# Patient Record
Sex: Male | Born: 1945 | Race: White | Hispanic: No | Marital: Married | Smoking: Former smoker
Health system: Southern US, Community
[De-identification: ages and names within clinical notes are randomized; demographics above are authoritative.]

## PROBLEM LIST (undated history)

## (undated) DIAGNOSIS — I2510 Atherosclerotic heart disease of native coronary artery without angina pectoris: Secondary | ICD-10-CM

## (undated) DIAGNOSIS — I272 Pulmonary hypertension, unspecified: Secondary | ICD-10-CM

## (undated) DIAGNOSIS — E663 Overweight: Secondary | ICD-10-CM

## (undated) DIAGNOSIS — E785 Hyperlipidemia, unspecified: Secondary | ICD-10-CM

## (undated) DIAGNOSIS — F411 Generalized anxiety disorder: Secondary | ICD-10-CM

## (undated) HISTORY — PX: SKIN CANCER EXCISION: SHX779

## (undated) HISTORY — DX: Atherosclerotic heart disease of native coronary artery without angina pectoris: I25.10

## (undated) HISTORY — DX: Hyperlipidemia, unspecified: E78.5

## (undated) HISTORY — DX: Overweight: E66.3

## (undated) HISTORY — PX: ANAL FISSURE REPAIR: SHX2312

## (undated) HISTORY — DX: Generalized anxiety disorder: F41.1

---
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
## 2003-09-01 HISTORY — PX: CARDIAC CATHETERIZATION: SHX172

## 2003-12-19 ENCOUNTER — Ambulatory Visit (HOSPITAL_COMMUNITY): Admission: RE | Admit: 2003-12-19 | Discharge: 2003-12-19 | Payer: Self-pay | Admitting: *Deleted

## 2011-09-15 ENCOUNTER — Ambulatory Visit (INDEPENDENT_AMBULATORY_CARE_PROVIDER_SITE_OTHER): Payer: Medicare Other | Admitting: General Surgery

## 2011-09-28 ENCOUNTER — Encounter (INDEPENDENT_AMBULATORY_CARE_PROVIDER_SITE_OTHER): Payer: Self-pay | Admitting: General Surgery

## 2011-09-28 ENCOUNTER — Ambulatory Visit (INDEPENDENT_AMBULATORY_CARE_PROVIDER_SITE_OTHER): Payer: Medicare Other | Admitting: General Surgery

## 2011-09-28 VITALS — BP 150/74 | HR 68 | Temp 98.1°F | Ht 73.0 in | Wt 213.4 lb

## 2011-09-28 DIAGNOSIS — K429 Umbilical hernia without obstruction or gangrene: Secondary | ICD-10-CM

## 2011-09-28 NOTE — Progress Notes (Signed)
Patient ID: [name], male   DOB: 01-Feb-1946, 66 y.o.   MRN: [id number]  Chief Complaint  Patient presents with  . Pre-op Exam    eval umb hernia    HPI [name] is a 66 y.o. male.  Referred by Dr. Melinda Crutch HPI This is a 66 year old male who has about a 1-1/2 year history of an umbilical bulge. This causes him absolutely no symptoms except that is present. He has no discomfort associated with this. It is always out. He has no changes in his bowel movements or any nausea or vomiting either. He comes in to have this evaluated as he is about to go on a cruise in a month and a half and is concerned about that.  Past Medical History  Diagnosis Date  . CAD (coronary artery disease)   . GAD (generalized anxiety disorder)   . Hyperlipidemia   . Overweight     Past Surgical History  Procedure Date  . Tonsillectomy 1955  . Cardiac catheterization 2005  . Anal fissure repair   . Skin cancer excision     Family History  Problem Relation Age of Onset  . Cancer Sister     breast    Social History History  Substance Use Topics  . Smoking status: Former Smoker    Quit date: 09/27/1985  . Smokeless tobacco: Not on file  . Alcohol Use: Yes    Allergies  Allergen Reactions  . Penicillins Hives    Current Outpatient Prescriptions  Medication Sig Dispense Refill  . ALPRAZolam (XANAX) 0.5 MG tablet 0.5 mg as needed. When he travels      . aspirin 81 MG tablet Take 160 mg by mouth daily.      . diphenhydrAMINE (BENADRYL) 50 MG capsule Take 50 mg by mouth as needed.      . fish oil-omega-3 fatty acids 1000 MG capsule Take 2 g by mouth daily.      . Melatonin 10 MG TABS Take 10 mg by mouth daily.      . multivitamin-iron-minerals-folic acid (CENTRUM) chewable tablet Chew 1 tablet by mouth daily.      . simvastatin (ZOCOR) 40 MG tablet Take 40 mg by mouth every evening.        Review of Systems Review of Systems  Constitutional: Negative for fever, chills and unexpected  weight change.  HENT: Negative for hearing loss, congestion, sore throat, trouble swallowing and voice change.   Eyes: Negative for visual disturbance.  Respiratory: Negative for cough and wheezing.   Cardiovascular: Negative for chest pain, palpitations and leg swelling.  Gastrointestinal: Negative for nausea, vomiting, abdominal pain, diarrhea, constipation, blood in stool, abdominal distention, anal bleeding and rectal pain.  Genitourinary: Negative for hematuria and difficulty urinating.  Musculoskeletal: Negative for arthralgias.  Skin: Negative for rash and wound.  Neurological: Negative for seizures, syncope, weakness and headaches.  Hematological: Negative for adenopathy. Does not bruise/bleed easily.  Psychiatric/Behavioral: Negative for confusion.    Blood pressure 150/74, pulse 68, temperature 98.1 F (36.7 C), temperature source Temporal, height _0  (1.854 m), weight 213 lb 6.4 oz (96.798 kg), SpO2 94.00%.  Physical Exam Physical Exam  Vitals reviewed. Constitutional: He appears well-developed and well-nourished.  Neck: Neck supple.  Cardiovascular: Normal rate, regular rhythm and normal heart sounds.   Pulmonary/Chest: Effort normal and breath sounds normal. He has no wheezes. He has no rales.  Abdominal: Soft. There is no tenderness. A hernia (reducible nontender umbilical hernia about 1.5 cm in size)  is present.  Lymphadenopathy:    He has no cervical adenopathy.      Assessment Umbilical hernia    Plan    He has a fairly large umbilical hernia. We discussed the option of observation I recommended repair. He has a fair amount of what is likely preperitoneal fat in this with a smaller hole. I think he certainly is at risk for needing an urgent operation. We also discussed over the long-term this area would likely get bigger and causing more problems. He is very active I think the best plan would be to repair this now. We discussed an open umbilical hernia appear  at likely with mesh. The risks include but are not limited to bleeding, infection, recurrence. He will consider this and will call me back.       Pricilla Moehle 09/28/2011, 4:15 PM

## 2011-09-29 ENCOUNTER — Telehealth (INDEPENDENT_AMBULATORY_CARE_PROVIDER_SITE_OTHER): Payer: Self-pay | Admitting: General Surgery

## 2011-09-29 ENCOUNTER — Other Ambulatory Visit (INDEPENDENT_AMBULATORY_CARE_PROVIDER_SITE_OTHER): Payer: Self-pay | Admitting: General Surgery

## 2011-10-01 ENCOUNTER — Ambulatory Visit (INDEPENDENT_AMBULATORY_CARE_PROVIDER_SITE_OTHER): Payer: Medicare Other | Admitting: General Surgery

## 2011-10-02 ENCOUNTER — Encounter (HOSPITAL_COMMUNITY): Payer: Self-pay | Admitting: Pharmacy Technician

## 2011-10-02 ENCOUNTER — Encounter (HOSPITAL_COMMUNITY): Payer: Self-pay | Admitting: *Deleted

## 2011-10-02 NOTE — Pre-Procedure Instructions (Addendum)
PT INSTRUCTED BETASEPT SHOWER NIGHT BEFORE SURGERY AND AM OF SURGERY--NOT TO USE BETASEPT ON FACE, HEAD OR PRIVATE AREAS--MAY USE USUAL SOAP AND SHAMPOO THOSE AREAS. PT'S EKG REPORT AND CARDIOLOGY OFFICE NOTE 09/28/11 FROM DR. VARANASI --ON THIS CHART.

## 2011-10-05 ENCOUNTER — Telehealth (INDEPENDENT_AMBULATORY_CARE_PROVIDER_SITE_OTHER): Payer: Self-pay

## 2011-10-05 NOTE — Telephone Encounter (Signed)
Patient aware does not need to stop xanax prior to surgery.

## 2011-10-05 NOTE — Telephone Encounter (Signed)
no

## 2011-10-05 NOTE — Telephone Encounter (Signed)
Patient called and said he is having surgery on Wed and is having a bit of anxiety. He took xanax last night and thinks he will probably need to take one tonight. Is there a certain time before Wed Dr. Donne Hazel wants him to stop taking it?

## 2011-10-05 NOTE — Telephone Encounter (Signed)
Called patient back and left message for him to call.

## 2011-10-07 ENCOUNTER — Ambulatory Visit (HOSPITAL_COMMUNITY): Payer: Medicare Other

## 2011-10-07 ENCOUNTER — Observation Stay (HOSPITAL_COMMUNITY)
Admission: RE | Admit: 2011-10-07 | Discharge: 2011-10-08 | Disposition: A | Discharge status: Home / Self Care | Payer: Medicare Other | Source: Ambulatory Visit | Attending: General Surgery | Admitting: General Surgery

## 2011-10-07 ENCOUNTER — Ambulatory Visit (HOSPITAL_COMMUNITY): Payer: Medicare Other | Admitting: Anesthesiology

## 2011-10-07 ENCOUNTER — Encounter (HOSPITAL_COMMUNITY)
Admission: RE | Disposition: A | Discharge status: Home / Self Care | Payer: Self-pay | Source: Ambulatory Visit | Attending: General Surgery

## 2011-10-07 ENCOUNTER — Encounter (HOSPITAL_COMMUNITY): Payer: Self-pay | Admitting: *Deleted

## 2011-10-07 ENCOUNTER — Encounter (HOSPITAL_COMMUNITY): Payer: Self-pay | Admitting: Anesthesiology

## 2011-10-07 DIAGNOSIS — K429 Umbilical hernia without obstruction or gangrene: Principal | ICD-10-CM | POA: Insufficient documentation

## 2011-10-07 DIAGNOSIS — E785 Hyperlipidemia, unspecified: Secondary | ICD-10-CM | POA: Insufficient documentation

## 2011-10-07 DIAGNOSIS — R0902 Hypoxemia: Secondary | ICD-10-CM | POA: Insufficient documentation

## 2011-10-07 DIAGNOSIS — Z7982 Long term (current) use of aspirin: Secondary | ICD-10-CM | POA: Insufficient documentation

## 2011-10-07 DIAGNOSIS — Z01818 Encounter for other preprocedural examination: Secondary | ICD-10-CM | POA: Insufficient documentation

## 2011-10-07 DIAGNOSIS — J4489 Other specified chronic obstructive pulmonary disease: Secondary | ICD-10-CM | POA: Insufficient documentation

## 2011-10-07 DIAGNOSIS — I2510 Atherosclerotic heart disease of native coronary artery without angina pectoris: Secondary | ICD-10-CM | POA: Insufficient documentation

## 2011-10-07 DIAGNOSIS — Z79899 Other long term (current) drug therapy: Secondary | ICD-10-CM | POA: Insufficient documentation

## 2011-10-07 DIAGNOSIS — J449 Chronic obstructive pulmonary disease, unspecified: Secondary | ICD-10-CM | POA: Insufficient documentation

## 2011-10-07 DIAGNOSIS — I517 Cardiomegaly: Secondary | ICD-10-CM | POA: Insufficient documentation

## 2011-10-07 HISTORY — PX: UMBILICAL HERNIA REPAIR: SHX196

## 2011-10-07 LAB — SURGICAL PCR SCREEN: MRSA, PCR: INVALID — AB

## 2011-10-07 LAB — BASIC METABOLIC PANEL
CO2: 26 mEq/L (ref 19–32)
Calcium: 9.4 mg/dL (ref 8.4–10.5)
Creatinine, Ser: 0.89 mg/dL (ref 0.50–1.35)
GFR calc Af Amer: >90 mL/min (ref >90)
GFR calc non Af Amer: 88 mL/min — ABNORMAL LOW (ref >90)
Sodium: 140 mEq/L (ref 135–145)

## 2011-10-07 LAB — CBC
HCT: 44.7 % (ref 39.0–52.0)
Hemoglobin: 16.1 g/dL (ref 13.0–17.0)
MCH: 33.3 pg (ref 26.0–34.0)
MCHC: 36 g/dL (ref 30.0–36.0)
MCV: 92.5 fL (ref 78.0–100.0)
RBC: 4.83 MIL/uL (ref 4.22–5.81)
RDW: 12.9 % (ref 11.5–15.5)
WBC: 5.9 10*3/uL (ref 4.0–10.5)

## 2011-10-07 SURGERY — REPAIR, HERNIA, UMBILICAL, ADULT
Anesthesia: General | Site: Abdomen | Wound class: Clean

## 2011-10-07 MED ORDER — OXYCODONE HCL 5 MG PO TABS: 5.0000 mg | ORAL_TABLET | ORAL | Status: DC | PRN

## 2011-10-07 MED ORDER — ONDANSETRON HCL 4 MG/2ML IJ SOLN: 4.0000 mg | Freq: Four times a day (QID) | INTRAMUSCULAR | Status: DC | PRN

## 2011-10-07 MED ORDER — SODIUM CHLORIDE 0.9 % IV SOLN: 250.0000 mL | INTRAVENOUS | Status: DC | PRN

## 2011-10-07 MED ORDER — FUROSEMIDE 10 MG/ML IJ SOLN
10.0000 mg | Freq: Once | INTRAMUSCULAR | Status: AC
  Administered 2011-10-08: 10 mg via INTRAVENOUS
  Filled 2011-10-07: qty 1

## 2011-10-07 MED ORDER — VANCOMYCIN HCL IN DEXTROSE 1-5 GM/200ML-% IV SOLN: 1000.0000 mg | INTRAVENOUS | Status: DC

## 2011-10-07 MED ORDER — LIDOCAINE HCL (CARDIAC) 20 MG/ML IV SOLN
INTRAVENOUS | Status: DC | PRN
  Administered 2011-10-07: 80 mg via INTRAVENOUS

## 2011-10-07 MED ORDER — SODIUM CHLORIDE 0.9 % IV SOLN
INTRAVENOUS | Status: DC
  Administered 2011-10-07: 15:00:00 via INTRAVENOUS

## 2011-10-07 MED ORDER — FENTANYL CITRATE 0.05 MG/ML IJ SOLN: 25.0000 ug | INTRAMUSCULAR | Status: DC | PRN

## 2011-10-07 MED ORDER — LACTATED RINGERS IV SOLN
INTRAVENOUS | Status: DC | PRN
  Administered 2011-10-07: 10:00:00 via INTRAVENOUS

## 2011-10-07 MED ORDER — OXYCODONE-ACETAMINOPHEN 5-325 MG PO TABS: 1.0000 | ORAL_TABLET | ORAL | Status: AC | PRN

## 2011-10-07 MED ORDER — ACETAMINOPHEN 10 MG/ML IV SOLN
INTRAVENOUS | Status: DC | PRN
  Administered 2011-10-07: 1000 mg via INTRAVENOUS

## 2011-10-07 MED ORDER — MIDAZOLAM HCL 5 MG/5ML IJ SOLN
INTRAMUSCULAR | Status: DC | PRN
  Administered 2011-10-07: 2 mg via INTRAVENOUS

## 2011-10-07 MED ORDER — FENTANYL CITRATE 0.05 MG/ML IJ SOLN
INTRAMUSCULAR | Status: DC | PRN
  Administered 2011-10-07: 25 ug via INTRAVENOUS
  Administered 2011-10-07: 100 ug via INTRAVENOUS
  Administered 2011-10-07: 50 ug via INTRAVENOUS

## 2011-10-07 MED ORDER — LACTATED RINGERS IV SOLN: INTRAVENOUS | Status: DC

## 2011-10-07 MED ORDER — SODIUM CHLORIDE 0.9 % IJ SOLN: 3.0000 mL | Freq: Two times a day (BID) | INTRAMUSCULAR | Status: DC

## 2011-10-07 MED ORDER — MORPHINE SULFATE 2 MG/ML IJ SOLN: 2.0000 mg | INTRAMUSCULAR | Status: DC | PRN

## 2011-10-07 MED ORDER — ACETAMINOPHEN 325 MG PO TABS
650.0000 mg | ORAL_TABLET | ORAL | Status: DC | PRN
  Administered 2011-10-07: 650 mg via ORAL
  Filled 2011-10-07: qty 2

## 2011-10-07 MED ORDER — ACETAMINOPHEN 10 MG/ML IV SOLN
INTRAVENOUS | Status: AC
  Filled 2011-10-07: qty 100

## 2011-10-07 MED ORDER — ALBUTEROL SULFATE (5 MG/ML) 0.5% IN NEBU
INHALATION_SOLUTION | RESPIRATORY_TRACT | Status: AC
  Filled 2011-10-07: qty 0.5

## 2011-10-07 MED ORDER — OXYCODONE HCL 5 MG PO TABS: 5.0000 mg | ORAL_TABLET | Freq: Four times a day (QID) | ORAL | Status: DC | PRN

## 2011-10-07 MED ORDER — ROCURONIUM BROMIDE 100 MG/10ML IV SOLN
INTRAVENOUS | Status: DC | PRN
  Administered 2011-10-07: 30 mg via INTRAVENOUS
  Administered 2011-10-07: 10 mg via INTRAVENOUS

## 2011-10-07 MED ORDER — ACETAMINOPHEN 650 MG RE SUPP: 650.0000 mg | RECTAL | Status: DC | PRN

## 2011-10-07 MED ORDER — BUPIVACAINE HCL 0.25 % IJ SOLN
INTRAMUSCULAR | Status: DC | PRN
  Administered 2011-10-07: 20 mL

## 2011-10-07 MED ORDER — GLYCOPYRROLATE 0.2 MG/ML IJ SOLN
INTRAMUSCULAR | Status: DC | PRN
  Administered 2011-10-07: .6 mg via INTRAVENOUS

## 2011-10-07 MED ORDER — PROMETHAZINE HCL 25 MG/ML IJ SOLN: 12.5000 mg | Freq: Four times a day (QID) | INTRAMUSCULAR | Status: DC | PRN

## 2011-10-07 MED ORDER — NEOSTIGMINE METHYLSULFATE 1 MG/ML IJ SOLN
INTRAMUSCULAR | Status: DC | PRN
  Administered 2011-10-07: 4 mg via INTRAVENOUS

## 2011-10-07 MED ORDER — ALBUTEROL SULFATE (5 MG/ML) 0.5% IN NEBU
2.5000 mg | INHALATION_SOLUTION | Freq: Once | RESPIRATORY_TRACT | Status: AC
  Administered 2011-10-07: 2.5 mg via RESPIRATORY_TRACT

## 2011-10-07 MED ORDER — SODIUM CHLORIDE 0.9 % IV SOLN: INTRAVENOUS | Status: DC

## 2011-10-07 MED ORDER — PROPOFOL 10 MG/ML IV EMUL
INTRAVENOUS | Status: DC | PRN
  Administered 2011-10-07: 200 mg via INTRAVENOUS

## 2011-10-07 MED ORDER — SODIUM CHLORIDE 0.9 % IJ SOLN: 3.0000 mL | INTRAMUSCULAR | Status: DC | PRN

## 2011-10-07 MED ORDER — SUCCINYLCHOLINE CHLORIDE 20 MG/ML IJ SOLN
INTRAMUSCULAR | Status: DC | PRN
  Administered 2011-10-07: 100 mg via INTRAVENOUS

## 2011-10-07 MED ORDER — MORPHINE SULFATE 10 MG/ML IJ SOLN: 2.0000 mg | INTRAMUSCULAR | Status: DC | PRN

## 2011-10-07 MED ORDER — ONDANSETRON HCL 4 MG/2ML IJ SOLN
INTRAMUSCULAR | Status: DC | PRN
  Administered 2011-10-07: 4 mg via INTRAVENOUS

## 2011-10-07 MED ORDER — ACETAMINOPHEN 325 MG PO TABS
650.0000 mg | ORAL_TABLET | Freq: Four times a day (QID) | ORAL | Status: DC | PRN
  Administered 2011-10-08: 650 mg via ORAL
  Filled 2011-10-07: qty 2

## 2011-10-07 MED ORDER — VANCOMYCIN HCL IN DEXTROSE 1-5 GM/200ML-% IV SOLN
INTRAVENOUS | Status: AC
  Filled 2011-10-07: qty 200

## 2011-10-07 MED ORDER — PROMETHAZINE HCL 25 MG/ML IJ SOLN: 6.2500 mg | INTRAMUSCULAR | Status: DC | PRN

## 2011-10-07 MED ORDER — LACTATED RINGERS IV SOLN
INTRAVENOUS | Status: DC
  Administered 2011-10-07: 1000 mL via INTRAVENOUS

## 2011-10-07 MED ORDER — VANCOMYCIN HCL 1000 MG IV SOLR
1000.0000 mg | INTRAVENOUS | Status: DC | PRN
  Administered 2011-10-07: 1000 mg via INTRAVENOUS

## 2011-10-07 MED ORDER — ACETAMINOPHEN 650 MG RE SUPP: 650.0000 mg | Freq: Four times a day (QID) | RECTAL | Status: DC | PRN

## 2011-10-07 SURGICAL SUPPLY — 46 items
BENZOIN TINCTURE PRP APPL 2/3 (GAUZE/BANDAGES/DRESSINGS) ×3 IMPLANT
BLADE HEX COATED 2.75 (ELECTRODE) ×3 IMPLANT
BLADE SURG 15 STRL LF DISP TIS (BLADE) ×2 IMPLANT
BLADE SURG 15 STRL SS (BLADE) ×1
BLADE SURG SZ10 CARB STEEL (BLADE) ×3 IMPLANT
CANISTER SUCTION 2500CC (MISCELLANEOUS) ×3 IMPLANT
CLOTH BEACON ORANGE TIMEOUT ST (SAFETY) ×3 IMPLANT
DECANTER SPIKE VIAL GLASS SM (MISCELLANEOUS) ×3 IMPLANT
DERMABOND ADVANCED (GAUZE/BANDAGES/DRESSINGS)
DERMABOND ADVANCED .7 DNX12 (GAUZE/BANDAGES/DRESSINGS) IMPLANT
DRAIN PENROSE 18X1/2 LTX STRL (DRAIN) IMPLANT
DRAPE LAPAROSCOPIC ABDOMINAL (DRAPES) ×3 IMPLANT
DRSG TEGADERM 4X4.75 (GAUZE/BANDAGES/DRESSINGS) ×3 IMPLANT
ELECT REM PT RETURN 9FT ADLT (ELECTROSURGICAL) ×3
ELECTRODE REM PT RTRN 9FT ADLT (ELECTROSURGICAL) ×2 IMPLANT
GAUZE SPONGE 2X2 8PLY STRL LF (GAUZE/BANDAGES/DRESSINGS) ×2 IMPLANT
GLOVE BIO SURGEON STRL SZ7 (GLOVE) ×9 IMPLANT
GLOVE BIOGEL PI IND STRL 7.0 (GLOVE) ×2 IMPLANT
GLOVE BIOGEL PI IND STRL 7.5 (GLOVE) ×6 IMPLANT
GLOVE BIOGEL PI INDICATOR 7.0 (GLOVE) ×1
GLOVE BIOGEL PI INDICATOR 7.5 (GLOVE) ×3
GOWN PREVENTION PLUS LG XLONG (DISPOSABLE) ×3 IMPLANT
GOWN PREVENTION PLUS XLARGE (GOWN DISPOSABLE) ×6 IMPLANT
GOWN STRL NON-REIN LRG LVL3 (GOWN DISPOSABLE) ×3 IMPLANT
GOWN STRL REIN XL XLG (GOWN DISPOSABLE) ×6 IMPLANT
KIT BASIN OR (CUSTOM PROCEDURE TRAY) ×3 IMPLANT
NEEDLE HYPO 25X1 1.5 SAFETY (NEEDLE) ×3 IMPLANT
NS IRRIG 1000ML POUR BTL (IV SOLUTION) ×3 IMPLANT
PACK BASIC VI WITH GOWN DISP (CUSTOM PROCEDURE TRAY) ×3 IMPLANT
PATCH VENTRAL MEDIUM 6.4 (Mesh Specialty) ×3 IMPLANT
PENCIL BUTTON HOLSTER BLD 10FT (ELECTRODE) ×3 IMPLANT
SPONGE GAUZE 2X2 STER 10/PKG (GAUZE/BANDAGES/DRESSINGS) ×1
SPONGE GAUZE 4X4 12PLY (GAUZE/BANDAGES/DRESSINGS) ×3 IMPLANT
SPONGE LAP 18X18 X RAY DECT (DISPOSABLE) ×3 IMPLANT
STRIP CLOSURE SKIN 1/2X4 (GAUZE/BANDAGES/DRESSINGS) ×3 IMPLANT
SUT ETHIBOND NAB CT1 #1 30IN (SUTURE) ×9 IMPLANT
SUT MNCRL AB 4-0 PS2 18 (SUTURE) ×3 IMPLANT
SUT SILK 2 0 SH (SUTURE) IMPLANT
SUT VIC AB 2-0 SH 27 (SUTURE) ×2
SUT VIC AB 2-0 SH 27X BRD (SUTURE) ×4 IMPLANT
SUT VIC AB 3-0 SH 27 (SUTURE) ×1
SUT VIC AB 3-0 SH 27XBRD (SUTURE) ×2 IMPLANT
SYR BULB IRRIGATION 50ML (SYRINGE) ×3 IMPLANT
SYR CONTROL 10ML LL (SYRINGE) ×3 IMPLANT
TOWEL OR 17X26 10 PK STRL BLUE (TOWEL DISPOSABLE) ×3 IMPLANT
YANKAUER SUCT BULB TIP 10FT TU (MISCELLANEOUS) ×3 IMPLANT

## 2011-10-07 NOTE — Progress Notes (Signed)
Have seen patient and reviewed vitals.  He is asymptomatic and requiring o2.  He is not tachycardic and is without chest pain.  Will plan on monitoring tonight, attempt to wean off tomorrow. If unable then may need to further evaluate.

## 2011-10-07 NOTE — Transfer of Care (Signed)
Immediate Anesthesia Transfer of Care Note  Patient: [name]  Procedure(s) Performed:  HERNIA REPAIR UMBILICAL ADULT - with mesh ; INSERTION OF MESH  Patient Location: PACU  Anesthesia Type: General  Level of Consciousness: awake and alert   Airway & Oxygen Therapy: Patient Spontanous Breathing  Post-op Assessment: Report given to PACU RN and Post -op Vital signs reviewed and stable  Post vital signs: Reviewed and stable  Complications: No apparent anesthesia complications

## 2011-10-07 NOTE — Anesthesia Postprocedure Evaluation (Signed)
Anesthesia Post Note  Patient: [name]  Procedure(s) Performed:  HERNIA REPAIR UMBILICAL ADULT - with mesh ; INSERTION OF MESH  Anesthesia type: General  Patient location: PACU  Post pain: Pain level controlled  Post assessment: Post-op Vital signs reviewed  Last Vitals:  Filed Vitals:   10/07/11 1130  BP: 144/81  Pulse:   Temp: 36.9 C  Resp:     Post vital signs: Reviewed  Level of consciousness: sedated  Complications: No apparent anesthesia complications

## 2011-10-07 NOTE — Anesthesia Preprocedure Evaluation (Addendum)
Anesthesia Evaluation  Patient identified by MRN, date of birth, ID band Patient awake    Reviewed: Allergy & Precautions, H&P , NPO status , Patient's Chart, lab work & pertinent test results  History of Anesthesia Complications Negative for: history of anesthetic complications  Airway Mallampati: II TM Distance: >3 FB     Dental  (+) Teeth Intact and Dental Advisory Given,    Pulmonary neg pulmonary ROS,  clear to auscultation  Pulmonary exam normal       Cardiovascular + CAD (Angioplasty in remote past; good exercise tolerance ) and neg cardio ROS Regular Normal    Neuro/Psych PSYCHIATRIC DISORDERS Anxiety Negative Neurological ROS     GI/Hepatic negative GI ROS, Neg liver ROS,   Endo/Other  Negative Endocrine ROS  Renal/GU negative Renal ROS  Genitourinary negative   Musculoskeletal negative musculoskeletal ROS (+)   Abdominal   Peds negative pediatric ROS (+)  Hematology negative hematology ROS (+)   Anesthesia Other Findings   Reproductive/Obstetrics negative OB ROS                          Anesthesia Physical Anesthesia Plan  ASA: III  Anesthesia Plan: General   Post-op Pain Management:    Induction: Intravenous  Airway Management Planned: Oral ETT  Additional Equipment:   Intra-op Plan:   Post-operative Plan: Extubation in OR  Informed Consent: I have reviewed the patients History and Physical, chart, labs and discussed the procedure including the risks, benefits and alternatives for the proposed anesthesia with the patient or authorized representative who has indicated his/her understanding and acceptance.   Dental advisory given  Plan Discussed with: CRNA  Anesthesia Plan Comments:         Anesthesia Quick Evaluation

## 2011-10-07 NOTE — H&P (View-Only) (Signed)
Patient ID: [name], male   DOB: 01/05/1946, 65 y.o.   MRN: [id number]  Chief Complaint  Patient presents with  . Pre-op Exam    eval umb hernia    HPI [name] is a 65 y.o. male.  Referred by Dr. Alan Ross HPI This is a 65-year-old male who has about a 1-1/2 year history of an umbilical bulge. This causes him absolutely no symptoms except that is present. He has no discomfort associated with this. It is always out. He has no changes in his bowel movements or any nausea or vomiting either. He comes in to have this evaluated as he is about to go on a cruise in a month and a half and is concerned about that.  Past Medical History  Diagnosis Date  . CAD (coronary artery disease)   . GAD (generalized anxiety disorder)   . Hyperlipidemia   . Overweight     Past Surgical History  Procedure Date  . Tonsillectomy 1955  . Cardiac catheterization 2005  . Anal fissure repair   . Skin cancer excision     Family History  Problem Relation Age of Onset  . Cancer Sister     breast    Social History History  Substance Use Topics  . Smoking status: Former Smoker    Quit date: 09/27/1985  . Smokeless tobacco: Not on file  . Alcohol Use: Yes    Allergies  Allergen Reactions  . Penicillins Hives    Current Outpatient Prescriptions  Medication Sig Dispense Refill  . ALPRAZolam (XANAX) 0.5 MG tablet 0.5 mg as needed. When he travels      . aspirin 81 MG tablet Take 160 mg by mouth daily.      . diphenhydrAMINE (BENADRYL) 50 MG capsule Take 50 mg by mouth as needed.      . fish oil-omega-3 fatty acids 1000 MG capsule Take 2 g by mouth daily.      . Melatonin 10 MG TABS Take 10 mg by mouth daily.      . multivitamin-iron-minerals-folic acid (CENTRUM) chewable tablet Chew 1 tablet by mouth daily.      . simvastatin (ZOCOR) 40 MG tablet Take 40 mg by mouth every evening.        Review of Systems Review of Systems  Constitutional: Negative for fever, chills and unexpected  weight change.  HENT: Negative for hearing loss, congestion, sore throat, trouble swallowing and voice change.   Eyes: Negative for visual disturbance.  Respiratory: Negative for cough and wheezing.   Cardiovascular: Negative for chest pain, palpitations and leg swelling.  Gastrointestinal: Negative for nausea, vomiting, abdominal pain, diarrhea, constipation, blood in stool, abdominal distention, anal bleeding and rectal pain.  Genitourinary: Negative for hematuria and difficulty urinating.  Musculoskeletal: Negative for arthralgias.  Skin: Negative for rash and wound.  Neurological: Negative for seizures, syncope, weakness and headaches.  Hematological: Negative for adenopathy. Does not bruise/bleed easily.  Psychiatric/Behavioral: Negative for confusion.    Blood pressure 150/74, pulse 68, temperature 98.1 F (36.7 C), temperature source Temporal, height 6' 1" (1.854 m), weight 213 lb 6.4 oz (96.798 kg), SpO2 94.00%.  Physical Exam Physical Exam  Vitals reviewed. Constitutional: He appears well-developed and well-nourished.  Neck: Neck supple.  Cardiovascular: Normal rate, regular rhythm and normal heart sounds.   Pulmonary/Chest: Effort normal and breath sounds normal. He has no wheezes. He has no rales.  Abdominal: Soft. There is no tenderness. A hernia (reducible nontender umbilical hernia about 1.5 cm in size)   is present.  Lymphadenopathy:    He has no cervical adenopathy.      Assessment Umbilical hernia    Plan    He has a fairly large umbilical hernia. We discussed the option of observation I recommended repair. He has a fair amount of what is likely preperitoneal fat in this with a smaller hole. I think he certainly is at risk for needing an urgent operation. We also discussed over the long-term this area would likely get bigger and causing more problems. He is very active I think the best plan would be to repair this now. We discussed an open umbilical hernia appear  at likely with mesh. The risks include but are not limited to bleeding, infection, recurrence. He will consider this and will call me back.       Renna Kilmer 09/28/2011, 4:15 PM

## 2011-10-07 NOTE — OR Nursing (Signed)
Pt cont to have O2 saturations decreasing to low 80's intermittently, pt fully awake, mae x 4, alert and oriented, BS, clear bilaterly, w/ slight diminished sounds in bases, IS initiated, pt has good effort able to pull 1500-2044m's, pt denies any SOB, resp even and non-labored, MDA notified of current issue

## 2011-10-07 NOTE — Interval H&P Note (Signed)
History and Physical Interval Note:  10/07/2011 10:01 AM  [name]  has presented today for surgery, with the diagnosis of umbilical hernia   The various methods of treatment have been discussed with the patient and family. After consideration of risks, benefits and other options for treatment, the patient has consented to  Procedure(s): Umbilical hernia repair with mesh as a surgical intervention .  The patients' history has been reviewed, patient examined, no change in status, stable for surgery.  I have reviewed the patients' chart and labs.  Questions were answered to the patient's satisfaction.     [name]

## 2011-10-07 NOTE — Progress Notes (Signed)
Surgeon made aware of patients diminished SaO2 and need for supplemental O2 support. No significant findings on physical exam. Suspect atelectasis on top of history of chronic bronchial changes and COPD. Patient will be admitted for 24 hour observation. A Kewon Statler MD

## 2011-10-07 NOTE — Op Note (Signed)
Preoperative diagnosis: Symptomatic umbilical hernia Postoperative diagnosis: Same as above Procedure: Umbilical hernia repair with 6.4 cm proceed ventral patch Surgeon: Dr. Serita Grammes Anesthesia: Gen. Endotracheal Specimens: None Drains: None Complications: None Estimated blood loss: Minimal Sponge and needle count correct x2 and of operation Disposition patient to recovery room in stable condition  Indications: This is a 66 year old male who presented to me with a increasingly symptomatic umbilical hernia. We discussed open repair and the risks and benefits associated with that.  Procedure: After informed consent was obtained, the patient was taken to the operating room. He was administered vancomycin due to a penicillin allergy and the likelihood of placement of mesh. Sequential compression devices were in place prior to beginning. He was in place under general endotracheal anesthesia without complication. His abdomen was then prepped and draped in the standard sterile surgical fashion. A surgical timeout was performed.  I made a curvilinear incision below his umbilicus. This was carried out to his umbilical stalk. This was encircled with a Kelly clamp. This was then divided in this hernia sac was entered. This hernia sac was very adherent to his umbilicus. There was some preperitoneal fat as well as some omentum in his hernia sac that was present. Eventually I was able to reduce his hernia in its entirety. I did excise some of his hernia sac as it was very redundant. The preperitoneal space was really not able to be entered so I placed the Proceed ventral patch intraperitoneally. I pulled the connecting straps up and laid the bottom portion flat. I sutured this into position to the fascial edges with 0 Ethibond suture. I then tacked the ends down. I also closed the fascia along with this. This appeared to be adequate upon completion. Hemostasis was observed. I then irrigated the area. I then  tacked the umbilicus down with a 3-0 undyed Vicryl. I then closed the skin with 3-0 Vicryl and 4 Monocryl. Steri-Strips and sterile dressing were placed. 20 cc of quarter percent Marcaine were infiltrated throughout the area. He tolerated this well transferred to recovery room in stable condition.

## 2011-10-08 MED ORDER — ALPRAZOLAM 0.5 MG PO TABS: 0.5000 mg | ORAL_TABLET | Freq: Three times a day (TID) | ORAL | Status: DC | PRN

## 2011-10-08 MED ORDER — ASPIRIN EC 81 MG PO TBEC
81.0000 mg | DELAYED_RELEASE_TABLET | Freq: Every day | ORAL | Status: DC
Start: 2011-10-08 — End: 2011-10-08
  Administered 2011-10-08: 81 mg via ORAL
  Filled 2011-10-08: qty 1

## 2011-10-08 NOTE — Progress Notes (Signed)
Patient given discharge instructions and prescription. Verbalized understanding. Iv site removed, catheter tip intact. C/o minor discomfort  Relieved with tylenol. Tolerating diet. Ambulating. Left unit in wheelchair accompanied by staff

## 2011-10-08 NOTE — Progress Notes (Signed)
1 Day Post-Op  Subjective: No complaints, he is asx and sats come up easily with deep breath, no pain, tol diet  Objective: Vital signs in last 24 hours: Temp:  [97 F (36.1 C)-98.5 F (36.9 C)] 97.4 F (36.3 C) (02/07 0200) Pulse Rate:  [52-78] 60  (02/07 0200) Resp:  [12-20] 16  (02/07 0200) BP: (119-156)/(73-89) 150/77 mmHg (02/07 0200) SpO2:  [91 %-100 %] 93 % (02/07 0200) Weight:  [212 lb (96.163 kg)] 212 lb (96.163 kg) (02/06 1430) Last BM Date: 10/07/11  Intake/Output from previous day: 02/06 0701 - 02/07 0700 In: 2245 [P.O.:440; I.V.:1805] Out: 2025 [Urine:2025] Intake/Output this shift: Total I/O In: 505 [I.V.:505] Out: 1425 [Urine:1425]  General appearance: no distress Resp: diminished breath sounds bibasilar Cardio: regular rate and rhythm, S1, S2 normal, no murmur, click, rub or gallop GI: dressing dry appropr tender  Lab Results:   Basename 10/07/11 0815  WBC 5.9  HGB 16.1  HCT 44.7  PLT 110*   BMET  Basename 10/07/11 0815  NA 140  K 4.2  CL 104  CO2 26  GLUCOSE 109*  BUN 12  CREATININE 0.89  CALCIUM 9.4   PT/INR No results found for this basename: LABPROT:2,INR:2 in the last 72 hours ABG No results found for this basename: PHART:2,PCO2:2,PO2:2,HCO3:2 in the last 72 hours  Studies/Results: Dg Chest 2 View  10/07/2011  *RADIOLOGY REPORT*  Clinical Data: Preop for repair of umbilical hernia  CHEST - 2 VIEW  Comparison: Chest x-ray of 12/19/2003  Findings: The lungs are clear but hyperaerated.  There is peribronchial thickening consistent with bronchitis, most likely chronic.  The heart is within upper limits of normal.  No acute bony abnormality is seen.  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  No definite active process.  Original Report Authenticated By: Joretta Bachelor, M.D.   Dg Chest Port 1 View  10/07/2011  *RADIOLOGY REPORT*  Clinical Data: Hypoxia  PORTABLE CHEST - 1 VIEW  Comparison: Chest radiograph 10/07/2011  Findings: Heart  silhouette is mildly enlarged.  There is central venous pulmonary congestion which is slightly increased compared to prior.  There is a mild interstitial edema pattern and the lower lobes. No new  IMPRESSION: Cardiomegaly and mild interstitial edema appears increased.  Original Report Authenticated By: Suzy Bouchard, M.D.    Anti-infectives: Anti-infectives     Start     Dose/Rate Route Frequency Ordered Stop   10/07/11 0745   vancomycin (VANCOCIN) IVPB 1000 mg/200 mL premix  Status:  Discontinued        1,000 mg 200 mL/hr over 60 Minutes Intravenous 120 min pre-op 10/07/11 0739 10/07/11 1426          Assessment/Plan: POD #1 UH repair with mesh  1. Cont po pain meds 2. Pulm toilet, o2 off this am and will dc if does well.  I think likely secondary to ex-smoker and geta.  I do not think he has vte with other clinical findings.If does not do well, will ask medicine to see 3. Reg diet 4. SCDS   LOS: 1 day    [facility] 10/08/2011

## 2011-10-09 LAB — MRSA CULTURE

## 2011-10-09 MED FILL — Mupirocin Oint 2%: CUTANEOUS | Qty: 22 | Status: AC

## 2011-10-12 NOTE — Discharge Summary (Signed)
Physician Discharge Summary  Patient ID: [name] MRN: [id number] DOB/AGE: [birth year] 66 y.o.  Admit date: 10/07/2011 Discharge date: 10/12/2011  Admission Diagnoses: Umbilical hernia  Discharge Diagnoses:  Active Problems:  * No active hospital problems. *    Discharged Condition: good  Hospital Course: 87 yom who presented with symptomatic umbilical hernia.  He was admitted and underwent an open umbilical hernia repair with mesh.  Postoperatively he had difficulty maintaining his oxygen saturations.  He was otherwise well with a fairly normal chest xray.  He remained overnight and the following day his oxygen saturations were much improved.  He was discharged home.  Consults: None  Treatments: surgery: UH with PVP    Disposition: Home or Self Care   Medication List  As of 10/12/2011 12:31 PM   TAKE these medications         ALPRAZolam 0.5 MG tablet   Commonly known as: XANAX   Take 0.5 mg by mouth as needed. When he travels      aspirin 81 MG tablet   Take 81 mg by mouth daily.      DOXYLAMINE SUCCINATE PO   Take 1 tablet by mouth at bedtime as needed. For sleep      Fish Oil 1200 MG Caps   Take 1 capsule by mouth 4 (four) times daily.      GREEN TEA PO   Take 1 capsule by mouth daily.      Melatonin 10 MG Tabs   Take 10 mg by mouth daily.      multivitamin-iron-minerals-folic acid chewable tablet   Chew 1 tablet by mouth 2 (two) times daily.      OVER THE COUNTER MEDICATION   BORAGE OIL  ONCE A  DAILY        oxyCODONE-acetaminophen 5-325 MG per tablet   Commonly known as: PERCOCET   Take 1 tablet by mouth every 4 (four) hours as needed for pain.      PROBIOTIC COMPLEX ACIDOPHILUS PO   Take 1 capsule by mouth daily.      simvastatin 40 MG tablet   Commonly known as: ZOCOR   Take 40 mg by mouth every evening.           Follow-up Information    Follow up with [facility], MD. Schedule an appointment as soon as possible for a visit in 3  weeks.   Contact information:   [employer] [employer], [place] [phone]          Signed: Rolm Bookbinder 10/12/2011, 12:31 PM

## 2011-10-30 ENCOUNTER — Encounter (HOSPITAL_COMMUNITY): Payer: Self-pay | Admitting: General Surgery

## 2011-11-03 ENCOUNTER — Encounter (INDEPENDENT_AMBULATORY_CARE_PROVIDER_SITE_OTHER): Payer: Self-pay | Admitting: General Surgery

## 2011-11-03 ENCOUNTER — Ambulatory Visit (INDEPENDENT_AMBULATORY_CARE_PROVIDER_SITE_OTHER): Payer: Medicare Other | Admitting: General Surgery

## 2011-11-03 VITALS — BP 122/78 | HR 56 | Temp 97.2°F | Resp 12 | Ht 72.25 in | Wt 216.8 lb

## 2011-11-03 DIAGNOSIS — Z09 Encounter for follow-up examination after completed treatment for conditions other than malignant neoplasm: Secondary | ICD-10-CM

## 2011-11-03 NOTE — Progress Notes (Signed)
Subjective:     Patient ID: FRANSISCO MESSMER, male   DOB: 03/10/1946, 66 y.o.   MRN: [id number]  HPI This is a 66 year old male who I performed an open umbilical hernia repair almost a month ago now. He has done well from the standpoint of his umbilical hernia repair and is slowly returning to his normal activity. He complains of no pain and is having normal bowel function. Immediately postoperatively from his umbilical hernia repair he was kept in the hospital overnight to do difficulty with his oxygen saturations. There were no other objective abnormalities that were noted this improved somewhat over some time. He was discharged the following day doing very well.  Review of Systems     Objective:   Physical Exam Well healed umbilical incision without infection    Assessment:     S/p UH repair with mesh    Plan:     I released him to full activity today and will follow up as needed. We talked for a while about oxygen saturation and I think it was just from general anesthesia and some compromised lung function that is not apparent in his daily active life.

## 2013-07-13 ENCOUNTER — Encounter: Payer: Self-pay | Admitting: Interventional Cardiology

## 2013-07-23 ENCOUNTER — Other Ambulatory Visit: Payer: Self-pay | Admitting: Interventional Cardiology

## 2013-07-24 ENCOUNTER — Other Ambulatory Visit: Payer: Self-pay | Admitting: Interventional Cardiology

## 2013-07-24 ENCOUNTER — Other Ambulatory Visit: Payer: Self-pay | Admitting: *Deleted

## 2013-07-24 DIAGNOSIS — E782 Mixed hyperlipidemia: Secondary | ICD-10-CM

## 2013-07-24 DIAGNOSIS — Z79899 Other long term (current) drug therapy: Secondary | ICD-10-CM

## 2013-09-04 ENCOUNTER — Other Ambulatory Visit: Payer: Medicare Other

## 2013-10-11 ENCOUNTER — Ambulatory Visit (INDEPENDENT_AMBULATORY_CARE_PROVIDER_SITE_OTHER): Payer: Medicare Other | Admitting: Podiatrist

## 2013-10-11 ENCOUNTER — Encounter: Payer: Self-pay | Admitting: Podiatrist

## 2013-10-11 VITALS — BP 143/74 | HR 65 | Resp 12

## 2013-10-11 DIAGNOSIS — Q828 Other specified congenital malformations of skin: Secondary | ICD-10-CM

## 2013-10-11 DIAGNOSIS — M216X9 Other acquired deformities of unspecified foot: Secondary | ICD-10-CM

## 2013-10-12 ENCOUNTER — Encounter: Payer: Self-pay | Admitting: Podiatrist

## 2013-10-12 NOTE — Progress Notes (Signed)
Chief Complaint  Patient presents with  . Callouses    '' LT FOOT HAVE CALLUS AND NEED TO BE TRIM.''     HPI: Patient is 68 y.o. male who presents today for a painful callus submetatarsal 5 of the left foot.  It becomes painful when he walks and we have trimmed it out on multiple occasions.  He also has Raynauds Disease and is experiencing some discomfort in the toes 2 and 3 of the right foot.     Physical Exam  GENERAL APPEARANCE: Alert, conversant. Appropriately groomed. No acute distress.  VASCULAR: Pedal pulses palpable and strong bilateral.  mildy ruborous appearance to the distal digits is seen.  Toes are cool to touch.  Digital hair growth is present NEUROLOGIC: sensation is intact epicritically and protectively to 5.07 monofilament at 5/5 sites bilateral.  Light touch is intact bilateral, vibratory sensation intact bilateral, achilles tendon reflex is intact bilateral. MUSCULOSKELETAL: acceptable muscle strength, tone and stability bilateral.  Intrinsic muscluature intact bilateral.  Rectus appearance of foot and digits noted bilateral. Prominent 5th metatarsal head left with decreased fat pad overlying is noted.    DERMATOLOGIC: again mildly pinkish tone to the distal digits is seen consistent with Raynauds.  No open lesions are present.  submeatarsal 5 of the left foot has a porokeratotic lesion with a ground glass appearance.  There is intact integument underlying.  A small core is excised at today's visit.    Assessment: porokeratosis, prominent metatarsal head, raynauds  Plan: careful debridement of the porokeratotic lesion was carried out at todays visit with a 15 blade without complication.  For the sensations on the toes and Raynauds, I suggested trying a compounded topical medication to increase blood flow and assist with circulation to the digits.  This will be prescibed through [place].  He will let me know if there are any problems obtaining the medication.     Myley Bahner P

## 2013-10-15 DIAGNOSIS — E785 Hyperlipidemia, unspecified: Secondary | ICD-10-CM | POA: Insufficient documentation

## 2013-10-15 DIAGNOSIS — E663 Overweight: Secondary | ICD-10-CM | POA: Insufficient documentation

## 2013-10-15 DIAGNOSIS — F411 Generalized anxiety disorder: Secondary | ICD-10-CM | POA: Insufficient documentation

## 2013-10-15 DIAGNOSIS — I2510 Atherosclerotic heart disease of native coronary artery without angina pectoris: Secondary | ICD-10-CM | POA: Insufficient documentation

## 2013-10-16 NOTE — Progress Notes (Unsigned)
Dr Valentina Lucks ordered pain relief cream containing Verapamil 15%, and Lidocaine 2% dispense 120grams with 3 refills faxed to [facility].

## 2013-10-18 ENCOUNTER — Other Ambulatory Visit (INDEPENDENT_AMBULATORY_CARE_PROVIDER_SITE_OTHER): Payer: Medicare Other

## 2013-10-18 ENCOUNTER — Telehealth: Payer: Self-pay | Admitting: *Deleted

## 2013-10-18 DIAGNOSIS — E782 Mixed hyperlipidemia: Secondary | ICD-10-CM

## 2013-10-18 DIAGNOSIS — Z79899 Other long term (current) drug therapy: Secondary | ICD-10-CM

## 2013-10-18 LAB — ALT: ALT: 22 U/L (ref 0–53)

## 2013-10-19 LAB — NMR LIPOPROFILE WITH LIPIDS
Cholesterol, Total: 126 mg/dL (ref <200)
HDL Particle Number: 31.5 umol/L (ref >=30.5)
HDL SIZE: 8.6 nm — AB (ref >=9.2)
HDL-C: 38 mg/dL — AB (ref >=40)
LDL CALC: 46 mg/dL (ref <100)
LDL PARTICLE NUMBER: 866 nmol/L (ref <1000)
LDL SIZE: 19.9 nm — AB (ref >20.5)
LP-IR Score: 46 — ABNORMAL HIGH (ref <=45)
Large VLDL-P: <0.8 nmol/L (ref <=2.7)
Small LDL Particle Number: 623 nmol/L — ABNORMAL HIGH (ref <=527)
Triglycerides: 211 mg/dL — ABNORMAL HIGH (ref <150)
VLDL Size: 42.5 nm (ref <=46.6)

## 2013-10-19 NOTE — Telephone Encounter (Signed)
Forward to [employer]

## 2013-10-20 ENCOUNTER — Ambulatory Visit: Payer: Medicare Other | Admitting: Interventional Cardiology

## 2013-10-23 ENCOUNTER — Encounter: Payer: Self-pay | Admitting: Interventional Cardiology

## 2013-10-25 ENCOUNTER — Ambulatory Visit: Payer: Medicare Other | Admitting: Interventional Cardiology

## 2013-10-27 ENCOUNTER — Telehealth: Payer: Self-pay | Admitting: Interventional Cardiology

## 2013-10-27 ENCOUNTER — Telehealth: Payer: Self-pay | Admitting: Cardiology

## 2013-10-27 ENCOUNTER — Other Ambulatory Visit: Payer: Self-pay | Admitting: Interventional Cardiology

## 2013-10-27 DIAGNOSIS — E785 Hyperlipidemia, unspecified: Secondary | ICD-10-CM

## 2013-10-27 NOTE — Telephone Encounter (Signed)
Follow up  Pt called states he was retuning a call from Amy// He is requesting a cal back

## 2013-10-27 NOTE — Telephone Encounter (Signed)
Future labs ordered.

## 2013-10-27 NOTE — Telephone Encounter (Signed)
Returned pts call with cholesterol labwork.

## 2013-11-06 ENCOUNTER — Encounter: Payer: Self-pay | Admitting: Interventional Cardiology

## 2013-11-06 ENCOUNTER — Ambulatory Visit (INDEPENDENT_AMBULATORY_CARE_PROVIDER_SITE_OTHER): Payer: Medicare Other | Admitting: Interventional Cardiology

## 2013-11-06 VITALS — BP 144/82 | HR 58 | Ht 72.0 in | Wt 214.0 lb

## 2013-11-06 DIAGNOSIS — I2510 Atherosclerotic heart disease of native coronary artery without angina pectoris: Secondary | ICD-10-CM

## 2013-11-06 DIAGNOSIS — I10 Essential (primary) hypertension: Secondary | ICD-10-CM | POA: Insufficient documentation

## 2013-11-06 DIAGNOSIS — E785 Hyperlipidemia, unspecified: Secondary | ICD-10-CM

## 2013-11-06 DIAGNOSIS — R030 Elevated blood-pressure reading, without diagnosis of hypertension: Secondary | ICD-10-CM

## 2013-11-06 DIAGNOSIS — E663 Overweight: Secondary | ICD-10-CM

## 2013-11-06 NOTE — Patient Instructions (Signed)
Your physician recommends that you continue on your current medications as directed. Please refer to the Current Medication list given to you today.  Your physician wants you to follow-up in: 1 year with Dr. Irish Lack. You will receive a reminder letter in the mail two months in advance. If you don't receive a letter, please call our office to schedule the follow-up appointment.

## 2013-11-06 NOTE — Progress Notes (Signed)
Patient ID: [name], male   DOB: 07-26-1946, 68 y.o.   MRN: [id number]    [place], [address], [place]  [address] Phone: [phone] Fax:  [phone]  Date:  11/06/2013   ID:  [name], DOB 20-May-1946, MRN [id number]  PCP:   Melinda Crutch, MD      History of Present Illness: [name] is a 68 y.o. male who has had CAD. He has been exercising regularly and his stamina improves. He uses the elliptical. He has lost some weight since his last visit.   CAD/ASCVD:  Denies : Chest pain.  Dizziness.  Dyspnea on exertion.  Leg edema.  Nitroglycerin.  Orthopnea.  Palpitations.  Syncope.     Wt Readings from Last 3 Encounters:  11/06/13 214 lb (97.07 kg)  11/03/11 216 lb 12.8 oz (98.34 kg)  10/07/11 212 lb (96.163 kg)     Past Medical History  Diagnosis Date  . CAD (coronary artery disease)   . GAD (generalized anxiety disorder)   . Hyperlipidemia   . Overweight     Current Outpatient Prescriptions  Medication Sig Dispense Refill  . ALPRAZolam (XANAX) 0.5 MG tablet Take 0.5 mg by mouth as needed. When he travels      . aspirin 81 MG tablet Take 81 mg by mouth Howard.       . CRESTOR 40 MG tablet TAKE 1 TABLET BY MOUTH Howard  90 tablet  1  . DOXYLAMINE SUCCINATE PO Take 1 tablet by mouth at bedtime as needed. For sleep      . Melatonin 10 MG TABS Take 10 mg by mouth as needed.       . multivitamin-iron-minerals-folic acid (CENTRUM) chewable tablet Chew 1 tablet by mouth 2 (two) times Howard.       . Omega-3 Fatty Acids (FISH OIL) 1200 MG CAPS Take 1 capsule by mouth 6 (six) times Howard.       Marland Kitchen OVER THE COUNTER MEDICATION BORAGE OIL  ONCE A  Howard       . Probiotic Product (PROBIOTIC COMPLEX ACIDOPHILUS PO) Take 1 capsule by mouth Howard.       No current facility-administered medications for this visit.    Allergies:    Allergies  Allergen Reactions  . Penicillins Hives    Social History:  The patient  reports that he quit smoking about 28  years ago. He does not have any smokeless tobacco history on file. He reports that he drinks alcohol. He reports that he does not use illicit drugs.   Family History:  The patient's family history includes Cancer in his sister; Heart disease in his father.   ROS:  Please see the history of present illness.  No nausea, vomiting.  No fevers, chills.  No focal weakness.  No dysuria.   All other systems reviewed and negative.   PHYSICAL EXAM: VS:  BP 144/82  Pulse 58  Ht 6' (1.829 m)  Wt 214 lb (97.07 kg)  BMI 29.02 kg/m2 Well nourished, well developed, in no acute distress HEENT: normal Neck: no JVD, no carotid bruits Cardiac:  normal S1, S2; RRR;  Lungs:  clear to auscultation bilaterally, no wheezing, rhonchi or rales Abd: soft, nontender, no hepatomegaly Ext: no edema, 2+ right radial pulse Skin: warm and dry Neuro:   no focal abnormalities noted  EKG:  NSR, prolonged PR, no ST segment changes    ASSESSMENT AND PLAN:  Coronary atherosclerosis of native coronary artery  Continue Aspirin Tablet Chewable, 81 MG, 1 tablet, Orally, Once a day Diagnostic Imaging:EKG Harward,Amy 10/17/2012 03:11:53 PM > Sheily Lineman,JAY 10/17/2012 03:28:55 PM > sinus bradycardia, no ST segment changes  No angina. Known occluded RCA and diagonal vessel. Stress test in 2007 reviewed. No change in exercise tolerance.    2. Mixed hyperlipidemia  Continue Crestor Tablet, 40.0 Milligram, TAKE 1 TABLET BY MOUTH Howard Increase Fish Oil Capsule, 1200 MG, Nordic Naturals Omega 3, Orally, 6 capsules Howard TG increased, > 200, at 211.   LDL controled in 2/15.  Will enquire about PCSK9 trials for him.     3. Mild obesity  We spoke at length about interval training with cardio exercises to improve metabolism. Also spoke about nutrition. He had started to "juice" and eat less processed foods.    4. Elevated BP : Elevated here.  He checks BP at the pharmacy.  Systolic in the 276F.     Preventive Medicine  Adult  topics discussed:  Diet: healthy diet.  Exercise: 5 days a week, at least 30 minutes of aerobic exercise.    Procedure Codes     Signed, Mina Marble, MD, [facility] 11/06/2013 9:41 AM

## 2013-11-09 ENCOUNTER — Telehealth: Payer: Self-pay | Admitting: Cardiology

## 2013-11-09 NOTE — Telephone Encounter (Signed)
Patient's LDL has been < 70 and non-HDL is < 100, so he would not qualify for study at this time.  If his cholesterol worsens, or if he has trouble with Crestor 40 mg (side effects), he may qualify for study is he has to be on a lower statin dose.  Please notify patient and let him know.  If he is having trouble tolerating Crestor 40 mg daily, let me know.  Otherwise continue current therapy and get next lab as scheduled.  Thanks.

## 2013-11-09 NOTE — Telephone Encounter (Signed)
[name], pt wanted to inquire information about the PCSK9 trial. Would he be a candidate and what would he have to do?

## 2013-11-10 NOTE — Telephone Encounter (Signed)
lmtrc

## 2013-11-10 NOTE — Telephone Encounter (Signed)
Pt notified. Pt denies myalgias at this times.

## 2014-04-26 ENCOUNTER — Other Ambulatory Visit (INDEPENDENT_AMBULATORY_CARE_PROVIDER_SITE_OTHER): Payer: Medicare Other

## 2014-04-26 ENCOUNTER — Other Ambulatory Visit: Payer: Self-pay | Admitting: Interventional Cardiology

## 2014-04-26 DIAGNOSIS — E785 Hyperlipidemia, unspecified: Secondary | ICD-10-CM

## 2014-04-26 LAB — HEPATIC FUNCTION PANEL
ALK PHOS: 59 U/L (ref 39–117)
ALT: 25 U/L (ref 0–53)
AST: 27 U/L (ref 0–37)
Albumin: 3.6 g/dL (ref 3.5–5.2)
BILIRUBIN DIRECT: 0.1 mg/dL (ref 0.0–0.3)
Total Bilirubin: 0.8 mg/dL (ref 0.2–1.2)
Total Protein: 6.8 g/dL (ref 6.0–8.3)

## 2014-04-27 LAB — NMR LIPOPROFILE WITH LIPIDS
Cholesterol, Total: 117 mg/dL (ref <200)
HDL PARTICLE NUMBER: 33.8 umol/L (ref >=30.5)
HDL Size: 8.3 nm — ABNORMAL LOW (ref >=9.2)
HDL-C: 37 mg/dL — ABNORMAL LOW (ref >=40)
LDL (calc): 53 mg/dL (ref <100)
LDL PARTICLE NUMBER: 2016 nmol/L — AB (ref <1000)
LDL Size: 20.3 nm — ABNORMAL LOW (ref >20.5)
LP-IR SCORE: 74 — AB (ref <=45)
Large HDL-P: <1.3 umol/L — ABNORMAL LOW (ref >=4.8)
Large VLDL-P: 5.7 nmol/L — ABNORMAL HIGH (ref <=2.7)
Small LDL Particle Number: 1237 nmol/L — ABNORMAL HIGH (ref <=527)
TRIGLYCERIDES: 135 mg/dL (ref <150)
VLDL SIZE: 47.7 nm — AB (ref <=46.6)

## 2014-05-09 ENCOUNTER — Other Ambulatory Visit: Payer: Self-pay | Admitting: Cardiology

## 2014-05-09 DIAGNOSIS — E785 Hyperlipidemia, unspecified: Secondary | ICD-10-CM

## 2014-06-27 ENCOUNTER — Encounter: Payer: Self-pay | Admitting: Interventional Cardiology

## 2014-07-30 ENCOUNTER — Other Ambulatory Visit (INDEPENDENT_AMBULATORY_CARE_PROVIDER_SITE_OTHER): Payer: Medicare Other | Admitting: *Deleted

## 2014-07-30 DIAGNOSIS — E785 Hyperlipidemia, unspecified: Secondary | ICD-10-CM

## 2014-07-30 LAB — HEPATIC FUNCTION PANEL
ALT: 23 U/L (ref 0–53)
AST: 26 U/L (ref 0–37)
Albumin: 3.8 g/dL (ref 3.5–5.2)
Alkaline Phosphatase: 62 U/L (ref 39–117)
BILIRUBIN DIRECT: 0.1 mg/dL (ref 0.0–0.3)
BILIRUBIN TOTAL: 0.6 mg/dL (ref 0.2–1.2)
TOTAL PROTEIN: 6.8 g/dL (ref 6.0–8.3)

## 2014-08-01 LAB — NMR LIPOPROFILE WITH LIPIDS
Cholesterol, Total: 132 mg/dL (ref 100–199)
HDL PARTICLE NUMBER: 28.8 umol/L — AB (ref >=30.5)
HDL Size: 8.4 nm — ABNORMAL LOW (ref >=9.2)
HDL-C: 37 mg/dL — AB (ref >39)
LDL (calc): 68 mg/dL (ref 0–99)
LDL Particle Number: 1130 nmol/L — ABNORMAL HIGH (ref <1000)
LDL Size: 20.1 nm (ref >=20.8)
LP-IR Score: 49 — ABNORMAL HIGH (ref <=45)
Small LDL Particle Number: 726 nmol/L — ABNORMAL HIGH (ref <=527)
TRIGLYCERIDES: 135 mg/dL (ref 0–149)
VLDL Size: 43 nm (ref <=46.6)

## 2014-08-02 ENCOUNTER — Other Ambulatory Visit: Payer: Self-pay

## 2014-08-02 DIAGNOSIS — E785 Hyperlipidemia, unspecified: Secondary | ICD-10-CM

## 2014-08-02 DIAGNOSIS — Z79899 Other long term (current) drug therapy: Secondary | ICD-10-CM

## 2014-10-26 ENCOUNTER — Ambulatory Visit: Payer: Self-pay

## 2014-10-26 ENCOUNTER — Ambulatory Visit (INDEPENDENT_AMBULATORY_CARE_PROVIDER_SITE_OTHER): Payer: Medicare Other | Admitting: Podiatrist

## 2014-10-26 ENCOUNTER — Encounter: Payer: Self-pay | Admitting: Podiatrist

## 2014-10-26 VITALS — BP 126/77 | HR 64 | Resp 12

## 2014-10-26 DIAGNOSIS — G588 Other specified mononeuropathies: Secondary | ICD-10-CM

## 2014-10-26 DIAGNOSIS — G5760 Lesion of plantar nerve, unspecified lower limb: Secondary | ICD-10-CM | POA: Diagnosis not present

## 2014-10-26 DIAGNOSIS — R52 Pain, unspecified: Secondary | ICD-10-CM

## 2014-10-26 DIAGNOSIS — I7300 Raynaud's syndrome without gangrene: Secondary | ICD-10-CM

## 2014-10-26 NOTE — Progress Notes (Signed)
Subjective:    Patient ID: [name], male    DOB: [birth year], 69 y.o.   MRN: [id number]  HPI  PT STATED B/L BALL OF THE FOOT ARE HAVING PAIN FOR 9 MONTHS. FEET ARE GETTING WORSE, ESPECIALLY WHEN WALKING. TRIED ORTHOTICS BUT NO HELP.  ALSO, LT FOOT HAVE CALLUS AND IT HURT.  Review of Systems  All other systems reviewed and are negative.      Objective:   Physical Exam Patient is awake, alert, and oriented x 3.  In no acute distress.  Vascular status is intact with palpable pedal pulses at 2/4 DP and PT bilateral and capillary refill time is mildly decreased consistent with raynauds phenomenon. Neurological sensation is also intact bilaterally via Semmes Weinstein monofilament at 5/5 sites. Light touch, vibratory sensation, Achilles tendon reflex is intact. Neuroma type symptomatology elicted 2nd interspace bilateral.  Mildly prominent metatarsal heads noted bilateral as well. Dermatological exam reveals skin color, turger and texture as normal. No open lesions present.  Musculature intact with dorsiflexion, plantarflexion, inversion, eversion.  xrays negative for fracture or dislocation. Crowding of metatarsal heads noted. 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  Discussed the importance of keeping his feet warm especially in the winter time with foot warmers and warm boots especially when going to watch his grandsons play ice hockey.  If the injection is not beneficial he will call.

## 2014-10-26 NOTE — Patient Instructions (Signed)
Raynaud's Syndrome Raynaud's Syndrome is a disorder of the blood vessels in your hands and feet. It occurs when small arteries of the arms/hands or legs/feet become sensitive to cold or emotional upset. This causes the arteries to constrict, or narrow, and reduces blood flow to the area. The color in the fingers or toes changes from white to bluish to red and this is not usually painful. There may be numbness and tingling. Sores on the skin (ulcers) can form. Symptoms are usually relieved by warming. HOME CARE INSTRUCTIONS   Avoid exposure to cold. Keep your whole body warm and dry. Dress in layers. Wear mittens or gloves when handling ice or frozen food and when outdoors. Use holders for glasses or cans containing cold drinks. If possible, stay indoors during cold weather.  Limit your use of caffeine. Switch to decaffeinated coffee, tea, and soda pop. Avoid chocolate.  Avoid smoking or being around cigarette smoke. Smoke will make symptoms worse.  Wear loose fitting socks and comfortable, roomy shoes.  Avoid vibrating tools and machinery.  If possible, avoid stressful and emotional situations. Exercise, meditation and yoga may help you cope with stress. Biofeedback may be useful.  Ask your caregiver about medicine (calcium channel blockers) that may control Raynaud's phenomena. SEEK MEDICAL CARE IF:   Your discomfort becomes worse, despite conservative treatment.  You develop sores on your fingers and toes that do not heal. Document Released: 08/14/2000 Document Revised: 11/09/2011 Document Reviewed: 08/21/2008 [facility] Patient Information 2015 [place], [place]. This information is not intended to replace advice given to you by your health care provider. Make sure you discuss any questions you have with your health care provider.

## 2014-10-30 ENCOUNTER — Other Ambulatory Visit: Payer: Self-pay | Admitting: Interventional Cardiology

## 2014-11-02 ENCOUNTER — Telehealth: Payer: Self-pay | Admitting: *Deleted

## 2014-11-02 NOTE — Telephone Encounter (Signed)
Pt states he received a injection and the site is very painful now.  I left message informing pt that at times steroid injections could cause a flare-up of symptoms.  I encouraged pt to use ice 3 - 4 times a day for 10 minutes to the site, use whatever he takes for a headache for the pain, that the OTC Ibuprofen as the package directs might work best since flare-up is a inflammatory process, and call again with concerns.

## 2014-11-06 ENCOUNTER — Telehealth: Payer: Self-pay

## 2014-11-06 NOTE — Telephone Encounter (Signed)
Left a message for patient to call and schedule an appt for increased pain in foot

## 2014-11-14 ENCOUNTER — Ambulatory Visit (INDEPENDENT_AMBULATORY_CARE_PROVIDER_SITE_OTHER): Payer: Medicare Other | Admitting: Interventional Cardiology

## 2014-11-14 ENCOUNTER — Encounter: Payer: Self-pay | Admitting: Interventional Cardiology

## 2014-11-14 VITALS — BP 124/60 | HR 48 | Ht 72.0 in | Wt 208.0 lb

## 2014-11-14 DIAGNOSIS — R001 Bradycardia, unspecified: Secondary | ICD-10-CM

## 2014-11-14 DIAGNOSIS — E785 Hyperlipidemia, unspecified: Secondary | ICD-10-CM

## 2014-11-14 DIAGNOSIS — I10 Essential (primary) hypertension: Secondary | ICD-10-CM

## 2014-11-14 DIAGNOSIS — I2510 Atherosclerotic heart disease of native coronary artery without angina pectoris: Secondary | ICD-10-CM

## 2014-11-14 NOTE — Patient Instructions (Signed)
Your physician recommends that you continue on your current medications as directed. Please refer to the Current Medication list given to you today.  Your physician wants you to follow-up in: 1 year with Dr. Irish Lack. You will receive a reminder letter in the mail two months in advance. If you don't receive a letter, please call our office to schedule the follow-up appointment.

## 2014-11-14 NOTE — Progress Notes (Signed)
Patient ID: [name], male   DOB: 1945/11/20, 69 y.o.   MRN: [id number]     Cardiology Office Note   Date:  11/14/2014   ID:  MALIKAH LAKEY, DOB [birth year], MRN [id number]  PCP:   Melinda Crutch, MD  Cardiologist:   Jettie Booze., MD   No chief complaint on file.     History of Present Illness: [name] is a 69 y.o. male who presents for evaluation of her CAD.  He had CAD with a known CTO of the RCA. He had been exercising regularly and his stamina improved, but he has not been doing this as much of late. 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 He has lost some weight since his last visit. He continues to have a low HR.  His HR goes up to 95 bpm. CAD/ASCVD:  Denies : Chest pain.  Dizziness.  Dyspnea on exertion.  Leg edema.  Nitroglycerin.  Orthopnea.  Palpitations.  Syncope.     Past Medical History  Diagnosis Date  . CAD (coronary artery disease)   . GAD (generalized anxiety disorder)   . Hyperlipidemia   . Overweight(278.02)     Past Surgical History  Procedure Laterality Date  . Tonsillectomy  1955  . Cardiac catheterization  2005  . Anal fissure repair    . Skin cancer excision    . Umbilical hernia repair  10/07/2011    Procedure: HERNIA REPAIR UMBILICAL ADULT;  Surgeon: Rolm Bookbinder, MD;  Location: [facility];  Service: General;  Laterality: N/A;  with mesh      Current Outpatient Prescriptions  Medication Sig Dispense Refill  . aspirin 81 MG tablet Take 81 mg by mouth daily.     . B Complex Vitamins (VITAMIN B COMPLEX PO) Take by mouth.    . clonazePAM (KLONOPIN) 0.5 MG tablet   0  . Coenzyme Q10 (COQ10 PO) Take by mouth daily.    . CRESTOR 40 MG tablet TAKE 1 TABLET BY MOUTH DAILY 90 tablet 0  . Lactobacillus (ACIDOPHILUS PO) Take by mouth.    . Multiple Vitamins-Minerals (CENTRUM CARDIO PO) Take by mouth.    . Omega-3 Fatty Acids (OMEGA 3 PO) Take 3,840 mg by mouth 2 (two) times daily. Nordic Natural ultimate Omega 3     No current  facility-administered medications for this visit.    Allergies:   Penicillins    Social History:  The patient  reports that he quit smoking about 29 years ago. He does not have any smokeless tobacco history on file. He reports that he drinks alcohol. He reports that he does not use illicit drugs.   Family History:  The patient's *family history includes Cancer in his sister; Heart disease in his father.    ROS:  Please see the history of present illness.   Otherwise, review of systems are positive for none.   All other systems are reviewed and negative.    PHYSICAL EXAM: VS:  BP 124/60 mmHg  Pulse 48  Ht 6' (1.829 m)  Wt 208 lb (94.348 kg)  BMI 28.20 kg/m2 , BMI Body mass index is 28.2 kg/(m^2). GEN: Well nourished, well developed, in no acute distress HEENT: normal Neck: no JVD, carotid bruits, or masses Cardiac: bradycardic; no murmurs, rubs, or gallops,no edema  Respiratory:  clear to auscultation bilaterally, normal work of breathing GI: soft, nontender, nondistended, + BS MS: no deformity or atrophy Skin: warm and dry, no rash Neuro:  Strength and sensation are  intact Psych: euthymic mood, full affect   EKG:  EKG is ordered today. The ekg ordered today demonstrates Marked sinus bradycardia with prolonged PR interval   Recent Labs: 07/30/2014: ALT 23    Lipid Panel    Component Value Date/Time   CHOL 132 07/30/2014 1204   TRIG 135 07/30/2014 1204   HDL 37* 07/30/2014 1204   LDLCALC 68 07/30/2014 1204      Wt Readings from Last 3 Encounters:  11/14/14 208 lb (94.348 kg)  11/06/13 214 lb (97.07 kg)  11/03/11 216 lb 12.8 oz (98.34 kg)      Other studies Reviewed: Additional studies/ records that were reviewed today include: . Review of the above records demonstrates:    ASSESSMENT AND PLAN:  1.   Coronary atherosclerosis of native coronary artery  Continue Aspirin Tablet Chewable, 81 MG, 1 tablet, Orally, Once a day   No angina. Known occluded RCA  and diagonal vessel. Stress test in 2007 reviewed. No change in exercise tolerance when he actually makes it to the gym. COntinue aggressive secondary preention   2. Mixed hyperlipidemia  Continue Crestor Tablet, 40.0 Milligram, TAKE 1 TABLET BY MOUTH DAILY Continue Fish Oil Capsule, 1200 MG, Nordic Naturals Omega 3, Orally, 6 capsules Daily TG improved, 135,, . LDL controlled 68 in 11/15. He asked above Provicor.  He saw a trial at [facility] about this.    3. Mild obesity  We spoke about increasing frequency of cardio exercises to improve metabolism. Also spoke about nutrition. He is eating less processed foods.    4. Elevated BP : Controlled here. He checks BP at the pharmacy. Systolic in the 962/83 range.  5.  Bradycardia:  Watch for sx of bradycardia.  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 There is no indication for pacemaker at this time. We did go over the symptoms of symptomatic bradycardia and what to watch for. He will let us know if he has any of these symptoms.  Preventive Medicine  Adult topics discussed:  Diet: healthy diet.  Exercise: 5 days a week, at least 30 minutes of aerobic exercise.              Current medicines are reviewed at length with the patient today.  The patient does not have concerns regarding medicines.  The following changes have been made:  no change  Labs/ tests ordered today include: lipids scheduled  No orders of the defined types were placed in this encounter.     Disposition:   FU in 1 year   Teresita Madura., MD  11/14/2014 11:15 AM    [facility] [place], [place], [place]  [address] Phone: [phone]; Fax: [phone]

## 2014-11-21 ENCOUNTER — Encounter: Payer: Self-pay | Admitting: Podiatrist

## 2014-11-21 ENCOUNTER — Ambulatory Visit (INDEPENDENT_AMBULATORY_CARE_PROVIDER_SITE_OTHER): Payer: Medicare Other | Admitting: Podiatrist

## 2014-11-21 VITALS — BP 141/79 | HR 64 | Resp 12

## 2014-11-21 DIAGNOSIS — G5760 Lesion of plantar nerve, unspecified lower limb: Secondary | ICD-10-CM | POA: Diagnosis not present

## 2014-11-21 DIAGNOSIS — G588 Other specified mononeuropathies: Secondary | ICD-10-CM

## 2014-11-29 NOTE — Progress Notes (Signed)
Chief Complaint  Patient presents with  . Foot Pain    ''B/L BALL FOOT STILL HAVE BURNING SENSATION AND SORE GOING UP THE TOES.''     HPI: Patient is 69 y.o. male who presents today for continued pain in the ball of the right  Foot. He states the injection was of little benefit at the last visit.    Allergies  Allergen Reactions  . Penicillins Hives    Physical Exam  Patient is awake, alert, and oriented x 3.  In no acute distress.  Vascular status is intact with palpable pedal pulses at 2/4 DP and PT bilateral and capillary refill time within normal limits. Neurological sensation is also intact bilaterally via Semmes Weinstein monofilament at 5/5 sites. Light touch, vibratory sensation, Achilles tendon reflex is intact. Dermatological exam reveals skin color, turger and texture as normal. No open lesions present.  Musculature intact with dorsiflexion, plantarflexion, inversion, eversion. Neuroma type symptomatology is present 3rd interspace of the right foot.  At the last visit, the 2nd interspace was suspected, however it is more located near the 3rd interspace at today's encounter. Palpable click is noted.    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   Plan: therapeutic and diagnostic injection was administered 3rd interspace of the right foot under sterile technique. Discussed if this does not improve the pain, can consider an mri.  Would also consider orthotic modification to offload the metatarsals as well if the pain returns.  He will let me know how he progresses.

## 2015-01-23 ENCOUNTER — Encounter: Payer: Self-pay | Admitting: Interventional Cardiology

## 2015-01-29 ENCOUNTER — Other Ambulatory Visit: Payer: Self-pay | Admitting: Interventional Cardiology

## 2015-02-06 ENCOUNTER — Other Ambulatory Visit (INDEPENDENT_AMBULATORY_CARE_PROVIDER_SITE_OTHER): Payer: Medicare Other | Admitting: *Deleted

## 2015-02-06 DIAGNOSIS — E785 Hyperlipidemia, unspecified: Secondary | ICD-10-CM

## 2015-02-06 DIAGNOSIS — Z79899 Other long term (current) drug therapy: Secondary | ICD-10-CM

## 2015-02-06 LAB — HEPATIC FUNCTION PANEL
ALT: 22 U/L (ref 0–53)
AST: 29 U/L (ref 0–37)
Albumin: 4.1 g/dL (ref 3.5–5.2)
Alkaline Phosphatase: 53 U/L (ref 39–117)
Bilirubin, Direct: 0.2 mg/dL (ref 0.0–0.3)
Total Bilirubin: 0.9 mg/dL (ref 0.2–1.2)
Total Protein: 6.5 g/dL (ref 6.0–8.3)

## 2015-02-08 ENCOUNTER — Other Ambulatory Visit: Payer: Self-pay | Admitting: *Deleted

## 2015-02-08 DIAGNOSIS — E785 Hyperlipidemia, unspecified: Secondary | ICD-10-CM

## 2015-02-08 LAB — NMR LIPOPROFILE WITH LIPIDS
CHOLESTEROL, TOTAL: 117 mg/dL (ref 100–199)
HDL PARTICLE NUMBER: 27.4 umol/L — AB (ref >=30.5)
HDL Size: 8.6 nm — ABNORMAL LOW (ref >=9.2)
HDL-C: 35 mg/dL — AB (ref >39)
LDL CALC: 58 mg/dL (ref 0–99)
LDL PARTICLE NUMBER: 771 nmol/L (ref <1000)
LDL Size: 20.5 nm (ref >=20.8)
LP-IR SCORE: 49 — AB (ref <=45)
Large HDL-P: 2.5 umol/L — ABNORMAL LOW (ref >=4.8)
SMALL LDL PARTICLE NUMBER: 335 nmol/L (ref <=527)
Triglycerides: 120 mg/dL (ref 0–149)
VLDL SIZE: 45.3 nm (ref <=46.6)

## 2015-06-03 ENCOUNTER — Ambulatory Visit (INDEPENDENT_AMBULATORY_CARE_PROVIDER_SITE_OTHER): Payer: Medicare Other | Admitting: Podiatry

## 2015-06-03 ENCOUNTER — Encounter: Payer: Self-pay | Admitting: Podiatry

## 2015-06-03 VITALS — BP 124/70 | HR 46 | Resp 16

## 2015-06-03 DIAGNOSIS — M79671 Pain in right foot: Secondary | ICD-10-CM

## 2015-06-03 DIAGNOSIS — G5760 Lesion of plantar nerve, unspecified lower limb: Secondary | ICD-10-CM | POA: Diagnosis not present

## 2015-06-03 DIAGNOSIS — G588 Other specified mononeuropathies: Secondary | ICD-10-CM

## 2015-06-05 ENCOUNTER — Other Ambulatory Visit (INDEPENDENT_AMBULATORY_CARE_PROVIDER_SITE_OTHER): Payer: Medicare Other | Admitting: *Deleted

## 2015-06-05 DIAGNOSIS — E785 Hyperlipidemia, unspecified: Secondary | ICD-10-CM

## 2015-06-05 LAB — HEPATIC FUNCTION PANEL
ALT: 20 U/L (ref 9–46)
AST: 24 U/L (ref 10–35)
Albumin: 4.4 g/dL (ref 3.6–5.1)
Alkaline Phosphatase: 57 U/L (ref 40–115)
BILIRUBIN DIRECT: 0.1 mg/dL (ref <=0.2)
BILIRUBIN TOTAL: 0.8 mg/dL (ref 0.2–1.2)
Indirect Bilirubin: 0.7 mg/dL (ref 0.2–1.2)
Total Protein: 6.9 g/dL (ref 6.1–8.1)

## 2015-06-06 ENCOUNTER — Telehealth: Payer: Self-pay | Admitting: Interventional Cardiology

## 2015-06-06 NOTE — Telephone Encounter (Signed)
Spoke with [facility], order changed to cardiac IQ, test code [phone]. This is the same as [facility] lipoprofile. They will be sending paperwork for dr v to sign.

## 2015-06-06 NOTE — Telephone Encounter (Signed)
New message      Talk to the nurse or Dr Irish Lack regarding finding another lab test for [facility] lipoprofile.  They no longer do that test.

## 2015-06-07 ENCOUNTER — Encounter: Payer: Self-pay | Admitting: Podiatry

## 2015-06-07 NOTE — Progress Notes (Signed)
Subjective: 69 year old male presents to the office today for recurrence of pain to the right foot. He states that he had an injection third interspace and the right foot last appointment he was doing well. He started to have recurrence of the last couple weeks. He does get some pain in the ball of his foot and other areas as well around the fifth toe. He denies any recent injury or trauma. He denies any acute changes since last appointment. No other complaints at this time in no acute changes.  Objective: AAO x3, NAD DP/PT pulses palpable bilaterally, CRT less than 3 seconds Protective sensation intact with Simms Weinstein monofilament There is tenderness palpation of the right third interspace. There subjective numbness of the third and fourth toes. There is also mild tenderness in the second interspace and the right foot. There is also some subjective numbness the second toe as well as the third toe. There is no definitive neuroma palpable is no clicking sensation. There is no area pinpoint bony tenderness or pain the vibratory sensation. There is prominence of the metatarsal heads plantarly with atrophy of the fat pad. No areas of tenderness to bilateral lower extremities. MMT 5/5, ROM WNL.  No open lesions or pre-ulcerative lesions.  No overlying edema, erythema, increase in warmth to bilateral lower extremities.  No pain with calf compression, swelling, warmth, erythema bilaterally.  Evaluation orthotics they do not appear to be fitting well and are worn out.  Assessment: 69 year old male with likely neuroma, right foot pain  Plan: -Treatment options discussed including all alternatives, risks, and complications -I discussed steroid injection including risks, occasions for which she understands and consents 2. Under sterile conditions a total of 2 mL of a mixture of Kenalog 10, 0.5% Marcaine Weatherly and 2% lidocaine Marcotte was infiltrated along the second and third interspaces. He tolerated the  injection well any complications. Post injection care was discussed. I discussed MRI however he wishes to hold off on that at this time. -He would proceed and orthotics. Patient scanned for orthotics they were sent to [facility] labs. -Follow-up after orthotics or sooner if any problems are to arise. Call any questions or concerns in the meantime.  Celesta Gentile, DPM

## 2015-06-11 LAB — CARDIO IQ(R) ADVANCED LIPID PANEL
Apolipoprotein B: 74 mg/dL (ref 52–109)
CHOLESTEROL/HDL RATIO (CARDIO IQ ADV LIPID PANEL): 3.1 calc (ref <=5.0)
Cholesterol, Total: 129 mg/dL (ref 125–200)
HDL CHOLESTEROL (CARDIO IQ ADV LIPID PANEL): 41 mg/dL (ref >=40)
LDL Large: 4299 nmol/L — ABNORMAL LOW (ref 4334–10815)
LDL Medium: 177 nmol/L (ref 167–465)
LDL Particle Number: 877 nmol/L — ABNORMAL LOW (ref 1016–2185)
LDL Peak Size: 212.1 Angstrom — ABNORMAL LOW (ref >=218.2)
LDL Small: 221 nmol/L (ref 123–441)
LDL, Calculated: 63 mg/dL
Lipoprotein (a): 309 nmol/L — ABNORMAL HIGH (ref <75)
Non-HDL Cholesterol: 88 mg/dL
TRIGLYCERIDES (CARDIO IQ ADV LIPID PANEL): 127 mg/dL

## 2015-06-17 ENCOUNTER — Other Ambulatory Visit: Payer: Self-pay

## 2015-06-17 DIAGNOSIS — I10 Essential (primary) hypertension: Secondary | ICD-10-CM

## 2015-06-28 ENCOUNTER — Ambulatory Visit (INDEPENDENT_AMBULATORY_CARE_PROVIDER_SITE_OTHER): Payer: Medicare Other | Admitting: Podiatry

## 2015-06-28 VITALS — BP 121/69 | HR 51 | Temp 97.9°F | Resp 12

## 2015-06-28 DIAGNOSIS — M79671 Pain in right foot: Secondary | ICD-10-CM

## 2015-06-28 DIAGNOSIS — L03119 Cellulitis of unspecified part of limb: Secondary | ICD-10-CM

## 2015-06-28 DIAGNOSIS — L02619 Cutaneous abscess of unspecified foot: Secondary | ICD-10-CM

## 2015-06-28 DIAGNOSIS — M216X9 Other acquired deformities of unspecified foot: Secondary | ICD-10-CM | POA: Diagnosis not present

## 2015-06-28 MED ORDER — CLINDAMYCIN HCL 300 MG PO CAPS: 300.0000 mg | ORAL_CAPSULE | Freq: Three times a day (TID) | ORAL | Status: DC

## 2015-06-28 NOTE — Progress Notes (Signed)
Patient ID: LASALLE ABEE, male   DOB: May 01, 1946, 69 y.o.   MRN: [id number]  Subjective: 69 year old male presents the office at the orthotics. While he was "orthotics he states that he has pain to the top of his right foot which been ongoing for approximate 2 weeks. He has noticed a small amount of redness this area needs been applying peroxide. He denies any injury or trauma. Denies any red streaks. Denies any drainage or pus. No other complaints at this time. He denies any systemic complaints such as fevers, chills, nausea, vomiting. No calf pain, chest pain, shortness of breath.  Objective: AAO 3, NAD DP/PT pulses 2/4, CRT less than 3 seconds  Protective sensation intact with Derrel Nip monofilament Right dorsal medial aspect of the right foot is a small area of localized redness. Upon palpation of this area there was a pinpoint opening in which purulence was expressed. This appears to be almost a boil or folliculitis reaction. There is no ascending cellulitis. There is no overlying edema. There is no increase in warmth to the foot. No other open lesions or pre-ulcerative lesions. No pain with calf compression, swelling, warmth, erythema.  Assessment: 69 year old male presents the pick up orthotics with localized infection right foot  Plan: -Treatment options discussed including all alternatives, risks, and complications -Etiology of symptoms were discussed -Small area on the dorsal medial aspect of the right foot was lightly debrided and the small amount of purulence expressed. This appears to be very localized infection. Recommended Epson salt soaks daily. Prescribed clindamycin. Monitor for any clinical signs or symptoms of infection and directed to call the office immediately should any occur or go to the ER. Continue regular shoes was not any pressure overlying this area. -Wound culture obtained.  -Orthotics were dispensed today. Oral and break in instructions were  discussed. -Follow-up in 2 weeks or sooner if any problems arise. In the meantime, encouraged to call the office with any questions, concerns, change in symptoms.   Celesta Gentile, DPM

## 2015-06-28 NOTE — Patient Instructions (Signed)
WEARING INSTRUCTIONS FOR ORTHOTICS  Don't expect to be comfortable wearing your orthotic devices for the first time.  Like eyeglasses, you may be aware of them as time passes, they will not be uncomfortable and you will enjoy wearing them.  FOLLOW THESE INSTRUCTIONS EXACTLY!  1. Wear your orthotic devices for:       Not more than 1 hour the first day.       Not more than 2 hours the second day.       Not more than 3 hours the third day and so on.        Or wear them for as long as they feel comfortable.       If you experience discomfort in your feet or legs take them out.  When feet & legs feel       better, put them back in.  You do need to be consistent and wear them a little        everyday. 2.   If at any time the orthotic devices become acutely uncomfortable before the       time for that particular day, STOP WEARING THEM. 3.   On the next day, do not increase the wearing time. 4.   Subsequently, increase the wearing time by 15-30 minutes only if comfortable to do       so. 5.   You will be seen by your doctor about 2-4 weeks after you receive your orthotic       devices, at which time you will probably be wearing your devices comfortably        for about 8 hours or more a day. 6.   Some patients occasionally report mild aches or discomfort in other parts of the of       body such as the knees, hips or back after 3 or 4 consecutive hours of wear.  If this       is the case with you, do not extend your wearing time.  Instead, cut it back an hour or       two.  In all likelihood, these symptoms will disappear in a short period of time as your       body posture realigns itself and functions more efficiently. 7.   It is possible that your orthotic device may require some small changes or adjustment       to improve their function or make them more comfortable.   This is usually not done       before one to three months have elapsed.  These adjustments are made in        accordance  with the changed position your feet are assuming as a result of       improved biomechanical function. 8.   In women's shoes, it's not unusual for your heel to slip out of the shoe, particularly if       they are step-in-shoes.  If this is the case, try other shoes or other styles.  Try to       purchase shoes which have deeper heal seats or higher heel counters. 9.   Squeaking of orthotics devices in the shoes is due to the movement of the devices       when they are functioning normally.  To eliminate squeaking, simply dust some       baby powder into your shoes before inserting the devices.  If this does not work,  apply soap or wax to the edges of the orthotic devices or put a tissue into the shoes. 10. It is important that you follow these directions explicitly.  Failure to do so will simply       prolong the adjustment period or create problems which are easily avoided.  It makes       no difference if you are wearing your orthotic devices for only a few hours after        several months, so long as you are wearing them comfortably for those hours. 11. If you have any questions or complaints, contact our office.  We have no way of       knowing about your problems unless you tell us.  If we do not hear from you, we will       assume that you are proceeding well.

## 2015-06-29 ENCOUNTER — Encounter: Payer: Self-pay | Admitting: Podiatry

## 2015-07-01 ENCOUNTER — Encounter: Payer: Self-pay | Admitting: Podiatry

## 2015-07-12 ENCOUNTER — Ambulatory Visit (INDEPENDENT_AMBULATORY_CARE_PROVIDER_SITE_OTHER): Payer: Medicare Other | Admitting: Podiatry

## 2015-07-12 ENCOUNTER — Encounter: Payer: Self-pay | Admitting: Podiatry

## 2015-07-12 VITALS — BP 126/77 | HR 53 | Resp 12

## 2015-07-12 DIAGNOSIS — L02619 Cutaneous abscess of unspecified foot: Secondary | ICD-10-CM

## 2015-07-12 DIAGNOSIS — L03119 Cellulitis of unspecified part of limb: Secondary | ICD-10-CM

## 2015-07-12 DIAGNOSIS — M216X9 Other acquired deformities of unspecified foot: Secondary | ICD-10-CM | POA: Diagnosis not present

## 2015-07-15 ENCOUNTER — Encounter: Payer: Self-pay | Admitting: Podiatry

## 2015-07-15 NOTE — Progress Notes (Signed)
Patient ID: [name], male   DOB: 07/12/1946, 69 y.o.   MRN: [id number]  Subjective: Patient presents the office today for follow up evaluation of infection to right foot. He states that he did stop taking the antibiotics as they were upsetting his stomach. He states his. And about excessive symptoms have resolved. He states the pain is resolved to the top of the right foot on the side of infection and has not noticed any drainage or redness. He is also been wearing his orthotics which seem to help. He states the true tarsal be when he goes back to playing golf and becomes more active. Denies any recent injury or trauma. No other complaints at this time. He denies any systemic complaints as fevers, chills, nausea, vomiting. No calf pain, chest pain, shortness of breath.   Objective: AAO 3, NAD DP/PT pulses 2/4, CRT less than 3 seconds Protective sensation intact with Simms Weinstein monofilament On the dorsal medial aspect of the right foot over on the area of prior infection there is a small amount of dry skin however there is no open sore and there is no redness or red streaks. There is no areas of fluctuance or crepitus. There is no malodor. There is no drainage or purulence. Infection appears to resolve. There is currently no other areas of tenderness to bilateral lower extremities and there is no overlying edema, erythema, increase in warmth. No pain with calf compression, swelling, warmth, erythema.  Assessment: 69 year old male with resolving infection right foot; resolving foot pain with orthotics  Plan: -Treatment options discussed including all alternatives, risks, and complications -Recommended to continue to monitor for any recurrence of the infection. Additionally changes to call the office immediately. Currently appears the infection has resolved. -Continue with orthotics. -I would like to see him back after he becomes more active after playing golf if symptoms continue or sooner  if any problems are to arise. In the meantime I encouraged him to call the office with any questions, concerns, change in symptoms.  Celesta Gentile, DPM

## 2015-07-22 ENCOUNTER — Ambulatory Visit: Payer: Medicare Other | Admitting: Podiatry

## 2015-07-29 ENCOUNTER — Other Ambulatory Visit: Payer: Self-pay

## 2015-07-29 MED ORDER — ROSUVASTATIN CALCIUM 40 MG PO TABS: 40.0000 mg | ORAL_TABLET | Freq: Every day | ORAL | Status: DC

## 2015-11-25 ENCOUNTER — Other Ambulatory Visit (INDEPENDENT_AMBULATORY_CARE_PROVIDER_SITE_OTHER): Payer: Medicare Other | Admitting: *Deleted

## 2015-11-25 DIAGNOSIS — I10 Essential (primary) hypertension: Secondary | ICD-10-CM | POA: Diagnosis not present

## 2015-11-27 ENCOUNTER — Ambulatory Visit (INDEPENDENT_AMBULATORY_CARE_PROVIDER_SITE_OTHER): Payer: Medicare Other | Admitting: Interventional Cardiology

## 2015-11-27 ENCOUNTER — Encounter: Payer: Self-pay | Admitting: Interventional Cardiology

## 2015-11-27 VITALS — BP 119/72 | HR 52 | Ht 72.0 in | Wt 202.0 lb

## 2015-11-27 DIAGNOSIS — E785 Hyperlipidemia, unspecified: Secondary | ICD-10-CM

## 2015-11-27 DIAGNOSIS — I2510 Atherosclerotic heart disease of native coronary artery without angina pectoris: Secondary | ICD-10-CM | POA: Diagnosis not present

## 2015-11-27 DIAGNOSIS — R001 Bradycardia, unspecified: Secondary | ICD-10-CM | POA: Diagnosis not present

## 2015-11-27 DIAGNOSIS — R030 Elevated blood-pressure reading, without diagnosis of hypertension: Secondary | ICD-10-CM | POA: Diagnosis not present

## 2015-11-27 NOTE — Progress Notes (Signed)
Patient ID: [name], male   DOB: June 16, 1946, 70 y.o.   MRN: [id number]     Cardiology Office Note   Date:  11/27/2015   ID:  [name], DOB Sep 04, 1945, MRN [id number]  PCP:   Melinda Crutch, MD    Chief Complaint  Patient presents with  . Follow-up   CAD  Wt Readings from Last 3 Encounters:  11/27/15 202 lb (91.627 kg)  11/14/14 208 lb (94.348 kg)  11/06/13 214 lb (97.07 kg)       History of Present Illness: [name] is a 70 y.o. male  who presents for evaluation of her CAD. He had CAD with a known CTO of the RCA.  He uses the elliptical and treadmill and coaches HS golf at [employer]. He has lost some weight since his last visit. He continues to have a low HR. His HR goes up to 95 bpm. CAD/ASCVD:  Denies : Chest pain.  Dizziness.  Dyspnea on exertion.  Leg edema.  Nitroglycerin.  Orthopnea.  Palpitations.  Syncope.   He has cut down on his carbs because his wife went gluten free.  He decreases portion size.  He is trying harder at the gym.    He is limited a little by foot pain.  He plays golf.  He uses a FitBit.   He had pneumonia in 12/16 while he was travelling in [place]- he was not hospitalized.  His BP has ben controlled for the last year.    His highest weight several years ago was 240 lbs.     Past Medical History  Diagnosis Date  . CAD (coronary artery disease)   . GAD (generalized anxiety disorder)   . Hyperlipidemia   . Overweight(278.02)     Past Surgical History  Procedure Laterality Date  . Tonsillectomy  1955  . Cardiac catheterization  2005  . Anal fissure repair    . Skin cancer excision    . Umbilical hernia repair  10/07/2011    Procedure: HERNIA REPAIR UMBILICAL ADULT;  Surgeon: Rolm Bookbinder, MD;  Location: [facility];  Service: General;  Laterality: N/A;  with mesh      Current Outpatient Prescriptions  Medication Sig Dispense Refill  . aspirin 81 MG tablet Take 81 mg by mouth daily.     . B Complex Vitamins  (VITAMIN B COMPLEX PO) Take by mouth.    . clindamycin (CLEOCIN) 300 MG capsule Take 1 capsule (300 mg total) by mouth 3 (three) times daily. 42 capsule 0  . clonazePAM (KLONOPIN) 0.5 MG tablet   0  . Coenzyme Q10 (COQ10 PO) Take by mouth daily.    . Lactobacillus (ACIDOPHILUS PO) Take by mouth.    . Multiple Vitamins-Minerals (CENTRUM CARDIO PO) Take by mouth.    . Omega-3 Fatty Acids (OMEGA 3 PO) Take 3,840 mg by mouth 2 (two) times daily. Nordic Natural ultimate Omega 3    . Red Yeast Rice Extract (RED YEAST RICE PO) Take 500 mg by mouth 2 (two) times daily.    . rosuvastatin (CRESTOR) 40 MG tablet Take 1 tablet (40 mg total) by mouth daily. 90 tablet 1   No current facility-administered medications for this visit.    Allergies:   Penicillins    Social History:  The patient  reports that he quit smoking about 30 years ago. He does not have any smokeless tobacco history on file. He reports that he drinks alcohol. He reports that he does not use illicit  drugs.   Family History:  The patient's family history includes Cancer in his sister; Heart attack in his father; Heart disease in his father; Stroke in his father.    ROS:  Please see the history of present illness.   Otherwise, review of systems are positive for occasional foot pain.   All other systems are reviewed and negative.    PHYSICAL EXAM: VS:  BP 119/72 mmHg  Pulse 52  Ht 6' (1.829 m)  Wt 202 lb (91.627 kg)  BMI 27.39 kg/m2 , BMI Body mass index is 27.39 kg/(m^2). GEN: Well nourished, well developed, in no acute distress HEENT: normal Neck: no JVD, carotid bruits, or masses Cardiac: RRR; no murmurs, rubs, or gallops,no edema  Respiratory:  clear to auscultation bilaterally, normal work of breathing GI: soft, nontender, nondistended, + BS MS: no deformity or atrophy Skin: warm and dry, no rash Neuro:  Strength and sensation are intact Psych: euthymic mood, full affect   EKG:   The ekg ordered today demonstrates  Sinus bradycardia, prolonged PR interval   Recent Labs: 06/05/2015: ALT 20   Lipid Panel    Component Value Date/Time   CHOL 129 06/05/2015 0827   CHOL 117 02/06/2015 0853   TRIG 127 06/05/2015 0827   TRIG 120 02/06/2015 0853   HDL 41 06/05/2015 0827   HDL 35* 02/06/2015 0853   CHOLHDL 3.1 06/05/2015 0827   LDLCALC 63 06/05/2015 0827   LDLCALC 58 02/06/2015 0853     Other studies Reviewed: Additional studies/ records that were reviewed today with results demonstrating: .   ASSESSMENT AND PLAN:  1. CAD: No angina.  Continue aggressive secondary prevention.  2. Hyperlipidemia: Continue Crestor.  Labs were checked Monday. 3. Bradycardia: No sx of bradycardia.  No syncope.  4. Elevated BP in the past.  BP better with weight loss.  Continue current lifestyle modifications.    Current medicines are reviewed at length with the patient today.  The patient concerns regarding his medicines were addressed.  The following changes have been made:  No change  Labs/ tests ordered today include: labs checked at [facility] No orders of the defined types were placed in this encounter.    Recommend 150 minutes/week of aerobic exercise Low fat, low carb, high fiber diet recommended  Disposition:   FU in 1 year   Teresita Madura., MD  11/27/2015 8:29 AM    [facility] [place], [place], [place]  [address] Phone: [phone]; Fax: [phone]

## 2015-11-27 NOTE — Patient Instructions (Signed)
Medication Instructions:  Your physician recommends that you continue on your current medications as directed. Please refer to the Current Medication list given to you today.   Labwork: NONE  Testing/Procedures: NONE  Follow-Up: Your physician wants you to follow-up in: [place] DR. VARANASI.  You will receive a reminder letter in the mail two months in advance. If you don't receive a letter, please call our office to schedule the follow-up appointment.   Any Other Special Instructions Will Be Listed Below (If Applicable).     If you need a refill on your cardiac medications before your next appointment, please call your pharmacy.

## 2015-12-02 LAB — CARDIO IQ(R) ADVANCED LIPID PANEL
Apolipoprotein B: 61 mg/dL (ref 52–109)
CHOLESTEROL, TOTAL (CARDIO IQ ADV LIPID PANEL): 118 mg/dL — AB (ref 125–200)
CHOLESTEROL/HDL RATIO (CARDIO IQ ADV LIPID PANEL): 3.2 calc (ref <=5.0)
HDL Cholesterol: 37 mg/dL — ABNORMAL LOW (ref >=40)
LDL CHOLESTEROL CALCULATED (CARDIO IQ ADV LIPID PANEL): 50 mg/dL
LDL LARGE: 5626 nmol/L (ref 4334–10815)
LDL MEDIUM: 144 nmol/L — AB (ref 167–465)
LDL PARTICLE NUMBER: 1048 nmol/L (ref 1016–2185)
LDL PEAK SIZE: 206.8 Angstrom — AB (ref >=218.2)
LDL SMALL: 197 nmol/L (ref 123–441)
Lipoprotein (a): 247 nmol/L — ABNORMAL HIGH (ref <75)
Non-HDL Cholesterol: 81 mg/dL
Triglycerides: 155 mg/dL — ABNORMAL HIGH

## 2015-12-04 ENCOUNTER — Encounter: Payer: Self-pay | Admitting: Interventional Cardiology

## 2015-12-05 ENCOUNTER — Other Ambulatory Visit: Payer: Self-pay

## 2015-12-05 DIAGNOSIS — E785 Hyperlipidemia, unspecified: Secondary | ICD-10-CM

## 2015-12-05 MED ORDER — EZETIMIBE 10 MG PO TABS: 10.0000 mg | ORAL_TABLET | Freq: Every day | ORAL | Status: DC

## 2015-12-27 ENCOUNTER — Ambulatory Visit (INDEPENDENT_AMBULATORY_CARE_PROVIDER_SITE_OTHER): Payer: Medicare Other | Admitting: Podiatry

## 2015-12-27 DIAGNOSIS — M216X9 Other acquired deformities of unspecified foot: Secondary | ICD-10-CM

## 2015-12-27 DIAGNOSIS — Q828 Other specified congenital malformations of skin: Secondary | ICD-10-CM | POA: Diagnosis not present

## 2015-12-27 MED ORDER — NONFORMULARY OR COMPOUNDED ITEM: Status: DC

## 2015-12-27 NOTE — Addendum Note (Signed)
Addended by: Harriett Sine D on: 12/27/2015 04:24 PM   Modules accepted: Orders

## 2015-12-27 NOTE — Progress Notes (Signed)
Patient ID: [name], male   DOB: March 26, 1946, 70 y.o.   MRN: [id number]  Subjective: 70 year old male presents the office today for concerns of left submetatarsal 4 hyperkeratotic lesion as well as her pain to the toes and the right foot. He states he is tried changing shoes he is continuing the inserts. Denies any recent injury or trauma. No swelling or redness. No tingling or numbness to his toes. Denies any systemic complaints such as fevers, chills, nausea, vomiting. No acute changes since last appointment, and no other complaints at this time.   Objective: AAO x3, NAD DP/PT pulses palpable bilaterally, CRT less than 3 seconds Protective sensation intact with Simms Weinstein monofilament There is prominence submetatarsal heads plantarly with atrophy of the fat pad. There is hyperkeratotic lesion submetatarsal 4 left foot. Upon debridement no underlying ulceration, drainage or other signs of infection. There is no palpable neuroma identified on the right foot. There is no numbness or tingling to the toe. There some mild discomfort in the submetatarsal 2 and 3. No areas of pinpoint bony tenderness or pain with vibratory sensation. MMT 5/5, ROM WNL. No edema, erythema, increase in warmth to bilateral lower extremities.  No open lesions or pre-ulcerative lesions.  No pain with calf compression, swelling, warmth, erythema  Assessment: Metatarsalgia, hyperkeratotic lesion  Plan: -All treatment options discussed with the patient including all alternatives, risks, complications.  -Modify his right orthotic to add a metatarsal pad help take pressure off the area. Prescribed compound cream for anti-inflammatory to help not take as much ibuprofen for which he's been taking.  -Hyperkeratotic lesion debrided left foot. Area was cleaned. Pad placed around the lesion followed by salicylic acid and a bandage. Post procedure instructions discussed. Monitor for any clinical signs or symptoms of infection  and directed to call the office immediately should any occur or go to the ER. -Follow-up as scheduled or sooner if any problems arise. In the meantime, encouraged to call the office with any questions, concerns, change in symptoms.   Celesta Gentile, DPM

## 2016-01-24 ENCOUNTER — Other Ambulatory Visit: Payer: Self-pay | Admitting: Interventional Cardiology

## 2016-01-27 ENCOUNTER — Encounter: Payer: Self-pay | Admitting: Interventional Cardiology

## 2016-01-29 ENCOUNTER — Other Ambulatory Visit: Payer: Self-pay

## 2016-01-29 DIAGNOSIS — R0600 Dyspnea, unspecified: Secondary | ICD-10-CM

## 2016-02-03 ENCOUNTER — Other Ambulatory Visit (INDEPENDENT_AMBULATORY_CARE_PROVIDER_SITE_OTHER): Payer: Medicare Other | Admitting: *Deleted

## 2016-02-03 ENCOUNTER — Other Ambulatory Visit: Payer: Self-pay

## 2016-02-03 ENCOUNTER — Ambulatory Visit (HOSPITAL_COMMUNITY): Payer: Medicare Other | Attending: Cardiology

## 2016-02-03 DIAGNOSIS — R0600 Dyspnea, unspecified: Secondary | ICD-10-CM | POA: Insufficient documentation

## 2016-02-03 LAB — ECHOCARDIOGRAM COMPLETE
AVLVOTPG: 5 mmHg
CHL CUP DOP CALC LVOT VTI: 26.6 cm
CHL CUP MV DEC (S): 254
E/e' ratio: 5.78
EWDT: 254 ms
FS: 31 % (ref 28–44)
IVS/LV PW RATIO, ED: 0.89
LA ID, A-P, ES: 54 cm
LA vol: 61 cm3
LADIAMINDEX: 2.49 cm/m2
LAVOLA4C: 50 mL
LAVOLIN: 28.1 mL/m2
LEFT ATRIUM END SYS DIAM: 54 cm
LV E/e'average: 5.78
LVEEMED: 5.78
LVELAT: 10.5 cm/s
LVOT SV: 110 cm3
LVOT area: 4.15 cm2
LVOT diameter: 23 cm
LVOT peak vel: 107 m/s
MVPKAVEL: 67.5 m/s
MVPKEVEL: 60.7 m/s
PW: 11 mm — AB (ref 0.6–1.1)
TDI e' lateral: 10.5
TDI e' medial: 7.61

## 2016-02-03 LAB — BASIC METABOLIC PANEL
BUN: 16 mg/dL (ref 7–25)
CO2: 23 mmol/L (ref 20–31)
Calcium: 8.8 mg/dL (ref 8.6–10.3)
Chloride: 107 mmol/L (ref 98–110)
Creat: 0.89 mg/dL (ref 0.70–1.18)
GLUCOSE: 99 mg/dL (ref 65–99)
POTASSIUM: 4.5 mmol/L (ref 3.5–5.3)
SODIUM: 141 mmol/L (ref 135–146)

## 2016-02-03 LAB — BRAIN NATRIURETIC PEPTIDE: BRAIN NATRIURETIC PEPTIDE: 63.1 pg/mL (ref <100)

## 2016-06-05 ENCOUNTER — Other Ambulatory Visit: Payer: Medicare Other

## 2016-06-12 ENCOUNTER — Other Ambulatory Visit: Payer: Medicare Other | Admitting: *Deleted

## 2016-06-12 DIAGNOSIS — E785 Hyperlipidemia, unspecified: Secondary | ICD-10-CM

## 2016-06-12 LAB — HEPATIC FUNCTION PANEL
ALT: 20 U/L (ref 9–46)
AST: 25 U/L (ref 10–35)
Albumin: 3.9 g/dL (ref 3.6–5.1)
Alkaline Phosphatase: 51 U/L (ref 40–115)
BILIRUBIN DIRECT: 0.3 mg/dL — AB (ref <=0.2)
BILIRUBIN INDIRECT: 0.9 mg/dL (ref 0.2–1.2)
TOTAL PROTEIN: 6 g/dL — AB (ref 6.1–8.1)
Total Bilirubin: 1.2 mg/dL (ref 0.2–1.2)

## 2016-06-16 LAB — CARDIO IQ(R) ADVANCED LIPID PANEL
APOLIPOPROTEIN (CARDIO IQ ADV LIPID PANEL): 54 mg/dL (ref 52–109)
CHOLESTEROL, TOTAL (CARDIO IQ ADV LIPID PANEL): 101 mg/dL (ref <200)
Cholesterol/HDL Ratio: 2.7 calc (ref <5.0)
HDL Cholesterol: 37 mg/dL — ABNORMAL LOW (ref >40)
LDL LARGE: 5027 nmol/L (ref 4334–10815)
LDL MEDIUM: 136 nmol/L — AB (ref 167–465)
LDL PARTICLE NUMBER: 630 nmol/L — AB (ref 1016–2185)
LDL Peak Size: 214.4 Angstrom — ABNORMAL LOW (ref >=218.2)
LDL SMALL: 130 nmol/L (ref 123–441)
LDL, Calculated: 47 mg/dL (ref <100)
LIPOPROTEIN (A) (CARDIO IQ ADV LIPID PANEL): 277 nmol/L — AB (ref <75)
Non-HDL Cholesterol: 64 mg/dL (calc) (ref <130)
Triglycerides: 86 mg/dL (ref <150)

## 2016-06-22 ENCOUNTER — Other Ambulatory Visit: Payer: Self-pay | Admitting: *Deleted

## 2016-06-22 MED ORDER — EZETIMIBE 10 MG PO TABS: 10.0000 mg | ORAL_TABLET | Freq: Every day | ORAL | 1 refills | Status: DC

## 2016-09-23 ENCOUNTER — Encounter: Payer: Self-pay | Admitting: *Deleted

## 2016-09-24 ENCOUNTER — Ambulatory Visit (INDEPENDENT_AMBULATORY_CARE_PROVIDER_SITE_OTHER)
Admission: RE | Admit: 2016-09-24 | Discharge: 2016-09-24 | Disposition: A | Discharge status: Home / Self Care | Payer: Medicare Other | Source: Ambulatory Visit | Attending: Emergency Medicine | Admitting: Emergency Medicine

## 2016-09-24 ENCOUNTER — Encounter: Payer: Self-pay | Admitting: Emergency Medicine

## 2016-09-24 ENCOUNTER — Other Ambulatory Visit: Payer: Medicare Other

## 2016-09-24 ENCOUNTER — Ambulatory Visit (INDEPENDENT_AMBULATORY_CARE_PROVIDER_SITE_OTHER): Payer: Medicare Other | Admitting: Emergency Medicine

## 2016-09-24 VITALS — BP 118/84 | HR 58 | Ht 72.0 in | Wt 190.0 lb

## 2016-09-24 DIAGNOSIS — I272 Pulmonary hypertension, unspecified: Secondary | ICD-10-CM | POA: Diagnosis not present

## 2016-09-24 DIAGNOSIS — R0609 Other forms of dyspnea: Secondary | ICD-10-CM

## 2016-09-24 NOTE — Patient Instructions (Addendum)
We will perform pulmonary function testing  We will perform a CXR  We will perform blood work today We will repeat your echocardiogram to compare with 2017 Depending on results we may decide to have further cardiology evaluation.  Follow with Dr Lamonte Sakai next available following your testing to review the results

## 2016-09-24 NOTE — Progress Notes (Signed)
Subjective:    Patient ID: [name], male    DOB: [birth year], 71 y.o.   MRN: [id number]  HPI 71 year old former smoker (approximately 20 pack years) with a history of coronary artery disease, hyperlipidemia, obesity. Referred for exertional dyspnea. Last Cards eval was in 11/27/15, was doing well from cards standpoint at that time. The shortness of breath has been fairly consistent but low level for last 10 years. He was treated for a CAP about a year ago as an outpatient, unclear which side. He believes that he has been worse ever since.  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 In Nov 2017 he had some sensitivity to Leaves / dust > cough, dyspnea. Also has noticed some cold air sensitivity. He was given albuterol > tried to use pre-exertion, didn't notice that it changed anything. Some days he gets SOB with climbing stairs or doing housework, sometimes not. He walks on treadmill at the gym a few days a week.   Has been exercising and has lost about 40 lbs over last 8 yrs.  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  Over the last 6 months he has noticed some difficulty swallowing, getting pills to go down    TTE 02/03/16 >> normal left ventricular and right ventricular function.  Review of Systems  Constitutional: Negative for fever and unexpected weight change.  HENT: Negative for congestion, dental problem, ear pain, nosebleeds, postnasal drip, rhinorrhea, sinus pressure, sneezing, sore throat and trouble swallowing.   Eyes: Negative for redness and itching.  Respiratory: Positive for cough and shortness of breath. Negative for chest tightness and wheezing.   Cardiovascular: Negative for palpitations and leg swelling.  Gastrointestinal: Negative for nausea and vomiting.  Genitourinary: Negative for dysuria.  Musculoskeletal: Negative for joint swelling.  Skin: Negative for  rash.  Neurological: Negative for headaches.  Hematological: Does not bruise/bleed easily.  Psychiatric/Behavioral: Negative for dysphoric mood. The patient is not nervous/anxious.     Past Medical History:  Diagnosis Date  . CAD (coronary artery disease)   . GAD (generalized anxiety disorder)   . Hyperlipidemia   . Overweight(278.02)      Family History  Problem Relation Age of Onset  . Cancer Sister     breast  . Heart disease Father   . Heart attack Father   . Stroke Father      Social History   Social History  . Marital status: Married    Spouse name: N/A  . Number of children: N/A  . Years of education: N/A   Occupational History  . Not on file.   Social History Main Topics  . Smoking status: Former Smoker    Packs/day: 1.00    Years: 18.00    Quit date: 09/27/1985  . Smokeless tobacco: Never Used  . Alcohol use Yes  . Drug use: No  . Sexual activity: Not on file   Other Topics Concern  . Not on file   Social History Narrative  . No narrative on file     Allergies  Allergen Reactions  . Penicillins Hives     Outpatient Medications Prior to Visit  Medication Sig Dispense Refill  . albuterol (PROVENTIL HFA;VENTOLIN HFA) 108 (90 Base) MCG/ACT inhaler Inhale 2 puffs into the lungs every 4 (four) hours as needed for wheezing or shortness of breath.    Marland Kitchen aspirin 81  MG tablet Take 81 mg by mouth daily.     . B Complex Vitamins (VITAMIN B COMPLEX PO) Take 1 tablet by mouth daily.     . Capsicum, Cayenne, (CAYENNE PO) Take 500 mg by mouth daily.    . clonazePAM (KLONOPIN) 0.5 MG tablet Take 0.5 mg by mouth 2 (two) times daily.   0  . Coenzyme Q10 (COQ10 PO) Take 1 tablet by mouth daily.     Marland Kitchen ezetimibe (ZETIA) 10 MG tablet Take 1 tablet (10 mg total) by mouth daily. 90 tablet 1  . Lactobacillus (ACIDOPHILUS PO) Take 1 capsule by mouth daily.     . Multiple Vitamins-Minerals (CENTRUM CARDIO PO) Take 1 tablet by mouth daily.     . Omega-3 Fatty Acids (OMEGA  3 PO) Take 3,840 mg by mouth 2 (two) times daily. Nordic Natural ultimate Omega 3    . rosuvastatin (CRESTOR) 40 MG tablet TAKE 1 TABLET(40 MG) BY MOUTH DAILY 90 tablet 2   No facility-administered medications prior to visit.         Objective:   Physical Exam Vitals:   09/24/16 1132  BP: 118/84  Pulse: (!) 58  SpO2: 95%  Weight: 190 lb (86.2 kg)  Height: 6' (1.829 m)  Gen: Pleasant, well-nourished, in no distress,  normal affect  ENT: No lesions,  mouth clear,  oropharynx clear, no postnasal drip  Neck: No JVD, no TMG, no carotid bruits  Lungs: No use of accessory muscles, clear without rales or rhonchi  Cardiovascular: RRR, heart sounds normal, no murmur or gallops, no peripheral edema  Musculoskeletal: No deformities, no clubbing but fingers are cool, dusky. Not tender to touch  Neuro: alert, non focal  Skin: Warm, no lesions or rashes     10/07/11 --  Clinical Data: Hypoxia  PORTABLE CHEST - 1 VIEW  Comparison: Chest radiograph 10/07/2011  Findings: Heart silhouette is mildly enlarged.  There is central venous pulmonary congestion which is slightly increased compared to prior.  There is a mild interstitial edema pattern and the lower lobes. No new  IMPRESSION: Cardiomegaly and mild interstitial edema appears increased.     Assessment & Plan:  Dyspnea on exertion Unclear etiology. He does have a history of coronary disease and hyperlipidemia but his most recent cardiology evaluation was less than a year ago and he was doing well at that time. Difficult to ascribe his dyspnea to deconditioning as he has been working on exercise and has lost aproximally 40 pounds over the last 8 years. Does relate his change in breathing to the pneumonia that he had just over 1 year ago. The findings of dysphagia and Raynaud's phenomenon are suggestive of possible autoimmune disease, scleroderma. There was no evidence of pulmonary hypertension on his most recent echocardiogram  but I would like to repeat this study to ensure no change in PASP. I will check ANA, a-scL-70, RF. Check full PFT as some aspects of the history suggest possible obstructive lung disease. CXR today, ? Some residual restriction post-PNA last year. Depending on the workup, we may decide to pursue a CPST.   Baltazar Apo, MD, PhD 09/24/2016, 1:10 PM [facility] [phone] or if no answer [phone]

## 2016-09-24 NOTE — Assessment & Plan Note (Signed)
Unclear etiology. He does have a history of coronary disease and hyperlipidemia but his most recent cardiology evaluation was less than a year ago and he was doing well at that time. Difficult to ascribe his dyspnea to deconditioning as he has been working on exercise and has lost aproximally 40 pounds over the last 8 years. Does relate his change in breathing to the pneumonia that he had just over 1 year ago. The findings of dysphagia and Raynaud's phenomenon are suggestive of possible autoimmune disease, scleroderma. There was no evidence of pulmonary hypertension on his most recent echocardiogram but I would like to repeat this study to ensure no change in PASP. I will check ANA, a-scL-70, RF. Check full PFT as some aspects of the history suggest possible obstructive lung disease. CXR today, ? Some residual restriction post-PNA last year. Depending on the workup, we may decide to pursue a CPST.

## 2016-09-25 ENCOUNTER — Institutional Professional Consult (permissible substitution): Payer: Medicare Other | Admitting: Emergency Medicine

## 2016-09-25 LAB — RHEUMATOID FACTOR

## 2016-09-25 LAB — ANA: ANA: POSITIVE — AB

## 2016-09-25 LAB — ANTI-SCLERODERMA ANTIBODY: SCLERODERMA (SCL-70) (ENA) ANTIBODY, IGG: NEGATIVE

## 2016-09-25 LAB — ANTI-NUCLEAR AB-TITER (ANA TITER): ANA Titer 1: 1:40 {titer} — ABNORMAL HIGH

## 2016-09-29 ENCOUNTER — Ambulatory Visit (INDEPENDENT_AMBULATORY_CARE_PROVIDER_SITE_OTHER): Payer: Medicare Other | Admitting: Emergency Medicine

## 2016-09-29 DIAGNOSIS — I272 Pulmonary hypertension, unspecified: Secondary | ICD-10-CM | POA: Diagnosis not present

## 2016-09-29 LAB — PULMONARY FUNCTION TEST
DL/VA % pred: 27 %
DL/VA: 1.29 ml/min/mmHg/L
DLCO UNC: 8.53 ml/min/mmHg
DLCO cor % pred: 23 %
DLCO cor: 8.08 ml/min/mmHg
DLCO unc % pred: 24 %
FEF 25-75 Post: 2.03 L/sec
FEF 25-75 Pre: 1.28 L/sec
FEF2575-%Change-Post: 59 %
FEF2575-%Pred-Post: 77 %
FEF2575-%Pred-Pre: 48 %
FEV1-%CHANGE-POST: 13 %
FEV1-%PRED-POST: 85 %
FEV1-%Pred-Pre: 75 %
FEV1-POST: 2.96 L
FEV1-Pre: 2.61 L
FEV1FVC-%Change-Post: 2 %
FEV1FVC-%Pred-Pre: 86 %
FEV6-%Change-Post: 9 %
FEV6-%PRED-POST: 99 %
FEV6-%Pred-Pre: 91 %
FEV6-POST: 4.46 L
FEV6-PRE: 4.07 L
FEV6FVC-%CHANGE-POST: 0 %
FEV6FVC-%PRED-POST: 102 %
FEV6FVC-%PRED-PRE: 103 %
FVC-%CHANGE-POST: 10 %
FVC-%Pred-Post: 96 %
FVC-%Pred-Pre: 87 %
FVC-Post: 4.58 L
FVC-Pre: 4.15 L
POST FEV6/FVC RATIO: 97 %
PRE FEV6/FVC RATIO: 98 %
Post FEV1/FVC ratio: 65 %
Pre FEV1/FVC ratio: 63 %
RV % PRED: 90 %
RV: 2.33 L
TLC % PRED: 92 %
TLC: 6.84 L

## 2016-10-14 ENCOUNTER — Other Ambulatory Visit: Payer: Self-pay

## 2016-10-14 ENCOUNTER — Ambulatory Visit (HOSPITAL_COMMUNITY): Payer: Medicare Other | Attending: Cardiology

## 2016-10-14 DIAGNOSIS — I071 Rheumatic tricuspid insufficiency: Secondary | ICD-10-CM | POA: Diagnosis not present

## 2016-10-14 DIAGNOSIS — E785 Hyperlipidemia, unspecified: Secondary | ICD-10-CM | POA: Diagnosis not present

## 2016-10-14 DIAGNOSIS — I2510 Atherosclerotic heart disease of native coronary artery without angina pectoris: Secondary | ICD-10-CM | POA: Insufficient documentation

## 2016-10-14 DIAGNOSIS — I272 Pulmonary hypertension, unspecified: Secondary | ICD-10-CM | POA: Insufficient documentation

## 2016-10-16 ENCOUNTER — Telehealth: Payer: Self-pay | Admitting: Emergency Medicine

## 2016-10-16 NOTE — Telephone Encounter (Signed)
Scheduled pt appt with RB on 3.2.18. Pt was advised to f/u after test were complete. Pt questioning if he should be worked in sooner.

## 2016-10-19 NOTE — Telephone Encounter (Signed)
As soon as I have an opening - that may be the earliest we have. We could try to call him if someone cancels

## 2016-10-19 NOTE — Telephone Encounter (Signed)
lmtcb X1 for pt  

## 2016-10-21 ENCOUNTER — Other Ambulatory Visit: Payer: Self-pay | Admitting: Interventional Cardiology

## 2016-10-22 NOTE — Telephone Encounter (Signed)
Spoke with pt. He is aware that [place] does not have any opens before 10/30/16. Pt will call to see if anyone has canceled next week. Nothing further was needed.

## 2016-10-30 ENCOUNTER — Ambulatory Visit (INDEPENDENT_AMBULATORY_CARE_PROVIDER_SITE_OTHER): Payer: Medicare Other | Admitting: Emergency Medicine

## 2016-10-30 ENCOUNTER — Encounter: Payer: Self-pay | Admitting: Emergency Medicine

## 2016-10-30 ENCOUNTER — Other Ambulatory Visit: Payer: Medicare Other

## 2016-10-30 DIAGNOSIS — R0609 Other forms of dyspnea: Secondary | ICD-10-CM | POA: Diagnosis not present

## 2016-10-30 MED ORDER — BUDESONIDE-FORMOTEROL FUMARATE 160-4.5 MCG/ACT IN AERO: 2.0000 | INHALATION_SPRAY | Freq: Two times a day (BID) | RESPIRATORY_TRACT | 0 refills | Status: DC

## 2016-10-30 NOTE — Addendum Note (Signed)
Addended by: Clayborne Dana C on: 10/30/2016 01:25 PM   Modules accepted: Orders

## 2016-10-30 NOTE — Progress Notes (Signed)
Subjective:    Patient ID: [name], male    DOB: Nov 12, 1945, 71 y.o.   MRN: [id number]  HPI 71 year old former smoker (approximately 20 pack years) with a history of coronary artery disease, hyperlipidemia, obesity. Referred for exertional dyspnea. Last Cards eval was in 11/27/15, was doing well from cards standpoint at that time. The shortness of breath has been fairly consistent but low level for last 10 years. He was treated for a CAP about a year ago as an outpatient, unclear which side. He believes that he has been worse ever since.  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 In Nov 2017 he had some sensitivity to Leaves / dust > cough, dyspnea. Also has noticed some cold air sensitivity. He was given albuterol > tried to use pre-exertion, didn't notice that it changed anything. Some days he gets SOB with climbing stairs or doing housework, sometimes not. He walks on treadmill at the gym a few days a week.   Has been exercising and has lost about 40 lbs over last 8 yrs.  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  Over the last 6 months he has noticed some difficulty swallowing, getting pills to go down   TTE 02/03/16 >> normal left ventricular and right ventricular function.  ROV 10/30/16 -- this is a f/u visit for SOB of unclear etiology. Has hx CAD, but reassuring recent cards eval. I repeated a TTE >> showed good LV fxn, some evidence hypertensive changes, moderately dilated LA, moderately increased PASP, normal RA size, normal RV size and function. Autoimmune labs were done, shows ANA positive at 1:40, a-scl-70 negative. Full PFT were done and I have reviewed, show moderate obstruction, positive BD response, normal volumes, severely decreased diffusion.  CXR normal on 09/24/16.    He notes that the is on crestor, zetia, clonazepam, can all cause side effect of dyspnea.   Review  of Systems  Constitutional: Negative for fever and unexpected weight change.  HENT: Negative for congestion, dental problem, ear pain, nosebleeds, postnasal drip, rhinorrhea, sinus pressure, sneezing, sore throat and trouble swallowing.   Eyes: Negative for redness and itching.  Respiratory: Positive for cough and shortness of breath. Negative for chest tightness and wheezing.   Cardiovascular: Negative for palpitations and leg swelling.  Gastrointestinal: Negative for nausea and vomiting.  Genitourinary: Negative for dysuria.  Musculoskeletal: Negative for joint swelling.  Skin: Negative for rash.  Neurological: Negative for headaches.  Hematological: Does not bruise/bleed easily.  Psychiatric/Behavioral: Negative for dysphoric mood. The patient is not nervous/anxious.     Past Medical History:  Diagnosis Date  . CAD (coronary artery disease)   . GAD (generalized anxiety disorder)   . Hyperlipidemia   . Overweight(278.02)      Family History  Problem Relation Age of Onset  . Cancer Sister     breast  . Heart disease Father   . Heart attack Father   . Stroke Father      Social History   Social History  . Marital status: Married    Spouse name: N/A  . Number of children: N/A  . Years of education: N/A   Occupational History  . Not on file.   Social History Main Topics  . Smoking status: Former Smoker    Packs/day: 1.00    Years: 18.00    Quit date: 09/27/1985  .  Smokeless tobacco: Never Used  . Alcohol use Yes  . Drug use: No  . Sexual activity: Not on file   Other Topics Concern  . Not on file   Social History Narrative  . No narrative on file     Allergies  Allergen Reactions  . Penicillins Hives     Outpatient Medications Prior to Visit  Medication Sig Dispense Refill  . albuterol (PROVENTIL HFA;VENTOLIN HFA) 108 (90 Base) MCG/ACT inhaler Inhale 2 puffs into the lungs every 4 (four) hours as needed for wheezing or shortness of breath.    Marland Kitchen aspirin  81 MG tablet Take 81 mg by mouth daily.     . B Complex Vitamins (VITAMIN B COMPLEX PO) Take 1 tablet by mouth daily.     . Capsicum, Cayenne, (CAYENNE PO) Take 500 mg by mouth daily.    . clonazePAM (KLONOPIN) 0.5 MG tablet Take 0.5 mg by mouth 2 (two) times daily.   0  . Coenzyme Q10 (COQ10 PO) Take 1 tablet by mouth daily.     Marland Kitchen ezetimibe (ZETIA) 10 MG tablet Take 1 tablet (10 mg total) by mouth daily. 90 tablet 1  . Lactobacillus (ACIDOPHILUS PO) Take 1 capsule by mouth daily.     . Multiple Vitamins-Minerals (CENTRUM CARDIO PO) Take 1 tablet by mouth daily.     . Omega-3 Fatty Acids (OMEGA 3 PO) Take 3,840 mg by mouth 2 (two) times daily. Nordic Natural ultimate Omega 3    . rosuvastatin (CRESTOR) 40 MG tablet TAKE 1 TABLET(40 MG) BY MOUTH DAILY 30 tablet 0   No facility-administered medications prior to visit.         Objective:   Physical Exam Vitals:   10/30/16 1214 10/30/16 1231  BP: 112/66   Pulse: (!) 54   SpO2: (!) 86% 97%  Weight: 188 lb 9.6 oz (85.5 kg)   Height: 6' (1.829 m)   Gen: Pleasant, well-nourished, in no distress,  normal affect  ENT: No lesions,  mouth clear,  oropharynx clear, no postnasal drip  Neck: No JVD, no TMG, no carotid bruits  Lungs: No use of accessory muscles, clear without rales or rhonchi  Cardiovascular: RRR, heart sounds normal, no murmur or gallops, no peripheral edema  Musculoskeletal: No deformities, no clubbing but fingers are cool, dusky. Not tender to touch  Neuro: alert, non focal  Skin: cool, some cyanotic changes B hands.      10/07/11 --  Clinical Data: Hypoxia  PORTABLE CHEST - 1 VIEW  Comparison: Chest radiograph 10/07/2011  Findings: Heart silhouette is mildly enlarged.  There is central venous pulmonary congestion which is slightly increased compared to prior.  There is a mild interstitial edema pattern and the lower lobes. No new  IMPRESSION: Cardiomegaly and mild interstitial edema appears  increased.     Assessment & Plan:  Dyspnea on exertion Complicated case of exertional SOB. He has obstruction on PFT w positive BD response - this is likely a component. He also has severe diffusion capacity and hypoxemia (but with raynaud phenomenon, so ? Accurate). Repeat echocardiogram shows a possible increased in pulmonary arterial pressures compared with June 2017, but RV and RA fxn and pressures appear to be normal. He has a slightly elevated ANA, significance unclear. He mentions additional concern that his crestor and zetia may be causing SOB.    We will perform a walking oximetry today on room air.  We will try starting Symbicort 2 puffs twice a day to see if this  helps your breathing.  We will need to consider whether to perform a right heart catherization at some point to measure your pulmonary arterial pressures.  We will recheck lab work today.  We will consider temporarily stopping your crestor and zetia depending on your breathing as we go forward.  Follow with Dr Lamonte Sakai in 1 month or next available.   Baltazar Apo, MD, PhD 10/30/2016, 1:05 PM [facility] [phone] or if no answer [phone]

## 2016-10-30 NOTE — Assessment & Plan Note (Signed)
Complicated case of exertional SOB. He has obstruction on PFT w positive BD response - this is likely a component. He also has severe diffusion capacity and hypoxemia (but with raynaud phenomenon, so ? Accurate). Repeat echocardiogram shows a possible increased in pulmonary arterial pressures compared with June 2017, but RV and RA fxn and pressures appear to be normal. He has a slightly elevated ANA, significance unclear. He mentions additional concern that his crestor and zetia may be causing SOB.    We will perform a walking oximetry today on room air.  We will try starting Symbicort 2 puffs twice a day to see if this helps your breathing.  We will need to consider whether to perform a right heart catherization at some point to measure your pulmonary arterial pressures.  We will recheck lab work today.  We will consider temporarily stopping your crestor and zetia depending on your breathing as we go forward.  Follow with Dr Lamonte Sakai in 1 month or next available.

## 2016-10-30 NOTE — Patient Instructions (Addendum)
We will perform a walking oximetry today on room air.  We will try starting Symbicort 2 puffs twice a day to see if this helps your breathing.  We will need to consider whether to perform a right heart catherization at some point to measure your pulmonary arterial pressures.  We will recheck lab work today.  We will consider temporarily stopping your crestor and zetia depending on your breathing as we go forward.  Follow with Dr Lamonte Sakai in 1 month or next available.

## 2016-10-30 NOTE — Addendum Note (Signed)
Addended by: Lorane Gell on: 10/30/2016 04:05 PM   Modules accepted: Orders

## 2016-11-02 LAB — ANTI-NUCLEAR AB-TITER (ANA TITER)

## 2016-11-02 LAB — ANA: ANA: POSITIVE — AB

## 2016-11-07 ENCOUNTER — Encounter: Payer: Self-pay | Admitting: Interventional Cardiology

## 2016-11-10 ENCOUNTER — Telehealth: Payer: Self-pay | Admitting: Interventional Cardiology

## 2016-11-10 ENCOUNTER — Telehealth: Payer: Self-pay | Admitting: Emergency Medicine

## 2016-11-10 NOTE — Telephone Encounter (Signed)
Patient scheduled on 3/15 at 10:00 AM with Dr. Irish Lack. Patient verbalized understanding.

## 2016-11-10 NOTE — Telephone Encounter (Signed)
I think he should stop the symbicort, insure that the hoarseness and UA symptoms resolve. Then we can sort out whether another inhaled med would be better choice.

## 2016-11-10 NOTE — Telephone Encounter (Signed)
Spoke with the pt and notified of recs per RB  He verbalized understanding and will call with update on how he is doing  Nothing further needed

## 2016-11-10 NOTE — Telephone Encounter (Signed)
Pt c/o worsening hoarseness, nonprod cough X approx 1 week.  S/s worsened after starting symbicort.  Dyspnea is unchanged since starting symbicort.  Pt states that he does rinse mouth out after using symbicort.  Requesting further recs.  RB please advise.  Thanks.

## 2016-11-10 NOTE — Telephone Encounter (Signed)
Follow Up:    Pt says he received an e-mail from Dr Irish Lack saying he was going to get him in sooner. Pt says he is waiting to hear something about new appointment date.F

## 2016-11-12 ENCOUNTER — Encounter: Payer: Self-pay | Admitting: *Deleted

## 2016-11-12 ENCOUNTER — Encounter: Payer: Self-pay | Admitting: Interventional Cardiology

## 2016-11-12 ENCOUNTER — Ambulatory Visit (INDEPENDENT_AMBULATORY_CARE_PROVIDER_SITE_OTHER): Payer: Medicare Other | Admitting: Interventional Cardiology

## 2016-11-12 VITALS — BP 100/76 | HR 53 | Ht 72.0 in | Wt 184.8 lb

## 2016-11-12 DIAGNOSIS — G4733 Obstructive sleep apnea (adult) (pediatric): Secondary | ICD-10-CM

## 2016-11-12 DIAGNOSIS — I25118 Atherosclerotic heart disease of native coronary artery with other forms of angina pectoris: Secondary | ICD-10-CM | POA: Diagnosis not present

## 2016-11-12 DIAGNOSIS — R0609 Other forms of dyspnea: Secondary | ICD-10-CM

## 2016-11-12 DIAGNOSIS — I10 Essential (primary) hypertension: Secondary | ICD-10-CM

## 2016-11-12 DIAGNOSIS — E782 Mixed hyperlipidemia: Secondary | ICD-10-CM | POA: Diagnosis not present

## 2016-11-12 DIAGNOSIS — Z01812 Encounter for preprocedural laboratory examination: Secondary | ICD-10-CM

## 2016-11-12 LAB — CBC
HEMATOCRIT: 49 % (ref 37.5–51.0)
Hemoglobin: 16.8 g/dL (ref 13.0–17.7)
MCH: 32.2 pg (ref 26.6–33.0)
MCHC: 34.3 g/dL (ref 31.5–35.7)
MCV: 94 fL (ref 79–97)
Platelets: 136 10*3/uL — ABNORMAL LOW (ref 150–379)
RBC: 5.21 x10E6/uL (ref 4.14–5.80)
RDW: 15.1 % (ref 12.3–15.4)
WBC: 7.1 10*3/uL (ref 3.4–10.8)

## 2016-11-12 LAB — BASIC METABOLIC PANEL
BUN / CREAT RATIO: 16 (ref 10–24)
BUN: 16 mg/dL (ref 8–27)
CO2: 21 mmol/L (ref 18–29)
CREATININE: 1.03 mg/dL (ref 0.76–1.27)
Calcium: 9 mg/dL (ref 8.6–10.2)
Chloride: 104 mmol/L (ref 96–106)
GFR calc non Af Amer: 73 mL/min/{1.73_m2} (ref >59)
GFR, EST AFRICAN AMERICAN: 84 mL/min/{1.73_m2} (ref >59)
Glucose: 86 mg/dL (ref 65–99)
Potassium: 4.8 mmol/L (ref 3.5–5.2)
Sodium: 143 mmol/L (ref 134–144)

## 2016-11-12 LAB — PROTIME-INR
INR: 1.1 (ref 0.8–1.2)
Prothrombin Time: 12 s (ref 9.1–12.0)

## 2016-11-12 NOTE — Progress Notes (Signed)
Patient ID: [name], male   DOB: 08/04/1946, 71 y.o.   MRN: [id number]     Cardiology Office Note   Date:  11/12/2016   ID:  [name], DOB 07/21/1946, MRN [id number]  PCP:  Alan Ross, MD    Chief Complaint  Patient presents with  . Follow-up    SOB   CAD  Wt Readings from Last 3 Encounters:  11/12/16 184 lb 12.8 oz (83.8 kg)  10/30/16 188 lb 9.6 oz (85.5 kg)  09/24/16 190 lb (86.2 kg)       History of Present Illness: [name] is a 71 y.o. male  who presents for evaluation of her CAD. He had CAD with a known CTO of the RCA.  He uses the elliptical and treadmill and coaches HS golf at [employer]. He has lost some weight since his last visit. He continues to have a low HR. His HR goes up to 95 bpm. CAD/ASCVD:  Denies : Chest pain. Dizziness. Leg edema. Nitroglycerin. Orthopnea. Palpitations. Syncope.   In the past, he cut down on his carbs because his wife went gluten free.  He decreases portion size.  He is trying harder at the gym.    He is limited a little by foot pain.  He plays golf.  He uses a FitBit.   He had pneumonia in 12/16 while he was travelling in [place]- he was not hospitalized.  His BP has been controlled for the last year.    His highest weight several years ago was 240 lbs.  He has continued to lose weight over the past year as well.    He reports DOE, which he Has had a low level for many years. Since his  community-acquired pneumonia in 2016, he feels that his shortness of breath is worse. It is worse with walking up hills on the golf course. He saw pulmonary and he was asked to see us to see whether or not there is a cardiac etiology of his dyspnea.  No chest pain but it has been some time since he had a cath.  CTO of RCA was noted at that time.  He was started on Symbicort and right heart cath was suggested.  He feels that he has never bounced back from the PNA.  Echo and BNP in 2017 were ok.  Pulmonary w/u and autoimmune w/u were not  revealing.  He tried stopping statins for a while, bu tno change in sx.  He has taken Symbicort but developed a cough.    The wife feels that his ability to walk has decreased.  He is not excited about the possibility of wearing oxygen.      Past Medical History:  Diagnosis Date  . CAD (coronary artery disease)   . GAD (generalized anxiety disorder)   . Hyperlipidemia   . Overweight(278.02)     Past Surgical History:  Procedure Laterality Date  . ANAL FISSURE REPAIR    . CARDIAC CATHETERIZATION  2005  . SKIN CANCER EXCISION    . TONSILLECTOMY  1955  . UMBILICAL HERNIA REPAIR  10/07/2011   Procedure: HERNIA REPAIR UMBILICAL ADULT;  Surgeon: Matthew Wakefield, MD;  Location: [facility];  Service: General;  Laterality: N/A;  with mesh      Current Outpatient Prescriptions  Medication Sig Dispense Refill  . albuterol (PROVENTIL HFA;VENTOLIN HFA) 108 (90 Base) MCG/ACT inhaler Inhale 2 puffs into the lungs every 4 (four) hours as needed for wheezing or shortness of   breath.    . aspirin 81 MG tablet Take 81 mg by mouth daily.     . B Complex Vitamins (VITAMIN B COMPLEX PO) Take 1 tablet by mouth daily.     . clonazePAM (KLONOPIN) 0.5 MG tablet Take 0.5 mg by mouth 2 (two) times daily.   0  . Coenzyme Q10 (COQ10 PO) Take 1 tablet by mouth daily.     . Lactobacillus (ACIDOPHILUS PO) Take 1 capsule by mouth daily.     . Multiple Vitamins-Minerals (CENTRUM CARDIO PO) Take 1 tablet by mouth daily.     . Omega-3 Fatty Acids (OMEGA 3 PO) Take 3,840 mg by mouth 2 (two) times daily. Nordic Natural ultimate Omega 3     No current facility-administered medications for this visit.     Allergies:   Penicillins    Social History:  The patient  reports that he quit smoking about 31 years ago. He has a 18.00 pack-year smoking history. He has never used smokeless tobacco. He reports that he drinks alcohol. He reports that he does not use drugs.   Family History:  The patient's family history includes  Cancer in his sister; Heart attack in his father; Heart disease in his father; Stroke in his father.    ROS:  Please see the history of present illness.   Otherwise, review of systems are positive for occasional foot pain, DOE.   All other systems are reviewed and negative.    PHYSICAL EXAM: VS:  BP 100/76   Pulse (!) 53   Ht 6' (1.829 m)   Wt 184 lb 12.8 oz (83.8 kg)   SpO2 (!) 88%   BMI 25.06 kg/m  , BMI Body mass index is 25.06 kg/m. GEN: Well nourished, well developed, in no acute distress  HEENT: normal  Neck: no JVD, carotid bruits, or masses Cardiac: RRR; no murmurs, rubs, or gallops,no edema  Respiratory:  clear to auscultation bilaterally, normal work of breathing GI: soft, nontender, nondistended, + BS MS: no deformity or atrophy  Skin: warm and dry, no rash Neuro:  Strength and sensation are intact Psych: euthymic mood, full affect   EKG:   The ekg ordered today demonstrates Sinus bradycardia, prolonged PR interval   Recent Labs: 02/03/2016: Brain Natriuretic Peptide 63.1; BUN 16; Creat 0.89; Potassium 4.5; Sodium 141 06/12/2016: ALT 20   Lipid Panel    Component Value Date/Time   CHOL 101 06/12/2016 0930   CHOL 117 02/06/2015 0853   TRIG 86 06/12/2016 0930   TRIG 120 02/06/2015 0853   HDL 37 (L) 06/12/2016 0930   HDL 35 (L) 02/06/2015 0853   CHOLHDL 2.7 06/12/2016 0930   LDLCALC 47 06/12/2016 0930   LDLCALC 58 02/06/2015 0853     Other studies Reviewed: Additional studies/ records that were reviewed today with results demonstrating: Nl LV function.  Increased PA pressures from 6/17 to 2/18.   ASSESSMENT AND PLAN:  1. CAD: No typical angina, but SHOB could be anginal equivalent.  Plan for left and right heart cath.  Risks and benefits explained.  All questions answered.  Continue aggressive secondary prevention.  2. Hyperlipidemia: Continue Crestor.  He had been of of it but no change in SHOB.  Restart lipid lowering therapy. Particle number < 700 in  9/17. 3. Bradycardia: No sx of bradycardia.  No syncope.  4. Elevated BP in the past.  BP better with weight loss.  Continue current lifestyle modifications.  5. Sx concerning for OSA.  WIll discuss with   Dr. Byrum sleep study as a possible case of pulmonary HTN.    Current medicines are reviewed at length with the patient today.  The patient concerns regarding his medicines were addressed.  The following changes have been made:  No change  Labs/ tests ordered today include: labs checked today for cath No orders of the defined types were placed in this encounter.   Recommend 150 minutes/week of aerobic exercise Low fat, low carb, high fiber diet recommended  Disposition:   FU in 1 year   Signed, Keyunna Coco, MD  11/12/2016 10:20 AM    [facility] [address], , [place]  [address] Phone: [phone]; Fax: [phone]     

## 2016-11-12 NOTE — Patient Instructions (Addendum)
Your physician recommends that you continue on your current medications as directed. Please refer to the Current Medication list given to you today.  Your physician recommends that you return for lab work today (CBC, BMET, INR/PT)  Your physician has requested that you have a cardiac catheterization. Cardiac catheterization is used to diagnose and/or treat various heart conditions. Doctors may recommend this procedure for a number of different reasons. The most common reason is to evaluate chest pain. Chest pain can be a symptom of coronary artery disease (CAD), and cardiac catheterization can show whether plaque is narrowing or blocking your heart's arteries. This procedure is also used to evaluate the valves, as well as measure the blood flow and oxygen levels in different parts of your heart. For further information please visit [email or web address]. Please follow instruction sheet, as given.

## 2016-11-18 ENCOUNTER — Encounter: Payer: Self-pay | Admitting: Interventional Cardiology

## 2016-11-19 ENCOUNTER — Other Ambulatory Visit: Payer: Self-pay | Admitting: Interventional Cardiology

## 2016-11-19 MED ORDER — ROSUVASTATIN CALCIUM 40 MG PO TABS
40.0000 mg | ORAL_TABLET | Freq: Every day | ORAL | 3 refills | Status: DC
Start: 2016-11-19 — End: 2017-12-11

## 2016-11-19 NOTE — Telephone Encounter (Signed)
OK to refill rosuvastatin

## 2016-11-19 NOTE — Telephone Encounter (Signed)
Refill for rosuvastatin (crestor) 40 mg daily should be okay per OV note on 11/12/16, the patient should restart. It looks like the order was cancelled on that day by CMA when patient reported not taking it.

## 2016-11-19 NOTE — Telephone Encounter (Signed)
[facility] pharmacy requesting a refill on Rosuvastatin 40 mg tablet. This medication is not on pt's med list. On the LOV on 11/12/16, Dr. Irish Lack stated for pt to continue on  Crestor. Please advise

## 2016-11-24 ENCOUNTER — Ambulatory Visit (HOSPITAL_COMMUNITY)
Admission: RE | Admit: 2016-11-24 | Discharge: 2016-11-24 | Disposition: A | Discharge status: Home / Self Care | Payer: Medicare Other | Source: Ambulatory Visit | Attending: Interventional Cardiology | Admitting: Interventional Cardiology

## 2016-11-24 ENCOUNTER — Encounter (HOSPITAL_COMMUNITY): Payer: Self-pay | Admitting: Interventional Cardiology

## 2016-11-24 ENCOUNTER — Encounter (HOSPITAL_COMMUNITY)
Admission: RE | Disposition: A | Discharge status: Home / Self Care | Payer: Self-pay | Source: Ambulatory Visit | Attending: Interventional Cardiology

## 2016-11-24 DIAGNOSIS — E785 Hyperlipidemia, unspecified: Secondary | ICD-10-CM | POA: Diagnosis not present

## 2016-11-24 DIAGNOSIS — I2582 Chronic total occlusion of coronary artery: Secondary | ICD-10-CM | POA: Diagnosis not present

## 2016-11-24 DIAGNOSIS — R0602 Shortness of breath: Secondary | ICD-10-CM

## 2016-11-24 DIAGNOSIS — Z880 Allergy status to penicillin: Secondary | ICD-10-CM | POA: Insufficient documentation

## 2016-11-24 DIAGNOSIS — I2510 Atherosclerotic heart disease of native coronary artery without angina pectoris: Secondary | ICD-10-CM | POA: Diagnosis present

## 2016-11-24 DIAGNOSIS — Z6825 Body mass index (BMI) 25.0-25.9, adult: Secondary | ICD-10-CM | POA: Diagnosis not present

## 2016-11-24 DIAGNOSIS — Z7982 Long term (current) use of aspirin: Secondary | ICD-10-CM | POA: Diagnosis not present

## 2016-11-24 DIAGNOSIS — F411 Generalized anxiety disorder: Secondary | ICD-10-CM | POA: Diagnosis not present

## 2016-11-24 DIAGNOSIS — I2583 Coronary atherosclerosis due to lipid rich plaque: Secondary | ICD-10-CM | POA: Diagnosis not present

## 2016-11-24 DIAGNOSIS — Z823 Family history of stroke: Secondary | ICD-10-CM | POA: Diagnosis not present

## 2016-11-24 DIAGNOSIS — I2721 Secondary pulmonary arterial hypertension: Secondary | ICD-10-CM | POA: Insufficient documentation

## 2016-11-24 DIAGNOSIS — Z87891 Personal history of nicotine dependence: Secondary | ICD-10-CM | POA: Insufficient documentation

## 2016-11-24 DIAGNOSIS — Z8249 Family history of ischemic heart disease and other diseases of the circulatory system: Secondary | ICD-10-CM | POA: Diagnosis not present

## 2016-11-24 DIAGNOSIS — E663 Overweight: Secondary | ICD-10-CM | POA: Insufficient documentation

## 2016-11-24 DIAGNOSIS — I2584 Coronary atherosclerosis due to calcified coronary lesion: Secondary | ICD-10-CM | POA: Insufficient documentation

## 2016-11-24 HISTORY — PX: RIGHT/LEFT HEART CATH AND CORONARY ANGIOGRAPHY: CATH118266

## 2016-11-24 LAB — POCT I-STAT 3, ART BLOOD GAS (G3+)
ACID-BASE DEFICIT: 3 mmol/L — AB (ref 0.0–2.0)
Bicarbonate: 20.7 mmol/L (ref 20.0–28.0)
O2 SAT: 90 %
TCO2: 22 mmol/L (ref 0–100)
pCO2 arterial: 32.2 mmHg (ref 32.0–48.0)
pH, Arterial: 7.416 (ref 7.350–7.450)
pO2, Arterial: 57 mmHg — ABNORMAL LOW (ref 83.0–108.0)

## 2016-11-24 LAB — POCT I-STAT 3, VENOUS BLOOD GAS (G3P V)
ACID-BASE DEFICIT: 2 mmol/L (ref 0.0–2.0)
Bicarbonate: 21.9 mmol/L (ref 20.0–28.0)
O2 Saturation: 67 %
PH VEN: 7.394 (ref 7.250–7.430)
TCO2: 23 mmol/L (ref 0–100)
pCO2, Ven: 35.9 mmHg — ABNORMAL LOW (ref 44.0–60.0)
pO2, Ven: 35 mmHg (ref 32.0–45.0)

## 2016-11-24 SURGERY — RIGHT/LEFT HEART CATH AND CORONARY ANGIOGRAPHY
Anesthesia: LOCAL

## 2016-11-24 MED ORDER — MIDAZOLAM HCL 2 MG/2ML IJ SOLN
INTRAMUSCULAR | Status: AC
  Filled 2016-11-24: qty 2

## 2016-11-24 MED ORDER — SODIUM CHLORIDE 0.9% FLUSH: 3.0000 mL | Freq: Two times a day (BID) | INTRAVENOUS | Status: DC

## 2016-11-24 MED ORDER — LIDOCAINE HCL (PF) 1 % IJ SOLN
INTRAMUSCULAR | Status: DC | PRN
  Administered 2016-11-24: 10 mL

## 2016-11-24 MED ORDER — HEPARIN (PORCINE) IN NACL 2-0.9 UNIT/ML-% IJ SOLN
INTRAMUSCULAR | Status: AC
  Filled 2016-11-24: qty 1000

## 2016-11-24 MED ORDER — HEPARIN (PORCINE) IN NACL 2-0.9 UNIT/ML-% IJ SOLN
INTRAMUSCULAR | Status: DC | PRN
  Administered 2016-11-24: 1000 mL

## 2016-11-24 MED ORDER — SODIUM CHLORIDE 0.9% FLUSH: 3.0000 mL | INTRAVENOUS | Status: DC | PRN

## 2016-11-24 MED ORDER — MIDAZOLAM HCL 2 MG/2ML IJ SOLN
INTRAMUSCULAR | Status: DC | PRN
  Administered 2016-11-24 (×2): 1 mg via INTRAVENOUS

## 2016-11-24 MED ORDER — IOPAMIDOL (ISOVUE-370) INJECTION 76%
INTRAVENOUS | Status: AC
  Filled 2016-11-24: qty 100

## 2016-11-24 MED ORDER — VERAPAMIL HCL 2.5 MG/ML IV SOLN
INTRAVENOUS | Status: DC | PRN
  Administered 2016-11-24: 10 mL via INTRA_ARTERIAL

## 2016-11-24 MED ORDER — SODIUM CHLORIDE 0.9 % IV SOLN: INTRAVENOUS | Status: DC

## 2016-11-24 MED ORDER — ASPIRIN 81 MG PO CHEW
CHEWABLE_TABLET | ORAL | Status: AC
  Administered 2016-11-24: 81 mg via ORAL
  Filled 2016-11-24: qty 1

## 2016-11-24 MED ORDER — IOPAMIDOL (ISOVUE-370) INJECTION 76%
INTRAVENOUS | Status: DC | PRN
  Administered 2016-11-24: 50 mL via INTRA_ARTERIAL

## 2016-11-24 MED ORDER — FENTANYL CITRATE (PF) 100 MCG/2ML IJ SOLN
INTRAMUSCULAR | Status: DC | PRN
  Administered 2016-11-24 (×2): 25 ug via INTRAVENOUS

## 2016-11-24 MED ORDER — SODIUM CHLORIDE 0.9 % IV SOLN: 250.0000 mL | INTRAVENOUS | Status: DC | PRN

## 2016-11-24 MED ORDER — ASPIRIN 81 MG PO CHEW
81.0000 mg | CHEWABLE_TABLET | ORAL | Status: AC
  Administered 2016-11-24: 81 mg via ORAL

## 2016-11-24 MED ORDER — LIDOCAINE HCL (PF) 1 % IJ SOLN
INTRAMUSCULAR | Status: AC
  Filled 2016-11-24: qty 30

## 2016-11-24 MED ORDER — FENTANYL CITRATE (PF) 100 MCG/2ML IJ SOLN
INTRAMUSCULAR | Status: AC
  Filled 2016-11-24: qty 2

## 2016-11-24 MED ORDER — HEPARIN SODIUM (PORCINE) 1000 UNIT/ML IJ SOLN
INTRAMUSCULAR | Status: DC | PRN
  Administered 2016-11-24: 4000 [IU] via INTRAVENOUS

## 2016-11-24 MED ORDER — HEPARIN SODIUM (PORCINE) 1000 UNIT/ML IJ SOLN
INTRAMUSCULAR | Status: AC
  Filled 2016-11-24: qty 1

## 2016-11-24 MED ORDER — SODIUM CHLORIDE 0.9 % WEIGHT BASED INFUSION
3.0000 mL/kg/h | INTRAVENOUS | Status: AC
  Administered 2016-11-24: 3 mL/kg/h via INTRAVENOUS

## 2016-11-24 MED ORDER — VERAPAMIL HCL 2.5 MG/ML IV SOLN
INTRAVENOUS | Status: AC
  Filled 2016-11-24: qty 2

## 2016-11-24 MED ORDER — SODIUM CHLORIDE 0.9 % WEIGHT BASED INFUSION: 1.0000 mL/kg/h | INTRAVENOUS | Status: DC

## 2016-11-24 SURGICAL SUPPLY — 11 items
CATH 5FR JL3.5 JR4 ANG PIG MP (CATHETERS) ×2 IMPLANT
CATH BALLN WEDGE 5F 110CM (CATHETERS) ×2 IMPLANT
DEVICE RAD COMP TR BAND LRG (VASCULAR PRODUCTS) ×2 IMPLANT
GLIDESHEATH SLEND SS 6F .021 (SHEATH) ×2 IMPLANT
GUIDEWIRE INQWIRE 1.5J.035X260 (WIRE) ×1 IMPLANT
INQWIRE 1.5J .035X260CM (WIRE) ×2
KIT HEART LEFT (KITS) ×2 IMPLANT
PACK CARDIAC CATHETERIZATION (CUSTOM PROCEDURE TRAY) ×2 IMPLANT
SHEATH FAST CATH BRACH 5F 5CM (SHEATH) ×2 IMPLANT
TRANSDUCER W/STOPCOCK (MISCELLANEOUS) ×2 IMPLANT
TUBING CIL FLEX 10 FLL-RA (TUBING) ×2 IMPLANT

## 2016-11-24 NOTE — Interval H&P Note (Signed)
Cath Lab Visit (complete for each Cath Lab visit)  Clinical Evaluation Leading to the Procedure:   ACS: No.  Non-ACS:    Anginal Classification: CCS III  Anti-ischemic medical therapy: Minimal Therapy (1 class of medications)  Non-Invasive Test Results: No non-invasive testing performed  Prior CABG: No previous CABG      History and Physical Interval Note:  11/24/2016 7:42 AM  [name]  has presented today for surgery, with the diagnosis of hp - cad  The various methods of treatment have been discussed with the patient and family. After consideration of risks, benefits and other options for treatment, the patient has consented to  Procedure(s): Right/Left Heart Cath and Coronary Angiography (N/A) as a surgical intervention .  The patient's history has been reviewed, patient examined, no change in status, stable for surgery.  I have reviewed the patient's chart and labs.  Questions were answered to the patient's satisfaction.     Larae Grooms

## 2016-11-24 NOTE — Discharge Instructions (Signed)
Radial Site Care °Refer to this sheet in the next few weeks. These instructions provide you with information about caring for yourself after your procedure. Your health care provider may also give you more specific instructions. Your treatment has been planned according to current medical practices, but problems sometimes occur. Call your health care provider if you have any problems or questions after your procedure. °What can I expect after the procedure? °After your procedure, it is typical to have the following: °· Bruising at the radial site that usually fades within 1-2 weeks. °· Blood collecting in the tissue (hematoma) that may be painful to the touch. It should usually decrease in size and tenderness within 1-2 weeks. °Follow these instructions at home: °· Take medicines only as directed by your health care provider. °· You may shower 24-48 hours after the procedure or as directed by your health care provider. Remove the bandage (dressing) and gently wash the site with Kayes soap and water. Pat the area dry with a clean towel. Do not rub the site, because this may cause bleeding. °· Do not take baths, swim, or use a hot tub until your health care provider approves. °· Check your insertion site every day for redness, swelling, or drainage. °· Do not apply powder or lotion to the site. °· Do not flex or bend the affected arm for 24 hours or as directed by your health care provider. °· Do not push or pull heavy objects with the affected arm for 24 hours or as directed by your health care provider. °· Do not lift over 10 lb (4.5 kg) for 5 days after your procedure or as directed by your health care provider. °· Ask your health care provider when it is okay to: °¨ Return to work or school. °¨ Resume usual physical activities or sports. °¨ Resume sexual activity. °· Do not drive home if you are discharged the same day as the procedure. Have someone else drive you. °· You may drive 24 hours after the procedure  unless otherwise instructed by your health care provider. °· Do not operate machinery or power tools for 24 hours after the procedure. °· If your procedure was done as an outpatient procedure, which means that you went home the same day as your procedure, a responsible adult should be with you for the first 24 hours after you arrive home. °· Keep all follow-up visits as directed by your health care provider. This is important. °Contact a health care provider if: °· You have a fever. °· You have chills. °· You have increased bleeding from the radial site. Hold pressure on the site. °Get help right away if: °· You have unusual pain at the radial site. °· You have redness, warmth, or swelling at the radial site. °· You have drainage (other than a small amount of blood on the dressing) from the radial site. °· The radial site is bleeding, and the bleeding does not stop after 30 minutes of holding steady pressure on the site. °· Your arm or hand becomes pale, cool, tingly, or numb. °This information is not intended to replace advice given to you by your health care provider. Make sure you discuss any questions you have with your health care provider. °Document Released: 09/19/2010 Document Revised: 01/23/2016 Document Reviewed: 03/05/2014 °Elsevier Interactive Patient Education © 2017 [employer] ° °

## 2016-11-24 NOTE — H&P (View-Only) (Signed)
Patient ID: [name], male   DOB: [birth year], 71 y.o.   MRN: [id number]     Cardiology Office Note   Date:  11/12/2016   ID:  [name], DOB 1946/06/29, MRN [id number]  PCP:  Melinda Crutch, MD    Chief Complaint  Patient presents with  . Follow-up    SOB   CAD  Wt Readings from Last 3 Encounters:  11/12/16 184 lb 12.8 oz (83.8 kg)  10/30/16 188 lb 9.6 oz (85.5 kg)  09/24/16 190 lb (86.2 kg)       History of Present Illness: [name] is a 71 y.o. male  who presents for evaluation of her CAD. He had CAD with a known CTO of the RCA.  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 He has lost some weight since his last visit. He continues to have a low HR. His HR goes up to 95 bpm. CAD/ASCVD:  Denies : Chest pain. Dizziness. Leg edema. Nitroglycerin. Orthopnea. Palpitations. Syncope.   In the past, he cut down on his carbs because his wife went gluten free.  He decreases portion size.  He is trying harder at the gym.    He is limited a little by foot pain.  He plays golf.  He uses a FitBit.   He had pneumonia in 12/16 while he was travelling in [place]- he was not hospitalized.  His BP has been controlled for the last year.    His highest weight several years ago was 240 lbs.  He has continued to lose weight over the past year as well.    He reports DOE, which he Has had a low level for many years. Since his  community-acquired pneumonia in 2016, he feels that his shortness of breath is worse. It is worse with walking [place]. He saw pulmonary and he was asked to see [place] to see whether or not there is a cardiac etiology of his dyspnea.  No chest pain but it has been some time since he had a cath.  CTO of RCA was noted at that time.  He was started on Symbicort and right heart cath was suggested.  He feels that he has never bounced back from the PNA.  Echo and BNP in 2017 were ok.  Pulmonary w/u and autoimmune w/u were not  revealing.  He tried stopping statins for a while, bu tno change in sx.  He has taken Symbicort but developed a cough.    The wife feels that his ability to walk has decreased.  He is not excited about the possibility of wearing oxygen.      Past Medical History:  Diagnosis Date  . CAD (coronary artery disease)   . GAD (generalized anxiety disorder)   . Hyperlipidemia   . Overweight(278.02)     Past Surgical History:  Procedure Laterality Date  . ANAL FISSURE REPAIR    . CARDIAC CATHETERIZATION  2005  . SKIN CANCER EXCISION    . TONSILLECTOMY  1955  . UMBILICAL HERNIA REPAIR  10/07/2011   Procedure: HERNIA REPAIR UMBILICAL ADULT;  Surgeon: Rolm Bookbinder, MD;  Location: [facility];  Service: General;  Laterality: N/A;  with mesh      Current Outpatient Prescriptions  Medication Sig Dispense Refill  . albuterol (PROVENTIL HFA;VENTOLIN HFA) 108 (90 Base) MCG/ACT inhaler Inhale 2 puffs into the lungs every 4 (four) hours as needed for wheezing or shortness of  breath.    Marland Kitchen aspirin 81 MG tablet Take 81 mg by mouth daily.     . B Complex Vitamins (VITAMIN B COMPLEX PO) Take 1 tablet by mouth daily.     . clonazePAM (KLONOPIN) 0.5 MG tablet Take 0.5 mg by mouth 2 (two) times daily.   0  . Coenzyme Q10 (COQ10 PO) Take 1 tablet by mouth daily.     . Lactobacillus (ACIDOPHILUS PO) Take 1 capsule by mouth daily.     . Multiple Vitamins-Minerals (CENTRUM CARDIO PO) Take 1 tablet by mouth daily.     . Omega-3 Fatty Acids (OMEGA 3 PO) Take 3,840 mg by mouth 2 (two) times daily. Nordic Natural ultimate Omega 3     No current facility-administered medications for this visit.     Allergies:   Penicillins    Social History:  The patient  reports that he quit smoking about 31 years ago. He has a 18.00 pack-year smoking history. He has never used smokeless tobacco. He reports that he drinks alcohol. He reports that he does not use drugs.   Family History:  The patient's family history includes  Cancer in his sister; Heart attack in his father; Heart disease in his father; Stroke in his father.    ROS:  Please see the history of present illness.   Otherwise, review of systems are positive for occasional foot pain, DOE.   All other systems are reviewed and negative.    PHYSICAL EXAM: VS:  BP 100/76   Pulse (!) 53   Ht 6' (1.829 m)   Wt 184 lb 12.8 oz (83.8 kg)   SpO2 (!) 88%   BMI 25.06 kg/m  , BMI Body mass index is 25.06 kg/m. GEN: Well nourished, well developed, in no acute distress  HEENT: normal  Neck: no JVD, carotid bruits, or masses Cardiac: RRR; no murmurs, rubs, or gallops,no edema  Respiratory:  clear to auscultation bilaterally, normal work of breathing GI: soft, nontender, nondistended, + BS MS: no deformity or atrophy  Skin: warm and dry, no rash Neuro:  Strength and sensation are intact Psych: euthymic mood, full affect   EKG:   The ekg ordered today demonstrates Sinus bradycardia, prolonged PR interval   Recent Labs: 02/03/2016: Brain Natriuretic Peptide 63.1; BUN 16; Creat 0.89; Potassium 4.5; Sodium 141 06/12/2016: ALT 20   Lipid Panel    Component Value Date/Time   CHOL 101 06/12/2016 0930   CHOL 117 02/06/2015 0853   TRIG 86 06/12/2016 0930   TRIG 120 02/06/2015 0853   HDL 37 (L) 06/12/2016 0930   HDL 35 (L) 02/06/2015 0853   CHOLHDL 2.7 06/12/2016 0930   LDLCALC 47 06/12/2016 0930   LDLCALC 58 02/06/2015 0853     Other studies Reviewed: Additional studies/ records that were reviewed today with results demonstrating: Nl LV function.  Increased PA pressures from 6/17 to 2/18.   ASSESSMENT AND PLAN:  1. CAD: No typical angina, but SHOB could be anginal equivalent.  Plan for left and right heart cath.  Risks and benefits explained.  All questions answered.  Continue aggressive secondary prevention.  2. Hyperlipidemia: Continue Crestor.  He had been of of it but no change in [facility].  Restart lipid lowering therapy. Particle number < 700 in  9/17. 3. Bradycardia: No sx of bradycardia.  No syncope.  4. Elevated BP in the past.  BP better with weight loss.  Continue current lifestyle modifications.  5. Sx concerning for OSA.  WIll discuss with  Dr. Lamonte Sakai sleep study as a possible case of pulmonary HTN.    Current medicines are reviewed at length with the patient today.  The patient concerns regarding his medicines were addressed.  The following changes have been made:  No change  Labs/ tests ordered today include: labs checked today for cath No orders of the defined types were placed in this encounter.   Recommend 150 minutes/week of aerobic exercise Low fat, low carb, high fiber diet recommended  Disposition:   FU in 1 year   Signed, Larae Grooms, MD  11/12/2016 10:20 AM    [facility] [place], [place], [place]  [address] Phone: [phone]; Fax: [phone]

## 2016-11-25 ENCOUNTER — Encounter: Payer: Self-pay | Admitting: Interventional Cardiology

## 2016-11-25 ENCOUNTER — Ambulatory Visit: Payer: Medicare Other | Admitting: Physician Assistant

## 2016-12-02 ENCOUNTER — Emergency Department (HOSPITAL_COMMUNITY)
Admission: EM | Admit: 2016-12-02 | Discharge: 2016-12-02 | Disposition: A | Discharge status: Home / Self Care | Payer: Medicare Other | Attending: Emergency Medicine | Admitting: Emergency Medicine

## 2016-12-02 ENCOUNTER — Telehealth: Payer: Self-pay | Admitting: Cardiology

## 2016-12-02 ENCOUNTER — Encounter (HOSPITAL_COMMUNITY): Payer: Self-pay

## 2016-12-02 ENCOUNTER — Emergency Department (HOSPITAL_COMMUNITY): Payer: Medicare Other

## 2016-12-02 DIAGNOSIS — F419 Anxiety disorder, unspecified: Secondary | ICD-10-CM | POA: Diagnosis not present

## 2016-12-02 DIAGNOSIS — Z955 Presence of coronary angioplasty implant and graft: Secondary | ICD-10-CM | POA: Insufficient documentation

## 2016-12-02 DIAGNOSIS — Z87891 Personal history of nicotine dependence: Secondary | ICD-10-CM | POA: Insufficient documentation

## 2016-12-02 DIAGNOSIS — Z7982 Long term (current) use of aspirin: Secondary | ICD-10-CM | POA: Insufficient documentation

## 2016-12-02 DIAGNOSIS — Z79899 Other long term (current) drug therapy: Secondary | ICD-10-CM | POA: Insufficient documentation

## 2016-12-02 DIAGNOSIS — I2510 Atherosclerotic heart disease of native coronary artery without angina pectoris: Secondary | ICD-10-CM | POA: Insufficient documentation

## 2016-12-02 DIAGNOSIS — R0789 Other chest pain: Secondary | ICD-10-CM | POA: Insufficient documentation

## 2016-12-02 HISTORY — DX: Pulmonary hypertension, unspecified: I27.20

## 2016-12-02 LAB — I-STAT CHEM 8, ED
BUN: 19 mg/dL (ref 6–20)
CALCIUM ION: 1.14 mmol/L — AB (ref 1.15–1.40)
CHLORIDE: 104 mmol/L (ref 101–111)
Creatinine, Ser: 1 mg/dL (ref 0.61–1.24)
Glucose, Bld: 97 mg/dL (ref 65–99)
HEMATOCRIT: 48 % (ref 39.0–52.0)
Hemoglobin: 16.3 g/dL (ref 13.0–17.0)
POTASSIUM: 4.2 mmol/L (ref 3.5–5.1)
SODIUM: 143 mmol/L (ref 135–145)
TCO2: 24 mmol/L (ref 0–100)

## 2016-12-02 LAB — I-STAT TROPONIN, ED
TROPONIN I, POC: 0.01 ng/mL (ref 0.00–0.08)
Troponin i, poc: 0.01 ng/mL (ref 0.00–0.08)

## 2016-12-02 LAB — CBC
HEMATOCRIT: 46.9 % (ref 39.0–52.0)
Hemoglobin: 16.2 g/dL (ref 13.0–17.0)
MCH: 32.1 pg (ref 26.0–34.0)
MCHC: 34.5 g/dL (ref 30.0–36.0)
MCV: 92.9 fL (ref 78.0–100.0)
PLATELETS: 130 10*3/uL — AB (ref 150–400)
RBC: 5.05 MIL/uL (ref 4.22–5.81)
RDW: 13.9 % (ref 11.5–15.5)
WBC: 9.5 10*3/uL (ref 4.0–10.5)

## 2016-12-02 MED ORDER — LORAZEPAM 1 MG PO TABS
1.0000 mg | ORAL_TABLET | Freq: Once | ORAL | Status: AC
  Administered 2016-12-02: 1 mg via SUBLINGUAL
  Filled 2016-12-02: qty 1

## 2016-12-02 NOTE — ED Provider Notes (Signed)
[facility] DEPT Provider Note   CSN: [id number] Arrival date & time: 12/02/16  0247     History   Chief Complaint Chief Complaint  Patient presents with  . Chest Pain    HPI [name] is a 71 y.o. male.  Patient presents to the emergency department with complaints of chest discomfort. He reports that symptoms began this evening. He had gone for a long walk without having any pain but once he got home he started having discomfort in his chest. He reports that he heard a loud noise upstairs and thought his wife fell, ran up the stairs and became very anxious. It turns out she had not fallen but since then he has been extremely anxious, not responding to his clonazepam.      Past Medical History:  Diagnosis Date  . CAD (coronary artery disease)   . GAD (generalized anxiety disorder)   . Hyperlipidemia   . Overweight(278.02)   . Pulmonary hypertension     Patient Active Problem List   Diagnosis Date Noted  . Dyspnea on exertion 09/24/2016  . Essential hypertension, benign 11/06/2013  . CAD (coronary artery disease)   . GAD (generalized anxiety disorder)   . Hyperlipidemia   . Overweight(278.02)     Past Surgical History:  Procedure Laterality Date  . ANAL FISSURE REPAIR    . CARDIAC CATHETERIZATION  2005  . RIGHT/LEFT HEART CATH AND CORONARY ANGIOGRAPHY N/A 11/24/2016   Procedure: Right/Left Heart Cath and Coronary Angiography;  Surgeon: Jettie Booze, MD;  Location: [facility];  Service: Cardiovascular;  Laterality: N/A;  . SKIN CANCER EXCISION    . TONSILLECTOMY  1955  . UMBILICAL HERNIA REPAIR  10/07/2011   Procedure: HERNIA REPAIR UMBILICAL ADULT;  Surgeon: Rolm Bookbinder, MD;  Location: [facility];  Service: General;  Laterality: N/A;  with mesh        Home Medications    Prior to Admission medications   Medication Sig Start Date End Date Taking? Authorizing Provider  aspirin 81 MG tablet Take 81 mg by mouth every evening.    Yes  Historical Provider, MD  clonazePAM (KLONOPIN) 0.5 MG tablet Take 0.5 mg by mouth 2 (two) times daily as needed for anxiety.  10/10/14  Yes Historical Provider, MD  Coenzyme Q10 (VITALINE COQ10) 60 MG TABS Take 60 mg by mouth daily.   Yes Historical Provider, MD  diphenhydrAMINE (BENADRYL) 12.5 MG/5ML liquid Take 25 mg by mouth at bedtime as needed for sleep.   Yes Historical Provider, MD  ezetimibe (ZETIA) 10 MG tablet Take 10 mg by mouth every evening.   Yes Historical Provider, MD  fexofenadine (ALLEGRA) 180 MG tablet Take 180 mg by mouth daily as needed for allergies.   Yes Historical Provider, MD  ibuprofen (ADVIL,MOTRIN) 200 MG tablet Take 400 mg by mouth 2 (two) times daily as needed for headache or moderate pain.   Yes Historical Provider, MD  Lactobacillus (ACIDOPHILUS PO) Take 1 capsule by mouth daily.    Yes Historical Provider, MD  Multiple Vitamins-Minerals (CENTRUM CARDIO PO) Take 1 tablet by mouth daily.    Yes Historical Provider, MD  Omega-3 Fatty Acids (OMEGA 3 PO) Take 3,200 mg by mouth 2 (two) times daily. Nordic Natural ultimate Omega 3    Yes Historical Provider, MD  rosuvastatin (CRESTOR) 40 MG tablet Take 1 tablet (40 mg total) by mouth daily. Patient taking differently: Take 40 mg by mouth every evening.  11/19/16 02/17/17 Yes Jettie Booze, MD  vitamin B-12 (CYANOCOBALAMIN) 1000 MCG tablet Take 1,000 mcg by mouth daily.   Yes Historical Provider, MD    Family History Family History  Problem Relation Age of Onset  . Cancer Sister     breast  . Heart disease Father   . Heart attack Father   . Stroke Father     Social History Social History  Substance Use Topics  . Smoking status: Former Smoker    Packs/day: 1.00    Years: 18.00    Quit date: 09/27/1985  . Smokeless tobacco: Never Used  . Alcohol use Yes     Allergies   Penicillins and Symbicort [budesonide-formoterol fumarate]   Review of Systems Review of Systems  Cardiovascular: Positive for  chest pain.  Psychiatric/Behavioral: The patient is nervous/anxious.   All other systems reviewed and are negative.    Physical Exam Updated Vital Signs BP 110/66 (BP Location: Left Arm)   Pulse 61   Temp 98.1 F (36.7 C) (Oral)   Resp 12   SpO2 95%   Physical Exam  Constitutional: He is oriented to person, place, and time. He appears well-developed and well-nourished. No distress.  HENT:  Head: Normocephalic and atraumatic.  Right Ear: Hearing normal.  Left Ear: Hearing normal.  Nose: Nose normal.  Mouth/Throat: Oropharynx is clear and moist and mucous membranes are normal.  Eyes: Conjunctivae and EOM are normal. Pupils are equal, round, and reactive to light.  Neck: Normal range of motion. Neck supple.  Cardiovascular: Regular rhythm, S1 normal and S2 normal.  Exam reveals no gallop and no friction rub.   No murmur heard. Pulmonary/Chest: Effort normal and breath sounds normal. No respiratory distress. He exhibits no tenderness.  Abdominal: Soft. Normal appearance and bowel sounds are normal. There is no hepatosplenomegaly. There is no tenderness. There is no rebound, no guarding, no tenderness at McBurney's point and negative Murphy's sign. No hernia.  Musculoskeletal: Normal range of motion.  Neurological: He is alert and oriented to person, place, and time. He has normal strength. No cranial nerve deficit or sensory deficit. Coordination normal. GCS eye subscore is 4. GCS verbal subscore is 5. GCS motor subscore is 6.  Skin: Skin is warm, dry and intact. No rash noted. No cyanosis.  Psychiatric: His speech is normal and behavior is normal. Thought content normal. His mood appears anxious (Patient very jittery).  Nursing note and vitals reviewed.    ED Treatments / Results  Labs (all labs ordered are listed, but only abnormal results are displayed) Labs Reviewed  CBC - Abnormal; Notable for the following:       Result Value   Platelets 130 (*)    All other components  within normal limits  I-STAT CHEM 8, ED - Abnormal; Notable for the following:    Calcium, Ion 1.14 (*)    All other components within normal limits  I-STAT TROPOININ, ED  I-STAT TROPOININ, ED    EKG  EKG Interpretation  Date/Time:  Wednesday December 02 2016 02:55:04 EDT Ventricular Rate:  64 PR Interval:    QRS Duration: 102 QT Interval:  392 QTC Calculation: 405 R Axis:   -81 Text Interpretation:  Sinus rhythm Prolonged PR interval Inferior infarct, old Anterior infarct, old Lateral leads are also involved Baseline wander in lead(s) V5 No previous tracing Confirmed by Betsey Holiday  MD, CHRISTOPHER [phone]) on 12/02/2016 4:03:13 AM       Radiology Dg Chest Port 1 View  Result Date: 12/02/2016 CLINICAL DATA:  Worsening chest pain and  dyspnea since late yesterday evening EXAM: PORTABLE CHEST 1 VIEW COMPARISON:  09/24/2016 FINDINGS: A single AP portable view of the chest demonstrates no focal airspace consolidation or alveolar edema. The lungs are grossly clear. There is no large effusion or pneumothorax. Cardiac and mediastinal contours appear unremarkable. IMPRESSION: No active disease. Electronically Signed   By: Andreas Newport M.D.   On: 12/02/2016 05:15    Procedures Procedures (including critical care time)  Medications Ordered in ED Medications  LORazepam (ATIVAN) tablet 1 mg (1 mg Sublingual Given 12/02/16 0448)     Initial Impression / Assessment and Plan / ED Course  I have reviewed the triage vital signs and the nursing notes.  Pertinent labs & imaging results that were available during my care of the patient were reviewed by me and considered in my medical decision making (see chart for details).     Presents to the emergency department with complaints of chest discomfort. Patient reports that he has been feeling extremely anxious, does have a history of generalized anxiety disorder and panic. He had cardiac catheterization last week that did not show any change in his  coronary arteries. He does have severe pulmonary hypertension. Patient's troponin is negative. EKG unchanged from previous. Patient was held in the ER, monitored. He was given Ativan with improvement of his symptoms. Second troponin negative, felt to be appropriate for discharge and outpatient follow-up.  Final Clinical Impressions(s) / ED Diagnoses   Final diagnoses:  Atypical chest pain  Anxiety    New Prescriptions New Prescriptions   No medications on file     Orpah Greek, MD 12/02/16 0800

## 2016-12-02 NOTE — ED Triage Notes (Signed)
Pt was just dx with pulmonary hypertension this week and tonight he started having centralized chest pain His wife states that he's been very upset tonight and crying a lot Pt states that he will see his cardiologist Monday and will have a plan at that time

## 2016-12-02 NOTE — Telephone Encounter (Signed)
Pt called stating he was not feeling well. Was seen in the [facility] last night/this morning for cough, congestion, and generalized fatigue with chest pain. Recent cath 11/24/16 showing CTO of the RCA with collaterals, no new findings. States tonight he has fever, and chills. No chest pain. Right radial site with bruising per his report but otherwise stable. Advised to call his PCP, but states he was only able to get an appt on 4/17. Instructed that Ibuprofen was ok to use for fever, and recommended to use the [facility] in Clinic tomorrow if unable to see his PCP. Suspicion for infection, possibly viral illness?, though CXR in the ED showed no acute findings, no edema. The patient was agreeable to this plan and expressed thanks for the follow up call.   Reino Bellis NP-C

## 2016-12-02 NOTE — ED Notes (Signed)
Bed: WTR7 Expected date:  Expected time:  Means of arrival:  Comments:

## 2016-12-06 NOTE — Progress Notes (Signed)
Patient ID: [name], male   DOB: December 23, 1945, 71 y.o.   MRN: [id number]     Cardiology Office Note   Date:  12/07/2016   ID:  [name], DOB 1946-04-09, MRN [id number]  PCP:  Melinda Crutch, MD    No chief complaint on file.  CAD  Wt Readings from Last 3 Encounters:  12/07/16 181 lb (82.1 kg)  11/24/16 181 lb (82.1 kg)  11/12/16 184 lb 12.8 oz (83.8 kg)       History of Present Illness: [name] is a 71 y.o. male  who  Has CAD with a known CTO of the RCA.  He used to use the elliptical and treadmill and coached HS golf at [employer].  He has had profound SHOB and has pulmonary HTN, severe, by cath.  CTO of RCA was unchanged in 3/18 at most recent cath. CAD/ASCVD:  Denies : Chest pain. Dizziness. Leg edema. Nitroglycerin. Orthopnea. Palpitations. Syncope.   In the past, he cut down on his carbs because his wife went gluten free.  He decreases portion size.  He is trying harder at the gym.    He is limited a little by foot pain.    He had pneumonia in 12/16 while he was travelling in [place]- he was not hospitalized.  His BP has been controlled for the last year.    His highest weight several years ago was 240 lbs.  He has continued to lose weight over the past year as well.    He reports DOE, which he Has had a low level for many years. Since his  community-acquired pneumonia in 2016, he feels that his shortness of breath is worse. It is worse with [place]  He feels that he has never bounced back from the PNA.  Echo and BNP in 2017 were ok.  Pulmonary w/u and autoimmune w/u were not revealing.  He tried stopping statins for a while, but no change in sx.  He has taken Symbicort but developed a cough.     He made a trip to the ER after having chest pain with anxiety in early 4/18.  He was discharged the same night after a negative workup.  He is still very concerned about the severe pulmonary HTN.  We discussed further w/u.  He was  considering going to [facility] or [facility].  We spoke about referral to Dr. Haroldine Laws.       Past Medical History:  Diagnosis Date  . CAD (coronary artery disease)   . GAD (generalized anxiety disorder)   . Hyperlipidemia   . Overweight(278.02)   . Pulmonary hypertension     Past Surgical History:  Procedure Laterality Date  . ANAL FISSURE REPAIR    . CARDIAC CATHETERIZATION  2005  . RIGHT/LEFT HEART CATH AND CORONARY ANGIOGRAPHY N/A 11/24/2016   Procedure: Right/Left Heart Cath and Coronary Angiography;  Surgeon: Jettie Booze, MD;  Location: [facility];  Service: Cardiovascular;  Laterality: N/A;  . SKIN CANCER EXCISION    . TONSILLECTOMY  1955  . UMBILICAL HERNIA REPAIR  10/07/2011   Procedure: HERNIA REPAIR UMBILICAL ADULT;  Surgeon: Rolm Bookbinder, MD;  Location: [facility];  Service: General;  Laterality: N/A;  with mesh      Current Outpatient Prescriptions  Medication Sig Dispense Refill  . aspirin 81 MG tablet Take 81 mg by mouth every evening.     . clonazePAM (KLONOPIN) 0.5 MG tablet Take  0.5 mg by mouth 2 (two) times daily as needed for anxiety.   0  . Coenzyme Q10 (VITALINE COQ10) 60 MG TABS Take 60 mg by mouth daily.    . diphenhydrAMINE (BENADRYL) 12.5 MG/5ML liquid Take 25 mg by mouth at bedtime as needed for sleep.    Marland Kitchen ezetimibe (ZETIA) 10 MG tablet Take 10 mg by mouth every evening.    . fexofenadine (ALLEGRA) 180 MG tablet Take 180 mg by mouth daily as needed for allergies.    Marland Kitchen HYDROMET 5-1.5 MG/5ML syrup   0  . ibuprofen (ADVIL,MOTRIN) 200 MG tablet Take 400 mg by mouth 2 (two) times daily as needed for headache or moderate pain.    Marland Kitchen ipratropium (ATROVENT) 0.06 % nasal spray INSTILL 2 SPRAYS IN NOSTRIL QID  0  . Lactobacillus (ACIDOPHILUS PO) Take 1 capsule by mouth daily.     . Multiple Vitamins-Minerals (CENTRUM CARDIO PO) Take 1 tablet by mouth daily.     . Omega-3 Fatty Acids (OMEGA 3 PO) Take 3,200 mg by mouth 2 (two) times daily. Nordic  Natural ultimate Omega 3     . rosuvastatin (CRESTOR) 40 MG tablet Take 1 tablet (40 mg total) by mouth daily. (Patient taking differently: Take 40 mg by mouth every evening. ) 90 tablet 3  . vitamin B-12 (CYANOCOBALAMIN) 1000 MCG tablet Take 1,000 mcg by mouth daily.    Marland Kitchen amLODipine (NORVASC) 2.5 MG tablet TK 1 T PO QD  0   No current facility-administered medications for this visit.     Allergies:   Penicillins and Symbicort [budesonide-formoterol fumarate]    Social History:  The patient  reports that he quit smoking about 31 years ago. He has a 18.00 pack-year smoking history. He has never used smokeless tobacco. He reports that he drinks alcohol. He reports that he does not use drugs.   Family History:  The patient's family history includes Cancer in his sister; Heart attack in his father; Heart disease in his father; Stroke in his father.    ROS:  Please see the history of present illness.   Otherwise, review of systems are positive for occasional foot pain, DOE.   All other systems are reviewed and negative.    PHYSICAL EXAM: VS:  BP 122/80   Pulse 62   Resp 16   Ht 6' (1.829 m)   Wt 181 lb (82.1 kg)   BMI 24.55 kg/m  , BMI Body mass index is 24.55 kg/m. GEN: Well nourished, well developed, in no acute distress  HEENT: normal  Neck: no JVD, carotid bruits, or masses Cardiac: RRR; no murmurs, rubs, or gallops,no edema  Respiratory:  clear to auscultation bilaterally, normal work of breathing GI: soft, nontender, nondistended, + BS MS: no deformity or atrophy ; mild right forearm bruising; no hematoma. No hematoma at the elbow Skin: warm and dry, no rash Neuro:  Strength and sensation are intact Psych: euthymic mood, full affect   EKG:   The ekg ordered today demonstrates Sinus bradycardia, prolonged PR interval   Recent Labs: 02/03/2016: Brain Natriuretic Peptide 63.1 06/12/2016: ALT 20 12/02/2016: BUN 19; Creatinine, Ser 1.00; Hemoglobin 16.3; Platelets 130;  Potassium 4.2; Sodium 143   Lipid Panel    Component Value Date/Time   CHOL 101 06/12/2016 0930   CHOL 117 02/06/2015 0853   TRIG 86 06/12/2016 0930   TRIG 120 02/06/2015 0853   HDL 37 (L) 06/12/2016 0930   HDL 35 (L) 02/06/2015 0853   CHOLHDL 2.7 06/12/2016  0930   LDLCALC 47 06/12/2016 0930   LDLCALC 58 02/06/2015 0853     Other studies Reviewed: Additional studies/ records that were reviewed today with results demonstrating: Nl LV function.  Increased PA pressures from 6/17 to 2/18.   ASSESSMENT AND PLAN:  1. CAD: No typical angina.  SHOB is likely related to severe pulmonary HTN.  s/p left and right heart cath. Continue aggressive secondary prevention. He will see Dr. Haroldine Laws.  Referral placed.   2. Hyperlipidemia: He had been off of Crestor but no change in [facility].  Restarted Crestor at last visit. Particle number < 700 in 9/17. 3. Bradycardia: No sx of bradycardia.  No syncope.  4. Elevated BP in the past.  BP better with weight loss.  Continue current lifestyle modifications. He was started on amlodipine 2.5 mg for his Raynauds. 5. Sx concerning for OSA.  Sleep study will likely be part of his w/u for pulmonary HTN.     Current medicines are reviewed at length with the patient today.  The patient concerns regarding his medicines were addressed.  The following changes have been made:  No change  Labs/ tests ordered today include: labs checked today for cath No orders of the defined types were placed in this encounter.   Recommend 150 minutes/week of aerobic exercise Low fat, low carb, high fiber diet recommended  Disposition:   FU in 1 year   Signed, Larae Grooms, MD  12/07/2016 3:53 PM    [facility] [place], [place], [place]  [address] Phone: [phone]; Fax: [phone]

## 2016-12-07 ENCOUNTER — Encounter: Payer: Self-pay | Admitting: Interventional Cardiology

## 2016-12-07 ENCOUNTER — Ambulatory Visit (INDEPENDENT_AMBULATORY_CARE_PROVIDER_SITE_OTHER): Payer: Medicare Other | Admitting: Interventional Cardiology

## 2016-12-07 VITALS — BP 122/80 | HR 62 | Resp 16 | Ht 72.0 in | Wt 181.0 lb

## 2016-12-07 DIAGNOSIS — J301 Allergic rhinitis due to pollen: Secondary | ICD-10-CM | POA: Insufficient documentation

## 2016-12-07 DIAGNOSIS — I25118 Atherosclerotic heart disease of native coronary artery with other forms of angina pectoris: Secondary | ICD-10-CM | POA: Diagnosis not present

## 2016-12-07 DIAGNOSIS — E669 Obesity, unspecified: Secondary | ICD-10-CM | POA: Insufficient documentation

## 2016-12-07 DIAGNOSIS — I7300 Raynaud's syndrome without gangrene: Secondary | ICD-10-CM | POA: Insufficient documentation

## 2016-12-07 DIAGNOSIS — G4700 Insomnia, unspecified: Secondary | ICD-10-CM | POA: Insufficient documentation

## 2016-12-07 DIAGNOSIS — F430 Acute stress reaction: Secondary | ICD-10-CM | POA: Insufficient documentation

## 2016-12-07 DIAGNOSIS — M545 Low back pain, unspecified: Secondary | ICD-10-CM | POA: Insufficient documentation

## 2016-12-07 DIAGNOSIS — F419 Anxiety disorder, unspecified: Secondary | ICD-10-CM | POA: Insufficient documentation

## 2016-12-07 DIAGNOSIS — I499 Cardiac arrhythmia, unspecified: Secondary | ICD-10-CM | POA: Insufficient documentation

## 2016-12-07 DIAGNOSIS — L219 Seborrheic dermatitis, unspecified: Secondary | ICD-10-CM | POA: Insufficient documentation

## 2016-12-07 DIAGNOSIS — R0609 Other forms of dyspnea: Secondary | ICD-10-CM | POA: Diagnosis not present

## 2016-12-07 DIAGNOSIS — K429 Umbilical hernia without obstruction or gangrene: Secondary | ICD-10-CM | POA: Insufficient documentation

## 2016-12-07 DIAGNOSIS — I209 Angina pectoris, unspecified: Secondary | ICD-10-CM | POA: Insufficient documentation

## 2016-12-07 NOTE — Patient Instructions (Signed)
Medication Instructions:  Your physician recommends that you continue on your current medications as directed. Please refer to the Current Medication list given to you today.   Labwork: None  Testing/Procedures: None  Follow-Up: You have been referred to Dr. Haroldine Laws in the [facility] to manage your pulmonary hypertension. Dr. Clayborne Dana office will call you to schedule an appointment.   Your physician recommends that you schedule a follow-up appointment AS NEEDED with Dr. Irish Lack.  Any Other Special Instructions Will Be Listed Below (If Applicable).     If you need a refill on your cardiac medications before your next appointment, please call your pharmacy.

## 2016-12-17 ENCOUNTER — Encounter (HOSPITAL_COMMUNITY): Payer: Self-pay | Admitting: Internal Medicine

## 2016-12-17 ENCOUNTER — Ambulatory Visit (HOSPITAL_COMMUNITY)
Admission: RE | Admit: 2016-12-17 | Discharge: 2016-12-17 | Disposition: A | Discharge status: Home / Self Care | Payer: Medicare Other | Source: Ambulatory Visit | Attending: Internal Medicine | Admitting: Internal Medicine

## 2016-12-17 VITALS — BP 118/72 | HR 64 | Wt 180.2 lb

## 2016-12-17 DIAGNOSIS — Z880 Allergy status to penicillin: Secondary | ICD-10-CM | POA: Insufficient documentation

## 2016-12-17 DIAGNOSIS — Z7982 Long term (current) use of aspirin: Secondary | ICD-10-CM | POA: Diagnosis not present

## 2016-12-17 DIAGNOSIS — I7300 Raynaud's syndrome without gangrene: Secondary | ICD-10-CM | POA: Diagnosis not present

## 2016-12-17 DIAGNOSIS — Z79899 Other long term (current) drug therapy: Secondary | ICD-10-CM | POA: Insufficient documentation

## 2016-12-17 DIAGNOSIS — E785 Hyperlipidemia, unspecified: Secondary | ICD-10-CM | POA: Insufficient documentation

## 2016-12-17 DIAGNOSIS — Z888 Allergy status to other drugs, medicaments and biological substances status: Secondary | ICD-10-CM | POA: Diagnosis not present

## 2016-12-17 DIAGNOSIS — Z823 Family history of stroke: Secondary | ICD-10-CM | POA: Insufficient documentation

## 2016-12-17 DIAGNOSIS — Z87891 Personal history of nicotine dependence: Secondary | ICD-10-CM | POA: Diagnosis not present

## 2016-12-17 DIAGNOSIS — I2510 Atherosclerotic heart disease of native coronary artery without angina pectoris: Secondary | ICD-10-CM | POA: Insufficient documentation

## 2016-12-17 DIAGNOSIS — R0609 Other forms of dyspnea: Secondary | ICD-10-CM

## 2016-12-17 DIAGNOSIS — J449 Chronic obstructive pulmonary disease, unspecified: Secondary | ICD-10-CM | POA: Diagnosis not present

## 2016-12-17 DIAGNOSIS — F411 Generalized anxiety disorder: Secondary | ICD-10-CM | POA: Diagnosis not present

## 2016-12-17 DIAGNOSIS — I272 Pulmonary hypertension, unspecified: Secondary | ICD-10-CM | POA: Insufficient documentation

## 2016-12-17 DIAGNOSIS — Z8249 Family history of ischemic heart disease and other diseases of the circulatory system: Secondary | ICD-10-CM | POA: Diagnosis not present

## 2016-12-17 NOTE — Progress Notes (Signed)
Patient ID: DEE MADAY, male   DOB: 08/31/1946, 71 y.o.   MRN: [id number]     [facility]INIC CONSULT NOTE   Date:  12/17/2016   ID:  [place], DOB 09/10/1945, MRN [id number]  PCP:  Melinda Crutch, MD   REFERRING: Irish Lack   History of Present Illness: Bates Collington Mac is a 71 y.o. male with a h/o morbid obesity (s/p extensive weight loss), previous smoker quit in the 70s)m Raynaud's disease and CAD with a known CTO of the RCA (Cath 2005) who is referred by Dr. Irish Lack for further evaluation of pulmonary HTN.   Says he was doing fairly well up until about a year and a half ago when he developed PNA in 9/16. Since that time has noted he has progressive DOE and has noticed a significant decrease in his functional capacity. 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 Says dyspnea is worse with walking [place] hills on the golf [place]. He saw Dr. Lamonte Sakai and PFTs showed mild to moderate obstruction with +BD response and markedly reduced DLCO (29%). He was started on Symbicort and right heart cath was suggested.  Autoimmune labs were done, shows ANA positive at 1:40, a-scl-70 negative. RF negative. With markedly reduced DLCO. Underwent R/L cath with stable CAD and moderate to severe PAH. (see below)  Currently gets winded after 1/4 mile. No dizziness. No orthopnea or PND. No previous diagnosis of CTD. Does have Raynaud's. Has never seen a Rhuematologist  Father Also had Raynaud's but no FHx PAH or HF. Denies h/o DVT or PE.   Had cath 3/18:  Mild nonobstructive CAD on left. CTO RCA with left to right and right to right colatterals (unchanged since 2005) RA 4 RV 78/4 PA 79/23 (43) PCW 4 Fick 5.2/2.5 Mild response to BDs  PFTs FEV1  2.61 (75%) FVC    4.15 (87%) DLCO 24%  Echo 2/18: EF 65-70% Grade I DD RV mildly dilated with normal function. No septal flatteningreviewed personally)  CXR 12/02/16: Normal (reviewed personally)   Past Medical  History:  Diagnosis Date  . CAD (coronary artery disease)   . GAD (generalized anxiety disorder)   . Hyperlipidemia   . Overweight(278.02)   . Pulmonary hypertension ([place])     Past Surgical History:  Procedure Laterality Date  . ANAL FISSURE REPAIR    . CARDIAC CATHETERIZATION  2005  . RIGHT/LEFT HEART CATH AND CORONARY ANGIOGRAPHY N/A 11/24/2016   Procedure: Right/Left Heart Cath and Coronary Angiography;  Surgeon: Jettie Booze, MD;  Location: [facility];  Service: Cardiovascular;  Laterality: N/A;  . SKIN CANCER EXCISION    . TONSILLECTOMY  1955  . UMBILICAL HERNIA REPAIR  10/07/2011   Procedure: HERNIA REPAIR UMBILICAL ADULT;  Surgeon: Rolm Bookbinder, MD;  Location: [facility];  Service: General;  Laterality: N/A;  with mesh      Current Outpatient Prescriptions  Medication Sig Dispense Refill  . aspirin 81 MG tablet Take 81 mg by mouth every evening.     . clonazePAM (KLONOPIN) 0.5 MG tablet Take 0.5 mg by mouth 2 (two) times daily as needed for anxiety.   0  . Coenzyme Q10 (VITALINE COQ10) 60 MG TABS Take 60 mg by mouth daily.    . diphenhydrAMINE (BENADRYL) 12.5 MG/5ML liquid Take 25 mg by mouth at bedtime as needed for sleep.    Marland Kitchen ezetimibe (ZETIA) 10 MG tablet Take 10 mg by mouth  every evening.    . fexofenadine (ALLEGRA) 180 MG tablet Take 180 mg by mouth daily as needed for allergies.    Marland Kitchen ibuprofen (ADVIL,MOTRIN) 200 MG tablet Take 400 mg by mouth 2 (two) times daily as needed for headache or moderate pain.    . Lactobacillus (ACIDOPHILUS PO) Take 1 capsule by mouth daily.     . Multiple Vitamins-Minerals (CENTRUM CARDIO PO) Take 1 tablet by mouth daily.     . Omega-3 Fatty Acids (OMEGA 3 PO) Take 3,200 mg by mouth 2 (two) times daily. Nordic Natural ultimate Omega 3     . rosuvastatin (CRESTOR) 40 MG tablet Take 1 tablet (40 mg total) by mouth daily. 90 tablet 3  . vitamin B-12 (CYANOCOBALAMIN) 1000 MCG tablet Take 1,000 mcg by mouth daily.    Marland Kitchen ipratropium  (ATROVENT) 0.06 % nasal spray INSTILL 2 SPRAYS IN NOSTRIL QID  0   No current facility-administered medications for this encounter.     Allergies:   Penicillins and Symbicort [budesonide-formoterol fumarate]    Social History:  The patient  reports that he quit smoking about 31 years ago. He has a 18.00 pack-year smoking history. He has never used smokeless tobacco. He reports that he drinks alcohol. He reports that he does not use drugs. He is married.   Family History:  The patient's family history includes Cancer in his sister; Heart attack in his father; Heart disease in his father; Stroke in his father.   Review of Systems: [y] = yes, _0  = no    General: Weight gain _1 ; Weight loss Blue.Reese ]; Anorexia _2 ; Fatigue [y]; Fever _3 ; Chills _4 ; Weakness Blue.Reese ]   Cardiac: Chest pain/pressure _5 ; Resting SOB _6 ; Exertional SOB [y]; Orthopnea _7 ; Pedal Edema _8 ; Palpitations _9 ; Syncope _10 ; Presyncope _11 ; Paroxysmal nocturnal dyspnea_12    Pulmonary: Cough _13 ; Wheezing_14 ; Hemoptysis_15 ; Sputum _16 ; Snoring _17    GI: Vomiting_18 ; Dysphagia_19 ; Melena_20 ; Hematochezia _21 ; Heartburn_22 ; Abdominal pain _23 ; Constipation _24 ; Diarrhea _25 ; BRBPR _26    GU: Hematuria_27 ; Dysuria _28 ; Nocturia_29   Vascular: Pain in legs with walking _30 ; Pain in feet with lying flat _31 ; Non-healing sores _32 ; Stroke _33 ; TIA _34 ; Slurred speech _35 ;   Neuro: Headaches_36 ; Vertigo_37 ; Seizures_38 ; Paresthesias_39 ;Blurred vision _40 ; Diplopia _41 ; Vision changes _42    Ortho/Skin: Arthritis [y]; Joint pain [y]; Muscle pain _43 ; Joint swelling _44 ; Back Pain _45 ; Rash _46    Psych: Depression_47 ; Anxiety_48    Heme: Bleeding problems _49 ; Clotting disorders _50 ; Anemia _51    Endocrine: Diabetes _52 ; Thyroid dysfunction_53    PHYSICAL EXAM: VS:  BP 118/72   Pulse 64   Wt 180 lb 4 oz (81.8 kg)   SpO2 91%   BMI 24.45 kg/m  , BMI Body mass index is 24.45 kg/m. General:  Well appearing. No  resp difficulty HEENT: normal Neck: supple. JVP 6 . Carotids 2+ bilat; no bruits. No lymphadenopathy or thryomegaly appreciated. Cor: PMI nondisplaced. Regular rate & rhythm. 2/6 TR. P@ mildly accentuated  Lungs: clear Abdomen: soft, nontender, nondistended. No hepatosplenomegaly. No bruits or masses. Good bowel sounds. Extremities: no cyanosis, clubbing, rash, edema Neuro: alert & orientedx3, cranial nerves grossly intact. moves all 4 extremities w/o difficulty. Affect pleasant    Recent Labs:  02/03/2016: Brain Natriuretic Peptide 63.1 06/12/2016: ALT 20 12/02/2016: BUN 19; Creatinine, Ser 1.00; Hemoglobin 16.3; Platelets 130; Potassium 4.2; Sodium 143   Lipid Panel    Component Value Date/Time   CHOL 101 06/12/2016 0930   CHOL 117 02/06/2015 0853   TRIG 86 06/12/2016 0930   TRIG 120 02/06/2015 0853   HDL 37 (L) 06/12/2016 0930   HDL 35 (L) 02/06/2015 0853   CHOLHDL 2.7 06/12/2016 0930   LDLCALC 47 06/12/2016 0930   LDLCALC 58 02/06/2015 0853      ASSESSMENT AND PLAN:  1. PAH 2. CAD   --known CTO of RCA dating back to 2005 3. h/o obesity s/p weight loss 4. COPD   --mild to moderate 5. Raynaud's disease  He has moderate PAH with markedly depressed DLCO which is far out of proportion to his parenchymal lung disease or left-sided heart disease. Suspect this is WHO Group I disease and given Raynaud's I am concerned there may be an underlying CTD though serology not overwhelming at this point.. Will refer to Dr. Amil Amen in Rheumatology. Will also get high-res CT to evaluate for ILD and VQ to exclude chronic PE. Start macitentan 7m daily.   Long discussion about pathophysiology, treatment objection and natural history of PAH with him and his wife.  Total time spent 50 minutes. Over half that time spent discussing above.   BGlori Bickers MD  6:51 PM

## 2016-12-17 NOTE — Patient Instructions (Signed)
Start Opsumit 10 mg daily  High Resolution CT scan of chest  VQ scan and chest x-ray  You have been referred to Pulmonary Rehab, they will call you schedule  You have been referred to Dr Amil Amen  Your physician recommends that you schedule a follow-up appointment in: 4-6 weeks

## 2016-12-21 ENCOUNTER — Telehealth (HOSPITAL_COMMUNITY): Payer: Self-pay | Admitting: Internal Medicine

## 2016-12-21 NOTE — Telephone Encounter (Signed)
Referral form & records faxed to Dr. Amil Amen _0  Rheumatology for appt (fax confirmation rec'd).  Pt aware that Dr. Melissa Noon office will contact him to schedule the appt.

## 2016-12-23 ENCOUNTER — Other Ambulatory Visit (HOSPITAL_COMMUNITY): Payer: Self-pay | Admitting: Pharmacist

## 2016-12-23 ENCOUNTER — Telehealth (HOSPITAL_COMMUNITY): Payer: Self-pay | Admitting: Pharmacist

## 2016-12-23 MED ORDER — MACITENTAN 10 MG PO TABS: 10.0000 mg | ORAL_TABLET | Freq: Every day | ORAL | 11 refills | Status: DC

## 2016-12-23 NOTE — Telephone Encounter (Signed)
Opsumit PA approved by [facility] part D through 06/24/17.   Ruta Hinds. Velva Harman, PharmD, BCPS, CPP Clinical Pharmacist Pager: [phone] Phone: [phone] 12/23/2016 12:33 PM

## 2016-12-24 ENCOUNTER — Ambulatory Visit: Payer: Medicare Other | Admitting: Emergency Medicine

## 2016-12-24 ENCOUNTER — Telehealth (HOSPITAL_COMMUNITY): Payer: Self-pay | Admitting: *Deleted

## 2016-12-24 ENCOUNTER — Ambulatory Visit (HOSPITAL_COMMUNITY)
Admission: RE | Admit: 2016-12-24 | Discharge: 2016-12-24 | Disposition: A | Discharge status: Home / Self Care | Payer: Medicare Other | Source: Ambulatory Visit | Attending: Internal Medicine | Admitting: Internal Medicine

## 2016-12-24 ENCOUNTER — Encounter (HOSPITAL_COMMUNITY)
Admission: RE | Admit: 2016-12-24 | Discharge: 2016-12-24 | Disposition: A | Discharge status: Home / Self Care | Payer: Medicare Other | Source: Ambulatory Visit | Attending: Internal Medicine | Admitting: Internal Medicine

## 2016-12-24 DIAGNOSIS — I700 Atherosclerosis of aorta: Secondary | ICD-10-CM | POA: Diagnosis not present

## 2016-12-24 DIAGNOSIS — I2510 Atherosclerotic heart disease of native coronary artery without angina pectoris: Secondary | ICD-10-CM | POA: Insufficient documentation

## 2016-12-24 DIAGNOSIS — I272 Pulmonary hypertension, unspecified: Secondary | ICD-10-CM | POA: Insufficient documentation

## 2016-12-24 DIAGNOSIS — J432 Centrilobular emphysema: Secondary | ICD-10-CM | POA: Diagnosis not present

## 2016-12-24 DIAGNOSIS — R918 Other nonspecific abnormal finding of lung field: Secondary | ICD-10-CM | POA: Diagnosis not present

## 2016-12-24 MED ORDER — TECHNETIUM TC 99M DIETHYLENETRIAME-PENTAACETIC ACID
31.7000 | Freq: Once | INTRAVENOUS | Status: AC
  Administered 2016-12-24: 31.7 via INTRAVENOUS

## 2016-12-24 MED ORDER — TECHNETIUM TO 99M ALBUMIN AGGREGATED
4.0000 | Freq: Once | INTRAVENOUS | Status: AC
  Administered 2016-12-24: 4 via INTRAVENOUS

## 2016-12-24 NOTE — Telephone Encounter (Signed)
Patient called letting us know that his Opsumit medication had come in the mail today and he would start taking it now.  He also asked how much his co-payment for future refills will cost him.  I advised him to call his insurance company and check what his co-payment is for the [place].  I also told him to call us back if he did have any problems with it.  He understands and no further questions at this time.

## 2017-01-19 ENCOUNTER — Other Ambulatory Visit (HOSPITAL_COMMUNITY): Payer: Self-pay | Admitting: Pharmacist

## 2017-01-19 MED ORDER — MACITENTAN 10 MG PO TABS: 10.0000 mg | ORAL_TABLET | Freq: Every day | ORAL | 11 refills | Status: DC

## 2017-01-20 ENCOUNTER — Encounter: Payer: Self-pay | Admitting: *Deleted

## 2017-01-20 DIAGNOSIS — Z006 Encounter for examination for normal comparison and control in clinical research program: Secondary | ICD-10-CM

## 2017-01-20 NOTE — Progress Notes (Signed)
Subject Name: [name]  Subject met inclusion and exclusion criteria. The informed consent form, study requirements and expectations were reviewed with the subject and questions and concerns were addressed prior to the signing of the consent form. The subject verbalized understanding of the trial requirements. The subject agreed to participate in the [place] and signed the informed consent. The informed consent was obtained prior to performance of any protocol-specific procedures for the subject. A copy of the signed informed consent was given to the subject and a copy was placed in the subject's medical record.  [name] 01/20/2017 15:07

## 2017-01-21 ENCOUNTER — Other Ambulatory Visit (HOSPITAL_COMMUNITY): Payer: Self-pay | Admitting: Cardiology

## 2017-01-21 MED ORDER — MACITENTAN 10 MG PO TABS: 10.0000 mg | ORAL_TABLET | Freq: Every day | ORAL | 11 refills | Status: AC

## 2017-01-24 ENCOUNTER — Encounter (HOSPITAL_COMMUNITY): Payer: Self-pay | Admitting: Internal Medicine

## 2017-01-29 ENCOUNTER — Encounter (HOSPITAL_COMMUNITY): Payer: Medicare Other | Admitting: Internal Medicine

## 2017-01-29 ENCOUNTER — Encounter (HOSPITAL_COMMUNITY): Payer: Self-pay | Admitting: Internal Medicine

## 2017-01-29 ENCOUNTER — Ambulatory Visit (HOSPITAL_COMMUNITY)
Admission: RE | Admit: 2017-01-29 | Discharge: 2017-01-29 | Disposition: A | Discharge status: Home / Self Care | Payer: Medicare Other | Source: Ambulatory Visit | Attending: Internal Medicine | Admitting: Internal Medicine

## 2017-01-29 VITALS — BP 110/70 | HR 68 | Wt 171.8 lb

## 2017-01-29 DIAGNOSIS — R0902 Hypoxemia: Secondary | ICD-10-CM | POA: Diagnosis not present

## 2017-01-29 DIAGNOSIS — Z8249 Family history of ischemic heart disease and other diseases of the circulatory system: Secondary | ICD-10-CM | POA: Insufficient documentation

## 2017-01-29 DIAGNOSIS — Z7982 Long term (current) use of aspirin: Secondary | ICD-10-CM | POA: Insufficient documentation

## 2017-01-29 DIAGNOSIS — J449 Chronic obstructive pulmonary disease, unspecified: Secondary | ICD-10-CM | POA: Diagnosis not present

## 2017-01-29 DIAGNOSIS — Z823 Family history of stroke: Secondary | ICD-10-CM | POA: Diagnosis not present

## 2017-01-29 DIAGNOSIS — Z79899 Other long term (current) drug therapy: Secondary | ICD-10-CM | POA: Insufficient documentation

## 2017-01-29 DIAGNOSIS — I272 Pulmonary hypertension, unspecified: Secondary | ICD-10-CM | POA: Insufficient documentation

## 2017-01-29 DIAGNOSIS — F411 Generalized anxiety disorder: Secondary | ICD-10-CM | POA: Diagnosis not present

## 2017-01-29 DIAGNOSIS — I2510 Atherosclerotic heart disease of native coronary artery without angina pectoris: Secondary | ICD-10-CM | POA: Diagnosis not present

## 2017-01-29 DIAGNOSIS — I7300 Raynaud's syndrome without gangrene: Secondary | ICD-10-CM | POA: Insufficient documentation

## 2017-01-29 DIAGNOSIS — Z87891 Personal history of nicotine dependence: Secondary | ICD-10-CM | POA: Diagnosis not present

## 2017-01-29 DIAGNOSIS — E785 Hyperlipidemia, unspecified: Secondary | ICD-10-CM | POA: Diagnosis not present

## 2017-01-29 DIAGNOSIS — J432 Centrilobular emphysema: Secondary | ICD-10-CM | POA: Diagnosis not present

## 2017-01-29 NOTE — Patient Instructions (Signed)
Overnight Oximetry test, [facility] will contact you about setting this up  You have been referred to [facility] for home oxygen, they will contact you for this  You have been referred to Pulmonary Rehab, they will contact you to schedule  Your physician recommends that you schedule a follow-up appointment in: 2 months

## 2017-01-29 NOTE — Progress Notes (Signed)
SATURATION QUALIFICATIONS: (This note is used to comply with regulatory documentation for home oxygen)  Patient Saturations on Room Air at Rest = 90%  Patient Saturations on Room Air while Ambulating = 85%  Patient Saturations on 2 Liters of oxygen while Ambulating = 94%  Please briefly explain why patient needs home oxygen: hypoxia, pulmonary hypertension

## 2017-01-29 NOTE — Progress Notes (Signed)
Patient ID: [name], male   DOB: Jan 12, 1946, 71 y.o.   MRN: [id number]     [facility]VANCED HF CLINIC CONSULT NOTE   Date:  01/29/2017   ID:  [name], DOB [birth year], MRN [id number]  PCP:  Aaron Cruel, MD   REFERRING: Irish Lack   History of Present Illness: [name] is a 71 y.o. male with a h/o morbid obesity (s/p extensive weight loss), previous smoker quit in the 52s)m Raynaud's disease and CAD with a known CTO of the RCA (Cath 2005) who is referred by Dr. Irish Lack for further evaluation of pulmonary HTN.   Says he was doing fairly well up until about a year and a half ago when he developed PNA in 9/16. Since that time has noted he has progressive DOE and has noticed a significant decrease in his functional capacity. 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 Says dyspnea is worse with walking [place] on the golf [place]. He saw Dr. Lamonte Sakai and PFTs showed mild to moderate obstruction with +BD response and markedly reduced DLCO (29%). He was started on Symbicort and right heart cath was suggested.  Autoimmune labs were done, shows ANA positive at 1:40, a-scl-70 negative. RF negative. With markedly reduced DLCO. Underwent R/L cath with stable CAD and moderate to severe PAH. (see below)  At last visit felt to have Group I PAH (with PH out of proportion to COPD). VQ negative for PE. Hi res CT with moderate to severe COPD despite relatively normal spirometry on PFTs. Has been on macitentan for 35-40 days. Feels a bit better. But having good days and days. Was able to play 9 holes of golf. Goes out for walks and gets SOB at half a mile. Gets dizzy if bends over. No syncope or presyncope.   Studies:  Had cath 3/18:  Mild nonobstructive CAD on left. CTO RCA with left to right and right to right colatterals (unchanged since 2005) RA 4 RV 78/4 PA 79/23 (43) PCW 4 Fick 5.2/2.5 Mild response to BDs  PFTs 1/18 FEV1  2.61 (75%) FVC     4.15 (87%) DLCO 24%  VQ: 4/18  Diminished perfusion in the RIGHT upper lobe corresponding to emphysematous changes on CT.  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  Echo 2/18: EF 65-70% Grade I DD RV mildly dilated with normal function. No septal flattening (reviewed personally)  CXR 12/02/16: Normal (reviewed personally)   Past Medical History:  Diagnosis Date  . CAD (coronary artery disease)   . GAD (generalized anxiety disorder)   . Hyperlipidemia   . Overweight(278.02)   . Pulmonary hypertension ([place])     Past Surgical History:  Procedure Laterality Date  . ANAL FISSURE REPAIR    . CARDIAC CATHETERIZATION  2005  . RIGHT/LEFT HEART CATH AND CORONARY ANGIOGRAPHY N/A 11/24/2016   Procedure: Right/Left Heart Cath and Coronary Angiography;  Surgeon: Jettie Booze, MD;  Location: [facility];  Service: Cardiovascular;  Laterality: N/A;  . SKIN CANCER EXCISION    . TONSILLECTOMY  1955  . UMBILICAL HERNIA REPAIR  10/07/2011   Procedure: HERNIA REPAIR UMBILICAL ADULT;  Surgeon: Rolm Bookbinder, MD;  Location: [facility];  Service: General;  Laterality: N/A;  with mesh      Current Outpatient Prescriptions  Medication Sig Dispense Refill  . aspirin 81 MG tablet Take 81 mg by  mouth every evening.     . clonazePAM (KLONOPIN) 0.5 MG tablet Take 0.5 mg by mouth 2 (two) times daily as needed for anxiety.   0  . Coenzyme Q10 (VITALINE COQ10) 60 MG TABS Take 60 mg by mouth daily.    . diphenhydrAMINE (BENADRYL) 12.5 MG/5ML liquid Take 25 mg by mouth at bedtime as needed for sleep.    Marland Kitchen ezetimibe (ZETIA) 10 MG tablet Take 10 mg by mouth every evening.    . fexofenadine (ALLEGRA) 180 MG tablet Take 180 mg by mouth daily as needed for allergies.    Marland Kitchen ibuprofen (ADVIL,MOTRIN) 200 MG tablet Take 400 mg by mouth 2 (two) times daily as needed for headache or moderate pain.    Marland Kitchen ipratropium (ATROVENT)  0.06 % nasal spray INSTILL 2 SPRAYS IN NOSTRIL QID  0  . Lactobacillus (ACIDOPHILUS PO) Take 1 capsule by mouth daily.     . Macitentan (OPSUMIT) 10 MG TABS Take 1 tablet (10 mg total) by mouth daily. 30 tablet 11  . Multiple Vitamins-Minerals (CENTRUM CARDIO PO) Take 1 tablet by mouth daily.     . Omega-3 Fatty Acids (OMEGA 3 PO) Take 3,200 mg by mouth 2 (two) times daily. Nordic Natural ultimate Omega 3     . rosuvastatin (CRESTOR) 40 MG tablet Take 1 tablet (40 mg total) by mouth daily. 90 tablet 3  . vitamin B-12 (CYANOCOBALAMIN) 1000 MCG tablet Take 1,000 mcg by mouth daily.     No current facility-administered medications for this encounter.     Allergies:   Penicillins and Symbicort [budesonide-formoterol fumarate]    Social History:  The patient  reports that he quit smoking about 31 years ago. He has a 18.00 pack-year smoking history. He has never used smokeless tobacco. He reports that he drinks alcohol. He reports that he does not use drugs. He is married.   Family History:  The patient's family history includes Cancer in his sister; Heart attack in his father; Heart disease in his father; Stroke in his father.    PHYSICAL EXAM: VS:  BP 110/70 (BP Location: Left Arm, Patient Position: Sitting, Cuff Size: Normal)   Pulse 68   Wt 171 lb 12.8 oz (77.9 kg)   SpO2 (!) 79% Comment: Raynaud phenomenon  BMI 23.30 kg/m  , BMI There is no height or weight on file to calculate BMI. General:  Sitting on exam table. NAD. Visibly upset HEENT: normal Neck: supple. JVP 6-7 . Carotids 2+ bilat; no bruits. No lymphadenopathy or thryomegaly appreciated. Cor: PMI nondisplaced. Regular rate & rhythm. 2/6 TR. P2 mildly accentuated  Lungs: clear with mildly decreased BS throughout Abdomen: soft, NT/ND good BS. Extremities: no edema. + diffuse cyanosis and clubbing Neuro: alert & oriented x 3, cranial nerves grossly intact. moves all 4 extremities w/o difficulty. Affect  frustrated/tearful   Recent Labs: 02/03/2016: Brain Natriuretic Peptide 63.1 06/12/2016: ALT 20 12/02/2016: BUN 19; Creatinine, Ser 1.00; Hemoglobin 16.3; Platelets 130; Potassium 4.2; Sodium 143   Lipid Panel    Component Value Date/Time   CHOL 101 06/12/2016 0930   CHOL 117 02/06/2015 0853   TRIG 86 06/12/2016 0930   TRIG 120 02/06/2015 0853   HDL 37 (L) 06/12/2016 0930   HDL 35 (L) 02/06/2015 0853   CHOLHDL 2.7 06/12/2016 0930   LDLCALC 47 06/12/2016 0930   LDLCALC 58 02/06/2015 0853      ASSESSMENT AND PLAN:  1. PAH 2. CAD   --known CTO of RCA dating back to 2005  3. h/o obesity s/p weight loss 4. COPD   --severe 5. Raynaud's disease  Since last visit we have obtained VQ scan (negative except for severe COPD), hi-res CT (severe COPD) and has seen Rheumatology (felt not to have CTD).  Despite only mildly abnormal spirometry he has severe COPD on chest CT (reviewed personally). Thus I suspect PAH likely due mostly to hypoxemic lung disease (Group 3) with perhaps only minor component of Group I PAH.   I have recommended starting supplemental O2, overnight oximetry testing and Pulmonary Rehab. Given possible Group I component will continue Macitentan for now but watch closely for shunting. We have referred him to [facility] for O2.   He was extremely upset and tearful about the information today and asked repeatedly why this wasn't picked up before. I told him that his PFTs and CXR were unexpectedly good for his degree of COPD and that I though the work-up he has received to date has been quite good. I also discussed that the prognosis for Group 3 PAH likely better than that of Group 1. We also discussed that degree of COPD more severe than I would expect for his moderate smoking history (can check alpha-1-antitrypsin).   I also recommended that he return to see Dr. Lamonte Sakai to discuss further management of his PFTs but he asked to switch to Dr. Lake Bells so that he could "start again." I was  unable to reach Dr. Lamonte Sakai by phone during the appointment (he was post-call). I did call Dr. Lake Bells who reviewed the CT and also felt he had severe COPD.   Total time spent 45 minutes. Over half that time spent discussing above.    Glori Bickers, MD  2:47 PM

## 2017-02-02 ENCOUNTER — Other Ambulatory Visit: Payer: Self-pay | Admitting: *Deleted

## 2017-02-02 MED ORDER — EZETIMIBE 10 MG PO TABS: 10.0000 mg | ORAL_TABLET | Freq: Every evening | ORAL | 3 refills | Status: DC

## 2017-02-12 ENCOUNTER — Encounter: Payer: Self-pay | Admitting: Pulmonary Disease

## 2017-02-12 ENCOUNTER — Ambulatory Visit (INDEPENDENT_AMBULATORY_CARE_PROVIDER_SITE_OTHER): Payer: Medicare Other | Admitting: Pulmonary Disease

## 2017-02-12 ENCOUNTER — Telehealth: Payer: Self-pay | Admitting: Pulmonary Disease

## 2017-02-12 DIAGNOSIS — I272 Pulmonary hypertension, unspecified: Secondary | ICD-10-CM

## 2017-02-12 DIAGNOSIS — J432 Centrilobular emphysema: Secondary | ICD-10-CM | POA: Insufficient documentation

## 2017-02-12 DIAGNOSIS — J9611 Chronic respiratory failure with hypoxia: Secondary | ICD-10-CM

## 2017-02-12 MED ORDER — UMECLIDINIUM-VILANTEROL 62.5-25 MCG/INH IN AEPB: 1.0000 | INHALATION_SPRAY | Freq: Every day | RESPIRATORY_TRACT | 0 refills | Status: DC

## 2017-02-12 NOTE — Assessment & Plan Note (Addendum)
Today with ambulation and measuring his O2 saturation using a forehead oxygen saturation measurement device his O2 saturation dropped to shockingly low values. Even while using 2 L of oxygen his O2 saturation dropped to 70% while ambulating. Clearly this is a major contributor to his pulmonary hypertension. Presumably this is related to his centrilobular emphysema but it raises concern for either right to left shunting or primary pulmonary hypertension.  Plan: Use 6 L of oxygen with exertion, continuous Hold off on prescribing a portable oxygen concentrator considering the severity of his hypoxemia Does not need to use oxygen at rest I instructed him today to use 2 L of oxygen while sleeping We will try to make arrangements for an overnight oxygen test with a forehead probe if we can find someone who will actually do that He will likely need a home sleep study when he comes back on the next visit

## 2017-02-12 NOTE — Patient Instructions (Addendum)
For your COPD: Take Anoro 1 puff daily no matter how you feel We will see if one of the durable medical equipment companies can perform an overnight oximetry test with a forehead probe  Exercise regularly with cardio pulmonary rehabilitation  Use 6 L O2 with exertion, 2L oxygen at rest; you don't need it while at rest  We will see you back in about 2 weeks to see how you're doing with the Anoro, nurse practitioner visit

## 2017-02-12 NOTE — Telephone Encounter (Signed)
Can he see me today at 2:15?  I have an opening and can see him then

## 2017-02-12 NOTE — Assessment & Plan Note (Signed)
Moderate centrilobular emphysema in the upper lobes bilaterally noted on CT scanning of the chest this year. This is undoubtedly due to his prior tobacco use. Despite this he only has mild airflow obstruction but severe symptoms.  Plan: Anoro daily, given today Pulmonary rehabilitation referral, exercise regularly Use oxygen at pulmonary rehabilitation with exertion

## 2017-02-12 NOTE — Telephone Encounter (Signed)
LMTCB

## 2017-02-12 NOTE — Assessment & Plan Note (Signed)
Clearly due at least in part to his centrilobular emphysema. He's had an extensive workup so far which shows basically no other abnormalities.  My question is whether or not he has primary pulmonary arteriopathy in addition to centrilobular emphysema considering the severity of his hypoxemia as well as the severity of the pressure seen on right heart catheterization.  Plan: Continue Opsumit for now We have prescribed the most potent pulmonary vasodilator with supplemental oxygen, told him extensively today that he needs to use this regularly We may want to consider repeating a right heart catheterization in a few months after using supplemental oxygen with exertion Plan for a sleep study when he follows up on the next visit

## 2017-02-12 NOTE — Telephone Encounter (Signed)
Pt added to see BQ at 2:15 today. Nothing further needed.

## 2017-02-12 NOTE — Progress Notes (Signed)
Subjective:    Patient ID: [name], male    DOB: 11/09/1945, 71 y.o.   MRN: [id number]  Synopsis: Patient followed by Dr. Lamonte Sakai for years transferred care to [facility] in 2018. Has a past medical history significant for pulmonary hypertension, COPD. 2018 CT scan of the chest showed centrilobular emphysema.  HPI Chief Complaint  Patient presents with  . COPD    referring doctor is Benismhon MD he needs his lungs checked out the doctor thinks he has COPD     Mr. [name] is here to see me for dyspnea. > he says that he has had mild dyspnea ever since he first saw a cardiologist years ago: he was told that this was due to an irregular heart beat > he was told to exercise more > he had a stress test and had a heart catheterization: no significant coronary disease > continued follow up with  > he says that one year ago he had pneumonia and he says that he didn't feel like he was getting over it > he says that climbing a hill makes him more short of breath > he says that the dyspnea continue to progress > when he gets dyspnea he doesn't feel chest tightness.  No wheezing.  > no clear triggers other than exertion/climbing a hill > no situational or environmental triggers that he can identify  He used to weigh about 240 pounds: he has lost weight with extensive exercise. > however with the dyspnea in the last few months he hasn't exercised much > he has tried to go back to playing golf but he had a hard time walking around > he would like to exercise more but he says he is fearful of going back to the gym  He ended up seeing Dr. Jeffie Pollock for dyspnea and pulmonary hypertension after a RHC that showed pulmonary hypertension. > he was prescribed oxygen by Dr. Jeffie Pollock for rehab  He smoked for about 20 years 1 ppd.  No family members have lung problems he thinks, though his mother had a lung mass when she died.  No childhood respiratory illnesses.     Past Medical History:  Diagnosis  Date  . CAD (coronary artery disease)   . GAD (generalized anxiety disorder)   . Hyperlipidemia   . Overweight(278.02)   . Pulmonary hypertension (HCC)      Family History  Problem Relation Age of Onset  . Cancer Sister        breast  . Heart disease Father   . Heart attack Father   . Stroke Father      Social History   Social History  . Marital status: Married    Spouse name: N/A  . Number of children: N/A  . Years of education: N/A   Occupational History  . Not on file.   Social History Main Topics  . Smoking status: Former Smoker    Packs/day: 1.00    Years: 18.00    Quit date: 09/27/1985  . Smokeless tobacco: Never Used  . Alcohol use Yes  . Drug use: No  . Sexual activity: Not on file   Other Topics Concern  . Not on file   Social History Narrative  . No narrative on file     Allergies  Allergen Reactions  . Penicillins Hives    Has patient had a PCN reaction causing immediate rash, facial/tongue/throat swelling, SOB or lightheadedness with hypotension: No Has patient had a PCN reaction causing severe rash involving  mucus membranes or skin necrosis: Yes Has patient had a PCN reaction that required hospitalization Unknown Has patient had a PCN reaction occurring within the last 10 years: No If all of the above answers are "NO", then may proceed with Cephalosporin use.   . Symbicort [Budesonide-Formoterol Fumarate]     Severe cough     Outpatient Medications Prior to Visit  Medication Sig Dispense Refill  . aspirin 81 MG tablet Take 81 mg by mouth every evening.     . clonazePAM (KLONOPIN) 0.5 MG tablet Take 0.5 mg by mouth 2 (two) times daily as needed for anxiety.   0  . Coenzyme Q10 (VITALINE COQ10) 60 MG TABS Take 60 mg by mouth daily.    . diphenhydrAMINE (BENADRYL) 12.5 MG/5ML liquid Take 25 mg by mouth at bedtime as needed for sleep.    Marland Kitchen ezetimibe (ZETIA) 10 MG tablet Take 1 tablet (10 mg total) by mouth every evening. 90 tablet 3  .  ibuprofen (ADVIL,MOTRIN) 200 MG tablet Take 400 mg by mouth 2 (two) times daily as needed for headache or moderate pain.    . Lactobacillus (ACIDOPHILUS PO) Take 1 capsule by mouth daily.     . Macitentan (OPSUMIT) 10 MG TABS Take 1 tablet (10 mg total) by mouth daily. 30 tablet 11  . Multiple Vitamins-Minerals (CENTRUM CARDIO PO) Take 1 tablet by mouth daily.     . Omega-3 Fatty Acids (OMEGA 3 PO) Take 3,200 mg by mouth 2 (two) times daily. Nordic Natural ultimate Omega 3     . rosuvastatin (CRESTOR) 40 MG tablet Take 1 tablet (40 mg total) by mouth daily. 90 tablet 3  . vitamin B-12 (CYANOCOBALAMIN) 1000 MCG tablet Take 1,000 mcg by mouth daily.    Marland Kitchen ipratropium (ATROVENT) 0.06 % nasal spray INSTILL 2 SPRAYS IN NOSTRIL QID  0   No facility-administered medications prior to visit.       Review of Systems  Constitutional: Negative for fever and unexpected weight change.  HENT: Negative for congestion, dental problem, ear pain, nosebleeds, postnasal drip, rhinorrhea, sinus pressure, sneezing, sore throat and trouble swallowing.   Eyes: Negative for redness and itching.  Respiratory: Positive for shortness of breath. Negative for cough, chest tightness and wheezing.   Cardiovascular: Negative for palpitations and leg swelling.  Gastrointestinal: Negative for nausea and vomiting.  Genitourinary: Negative for dysuria.  Musculoskeletal: Negative for joint swelling.  Skin: Negative for rash.  Allergic/Immunologic: Negative.  Negative for environmental allergies, food allergies and immunocompromised state.  Neurological: Negative for headaches.  Hematological: Does not bruise/bleed easily.  Psychiatric/Behavioral: Negative for dysphoric mood. The patient is not nervous/anxious.        Objective:   Physical Exam Vitals:   02/12/17 1421  BP: 118/68  Pulse: 62  SpO2: 90%  Weight: 180 lb (81.6 kg)  Height: 6' (1.829 m)   92% using forehead probe while at rest While walking using a  forehead probe O2 saturation dropped as low as 70% loss using 2 L of oxygen. With 6 L of continuous oxygen he was able to maintain O2 saturation greater than 90% while ambulating.  Gen: chronically ill appearing, no acute distress HENT: NCAT, OP clear, neck supple without masses Eyes: PERRL, EOMi Lymph: no cervical lymphadenopathy PULM: barrel chest, prolonged exhalation, no wheezing CV: RRR, no mgr, no JVD GI: BS+, soft, nontender, no hsm Derm: no rash or skin breakdown MSK: normal bulk and tone Neuro: A&Ox4, CN II-XII intact, strength 5/5 in all 4 extremities  Psyche: normal mood and affect   Chest imaging: April 2018 CT chest images independently reviewed showing moderate to severe centrilobular emphysema, 3 mm right upper lobe solid pulmonary nodule. Emphysema is upper lobe predominant April 2018 VQ scan showed some mismatch in the upper lobes where he has emphysema but otherwise no worrisome findings for chronic, embolic pulmonary hypertension  Echo: February 2018 LVEF 60-65% with grade 1 diastolic dysfunction RV mildly dilated with normal function.  PFT: January 2018: Ratio 65%, FEV1 2.96 L 85% predicted, 13 % change post bronchodilator, FVC 4.5 L 96% predicted, total lung capacity 6.84 L 92% predicted, DLCO 8.5 324% predicted  Heart Cath: 10/2016 RHC: RA 4, RV 78/4, PA 79/23 (43), PCW 4, Fick 5.2/2.5 10/2016 LHC:   Mild to moderate left sided CAD.  Mid RCA lesion, 100 %stenosed. 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  Ost LM to LM lesion, 20 %stenosed.  The left ventricular systolic function is normal.  LV end diastolic pressure is normal.     Assessment & Plan:  Chronic respiratory failure with hypoxia (HCC) Today with ambulation and measuring his O2 saturation using a forehead oxygen saturation measurement device his O2 saturation dropped to shockingly low values. Even while using 2 L of oxygen his O2 saturation dropped to 70% while  ambulating. Clearly this is a major contributor to his pulmonary hypertension. Presumably this is related to his centrilobular emphysema but it raises concern for either right to left shunting or primary pulmonary hypertension.  Plan: Use 6 L of oxygen with exertion, continuous Hold off on prescribing a portable oxygen concentrator considering the severity of his hypoxemia Does not need to use oxygen at rest I instructed him today to use 2 L of oxygen while sleeping We will try to make arrangements for an overnight oxygen test with a forehead probe if we can find someone who will actually do that He will likely need a home sleep study when he comes back on the next visit  Centrilobular emphysema (HCC) Moderate centrilobular emphysema in the upper lobes bilaterally noted on CT scanning of the chest this year. This is undoubtedly due to his prior tobacco use. Despite this he only has mild airflow obstruction but severe symptoms.  Plan: Anoro daily, given today Pulmonary rehabilitation referral, exercise regularly Use oxygen at pulmonary rehabilitation with exertion  Pulmonary hypertension ([place]) Clearly due at least in part to his centrilobular emphysema. He's had an extensive workup so far which shows basically no other abnormalities.  My question is whether or not he has primary pulmonary arteriopathy in addition to centrilobular emphysema considering the severity of his hypoxemia as well as the severity of the pressure seen on right heart catheterization.  Plan: Continue Opsumit for now We have prescribed the most potent pulmonary vasodilator with supplemental oxygen, told him extensively today that he needs to use this regularly We may want to consider repeating a right heart catheterization in a few months after using supplemental oxygen with exertion Plan for a sleep study when he follows up on the next visit    Current Outpatient Prescriptions:  .  aspirin 81 MG tablet, Take 81 mg  by mouth every evening. , Disp: , Rfl:  .  clonazePAM (KLONOPIN) 0.5 MG tablet, Take 0.5 mg by mouth 2 (two) times daily as needed for anxiety. , Disp: , Rfl: 0 .  Coenzyme Q10 (VITALINE COQ10) 60 MG TABS, Take 60 mg by mouth daily., Disp: , Rfl:  .  diphenhydrAMINE (BENADRYL) 12.5 MG/5ML liquid, Take 25 mg by mouth at bedtime as needed for sleep., Disp: , Rfl:  .  ezetimibe (ZETIA) 10 MG tablet, Take 1 tablet (10 mg total) by mouth every evening., Disp: 90 tablet, Rfl: 3 .  ibuprofen (ADVIL,MOTRIN) 200 MG tablet, Take 400 mg by mouth 2 (two) times daily as needed for headache or moderate pain., Disp: , Rfl:  .  Lactobacillus (ACIDOPHILUS PO), Take 1 capsule by mouth daily. , Disp: , Rfl:  .  Macitentan (OPSUMIT) 10 MG TABS, Take 1 tablet (10 mg total) by mouth daily., Disp: 30 tablet, Rfl: 11 .  Multiple Vitamins-Minerals (CENTRUM CARDIO PO), Take 1 tablet by mouth daily. , Disp: , Rfl:  .  Omega-3 Fatty Acids (OMEGA 3 PO), Take 3,200 mg by mouth 2 (two) times daily. Nordic Natural ultimate Omega 3 , Disp: , Rfl:  .  rosuvastatin (CRESTOR) 40 MG tablet, Take 1 tablet (40 mg total) by mouth daily., Disp: 90 tablet, Rfl: 3 .  vitamin B-12 (CYANOCOBALAMIN) 1000 MCG tablet, Take 1,000 mcg by mouth daily., Disp: , Rfl:  .  umeclidinium-vilanterol (ANORO ELLIPTA) 62.5-25 MCG/INH AEPB, Inhale 1 puff into the lungs daily., Disp: 14 each, Rfl: 0

## 2017-02-12 NOTE — Telephone Encounter (Signed)
Spoke with [name], who states that during his last OV with Dr. Haroldine Laws, it was discussed that [name] should f/u with Dr. Lake Bells. First available with BQ is 03/23/17. [name] feels that he should be seen sooner.  BQ please advise. Thanks.

## 2017-02-18 ENCOUNTER — Encounter: Payer: Self-pay | Admitting: Acute Care

## 2017-02-18 NOTE — Telephone Encounter (Signed)
Received following message from patient:    "I went to [facility] this morning for an evaluation for a portable unit. I did not pass because my Oxygen level did not stay at 90 or above using pulse oxygen. The therapist said i should give myself time on continuous oxygen at level 2 and I should start breathing exercises and exercise at the [facility] rehab center.  Would you please help me get started with both of these. Thanks. [name]"  Judson Roch, it appears the patient has an appt with you in July. He has previously seen BQ and RB. He was told to follow up with you after last OV with BQ.

## 2017-02-22 ENCOUNTER — Telehealth: Payer: Self-pay | Admitting: Acute Care

## 2017-02-22 NOTE — Telephone Encounter (Signed)
-----  Message from Valerie Salts, [place] sent at 02/18/2017 5:16 PM EDT -----      ----- Message sent from Valerie Salts, CMA to [place] at 02/18/2017 5:11 PM -----   Hello [name],     I will forward this message over to Nakea Gouger for her recommendations. As soon as we hear anything from her, we will let you know.     Take Care.     ----- Message -----   From: Rose Fillers. Wesch   Sent: 02/18/2017 2:34 PM EDT    To: Magdalen Spatz, NP  Subject: Visit Follow-Up Question    I went to [facility] this morning for an evaluation for a portable unit. I did not pass because my Oxygen level did not stay at 90 or above using pulse oxygen. The therapist said i should give myself time on continuous oxygen at level 2 and I should start breathing exercises and exercise at the [facility] rehab center.   Would you please help me get started with both of these. Thanks. [name] p   Please ensure this patient has an order for continuous oxygen at 2 L nasal cannula. Additionally please see if he meets qualifiers for pulmonary rehabilitation. If he meets qualifiers for pulmonary rehabilitation please place referral, however it will need to be done under his primary pulmonary doctor's name. Thank you

## 2017-02-22 NOTE — Telephone Encounter (Signed)
Message sent to ask patient.

## 2017-02-23 ENCOUNTER — Encounter: Payer: Self-pay | Admitting: Acute Care

## 2017-02-23 NOTE — Telephone Encounter (Signed)
(  per direct reply to Benetta Spar)  [place]  to Valerie Salts, [facility]       02/22/17 5:58 PM  I have the oxygen concentrator and tank fill machine. I also have two continuous oxygen tanks that last about 3 hours on a setting of 2. The valve ranges from .25 to 8. I went to [facility] last Thursday and did not qualify for a pulse unit. I hope this is the information you need.  Also I have 4 doses left on the Anoro samples I was given. Will there be a prescription written?   ------------------------------------

## 2017-02-23 NOTE — Telephone Encounter (Signed)
I have an oxygen concentrator set at 2. I have a tank filler unit on top. I have two portable continuous flow tanks with a guage to deliver continuous flow at from .25 to 8.  ----- Message -----  From: CMA Kelvin Cellar  Sent: 02/22/2017 5:11 PM EDT  To: Rose Fillers. N[place]  Subject: Oxygen  Mr. Whitmore, we have read back from [place].   She wants to know if you already have a continuous oxygen tank?

## 2017-02-23 NOTE — Telephone Encounter (Signed)
My searching shows that forehead oximeters are used by anesthesia personnel to measure oxygen concentration during surgery.Is this a source to replace my finger oximeters which may be thrown off by my raynounds condition.  Mikki Santee Hohman

## 2017-02-24 ENCOUNTER — Other Ambulatory Visit: Payer: Self-pay | Admitting: Pulmonary Disease

## 2017-02-24 MED ORDER — UMECLIDINIUM-VILANTEROL 62.5-25 MCG/INH IN AEPB: 1.0000 | INHALATION_SPRAY | Freq: Every day | RESPIRATORY_TRACT | 5 refills | Status: DC

## 2017-03-08 ENCOUNTER — Encounter: Payer: Self-pay | Admitting: Acute Care

## 2017-03-08 ENCOUNTER — Telehealth: Payer: Self-pay

## 2017-03-08 ENCOUNTER — Ambulatory Visit (INDEPENDENT_AMBULATORY_CARE_PROVIDER_SITE_OTHER): Payer: Medicare Other | Admitting: Acute Care

## 2017-03-08 VITALS — BP 120/60 | HR 58 | Ht 72.0 in | Wt 180.6 lb

## 2017-03-08 DIAGNOSIS — I272 Pulmonary hypertension, unspecified: Secondary | ICD-10-CM

## 2017-03-08 DIAGNOSIS — J432 Centrilobular emphysema: Secondary | ICD-10-CM

## 2017-03-08 DIAGNOSIS — J9611 Chronic respiratory failure with hypoxia: Secondary | ICD-10-CM

## 2017-03-08 NOTE — Telephone Encounter (Signed)
Sherry please message Dr. Lake Bells. This is his patient, and I will him to make a decision about whether not using a finger probe is adequate for what he wants. Thank so much

## 2017-03-08 NOTE — Assessment & Plan Note (Signed)
Mild airflow obstruction with severe symptoms Plan Continue Anoro daily Call and insure pulmonary rehabilitation referral has been sent Encouraged patient to do passive chair exercising.( Silver slippers program) Encouraged patient to start walking short distances with oxygen at 6 L nasal cannula, and eventually increase distance slowly.

## 2017-03-08 NOTE — Progress Notes (Signed)
History of Present Illness [name] is a 71 y.o. male former smoker( 18 pack years)  quit 08/1985.He has Pulmonary Hypertension and COPD, and centrilobular emphysema. He is followed by Dr. Lake Bells.  Synopsis: Patient followed by Dr. Lamonte Sakai for years transferred care to [facility] in 2018. Has a past medical history significant for pulmonary hypertension, COPD. 2018 CT scan of the chest showed centrilobular emphysema.  03/08/2017 2 week FU per Dr. Lake Bells: Pt presents for follow up. He was seen by Dr. Lake Bells 02/12/2017 for dyspnea. Plan after that visit was to continue using oxygen at 2 L with sleep, no need for oxygen at rest, and oxygen at 6 L  with exertion.He was started on Anoro, and was referred to Pulmonary Rehab. He was told to continue Opsumit for now. He was encouraged to use it regularly. Additionally Dr. Lake Bells wanted a sleep study scheduled at this follow-up visit. He presents today stating he can't tell any difference on his Anoro.He feels his dyspnea is unchanged. He states he is too nervous to be active so he has not been pushing himself.He is very focused on his saturation probe numbers, and therefore will not push himself. He has Raynaud's Syndrome and so I suspect the sat probes are not always accurate.He is asking about a support group for COPD. He states he is taking his Klonopin daily, which is prescribed by his PCP. He is wearing his oxygen at 2 L now, at rest sitting in the office. He is very anxious. His wife is frustrated with his lack of initiative and trying to do passive exercises. She is concerned that the patient is limiting his mobility and therefore his quality of life based on his anxiety. Patient was concerned that he had not yet heard from pulmonary rehabilitation.I explained that it is not unusual that it takes several weeks before a call.   Test Results: Chest imaging: April 2018 CT chest images independently reviewed showing moderate to severe centrilobular  emphysema, 3 mm right upper lobe solid pulmonary nodule. Emphysema is upper lobe predominant April 2018 VQ scan showed some mismatch in the upper lobes where he has emphysema but otherwise no worrisome findings for chronic, embolic pulmonary hypertension  Echo: February 2018 LVEF 60-65% with grade 1 diastolic dysfunction RV mildly dilated with normal function.  PFT: January 2018: Ratio 65%, FEV1 2.96 L 85% predicted, 13 % change post bronchodilator, FVC 4.5 L 96% predicted, total lung capacity 6.84 L 92% predicted, DLCO 8.5 324% predicted Mild airflow obstruction but severe symptoms.  Heart Cath: 10/2016 [facility]: RA 4, RV 78/4, PA 79/23 (43), PCW 4, Fick 5.2/2.5 10/2016 LHC:   Mild to moderate left sided CAD.  Mid RCA lesion, 100 %stenosed. 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  Ost LM to LM lesion, 20 %stenosed.  The left ventricular systolic function is normal.  LV end diastolic pressure is normal.  CBC Latest Ref Rng & Units 12/02/2016 12/02/2016 11/12/2016  WBC 4.0 - 10.5 K/uL - 9.5 7.1  Hemoglobin 13.0 - 17.0 g/dL 16.3 16.2 16.8  Hematocrit 39.0 - 52.0 % 48.0 46.9 49.0  Platelets 150 - 400 K/uL - 130(L) 136(L)    BMP Latest Ref Rng & Units 12/02/2016 11/12/2016 02/03/2016  Glucose 65 - 99 mg/dL 97 86 99  BUN 6 - 20 mg/dL _0 Creatinine 0.61 - 1.24 mg/dL 1.00 1.03 0.89  BUN/Creat Ratio 10 - 24 - 16 -  Sodium 135 - 145 mmol/L  143 143 141  Potassium 3.5 - 5.1 mmol/L 4.2 4.8 4.5  Chloride 101 - 111 mmol/L 104 104 107  CO2 18 - 29 mmol/L - 21 23  Calcium 8.6 - 10.2 mg/dL - 9.0 8.8    BNP    Component Value Date/Time   BNP 63.1 02/03/2016 0824    ProBNP No results found for: PROBNP  PFT    Component Value Date/Time   FEV1PRE 2.61 09/29/2016 0957   FEV1POST 2.96 09/29/2016 0957   FVCPRE 4.15 09/29/2016 0957   FVCPOST 4.58 09/29/2016 0957   TLC 6.84 09/29/2016 0957   DLCOUNC 8.53 09/29/2016 0957   PREFEV1FVCRT 63 09/29/2016 0957     PSTFEV1FVCRT 65 09/29/2016 0957    No results found.   Past medical hx Past Medical History:  Diagnosis Date  . CAD (coronary artery disease)   . GAD (generalized anxiety disorder)   . Hyperlipidemia   . Overweight(278.02)   . Pulmonary hypertension ([place])      Social History  Substance Use Topics  . Smoking status: Former Smoker    Packs/day: 1.00    Years: 18.00    Quit date: 09/27/1985  . Smokeless tobacco: Never Used  . Alcohol use Yes    Tobacco Cessation: Counseling given: Not Answered   Past surgical hx, Family hx, Social hx all reviewed.  Current Outpatient Prescriptions on File Prior to Visit  Medication Sig  . aspirin 81 MG tablet Take 81 mg by mouth every evening.   . clonazePAM (KLONOPIN) 0.5 MG tablet Take 0.5 mg by mouth 2 (two) times daily as needed for anxiety.   . Coenzyme Q10 (VITALINE COQ10) 60 MG TABS Take 60 mg by mouth daily.  . diphenhydrAMINE (BENADRYL) 12.5 MG/5ML liquid Take 25 mg by mouth at bedtime as needed for sleep.  Marland Kitchen ezetimibe (ZETIA) 10 MG tablet Take 1 tablet (10 mg total) by mouth every evening.  Marland Kitchen ibuprofen (ADVIL,MOTRIN) 200 MG tablet Take 400 mg by mouth 2 (two) times daily as needed for headache or moderate pain.  . Lactobacillus (ACIDOPHILUS PO) Take 1 capsule by mouth daily.   . Macitentan (OPSUMIT) 10 MG TABS Take 1 tablet (10 mg total) by mouth daily.  . Multiple Vitamins-Minerals (CENTRUM CARDIO PO) Take 1 tablet by mouth daily.   . Omega-3 Fatty Acids (OMEGA 3 PO) Take 3,200 mg by mouth 2 (two) times daily. Nordic Natural ultimate Omega 3   . umeclidinium-vilanterol (ANORO ELLIPTA) 62.5-25 MCG/INH AEPB Inhale 1 puff into the lungs daily.  . vitamin B-12 (CYANOCOBALAMIN) 1000 MCG tablet Take 1,000 mcg by mouth daily.  . rosuvastatin (CRESTOR) 40 MG tablet Take 1 tablet (40 mg total) by mouth daily.   No current facility-administered medications on file prior to visit.      Allergies  Allergen Reactions  . Penicillins  Hives    Has patient had a PCN reaction causing immediate rash, facial/tongue/throat swelling, SOB or lightheadedness with hypotension: No Has patient had a PCN reaction causing severe rash involving mucus membranes or skin necrosis: Yes Has patient had a PCN reaction that required hospitalization Unknown Has patient had a PCN reaction occurring within the last 10 years: No If all of the above answers are "NO", then may proceed with Cephalosporin use.   . Symbicort [Budesonide-Formoterol Fumarate]     Severe cough    Review Of Systems:  Constitutional:   No  weight loss, night sweats,  Fevers, chills, fatigue, or  lassitude.  HEENT:   No headaches,  Difficulty swallowing,  Tooth/dental problems, or  Sore throat,                No sneezing, itching, ear ache, nasal congestion, post nasal drip,   CV:  No chest pain,  Orthopnea, PND, swelling in lower extremities, anasarca, dizziness, palpitations, syncope.   GI  No heartburn, indigestion, abdominal pain, nausea, vomiting, diarrhea, change in bowel habits, loss of appetite, bloody stools.   Resp: + shortness of breath with exertion less at rest.  No excess mucus, no productive cough,  No non-productive cough,  No coughing up of blood.  No change in color of mucus.  No wheezing.  No chest wall deformity  Skin: no rash or lesions.  GU: no dysuria, change in color of urine, no urgency or frequency.  No flank pain, no hematuria   MS:  No joint pain or swelling.  No decreased range of motion.  No back pain.  Psych:  No change in mood or affect. + depression or anxiety.  No memory loss.   Vital Signs BP 120/60 (BP Location: Left Arm, Patient Position: Sitting, Cuff Size: Normal)   Pulse (!) 58   Ht 6' (1.829 m)   Wt 180 lb 9.6 oz (81.9 kg)   SpO2 96%   BMI 24.49 kg/m    Physical Exam:  General- No distress,  A&Ox3, anxious, wearing 2 L [place] at rest. ENT: No sinus tenderness, TM clear, pale nasal mucosa, no oral exudate,no post  nasal drip, no LAN Cardiac: S1, S2, regular rate and rhythm, no murmur, _ JVD Chest: No wheeze/ rales/ dullness; no accessory muscle use, no nasal flaring, no sternal retractions, barrel chest, prolonged expiration. Abd.: Soft Non-tender, non-distended, BS + Ext: No clubbing cyanosis, edema Neuro:  normal strength, with some deconditioning Skin: No rashes, warm and dry, no rash or lesions noted Psych: Anxious   Assessment/Plan  Chronic respiratory failure with hypoxia ([place]) Walked today with desaturations into the 70's. These corrected quickly with rest. 6L [place] with ambulation saturations remained > 90% Plan: Continue wearing oxygen at 6 L nasal cannula with exertion Continue using 2 L nasal cannula of oxygen with sleep Order overnight sleep test Okay to go without oxygen at rest Oxygen saturation goals are 88-92%  Centrilobular emphysema ([facility]) Mild airflow obstruction with severe symptoms Plan Continue Anoro daily Call and insure pulmonary rehabilitation referral has been sent Encouraged patient to do passive chair exercising.( Silver slippers program) Encouraged patient to start walking short distances with oxygen at 6 L nasal cannula, and eventually increase distance slowly.   Pulmonary hypertension ([facility]) Centrilobular emphysema Question primary pulmonary arteriopathy plus emphysema>> as cause of severity of hypoxia and elevated right heart pressures. Plan Continue Opsumit every day without fail Continue supplemental oxygen 2 L at night, and 6 L with exertion. Consider repeat right heart cath after trial with supplemental oxygen Sleep study to evaluate for nocturnal desaturations ordered 03/08/2017    Magdalen Spatz, NP 03/08/2017  3:08 PM

## 2017-03-08 NOTE — Assessment & Plan Note (Signed)
Walked today with desaturations into the 70's. These corrected quickly with rest. 6L [place] with ambulation saturations remained > 90% Plan: Continue wearing oxygen at 6 L nasal cannula with exertion Continue using 2 L nasal cannula of oxygen with sleep Order overnight sleep test Okay to go without oxygen at rest Oxygen saturation goals are 88-92%

## 2017-03-08 NOTE — Patient Instructions (Addendum)
It is nice to meet you today. We will call pulmonary rehab to make sure they have your  Referral. Continue using Anoro once daily. Continue taking Opsumit daily as you have been doing. We will schedule you for a sleep study today It is ok to take short walks with oxygen. Remember its ok to take oxygen off at rest. Wear oxygen at 6 L [place] with exertion. Oxygen saturation goals are > 90% We will walk you in the office today with the head probe to reassure you it is ok to exercise with oxygen.. Follow up with Dr. Lake Bells in 2 months or before as needed. Please contact office for sooner follow up if symptoms do not improve or worsen or seek emergency care

## 2017-03-08 NOTE — Telephone Encounter (Signed)
Nani Gasser Saddie Benders, [place]        Order has been put in for pt to have home sleep study. Order states pt would need a forehead probe for study due to Raynaud's disease. Our sleep machines do not allow for forehead probe - only finger. I called [facility] & their home sleep machines don't have forehead probe either. I have put a note in the order to hold until we hear back from you.    Judeen Hammans    SG FYI

## 2017-03-08 NOTE — Assessment & Plan Note (Signed)
Centrilobular emphysema Question primary pulmonary arteriopathy plus emphysema>> as cause of severity of hypoxia and elevated right heart pressures. Plan Continue Opsumit every day without fail Continue supplemental oxygen 2 L at night, and 6 L with exertion. Consider repeat right heart cath after trial with supplemental oxygen Sleep study to evaluate for nocturnal desaturations ordered 03/08/2017

## 2017-03-08 NOTE — Telephone Encounter (Signed)
Dr Lake Bells, I was asked to send you this message.  Pt saw SG today.

## 2017-03-09 ENCOUNTER — Telehealth: Payer: Self-pay | Admitting: *Deleted

## 2017-03-09 DIAGNOSIS — F419 Anxiety disorder, unspecified: Secondary | ICD-10-CM

## 2017-03-09 NOTE — Telephone Encounter (Signed)
-----  Message from Magdalen Spatz, NP sent at 03/08/2017  4:43 PM EDT ----- Regarding: FW: Counseling for anxiety Please place a referral for this patient to see Dr. Clovis Pu psychiatrist for his anxiety. I believe this is Keith's brother. If this does not work out, Nutritional therapist. Thanks so much ----- Message ----- From: Juanito Doom, MD Sent: 03/08/2017   2:19 PM To: Magdalen Spatz, NP Subject: RE: Counseling for anxiety                     [place] [facility]ry [facility] ----- Message ----- From: Magdalen Spatz, NP Sent: 03/08/2017  10:34 AM To: Juanito Doom, MD Subject: Counseling for anxiety                         Pt is asking about counseling for his anxiety. I told him anyone is appropriate, but he wanted to ask me if you have anyone you refer to for anxiety.Just let me know.Thanks, Judson Roch

## 2017-03-09 NOTE — Telephone Encounter (Signed)
Referral has been placed per SG. Nothing further was needed.

## 2017-03-09 NOTE — Telephone Encounter (Signed)
I would prefer the forehead probe, but we inquired and couldn't find anyone to do it.  Try calling around again to [place] and [facility] to see if they could accommodate.  He has Reynaud's so it would be very helpful.

## 2017-03-09 NOTE — Progress Notes (Signed)
Reviewed, agree 

## 2017-03-15 ENCOUNTER — Ambulatory Visit: Payer: Medicare Other | Admitting: Acute Care

## 2017-03-16 NOTE — Telephone Encounter (Signed)
I have checked with [facility] - they do not have the forehead probe.  I also checked & our sleep lab's inlab studies use the finger probe.  The only place that uses the forehead probe that I can find is [facility] at [facility] & it would have to be an inlab study.  Order needs to be put in for inlab study for them if this is what is needed.

## 2017-03-16 NOTE — Telephone Encounter (Signed)
BQ ok to change order to an in-lab study to accommodate with forehead probe?  Thanks

## 2017-03-17 NOTE — Telephone Encounter (Signed)
yes

## 2017-03-18 NOTE — Telephone Encounter (Signed)
Order placed for split night study w/ [facility] at [employer]. Nothing further needed; will sign off.

## 2017-03-19 ENCOUNTER — Encounter (HOSPITAL_COMMUNITY): Payer: Self-pay

## 2017-03-19 ENCOUNTER — Encounter (HOSPITAL_COMMUNITY)
Admission: RE | Admit: 2017-03-19 | Discharge: 2017-03-19 | Disposition: A | Discharge status: Home / Self Care | Payer: Medicare Other | Source: Ambulatory Visit | Attending: Internal Medicine | Admitting: Internal Medicine

## 2017-03-19 VITALS — BP 129/72 | HR 60 | Resp 18 | Ht 72.0 in | Wt 179.5 lb

## 2017-03-19 DIAGNOSIS — Z87891 Personal history of nicotine dependence: Secondary | ICD-10-CM | POA: Diagnosis not present

## 2017-03-19 DIAGNOSIS — E785 Hyperlipidemia, unspecified: Secondary | ICD-10-CM | POA: Diagnosis not present

## 2017-03-19 DIAGNOSIS — F411 Generalized anxiety disorder: Secondary | ICD-10-CM | POA: Insufficient documentation

## 2017-03-19 DIAGNOSIS — Z7982 Long term (current) use of aspirin: Secondary | ICD-10-CM | POA: Insufficient documentation

## 2017-03-19 DIAGNOSIS — Z79899 Other long term (current) drug therapy: Secondary | ICD-10-CM | POA: Insufficient documentation

## 2017-03-19 DIAGNOSIS — I2510 Atherosclerotic heart disease of native coronary artery without angina pectoris: Secondary | ICD-10-CM | POA: Insufficient documentation

## 2017-03-19 DIAGNOSIS — I272 Pulmonary hypertension, unspecified: Secondary | ICD-10-CM | POA: Diagnosis present

## 2017-03-19 NOTE — Progress Notes (Signed)
[name] 71 y.o. male Pulmonary Rehab Orientation Note Patient arrived today in [facility] for orientation to Pulmonary Rehab. He was transported from [employer] via wheel chair. He does carry portable oxygen. Per pt, he uses oxygen intermittently. He uses 2 liters continuously at night and 5 to 6 liters with exertion however he is unsure what "exertion exactly is". Patient encouraged to wear oxygen any time he was not sitting and to check his oxygen saturation frequently. Color good, skin warm and dry. Patient is oriented to time and place. Patient's medical history, psychosocial health, and medications reviewed. Psychosocial assessment reveals pt lives with their spouse. Pt is currently full time job as a Advice worker for [employer]. Pt hobbies include playing golf. Pt reports his stress level is high. Areas of stress/anxiety include Health Work Family. Pt does exhibit signs of depression. Signs of depression include anxiety, crying spells and sadness and difficulty maintaining sleep. PHQ2/9 score 2/5. Pt shows fair  coping skills with questionable outlook. He and his wife were offered emotional support and reassurance. He has been referred to a psychiatrist for depression however he is having a hard time getting an appointment. Have referred him to Jeanella Craze for a counceling appointment for next week. Will continue to monitor and evaluate progress toward psychosocial goal(s) of managing his depression and the morning of loss of health. Physical assessment reveals heart rate is normal, breath sounds mild rhonchi heard both lower lobes. Grip strength equal, strong. Distal pulses palpable. 2+ pitting edema noted  Patient reports he does take medications as prescribed. Patient states he follows a Regular diet. The patient reports no specific efforts to gain or lose weight.. Patient's weight will be monitored closely. Demonstration and practice of PLB using pulse oximeter.  Patient able to return demonstration satisfactorily. Safety and hand hygiene in the exercise area reviewed with patient. Patient voices understanding of the information reviewed. Department expectations discussed with patient and achievable goals were set. The patient shows enthusiasm about attending the program and we look forward to working with this nice gentleman. The patient is scheduled for a 6 min walk test on 03/23/17 and to begin exercise on 03/30/16.   45 minutes was spent on a variety of activities such as assessment of the patient, obtaining baseline data including height, weight, BMI, and grip strength, verifying medical history, allergies, and current medications, and teaching patient strategies for performing tasks with less respiratory effort with emphasis on pursed lip breathing.

## 2017-03-22 ENCOUNTER — Other Ambulatory Visit: Payer: Self-pay

## 2017-03-22 DIAGNOSIS — I7300 Raynaud's syndrome without gangrene: Secondary | ICD-10-CM

## 2017-03-22 DIAGNOSIS — I272 Pulmonary hypertension, unspecified: Secondary | ICD-10-CM

## 2017-03-23 ENCOUNTER — Encounter (HOSPITAL_COMMUNITY)
Admission: RE | Admit: 2017-03-23 | Discharge: 2017-03-23 | Disposition: A | Discharge status: Home / Self Care | Payer: Medicare Other | Source: Ambulatory Visit | Attending: Internal Medicine | Admitting: Internal Medicine

## 2017-03-23 VITALS — Ht 72.5 in | Wt 177.9 lb

## 2017-03-23 DIAGNOSIS — I272 Pulmonary hypertension, unspecified: Secondary | ICD-10-CM | POA: Diagnosis not present

## 2017-03-25 NOTE — Progress Notes (Signed)
Pulmonary Individual Treatment Plan  Patient Details  Name: [name] MRN: [id number] Date of Birth: May 24, 1946 Referring Provider:     Pulmonary Rehab Walk Test from 03/23/2017 in [place]  Referring Provider  Dr. Haroldine Laws      Initial Encounter Date:    Pulmonary Rehab Walk Test from 03/23/2017 in [place]  Date  03/25/17  Referring Provider  Dr. Haroldine Laws      Visit Diagnosis: Pulmonary hypertension ([place])  Patient's Home Medications on Admission:   Current Outpatient Prescriptions:  .  aspirin 81 MG tablet, Take 81 mg by mouth every evening. , Disp: , Rfl:  .  clonazePAM (KLONOPIN) 0.5 MG tablet, Take 0.5 mg by mouth 2 (two) times daily as needed for anxiety. , Disp: , Rfl: 0 .  Coenzyme Q10 (VITALINE COQ10) 60 MG TABS, Take 60 mg by mouth daily., Disp: , Rfl:  .  diphenhydrAMINE (BENADRYL) 12.5 MG/5ML liquid, Take 25 mg by mouth at bedtime as needed for sleep., Disp: , Rfl:  .  ezetimibe (ZETIA) 10 MG tablet, Take 1 tablet (10 mg total) by mouth every evening., Disp: 90 tablet, Rfl: 3 .  ibuprofen (ADVIL,MOTRIN) 200 MG tablet, Take 400 mg by mouth 2 (two) times daily as needed for headache or moderate pain., Disp: , Rfl:  .  Lactobacillus (ACIDOPHILUS PO), Take 1 capsule by mouth daily. , Disp: , Rfl:  .  Macitentan (OPSUMIT) 10 MG TABS, Take 1 tablet (10 mg total) by mouth daily., Disp: 30 tablet, Rfl: 11 .  Multiple Vitamins-Minerals (CENTRUM CARDIO PO), Take 1 tablet by mouth daily. , Disp: , Rfl:  .  Omega-3 Fatty Acids (OMEGA 3 PO), Take 3,200 mg by mouth 2 (two) times daily. Nordic Natural ultimate Omega 3 , Disp: , Rfl:  .  rosuvastatin (CRESTOR) 40 MG tablet, Take 1 tablet (40 mg total) by mouth daily., Disp: 90 tablet, Rfl: 3 .  umeclidinium-vilanterol (ANORO ELLIPTA) 62.5-25 MCG/INH AEPB, Inhale 1 puff into the lungs daily., Disp: 60 each, Rfl: 5 .  vitamin B-12 (CYANOCOBALAMIN) 1000 MCG tablet,  Take 1,000 mcg by mouth daily., Disp: , Rfl:   Past Medical History: Past Medical History:  Diagnosis Date  . CAD (coronary artery disease)   . GAD (generalized anxiety disorder)   . Hyperlipidemia   . Overweight(278.02)   . Pulmonary hypertension ([facility])     Tobacco Use: History  Smoking Status  . Former Smoker  . Packs/day: 1.00  . Years: 18.00  . Quit date: 09/27/1985  Smokeless Tobacco  . Never Used    Labs: Recent Review Flowsheet Data    Labs for ITP Cardiac and Pulmonary Rehab Latest Ref Rng & Units 11/25/2015 06/12/2016 11/24/2016 11/24/2016 12/02/2016   Cholestrol <200 mg/dL 118(L) 101 - - -   LDLCALC <100 mg/dL 50 47 - - -   HDL >40 mg/dL 37(L) 37(L) - - -   Trlycerides <150 mg/dL 155(H) 86 - - -   PHART 7.350 - 7.450 - - 7.416 - -   PCO2ART 32.0 - 48.0 mmHg - - 32.2 - -   HCO3 20.0 - 28.0 mmol/L - - 20.7 21.9 -   TCO2 0 - 100 mmol/L - - _0 ACIDBASEDEF 0.0 - 2.0 mmol/L - - 3.0(H) 2.0 -   O2SAT % - - 90.0 67.0 -      Capillary Blood Glucose: No results found for: GLUCAP   ADL UCSD:  Pulmonary Assessment Scores    Row Name 03/25/17 0700         ADL UCSD   ADL Phase Entry       mMRC Score   mMRC Score 2        Pulmonary Function Assessment:   Exercise Target Goals: Date: 03/25/17  Exercise Program Goal: Individual exercise prescription set with THRR, safety & activity barriers. Participant demonstrates ability to understand and report RPE using BORG scale, to self-measure pulse accurately, and to acknowledge the importance of the exercise prescription.  Exercise Prescription Goal: Starting with aerobic activity 30 plus minutes a day, 3 days per week for initial exercise prescription. Provide home exercise prescription and guidelines that participant acknowledges understanding prior to discharge.  Activity Barriers & Risk Stratification:   6 Minute Walk:     6 Minute Walk    Row Name 03/23/17 1712         6 Minute Walk    Phase Initial     Distance 853 feet     Walk Time 5.45 minutes     # of Rest Breaks 1     MPH 1.78     METS 1.43     RPE 11     Perceived Dyspnea  1     VO2 Peak 5.01     Symptoms No     Resting HR 64 bpm     Resting BP 110/70     Max Ex. HR 73 bpm     Max Ex. BP 106/70     2 Minute Post BP 98/64       Interval HR   Baseline HR 92     1 Minute HR 71     2 Minute HR 68     3 Minute HR 54     4 Minute HR 60     5 Minute HR 73     6 Minute HR 71     2 Minute Post HR 55     Interval Heart Rate? Yes       Interval Oxygen   Interval Oxygen? Yes     Baseline Oxygen Saturation % 92 %     Baseline Liters of Oxygen 2 L     1 Minute Oxygen Saturation % 85 %  increased to 87      1 Minute Liters of Oxygen 2 L     2 Minute Oxygen Saturation % 86 %  short rest break 15 seconds, incr. O2-->91%     2 Minute Liters of Oxygen 4 L     3 Minute Oxygen Saturation % 92 %     3 Minute Liters of Oxygen 6 L     4 Minute Oxygen Saturation % 91 %     4 Minute Liters of Oxygen 6 L     5 Minute Oxygen Saturation % 90 %     5 Minute Liters of Oxygen 6 L     6 Minute Oxygen Saturation % 90 %     6 Minute Liters of Oxygen 6 L     2 Minute Post Oxygen Saturation % 96 %     2 Minute Post Liters of Oxygen 3 L        Oxygen Initial Assessment:     Oxygen Initial Assessment - 03/25/17 0700      Initial 6 min Walk   Oxygen Used Continuous;E-Tanks   Liters per minute 6   Resting Oxygen Saturation  during 6  min walk 92 %  2liters   Exercise Oxygen Saturation  during 6 min walk 85 %  85-87% 2 liters 90% on 6 liters     Program Oxygen Prescription   Program Oxygen Prescription Continuous;E-Tanks   Liters per minute 6      Oxygen Re-Evaluation:   Oxygen Discharge (Final Oxygen Re-Evaluation):   Initial Exercise Prescription:     Initial Exercise Prescription - 03/25/17 0700      Date of Initial Exercise RX and Referring Provider   Date 03/25/17   Referring Provider Dr.  Haroldine Laws     Oxygen   Oxygen Continuous   Liters 6     Bike   Level 0.5   Minutes 17     NuStep   Level 2   Minutes 17   METs 1.5     Track   Laps 7   Minutes 17     Prescription Details   Frequency (times per week) 2   Duration Progress to 45 minutes of aerobic exercise without signs/symptoms of physical distress     Intensity   THRR 40-80% of Max Heartrate 60-119   Ratings of Perceived Exertion 11-13   Perceived Dyspnea 0-4     Progression   Progression Continue progressive overload as per policy without signs/symptoms or physical distress.     Resistance Training   Training Prescription Yes   Weight orange bands   Reps 10-15      Perform Capillary Blood Glucose checks as needed.  Exercise Prescription Changes:   Exercise Comments:   Exercise Goals and Review:     Exercise Goals    Row Name 03/19/17 1125             Exercise Goals   Increase Physical Activity Yes       Intervention Provide advice, education, support and counseling about physical activity/exercise needs.;Develop an individualized exercise prescription for aerobic and resistive training based on initial evaluation findings, risk stratification, comorbidities and participant's personal goals.       Expected Outcomes Achievement of increased cardiorespiratory fitness and enhanced flexibility, muscular endurance and strength shown through measurements of functional capacity and personal statement of participant.       Increase Strength and Stamina Yes       Intervention Provide advice, education, support and counseling about physical activity/exercise needs.;Develop an individualized exercise prescription for aerobic and resistive training based on initial evaluation findings, risk stratification, comorbidities and participant's personal goals.       Expected Outcomes Achievement of increased cardiorespiratory fitness and enhanced flexibility, muscular endurance and strength shown through  measurements of functional capacity and personal statement of participant.          Exercise Goals Re-Evaluation :   Discharge Exercise Prescription (Final Exercise Prescription Changes):   Nutrition:  Target Goals: Understanding of nutrition guidelines, daily intake of sodium <1567m, cholesterol <2011m calories 30% from fat and 7% or less from saturated fats, daily to have 5 or more servings of fruits and vegetables.  Biometrics:     Pre Biometrics - 03/25/17 0700      Pre Biometrics   Grip Strength 36 kg       Nutrition Therapy Plan and Nutrition Goals:   Nutrition Discharge: Rate Your Plate Scores:   Nutrition Goals Re-Evaluation:   Nutrition Goals Discharge (Final Nutrition Goals Re-Evaluation):   Psychosocial: Target Goals: Acknowledge presence or absence of significant depression and/or stress, maximize coping skills, provide positive support system. Participant is able to verbalize  types and ability to use techniques and skills needed for reducing stress and depression.  Initial Review & Psychosocial Screening:     Initial Psych Review & Screening - 03/19/17 1149      Initial Review   Current issues with Current Depression     Family Dynamics   Good Support System? Yes     Barriers   Psychosocial barriers to participate in program Psychosocial barriers identified (see note);The patient should benefit from training in stress management and relaxation.     Screening Interventions   Interventions Yes;To provide support and resources with identified psychosocial needs;Provide feedback about the scores to participant;Program counselor consult;Encouraged to exercise   Expected Outcomes Short Term goal: Utilizing psychosocial counselor, staff and physician to assist with identification of specific Stressors or current issues interfering with healing process. Setting desired goal for each stressor or current issue identified.      Quality of Life  Scores:   PHQ-9: Recent Review Flowsheet Data    Depression screen [facility] 2/9 03/19/2017   Decreased Interest 0   Down, Depressed, Hopeless 2   PHQ - 2 Score 2   Altered sleeping 0   Tired, decreased energy 1   Change in appetite 0   Feeling bad or failure about yourself  2   Trouble concentrating 0   Moving slowly or fidgety/restless 0   Suicidal thoughts 0   PHQ-9 Score 5   Difficult doing work/chores Not difficult at all     Interpretation of Total Score  Total Score Depression Severity:  1-4 = Minimal depression, 5-9 = Mild depression, 10-14 = Moderate depression, 15-19 = Moderately severe depression, 20-27 = Severe depression   Psychosocial Evaluation and Intervention:     Psychosocial Evaluation - 03/19/17 1159      Psychosocial Evaluation & Interventions   Comments (P)  referral to [facility]  (P)  Follow up required by staff      Psychosocial Re-Evaluation:   Psychosocial Discharge (Final Psychosocial Re-Evaluation):   Education: Education Goals: Education classes will be provided on a weekly basis, covering required topics. Participant will state understanding/return demonstration of topics presented.  Learning Barriers/Preferences:     Learning Barriers/Preferences - 03/19/17 1147      Learning Barriers/Preferences   Learning Barriers None   Learning Preferences Written Material;Skilled Demonstration;Individual Instruction;Group Instruction      Education Topics: Risk Factor Reduction:  -Group instruction that is supported by a PowerPoint presentation. Instructor discusses the definition of a risk factor, different risk factors for pulmonary disease, and how the heart and lungs work together.     Nutrition for Pulmonary Patient:  -Group instruction provided by PowerPoint slides, verbal discussion, and written materials to support subject matter. The instructor gives an explanation and review of healthy diet  recommendations, which includes a discussion on weight management, recommendations for fruit and vegetable consumption, as well as protein, fluid, caffeine, fiber, sodium, sugar, and alcohol. Tips for eating when patients are short of breath are discussed.   Pursed Lip Breathing:  -Group instruction that is supported by demonstration and informational handouts. Instructor discusses the benefits of pursed lip and diaphragmatic breathing and detailed demonstration on how to preform both.     Oxygen Safety:  -Group instruction provided by PowerPoint, verbal discussion, and written material to support subject matter. There is an overview of "What is Oxygen" and "Why do we need it".  Instructor also reviews how to create a safe environment for oxygen use,  the importance of using oxygen as prescribed, and the risks of noncompliance. There is a brief discussion on traveling with oxygen and resources the patient may utilize.   Oxygen Equipment:  -Group instruction provided by [facility] Staff utilizing handouts, written materials, and equipment demonstrations.   Signs and Symptoms:  -Group instruction provided by written material and verbal discussion to support subject matter. Warning signs and symptoms of infection, stroke, and heart attack are reviewed and when to call the physician/911 reinforced. Tips for preventing the spread of infection discussed.   Advanced Directives:  -Group instruction provided by verbal instruction and written material to support subject matter. Instructor reviews Advanced Directive laws and proper instruction for filling out document.   Pulmonary Video:  -Group video education that reviews the importance of medication and oxygen compliance, exercise, good nutrition, pulmonary hygiene, and pursed lip and diaphragmatic breathing for the pulmonary patient.   Exercise for the Pulmonary Patient:  -Group instruction that is supported by a PowerPoint presentation.  Instructor discusses benefits of exercise, core components of exercise, frequency, duration, and intensity of an exercise routine, importance of utilizing pulse oximetry during exercise, safety while exercising, and options of places to exercise outside of rehab.     Pulmonary Medications:  -Verbally interactive group education provided by instructor with focus on inhaled medications and proper administration.   Anatomy and Physiology of the Respiratory System and Intimacy:  -Group instruction provided by PowerPoint, verbal discussion, and written material to support subject matter. Instructor reviews respiratory cycle and anatomical components of the respiratory system and their functions. Instructor also reviews differences in obstructive and restrictive respiratory diseases with examples of each. Intimacy, Sex, and Sexuality differences are reviewed with a discussion on how relationships can change when diagnosed with pulmonary disease. Common sexual concerns are reviewed.   MD DAY -A group question and answer session with a medical doctor that allows participants to ask questions that relate to their pulmonary disease state.   OTHER EDUCATION -Group or individual verbal, written, or video instructions that support the educational goals of the pulmonary rehab program.   Knowledge Questionnaire Score:   Core Components/Risk Factors/Patient Goals at Admission:     Personal Goals and Risk Factors at Admission - 03/19/17 1148      Core Components/Risk Factors/Patient Goals on Admission   Improve shortness of breath with ADL's Yes   Intervention Provide education, individualized exercise plan and daily activity instruction to help decrease symptoms of SOB with activities of daily living.   Expected Outcomes Short Term: Achieves a reduction of symptoms when performing activities of daily living.   Develop more efficient breathing techniques such as purse lipped breathing and diaphragmatic  breathing; and practicing self-pacing with activity Yes   Intervention Provide education, demonstration and support about specific breathing techniuqes utilized for more efficient breathing. Include techniques such as pursed lipped breathing, diaphragmatic breathing and self-pacing activity.   Expected Outcomes Short Term: Participant will be able to demonstrate and use breathing techniques as needed throughout daily activities.   Stress Yes   Intervention Offer individual and/or small group education and counseling on adjustment to heart disease, stress management and health-related lifestyle change. Teach and support self-help strategies.;Refer participants experiencing significant psychosocial distress to appropriate mental health specialists for further evaluation and treatment. When possible, include family members and significant others in education/counseling sessions.   Expected Outcomes Short Term: Participant demonstrates changes in health-related behavior, relaxation and other stress management skills, ability to obtain effective social support, and compliance  with psychotropic medications if prescribed.;Long Term: Emotional wellbeing is indicated by absence of clinically significant psychosocial distress or social isolation.      Core Components/Risk Factors/Patient Goals Review:    Core Components/Risk Factors/Patient Goals at Discharge (Final Review):    ITP Comments:   Comments:

## 2017-03-30 ENCOUNTER — Encounter (HOSPITAL_COMMUNITY)
Admission: RE | Admit: 2017-03-30 | Discharge: 2017-03-30 | Disposition: A | Discharge status: Home / Self Care | Payer: Medicare Other | Source: Ambulatory Visit | Attending: Internal Medicine | Admitting: Internal Medicine

## 2017-03-30 VITALS — Wt 163.1 lb

## 2017-03-30 DIAGNOSIS — I272 Pulmonary hypertension, unspecified: Secondary | ICD-10-CM

## 2017-03-30 NOTE — Progress Notes (Signed)
Daily Session Note  Patient Details  Name: [name] MRN: [id number] Date of Birth: [birth year] Referring Provider:     Pulmonary Rehab Walk Test from 03/23/2017 in [place]  Referring Provider  Dr. Haroldine Laws      Encounter Date: 03/30/2017  Check In:     Session Check In - 03/30/17 1230      Check-In   Location [facility]   Staff Present Su Hilt, MS, ACSM RCEP, Exercise Physiologist;Lisa Ysidro Evert, RN   Supervising physician immediately available to respond to emergencies [facility] immediately available   Physician(s) Dr. Tobin Chad   Medication changes reported     No   Fall or balance concerns reported    No   Tobacco Cessation No Change   Warm-up and Cool-down Performed as group-led instruction   Resistance Training Performed Yes   VAD Patient? No     Pain Assessment   Currently in Pain? No/denies   Multiple Pain Sites No      Capillary Blood Glucose: No results found for this or any previous visit (from the past 24 hour(s)).      Exercise Prescription Changes - 03/30/17 1200      Response to Exercise   Blood Pressure (Admit) 118/70   Blood Pressure (Exercise) 122/70   Blood Pressure (Exit) 96/62   Heart Rate (Admit) 59 bpm   Heart Rate (Exercise) 86 bpm   Heart Rate (Exit) 62 bpm   Oxygen Saturation (Admit) 98 %   Oxygen Saturation (Exercise) 84 %   Oxygen Saturation (Exit) 94 %   Rating of Perceived Exertion (Exercise) 11   Perceived Dyspnea (Exercise) 1   Duration Continue with 45 min of aerobic exercise without signs/symptoms of physical distress.   Intensity THRR unchanged     Progression   Progression Continue to progress workloads to maintain intensity without signs/symptoms of physical distress.     Resistance Training   Training Prescription Yes   Weight orange bands   Reps 10-15   Time 10 Minutes     Oxygen   Oxygen Continuous   Liters 6     Treadmill   MPH 1.6   Grade 0   Minutes 17     Bike   Level 0.5   Minutes 17     NuStep   Level 2   Minutes 17   METs 1.7      History  Smoking Status  . Former Smoker  . Packs/day: 1.00  . Years: 18.00  . Quit date: 09/27/1985  Smokeless Tobacco  . Never Used    Goals Met:  Exercise tolerated well No report of cardiac concerns or symptoms Strength training completed today  Goals Unmet:  Not Applicable  Comments: Service time is from 10:30a to 12:00p     Dr. Rush Farmer is Medical Director for Pulmonary Rehab at [facility].

## 2017-03-30 NOTE — Progress Notes (Signed)
Pulmonary Individual Treatment Plan  Patient Details  Name: [name] MRN: [id number] Date of Birth: May 24, 1946 Referring Provider:     Pulmonary Rehab Walk Test from 03/23/2017 in [facility]  Referring Provider  Dr. Haroldine Laws      Initial Encounter Date:    Pulmonary Rehab Walk Test from 03/23/2017 in [facility]  Date  03/25/17  Referring Provider  Dr. Haroldine Laws      Visit Diagnosis: Pulmonary hypertension ([place])  Patient's Home Medications on Admission:   Current Outpatient Prescriptions:  .  aspirin 81 MG tablet, Take 81 mg by mouth every evening. , Disp: , Rfl:  .  clonazePAM (KLONOPIN) 0.5 MG tablet, Take 0.5 mg by mouth 2 (two) times daily as needed for anxiety. , Disp: , Rfl: 0 .  Coenzyme Q10 (VITALINE COQ10) 60 MG TABS, Take 60 mg by mouth daily., Disp: , Rfl:  .  diphenhydrAMINE (BENADRYL) 12.5 MG/5ML liquid, Take 25 mg by mouth at bedtime as needed for sleep., Disp: , Rfl:  .  ezetimibe (ZETIA) 10 MG tablet, Take 1 tablet (10 mg total) by mouth every evening., Disp: 90 tablet, Rfl: 3 .  ibuprofen (ADVIL,MOTRIN) 200 MG tablet, Take 400 mg by mouth 2 (two) times daily as needed for headache or moderate pain., Disp: , Rfl:  .  Lactobacillus (ACIDOPHILUS PO), Take 1 capsule by mouth daily. , Disp: , Rfl:  .  Macitentan (OPSUMIT) 10 MG TABS, Take 1 tablet (10 mg total) by mouth daily., Disp: 30 tablet, Rfl: 11 .  Multiple Vitamins-Minerals (CENTRUM CARDIO PO), Take 1 tablet by mouth daily. , Disp: , Rfl:  .  Omega-3 Fatty Acids (OMEGA 3 PO), Take 3,200 mg by mouth 2 (two) times daily. Nordic Natural ultimate Omega 3 , Disp: , Rfl:  .  rosuvastatin (CRESTOR) 40 MG tablet, Take 1 tablet (40 mg total) by mouth daily., Disp: 90 tablet, Rfl: 3 .  umeclidinium-vilanterol (ANORO ELLIPTA) 62.5-25 MCG/INH AEPB, Inhale 1 puff into the lungs daily., Disp: 60 each, Rfl: 5 .  vitamin B-12 (CYANOCOBALAMIN) 1000 MCG tablet,  Take 1,000 mcg by mouth daily., Disp: , Rfl:   Past Medical History: Past Medical History:  Diagnosis Date  . CAD (coronary artery disease)   . GAD (generalized anxiety disorder)   . Hyperlipidemia   . Overweight(278.02)   . Pulmonary hypertension ([facility])     Tobacco Use: History  Smoking Status  . Former Smoker  . Packs/day: 1.00  . Years: 18.00  . Quit date: 09/27/1985  Smokeless Tobacco  . Never Used    Labs: Recent Review Flowsheet Data    Labs for ITP Cardiac and Pulmonary Rehab Latest Ref Rng & Units 11/25/2015 06/12/2016 11/24/2016 11/24/2016 12/02/2016   Cholestrol <200 mg/dL 118(L) 101 - - -   LDLCALC <100 mg/dL 50 47 - - -   HDL >40 mg/dL 37(L) 37(L) - - -   Trlycerides <150 mg/dL 155(H) 86 - - -   PHART 7.350 - 7.450 - - 7.416 - -   PCO2ART 32.0 - 48.0 mmHg - - 32.2 - -   HCO3 20.0 - 28.0 mmol/L - - 20.7 21.9 -   TCO2 0 - 100 mmol/L - - _0 ACIDBASEDEF 0.0 - 2.0 mmol/L - - 3.0(H) 2.0 -   O2SAT % - - 90.0 67.0 -      Capillary Blood Glucose: No results found for: GLUCAP   ADL UCSD:  Pulmonary Assessment Scores    Row Name 03/25/17 0700         ADL UCSD   ADL Phase Entry       mMRC Score   mMRC Score 2        Pulmonary Function Assessment:   Exercise Target Goals:    Exercise Program Goal: Individual exercise prescription set with THRR, safety & activity barriers. Participant demonstrates ability to understand and report RPE using BORG scale, to self-measure pulse accurately, and to acknowledge the importance of the exercise prescription.  Exercise Prescription Goal: Starting with aerobic activity 30 plus minutes a day, 3 days per week for initial exercise prescription. Provide home exercise prescription and guidelines that participant acknowledges understanding prior to discharge.  Activity Barriers & Risk Stratification:   6 Minute Walk:     6 Minute Walk    Row Name 03/23/17 1712         6 Minute Walk   Phase Initial      Distance 853 feet     Walk Time 5.45 minutes     # of Rest Breaks 1     MPH 1.78     METS 1.43     RPE 11     Perceived Dyspnea  1     VO2 Peak 5.01     Symptoms No     Resting HR 64 bpm     Resting BP 110/70     Max Ex. HR 73 bpm     Max Ex. BP 106/70     2 Minute Post BP 98/64       Interval HR   Baseline HR 92     1 Minute HR 71     2 Minute HR 68     3 Minute HR 54     4 Minute HR 60     5 Minute HR 73     6 Minute HR 71     2 Minute Post HR 55     Interval Heart Rate? Yes       Interval Oxygen   Interval Oxygen? Yes     Baseline Oxygen Saturation % 92 %     Baseline Liters of Oxygen 2 L     1 Minute Oxygen Saturation % 85 %  increased to 87      1 Minute Liters of Oxygen 2 L     2 Minute Oxygen Saturation % 86 %  short rest break 15 seconds, incr. O2-->91%     2 Minute Liters of Oxygen 4 L     3 Minute Oxygen Saturation % 92 %     3 Minute Liters of Oxygen 6 L     4 Minute Oxygen Saturation % 91 %     4 Minute Liters of Oxygen 6 L     5 Minute Oxygen Saturation % 90 %     5 Minute Liters of Oxygen 6 L     6 Minute Oxygen Saturation % 90 %     6 Minute Liters of Oxygen 6 L     2 Minute Post Oxygen Saturation % 96 %     2 Minute Post Liters of Oxygen 3 L        Oxygen Initial Assessment:     Oxygen Initial Assessment - 03/25/17 0700      Initial 6 min Walk   Oxygen Used Continuous;E-Tanks   Liters per minute 6   Resting Oxygen Saturation  during 6  min walk 92 %  2liters   Exercise Oxygen Saturation  during 6 min walk 85 %  85-87% 2 liters 90% on 6 liters     Program Oxygen Prescription   Program Oxygen Prescription Continuous;E-Tanks   Liters per minute 6      Oxygen Re-Evaluation:   Oxygen Discharge (Final Oxygen Re-Evaluation):   Initial Exercise Prescription:     Initial Exercise Prescription - 03/25/17 0700      Date of Initial Exercise RX and Referring Provider   Date 03/25/17   Referring Provider Dr. Haroldine Laws     Oxygen    Oxygen Continuous   Liters 6     Bike   Level 0.5   Minutes 17     NuStep   Level 2   Minutes 17   METs 1.5     Track   Laps 7   Minutes 17     Prescription Details   Frequency (times per week) 2   Duration Progress to 45 minutes of aerobic exercise without signs/symptoms of physical distress     Intensity   THRR 40-80% of Max Heartrate 60-119   Ratings of Perceived Exertion 11-13   Perceived Dyspnea 0-4     Progression   Progression Continue progressive overload as per policy without signs/symptoms or physical distress.     Resistance Training   Training Prescription Yes   Weight orange bands   Reps 10-15      Perform Capillary Blood Glucose checks as needed.  Exercise Prescription Changes:     Exercise Prescription Changes    Row Name 03/30/17 1200             Response to Exercise   Blood Pressure (Admit) 118/70       Blood Pressure (Exercise) 122/70       Blood Pressure (Exit) 96/62       Heart Rate (Admit) 59 bpm       Heart Rate (Exercise) 86 bpm       Heart Rate (Exit) 62 bpm       Oxygen Saturation (Admit) 98 %       Oxygen Saturation (Exercise) 84 %       Oxygen Saturation (Exit) 94 %       Rating of Perceived Exertion (Exercise) 11       Perceived Dyspnea (Exercise) 1       Duration Continue with 45 min of aerobic exercise without signs/symptoms of physical distress.       Intensity THRR unchanged         Progression   Progression Continue to progress workloads to maintain intensity without signs/symptoms of physical distress.         Resistance Training   Training Prescription Yes       Weight orange bands       Reps 10-15       Time 10 Minutes         Oxygen   Oxygen Continuous       Liters 6         Treadmill   MPH 1.6       Grade 0       Minutes 17         Bike   Level 0.5       Minutes 17         NuStep   Level 2       Minutes 17       METs 1.7  Exercise Comments:   Exercise Goals and Review:      Exercise Goals    Row Name 03/19/17 1125             Exercise Goals   Increase Physical Activity Yes       Intervention Provide advice, education, support and counseling about physical activity/exercise needs.;Develop an individualized exercise prescription for aerobic and resistive training based on initial evaluation findings, risk stratification, comorbidities and participant's personal goals.       Expected Outcomes Achievement of increased cardiorespiratory fitness and enhanced flexibility, muscular endurance and strength shown through measurements of functional capacity and personal statement of participant.       Increase Strength and Stamina Yes       Intervention Provide advice, education, support and counseling about physical activity/exercise needs.;Develop an individualized exercise prescription for aerobic and resistive training based on initial evaluation findings, risk stratification, comorbidities and participant's personal goals.       Expected Outcomes Achievement of increased cardiorespiratory fitness and enhanced flexibility, muscular endurance and strength shown through measurements of functional capacity and personal statement of participant.          Exercise Goals Re-Evaluation :     Exercise Goals Re-Evaluation    Row Name 03/26/17 1527             Exercise Goal Re-Evaluation   Exercise Goals Review Increase Strenth and Stamina;Increase Physical Activity       Comments Patient has only completed his 29mt. Will cont. to monitor and progress as able.        Expected Outcomes Through the exercise at rehab and at home, patient will increase physical activity, strength, and stamina.           Discharge Exercise Prescription (Final Exercise Prescription Changes):     Exercise Prescription Changes - 03/30/17 1200      Response to Exercise   Blood Pressure (Admit) 118/70   Blood Pressure (Exercise) 122/70   Blood Pressure (Exit) 96/62   Heart Rate (Admit)  59 bpm   Heart Rate (Exercise) 86 bpm   Heart Rate (Exit) 62 bpm   Oxygen Saturation (Admit) 98 %   Oxygen Saturation (Exercise) 84 %   Oxygen Saturation (Exit) 94 %   Rating of Perceived Exertion (Exercise) 11   Perceived Dyspnea (Exercise) 1   Duration Continue with 45 min of aerobic exercise without signs/symptoms of physical distress.   Intensity THRR unchanged     Progression   Progression Continue to progress workloads to maintain intensity without signs/symptoms of physical distress.     Resistance Training   Training Prescription Yes   Weight orange bands   Reps 10-15   Time 10 Minutes     Oxygen   Oxygen Continuous   Liters 6     Treadmill   MPH 1.6   Grade 0   Minutes 17     Bike   Level 0.5   Minutes 17     NuStep   Level 2   Minutes 17   METs 1.7      Nutrition:  Target Goals: Understanding of nutrition guidelines, daily intake of sodium <15026m cholesterol <20062mcalories 30% from fat and 7% or less from saturated fats, daily to have 5 or more servings of fruits and vegetables.  Biometrics:     Pre Biometrics - 03/25/17 0700      Pre Biometrics   Grip Strength 36 kg       Nutrition Therapy Plan  and Nutrition Goals:   Nutrition Discharge: Rate Your Plate Scores:   Nutrition Goals Re-Evaluation:   Nutrition Goals Discharge (Final Nutrition Goals Re-Evaluation):   Psychosocial: Target Goals: Acknowledge presence or absence of significant depression and/or stress, maximize coping skills, provide positive support system. Participant is able to verbalize types and ability to use techniques and skills needed for reducing stress and depression.  Initial Review & Psychosocial Screening:     Initial Psych Review & Screening - 03/19/17 1149      Initial Review   Current issues with Current Depression     Family Dynamics   Good Support System? Yes     Barriers   Psychosocial barriers to participate in program Psychosocial barriers  identified (see note);The patient should benefit from training in stress management and relaxation.     Screening Interventions   Interventions Yes;To provide support and resources with identified psychosocial needs;Provide feedback about the scores to participant;Program counselor consult;Encouraged to exercise   Expected Outcomes Short Term goal: Utilizing psychosocial counselor, staff and physician to assist with identification of specific Stressors or current issues interfering with healing process. Setting desired goal for each stressor or current issue identified.      Quality of Life Scores:   PHQ-9: Recent Review Flowsheet Data    Depression screen [facility] 2/9 03/19/2017   Decreased Interest 0   Down, Depressed, Hopeless 2   PHQ - 2 Score 2   Altered sleeping 0   Tired, decreased energy 1   Change in appetite 0   Feeling bad or failure about yourself  2   Trouble concentrating 0   Moving slowly or fidgety/restless 0   Suicidal thoughts 0   PHQ-9 Score 5   Difficult doing work/chores Not difficult at all     Interpretation of Total Score  Total Score Depression Severity:  1-4 = Minimal depression, 5-9 = Mild depression, 10-14 = Moderate depression, 15-19 = Moderately severe depression, 20-27 = Severe depression   Psychosocial Evaluation and Intervention:     Psychosocial Evaluation - 03/19/17 1159      Psychosocial Evaluation & Interventions   Comments (P)  referral to [facility]  (P)  Follow up required by staff      Psychosocial Re-Evaluation:   Psychosocial Discharge (Final Psychosocial Re-Evaluation):   Education: Education Goals: Education classes will be provided on a weekly basis, covering required topics. Participant will state understanding/return demonstration of topics presented.  Learning Barriers/Preferences:     Learning Barriers/Preferences - 03/19/17 1147      Learning Barriers/Preferences   Learning  Barriers None   Learning Preferences Written Material;Skilled Demonstration;Individual Instruction;Group Instruction      Education Topics: Risk Factor Reduction:  -Group instruction that is supported by a PowerPoint presentation. Instructor discusses the definition of a risk factor, different risk factors for pulmonary disease, and how the heart and lungs work together.     Nutrition for Pulmonary Patient:  -Group instruction provided by PowerPoint slides, verbal discussion, and written materials to support subject matter. The instructor gives an explanation and review of healthy diet recommendations, which includes a discussion on weight management, recommendations for fruit and vegetable consumption, as well as protein, fluid, caffeine, fiber, sodium, sugar, and alcohol. Tips for eating when patients are short of breath are discussed.   Pursed Lip Breathing:  -Group instruction that is supported by demonstration and informational handouts. Instructor discusses the benefits of pursed lip and diaphragmatic breathing and detailed demonstration on how  to preform both.     Oxygen Safety:  -Group instruction provided by PowerPoint, verbal discussion, and written material to support subject matter. There is an overview of "What is Oxygen" and "Why do we need it".  Instructor also reviews how to create a safe environment for oxygen use, the importance of using oxygen as prescribed, and the risks of noncompliance. There is a brief discussion on traveling with oxygen and resources the patient may utilize.   Oxygen Equipment:  -Group instruction provided by [facility] Staff utilizing handouts, written materials, and equipment demonstrations.   Signs and Symptoms:  -Group instruction provided by written material and verbal discussion to support subject matter. Warning signs and symptoms of infection, stroke, and heart attack are reviewed and when to call the physician/911 reinforced. Tips for  preventing the spread of infection discussed.   Advanced Directives:  -Group instruction provided by verbal instruction and written material to support subject matter. Instructor reviews Advanced Directive laws and proper instruction for filling out document.   Pulmonary Video:  -Group video education that reviews the importance of medication and oxygen compliance, exercise, good nutrition, pulmonary hygiene, and pursed lip and diaphragmatic breathing for the pulmonary patient.   Exercise for the Pulmonary Patient:  -Group instruction that is supported by a PowerPoint presentation. Instructor discusses benefits of exercise, core components of exercise, frequency, duration, and intensity of an exercise routine, importance of utilizing pulse oximetry during exercise, safety while exercising, and options of places to exercise outside of rehab.     Pulmonary Medications:  -Verbally interactive group education provided by instructor with focus on inhaled medications and proper administration.   Anatomy and Physiology of the Respiratory System and Intimacy:  -Group instruction provided by PowerPoint, verbal discussion, and written material to support subject matter. Instructor reviews respiratory cycle and anatomical components of the respiratory system and their functions. Instructor also reviews differences in obstructive and restrictive respiratory diseases with examples of each. Intimacy, Sex, and Sexuality differences are reviewed with a discussion on how relationships can change when diagnosed with pulmonary disease. Common sexual concerns are reviewed.   MD DAY -A group question and answer session with a medical doctor that allows participants to ask questions that relate to their pulmonary disease state.   OTHER EDUCATION -Group or individual verbal, written, or video instructions that support the educational goals of the pulmonary rehab program.   Knowledge Questionnaire  Score:   Core Components/Risk Factors/Patient Goals at Admission:     Personal Goals and Risk Factors at Admission - 03/19/17 1148      Core Components/Risk Factors/Patient Goals on Admission   Improve shortness of breath with ADL's Yes   Intervention Provide education, individualized exercise plan and daily activity instruction to help decrease symptoms of SOB with activities of daily living.   Expected Outcomes Short Term: Achieves a reduction of symptoms when performing activities of daily living.   Develop more efficient breathing techniques such as purse lipped breathing and diaphragmatic breathing; and practicing self-pacing with activity Yes   Intervention Provide education, demonstration and support about specific breathing techniuqes utilized for more efficient breathing. Include techniques such as pursed lipped breathing, diaphragmatic breathing and self-pacing activity.   Expected Outcomes Short Term: Participant will be able to demonstrate and use breathing techniques as needed throughout daily activities.   Stress Yes   Intervention Offer individual and/or small group education and counseling on adjustment to heart disease, stress management and health-related lifestyle change. Teach and support self-help strategies.;Refer participants  experiencing significant psychosocial distress to appropriate mental health specialists for further evaluation and treatment. When possible, include family members and significant others in education/counseling sessions.   Expected Outcomes Short Term: Participant demonstrates changes in health-related behavior, relaxation and other stress management skills, ability to obtain effective social support, and compliance with psychotropic medications if prescribed.;Long Term: Emotional wellbeing is indicated by absence of clinically significant psychosocial distress or social isolation.      Core Components/Risk Factors/Patient Goals Review:    Core  Components/Risk Factors/Patient Goals at Discharge (Final Review):    ITP Comments:   Comments: Today was patients first day of exercise. Will have a better idea of progress towards pulmonary rehab goals over the next 30 days. Recommend continued exercise, life style modification, education, and utilization of breathing techniques to increase stamina and strength and decrease shortness of breath with exertion.

## 2017-03-31 ENCOUNTER — Encounter (HOSPITAL_COMMUNITY): Payer: Self-pay | Admitting: Internal Medicine

## 2017-03-31 ENCOUNTER — Ambulatory Visit (HOSPITAL_COMMUNITY)
Admission: RE | Admit: 2017-03-31 | Discharge: 2017-03-31 | Disposition: A | Discharge status: Home / Self Care | Payer: Medicare Other | Source: Ambulatory Visit | Attending: Internal Medicine | Admitting: Internal Medicine

## 2017-03-31 VITALS — BP 120/68 | HR 69 | Wt 177.0 lb

## 2017-03-31 DIAGNOSIS — Z79899 Other long term (current) drug therapy: Secondary | ICD-10-CM | POA: Diagnosis not present

## 2017-03-31 DIAGNOSIS — F411 Generalized anxiety disorder: Secondary | ICD-10-CM | POA: Diagnosis not present

## 2017-03-31 DIAGNOSIS — Z880 Allergy status to penicillin: Secondary | ICD-10-CM | POA: Diagnosis not present

## 2017-03-31 DIAGNOSIS — Z7982 Long term (current) use of aspirin: Secondary | ICD-10-CM | POA: Insufficient documentation

## 2017-03-31 DIAGNOSIS — J449 Chronic obstructive pulmonary disease, unspecified: Secondary | ICD-10-CM | POA: Diagnosis not present

## 2017-03-31 DIAGNOSIS — I2510 Atherosclerotic heart disease of native coronary artery without angina pectoris: Secondary | ICD-10-CM | POA: Diagnosis not present

## 2017-03-31 DIAGNOSIS — I272 Pulmonary hypertension, unspecified: Secondary | ICD-10-CM | POA: Diagnosis present

## 2017-03-31 DIAGNOSIS — J9611 Chronic respiratory failure with hypoxia: Secondary | ICD-10-CM | POA: Diagnosis not present

## 2017-03-31 DIAGNOSIS — I2582 Chronic total occlusion of coronary artery: Secondary | ICD-10-CM | POA: Diagnosis not present

## 2017-03-31 DIAGNOSIS — Z9981 Dependence on supplemental oxygen: Secondary | ICD-10-CM | POA: Insufficient documentation

## 2017-03-31 DIAGNOSIS — R634 Abnormal weight loss: Secondary | ICD-10-CM | POA: Diagnosis not present

## 2017-03-31 DIAGNOSIS — E785 Hyperlipidemia, unspecified: Secondary | ICD-10-CM | POA: Diagnosis not present

## 2017-03-31 DIAGNOSIS — I25118 Atherosclerotic heart disease of native coronary artery with other forms of angina pectoris: Secondary | ICD-10-CM | POA: Diagnosis not present

## 2017-03-31 DIAGNOSIS — J9610 Chronic respiratory failure, unspecified whether with hypoxia or hypercapnia: Secondary | ICD-10-CM | POA: Diagnosis not present

## 2017-03-31 DIAGNOSIS — Z87891 Personal history of nicotine dependence: Secondary | ICD-10-CM | POA: Insufficient documentation

## 2017-03-31 DIAGNOSIS — J432 Centrilobular emphysema: Secondary | ICD-10-CM | POA: Diagnosis not present

## 2017-03-31 DIAGNOSIS — I7300 Raynaud's syndrome without gangrene: Secondary | ICD-10-CM | POA: Diagnosis not present

## 2017-03-31 DIAGNOSIS — J3489 Other specified disorders of nose and nasal sinuses: Secondary | ICD-10-CM | POA: Insufficient documentation

## 2017-03-31 NOTE — Patient Instructions (Signed)
Follow up 3 months with echocardiogram.  Take all medication as prescribed the day of your appointment. Bring all medications with you to your appointment.  Do the following things EVERYDAY: 1) Weigh yourself in the morning before breakfast. Write it down and keep it in a log. 2) Take your medicines as prescribed 3) Eat low salt foods-Limit salt (sodium) to 2000 mg per day.  4) Stay as active as you can everyday 5) Limit all fluids for the day to less than 2 liters

## 2017-03-31 NOTE — Progress Notes (Signed)
Patient ID: [name], male   DOB: [birth year], 71 y.o.   MRN: [id number]     ADVANCED HF CLINIC CONSULT NOTE   Date:  03/31/2017   ID:  [place], DOB 03/22/1946, MRN [id number]  PCP:  Lawerance Cruel, MD   REFERRING: Irish Lack   History of Present Illness: [name] is a 71 y.o. male with a h/o morbid obesity (s/p extensive weight loss), previous smoker quit in the 66s)m Raynaud's disease and CAD with a known CTO of the RCA (Cath 2005) who is referred by Dr. Irish Lack for further evaluation of pulmonary HTN.   Developed worsening dyspnea after PNA in 9/16. He is an avid Economist but wasstruggling to play due to his symptoms. He saw Dr. Lamonte Sakai and PFTs showed mild to moderate obstruction with +BD response and markedly reduced DLCO (29%). He was started on Symbicort and right heart cath was suggested. Autoimmune labs were done, shows ANA positive at 1:40, a-scl-70 negative. RF negative. With markedly reduced DLCO. Underwent R/L cath with stable CAD and moderate to severe PAH. (see below)  Initially felt to have Group I PAH (with PH out of proportion to COPD). VQ negative for PE. Hi res CT with moderate to severe COPD despite relatively normal spirometry on PFTs.   Has been on macitentan for a few months. Feeling better. Wearing O2 all the time. No edema. Just started Pulmonary Rehab. No syncope or presyncope.  Able to play 9 holes of golf. Goes out for walks and gets SOB at half a mile. 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  Saw Dr. Lake Bells who is working with him on hos COPD. He also questioned component of WHO Group I disease given severity of hypoxemia despite only mildy reduced spirometry.   Studies:  Had cath 3/18:  Mild nonobstructive CAD on left. CTO RCA with left to right and right to right colatterals (unchanged since 2005) RA 4 RV 78/4 PA 79/23 (43) PCW 4 Fick 5.2/2.5 Mild response to BDs  PFTs 1/18 FEV1  2.61 (75%) FVC    4.15 (87%) DLCO 24%  VQ: 4/18   Diminished perfusion in the RIGHT upper lobe corresponding to emphysematous changes on CT.  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  Echo 2/18: EF 65-70% Grade I DD RV mildly dilated with normal function. No septal flattening (reviewed personally)  CXR 12/02/16: Normal (reviewed personally)   Past Medical History:  Diagnosis Date  . CAD (coronary artery disease)   . GAD (generalized anxiety disorder)   . Hyperlipidemia   . Overweight(278.02)   . Pulmonary hypertension ([place])     Past Surgical History:  Procedure Laterality Date  . ANAL FISSURE REPAIR    . CARDIAC CATHETERIZATION  2005  . RIGHT/LEFT HEART CATH AND CORONARY ANGIOGRAPHY N/A 11/24/2016   Procedure: Right/Left Heart Cath and Coronary Angiography;  Surgeon: Jettie Booze, MD;  Location: [facility];  Service: Cardiovascular;  Laterality: N/A;  . SKIN CANCER EXCISION    . TONSILLECTOMY  1955  . UMBILICAL HERNIA REPAIR  10/07/2011   Procedure: HERNIA REPAIR UMBILICAL ADULT;  Surgeon: Rolm Bookbinder, MD;  Location: [facility];  Service: General;  Laterality: N/A;  with mesh      Current Outpatient Prescriptions  Medication Sig Dispense Refill  . aspirin 81 MG tablet Take 81 mg by mouth every evening.     . clonazePAM (KLONOPIN) 0.5 MG tablet Take  0.5 mg by mouth 2 (two) times daily as needed for anxiety.   0  . Coenzyme Q10 (VITALINE COQ10) 60 MG TABS Take 60 mg by mouth daily.    . diphenhydrAMINE (BENADRYL) 12.5 MG/5ML liquid Take 25 mg by mouth at bedtime as needed for sleep.    Marland Kitchen ezetimibe (ZETIA) 10 MG tablet Take 1 tablet (10 mg total) by mouth every evening. 90 tablet 3  . ibuprofen (ADVIL,MOTRIN) 200 MG tablet Take 400 mg by mouth 2 (two) times daily as needed for headache or moderate pain.    . Lactobacillus (ACIDOPHILUS PO) Take 1 capsule by mouth daily.     . Macitentan (OPSUMIT) 10 MG TABS Take 1 tablet (10 mg  total) by mouth daily. 30 tablet 11  . Multiple Vitamins-Minerals (CENTRUM CARDIO PO) Take 1 tablet by mouth daily.     . Omega-3 Fatty Acids (OMEGA 3 PO) Take 3,200 mg by mouth 2 (two) times daily. Nordic Natural ultimate Omega 3     . rosuvastatin (CRESTOR) 40 MG tablet Take 1 tablet (40 mg total) by mouth daily. 90 tablet 3  . umeclidinium-vilanterol (ANORO ELLIPTA) 62.5-25 MCG/INH AEPB Inhale 1 puff into the lungs daily. 60 each 5  . vitamin B-12 (CYANOCOBALAMIN) 1000 MCG tablet Take 1,000 mcg by mouth daily.     No current facility-administered medications for this encounter.     Allergies:   Penicillins and Symbicort [budesonide-formoterol fumarate]    Social History:  The patient  reports that he quit smoking about 31 years ago. He has a 18.00 pack-year smoking history. He has never used smokeless tobacco. He reports that he drinks alcohol. He reports that he does not use drugs. He is married.   Family History:  The patient's family history includes Cancer in his sister; Heart attack in his father; Heart disease in his father; Stroke in his father.    PHYSICAL EXAM: VS:  BP 120/68 (BP Location: Left Arm, Patient Position: Sitting, Cuff Size: Normal)   Pulse 69   Wt 177 lb (80.3 kg)   SpO2 90% Comment: on 2L  BMI 23.68 kg/m  , BMI Body mass index is 23.68 kg/m. General:  Well appearing. No resp difficulty Wearing O2 HEENT: normal Neck: supple. no JVD. Carotids 2+ bilat; no bruits. No lymphadenopathy or thryomegaly appreciated. Cor: PMI nondisplaced. Regular rate & rhythm. No rubs, gallops or murmurs. Lungs: clear with decreased BS Abdomen: soft, nontender, nondistended. No hepatosplenomegaly. No bruits or masses. Good bowel sounds. Extremities: no cyanosis, rash, edema  + clubbing Neuro: alert & orientedx3, cranial nerves grossly intact. moves all 4 extremities w/o difficulty. Affect pleasant    Recent Labs: 06/12/2016: ALT 20 12/02/2016: BUN 19; Creatinine, Ser 1.00;  Hemoglobin 16.3; Platelets 130; Potassium 4.2; Sodium 143   Lipid Panel    Component Value Date/Time   CHOL 101 06/12/2016 0930   CHOL 117 02/06/2015 0853   TRIG 86 06/12/2016 0930   TRIG 120 02/06/2015 0853   HDL 37 (L) 06/12/2016 0930   HDL 35 (L) 02/06/2015 0853   CHOLHDL 2.7 06/12/2016 0930   LDLCALC 47 06/12/2016 0930   LDLCALC 58 02/06/2015 0853      ASSESSMENT AND PLAN:  1. PAH - Suspect Combination of WHO Group I and III - VQ scan (negative except for severe COPD), hi-res CT (severe COPD) and has seen Rheumatology (felt not to have CTD). - Continue Macitnetan. He is having some rhinorrhea and we discussed adding Atrovent if continues - Given degree of  COPD, I am reluctant to add second agent at this time with risk of shunting. Will follow closely - Will see back in 3 months with echo. Can consider repeat RHC at that time. 2. CAD   --known CTO of RCA dating back to 2005   --no s/s of ischemia. Continue asa, statin 3. h/o obesity s/p weight loss 4. COPD   --moderate to severe. Follows with Dr. Karen Kays 5. Raynaud's disease 6. Chronic respiratory failure on home O2 - He remains very frustrated and somewhat bitter about his diagnosis. We discussed the possible use of more portable O2 tanks, etc. I have reached out to Dr. Lake Bells who will f/u with him to discuss further - Continue Pulmonary rehab   Total time spent 45 minutes. Over half that time spent discussing above.   Glori Bickers, MD  3:01 PM

## 2017-04-01 ENCOUNTER — Encounter (HOSPITAL_COMMUNITY)
Admission: RE | Admit: 2017-04-01 | Discharge: 2017-04-01 | Disposition: A | Discharge status: Home / Self Care | Payer: Medicare Other | Source: Ambulatory Visit | Attending: Internal Medicine | Admitting: Internal Medicine

## 2017-04-01 VITALS — Wt 177.7 lb

## 2017-04-01 DIAGNOSIS — I2510 Atherosclerotic heart disease of native coronary artery without angina pectoris: Secondary | ICD-10-CM | POA: Diagnosis not present

## 2017-04-01 DIAGNOSIS — Z79899 Other long term (current) drug therapy: Secondary | ICD-10-CM | POA: Diagnosis not present

## 2017-04-01 DIAGNOSIS — E785 Hyperlipidemia, unspecified: Secondary | ICD-10-CM | POA: Diagnosis not present

## 2017-04-01 DIAGNOSIS — I272 Pulmonary hypertension, unspecified: Secondary | ICD-10-CM | POA: Insufficient documentation

## 2017-04-01 DIAGNOSIS — Z87891 Personal history of nicotine dependence: Secondary | ICD-10-CM | POA: Insufficient documentation

## 2017-04-01 DIAGNOSIS — F411 Generalized anxiety disorder: Secondary | ICD-10-CM | POA: Diagnosis not present

## 2017-04-01 DIAGNOSIS — Z7982 Long term (current) use of aspirin: Secondary | ICD-10-CM | POA: Insufficient documentation

## 2017-04-01 NOTE — Progress Notes (Signed)
Daily Session Note  Patient Details  Name: [name] MRN: [id number] Date of Birth: 04/27/1946 Referring Provider:     Pulmonary Rehab Walk Test from 03/23/2017 in [facility]  Referring Provider  Dr. Bensimhon      Encounter Date: 04/01/2017  Check In:     Session Check In - 04/01/17 1052      Check-In   Location [facility]   Staff Present Molly diVincenzo, MS, ACSM RCEP, Exercise Physiologist;Lisa [facility], RN; , RN, BSN   Supervising physician immediately available to respond to emergencies [facility] immediately available   Physician(s) Dr. Schertz   Medication changes reported     No   Fall or balance concerns reported    No   Tobacco Cessation No Change   Warm-up and Cool-down Performed as group-led instruction   Resistance Training Performed Yes   VAD Patient? No     Pain Assessment   Currently in Pain? No/denies   Multiple Pain Sites No      Capillary Blood Glucose: No results found for this or any previous visit (from the past 24 hour(s)).      Exercise Prescription Changes - 04/01/17 1359      Response to Exercise   Blood Pressure (Admit) 100/64   Blood Pressure (Exit) 114/62   Heart Rate (Admit) 55 bpm   Heart Rate (Exercise) 88 bpm   Heart Rate (Exit) 53 bpm   Oxygen Saturation (Admit) 98 %   Oxygen Saturation (Exercise) 84 %   Oxygen Saturation (Exit) 94 %   Rating of Perceived Exertion (Exercise) 11   Perceived Dyspnea (Exercise) 1   Duration Continue with 45 min of aerobic exercise without signs/symptoms of physical distress.   Intensity THRR unchanged     Progression   Progression Continue to progress workloads to maintain intensity without signs/symptoms of physical distress.     Resistance Training   Training Prescription Yes   Weight orange bands   Reps 10-15   Time 10 Minutes     Oxygen   Oxygen Continuous   Liters 6     Treadmill   MPH 2   Grade 0   Minutes  17      History  Smoking Status  . Former Smoker  . Packs/day: 1.00  . Years: 18.00  . Quit date: 09/27/1985  Smokeless Tobacco  . Never Used    Goals Met:  Exercise tolerated well No report of cardiac concerns or symptoms Strength training completed today  Goals Unmet:  Not Applicable  Comments: Service time is from 1030 to 1215   Dr. Wesam G. Yacoub is Medical Director for Pulmonary Rehab at [facility]. 

## 2017-04-06 ENCOUNTER — Encounter (HOSPITAL_COMMUNITY)
Admission: RE | Admit: 2017-04-06 | Discharge: 2017-04-06 | Disposition: A | Discharge status: Home / Self Care | Payer: Medicare Other | Source: Ambulatory Visit | Attending: Internal Medicine | Admitting: Internal Medicine

## 2017-04-06 VITALS — Wt 178.1 lb

## 2017-04-06 DIAGNOSIS — I272 Pulmonary hypertension, unspecified: Secondary | ICD-10-CM | POA: Diagnosis not present

## 2017-04-06 NOTE — Progress Notes (Signed)
Daily Session Note  Patient Details  Name: [name] MRN: [id number] Date of Birth: 1946-07-19 Referring Provider:     Pulmonary Rehab Walk Test from 03/23/2017 in [place]  Referring Provider  Dr. Haroldine Laws      Encounter Date: 04/06/2017  Check In:     Session Check In - 04/06/17 1212      Check-In   Location [facility]   Staff Present Su Hilt, MS, ACSM RCEP, Exercise Physiologist;Jerrilyn Messinger Ysidro Evert, RN;Portia Rollene Rotunda, RN, BSN   Supervising physician immediately available to respond to emergencies [facility] immediately available   Physician(s) Dr. Allyson Sabal   Medication changes reported     No   Fall or balance concerns reported    No   Tobacco Cessation No Change   Warm-up and Cool-down Performed as group-led instruction   Resistance Training Performed Yes   VAD Patient? No     Pain Assessment   Currently in Pain? No/denies   Multiple Pain Sites No      Capillary Blood Glucose: No results found for this or any previous visit (from the past 24 hour(s)).      Exercise Prescription Changes - 04/06/17 1200      Response to Exercise   Blood Pressure (Admit) 92/52   Blood Pressure (Exercise) 132/62   Blood Pressure (Exit) 98/64   Heart Rate (Admit) 66 bpm   Heart Rate (Exercise) 92 bpm   Heart Rate (Exit) 59 bpm   Oxygen Saturation (Admit) 96 %   Oxygen Saturation (Exercise) 87 %   Oxygen Saturation (Exit) 93 %   Rating of Perceived Exertion (Exercise) 12   Perceived Dyspnea (Exercise) 1   Duration Continue with 45 min of aerobic exercise without signs/symptoms of physical distress.   Intensity THRR unchanged     Progression   Progression Continue to progress workloads to maintain intensity without signs/symptoms of physical distress.     Resistance Training   Training Prescription Yes   Weight orange bands   Reps 10-15   Time 10 Minutes     Oxygen   Oxygen Continuous   Liters 8     Treadmill    MPH 2.3   Grade 2   Minutes 17     Bike   Level 0.6   Minutes 17     NuStep   Level 2   Minutes 17   METs 1.8      History  Smoking Status  . Former Smoker  . Packs/day: 1.00  . Years: 18.00  . Quit date: 09/27/1985  Smokeless Tobacco  . Never Used    Goals Met:  Exercise tolerated well No report of cardiac concerns or symptoms Strength training completed today  Goals Unmet:  Not Applicable  Comments: Service time is from 1030 to 1205    Dr. Rush Farmer is Medical Director for Pulmonary Rehab at [facility].

## 2017-04-08 ENCOUNTER — Encounter (HOSPITAL_COMMUNITY)
Admission: RE | Admit: 2017-04-08 | Discharge: 2017-04-08 | Disposition: A | Discharge status: Home / Self Care | Payer: Medicare Other | Source: Ambulatory Visit | Attending: Internal Medicine | Admitting: Internal Medicine

## 2017-04-08 VITALS — Wt 176.8 lb

## 2017-04-08 DIAGNOSIS — I272 Pulmonary hypertension, unspecified: Secondary | ICD-10-CM | POA: Diagnosis not present

## 2017-04-08 NOTE — Progress Notes (Signed)
Daily Session Note  Patient Details  Name: [name] MRN: [id number] Date of Birth: 10-02-1945 Referring Provider:     Pulmonary Rehab Walk Test from 03/23/2017 in [place]  Referring Provider  Dr. Haroldine Laws      Encounter Date: 04/08/2017  Check In:     Session Check In - 04/08/17 1051      Check-In   Location [facility]   Staff Present Su Hilt, MS, ACSM RCEP, Exercise Physiologist;Tamie Minteer Ysidro Evert, RN;Portia Rollene Rotunda, RN, BSN   Supervising physician immediately available to respond to emergencies [facility] immediately available   Physician(s) Dr. Clementeen Graham   Medication changes reported     No   Fall or balance concerns reported    No   Tobacco Cessation No Change   Warm-up and Cool-down Performed as group-led instruction   Resistance Training Performed Yes   VAD Patient? No     Pain Assessment   Currently in Pain? No/denies   Multiple Pain Sites No      Capillary Blood Glucose: No results found for this or any previous visit (from the past 24 hour(s)).      Exercise Prescription Changes - 04/08/17 1200      Response to Exercise   Blood Pressure (Admit) 112/66   Blood Pressure (Exercise) 120/58   Blood Pressure (Exit) 105/69   Heart Rate (Admit) 60 bpm   Heart Rate (Exercise) 85 bpm   Heart Rate (Exit) 61 bpm   Oxygen Saturation (Admit) 92 %   Oxygen Saturation (Exercise) 86 %   Oxygen Saturation (Exit) 93 %   Rating of Perceived Exertion (Exercise) 11   Perceived Dyspnea (Exercise) 1   Duration Continue with 45 min of aerobic exercise without signs/symptoms of physical distress.   Intensity THRR unchanged     Progression   Progression Continue to progress workloads to maintain intensity without signs/symptoms of physical distress.     Resistance Training   Training Prescription Yes   Weight orange bands   Reps 10-15   Time 10 Minutes     Oxygen   Oxygen Continuous   Liters 8-10     Bike   Level 0.6   Minutes 17     NuStep   Level 2   Minutes 17   METs 1.9      History  Smoking Status  . Former Smoker  . Packs/day: 1.00  . Years: 18.00  . Quit date: 09/27/1985  Smokeless Tobacco  . Never Used    Goals Met:  Exercise tolerated well No report of cardiac concerns or symptoms Strength training completed today  Goals Unmet:  Not Applicable  Comments: Service time is from 1030 to 1240     Dr. Rush Farmer is Medical Director for Pulmonary Rehab at [facility].

## 2017-04-13 ENCOUNTER — Encounter (HOSPITAL_COMMUNITY)
Admission: RE | Admit: 2017-04-13 | Discharge: 2017-04-13 | Disposition: A | Discharge status: Home / Self Care | Payer: Medicare Other | Source: Ambulatory Visit | Attending: Internal Medicine | Admitting: Internal Medicine

## 2017-04-13 VITALS — Wt 177.2 lb

## 2017-04-13 DIAGNOSIS — I272 Pulmonary hypertension, unspecified: Secondary | ICD-10-CM

## 2017-04-13 NOTE — Progress Notes (Signed)
Daily Session Note  Patient Details  Name: [name] MRN: [id number] Date of Birth: Dec 13, 1945 Referring Provider:     Pulmonary Rehab Walk Test from 03/23/2017 in [place]  Referring Provider  Dr. Haroldine Laws      Encounter Date: 04/13/2017  Check In:     Session Check In - 04/13/17 1011      Check-In   Location [facility]   Staff Present Rodney Langton, RN;Molly diVincenzo, MS, ACSM RCEP, Exercise Physiologist   Supervising physician immediately available to respond to emergencies [facility] immediately available   Physician(s) Dr. Clementeen Graham   Medication changes reported     No   Fall or balance concerns reported    No   Tobacco Cessation No Change   Warm-up and Cool-down Performed as group-led instruction   Resistance Training Performed Yes   VAD Patient? No     Pain Assessment   Currently in Pain? No/denies   Multiple Pain Sites No      Capillary Blood Glucose: No results found for this or any previous visit (from the past 24 hour(s)).      Exercise Prescription Changes - 04/13/17 1200      Response to Exercise   Blood Pressure (Admit) 100/58   Blood Pressure (Exercise) 144/60   Blood Pressure (Exit) 104/60   Heart Rate (Admit) 59 bpm   Heart Rate (Exercise) 87 bpm   Heart Rate (Exit) 60 bpm   Oxygen Saturation (Admit) 96 %   Oxygen Saturation (Exercise) 85 %  with forehead probe   Oxygen Saturation (Exit) 99 %   Rating of Perceived Exertion (Exercise) 12   Perceived Dyspnea (Exercise) 0   Duration Continue with 45 min of aerobic exercise without signs/symptoms of physical distress.   Intensity THRR unchanged     Progression   Progression Continue to progress workloads to maintain intensity without signs/symptoms of physical distress.     Resistance Training   Training Prescription Yes   Weight orange bands   Reps 10-15   Time 10 Minutes     Oxygen   Oxygen Continuous   Liters 8-10     Treadmill   MPH 2.2   Grade 0   Minutes 17     Bike   Level 0.8   Minutes 17     NuStep   Level 3   Minutes 17   METs 1.7      History  Smoking Status  . Former Smoker  . Packs/day: 1.00  . Years: 18.00  . Quit date: 09/27/1985  Smokeless Tobacco  . Never Used    Goals Met:  Exercise tolerated well No report of cardiac concerns or symptoms Strength training completed today  Goals Unmet:  Not Applicable  Comments: Service time is from 1030 to 1210    Dr. Rush Farmer is Medical Director for Pulmonary Rehab at [facility].

## 2017-04-15 ENCOUNTER — Encounter (HOSPITAL_COMMUNITY)
Admission: RE | Admit: 2017-04-15 | Discharge: 2017-04-15 | Disposition: A | Discharge status: Home / Self Care | Payer: Medicare Other | Source: Ambulatory Visit | Attending: Internal Medicine | Admitting: Internal Medicine

## 2017-04-15 VITALS — Wt 177.2 lb

## 2017-04-15 DIAGNOSIS — I272 Pulmonary hypertension, unspecified: Secondary | ICD-10-CM

## 2017-04-15 NOTE — Progress Notes (Signed)
Daily Session Note  Patient Details  Name: [name] MRN: [id number] Date of Birth: [birth year] Referring Provider:     Pulmonary Rehab Walk Test from 03/23/2017 in [place]  Referring Provider  Dr. Haroldine Laws      Encounter Date: 04/15/2017  Check In:     Session Check In - 04/15/17 1022      Check-In   Location [facility]   Staff Present Rodney Langton, RN;Molly diVincenzo, MS, ACSM RCEP, Exercise Physiologist;Alga Southall Rollene Rotunda, RN, BSN   Supervising physician immediately available to respond to emergencies [facility] immediately available   Physician(s) Dr. Justus Memory   Medication changes reported     No   Fall or balance concerns reported    No   Tobacco Cessation No Change   Warm-up and Cool-down Performed as group-led instruction   Resistance Training Performed Yes   VAD Patient? No     Pain Assessment   Currently in Pain? No/denies   Multiple Pain Sites No      Capillary Blood Glucose: No results found for this or any previous visit (from the past 24 hour(s)).      Exercise Prescription Changes - 04/15/17 1230      Response to Exercise   Blood Pressure (Admit) 100/66   Blood Pressure (Exercise) 116/62   Blood Pressure (Exit) 98/50   Heart Rate (Admit) 60 bpm   Heart Rate (Exercise) 82 bpm   Heart Rate (Exit) 58 bpm   Oxygen Saturation (Admit) 97 %   Oxygen Saturation (Exercise) 87 %  with forehead probe   Oxygen Saturation (Exit) 95 %   Rating of Perceived Exertion (Exercise) 9   Perceived Dyspnea (Exercise) 0   Duration Continue with 45 min of aerobic exercise without signs/symptoms of physical distress.   Intensity THRR unchanged     Progression   Progression Continue to progress workloads to maintain intensity without signs/symptoms of physical distress.     Resistance Training   Training Prescription Yes   Weight orange bands   Reps 10-15   Time 10 Minutes     Oxygen   Oxygen Continuous   Liters 8-10     Treadmill   MPH 2.2   Grade 0   Minutes 17     Bike   Level 0.8   Minutes 17     NuStep   Level 3   Minutes 17   METs 1.7      History  Smoking Status  . Former Smoker  . Packs/day: 1.00  . Years: 18.00  . Quit date: 09/27/1985  Smokeless Tobacco  . Never Used    Goals Met:  Exercise tolerated well No report of cardiac concerns or symptoms Strength training completed today  Goals Unmet:  Not Applicable  Comments: Service time is from 1030 to 1210   Dr. Rush Farmer is Medical Director for Pulmonary Rehab at [facility].

## 2017-04-20 ENCOUNTER — Encounter (HOSPITAL_COMMUNITY)
Admission: RE | Admit: 2017-04-20 | Discharge: 2017-04-20 | Disposition: A | Discharge status: Home / Self Care | Payer: Medicare Other | Source: Ambulatory Visit | Attending: Internal Medicine | Admitting: Internal Medicine

## 2017-04-20 VITALS — Wt 179.5 lb

## 2017-04-20 DIAGNOSIS — I272 Pulmonary hypertension, unspecified: Secondary | ICD-10-CM | POA: Diagnosis not present

## 2017-04-20 NOTE — Progress Notes (Signed)
Daily Session Note  Patient Details  Name: [name] MRN: [id number] Date of Birth: [birth year] Referring Provider:     Pulmonary Rehab Walk Test from 03/23/2017 in [place]  Referring Provider  Dr. Haroldine Laws      Encounter Date: 04/20/2017  Check In:   Capillary Blood Glucose: No results found for this or any previous visit (from the past 24 hour(s)).      Exercise Prescription Changes - 04/20/17 1200      Response to Exercise   Blood Pressure (Admit) 100/60   Blood Pressure (Exercise) 106/60   Blood Pressure (Exit) 104/70   Heart Rate (Admit) 61 bpm   Heart Rate (Exercise) 89 bpm   Heart Rate (Exit) 63 bpm   Oxygen Saturation (Admit) 99 %   Oxygen Saturation (Exercise) 76 %  Sat on TM down O2 on 6L increased to 10L. Oxymizer placed.   Oxygen Saturation (Exit) 95 %   Rating of Perceived Exertion (Exercise) 11   Perceived Dyspnea (Exercise) 1   Duration Continue with 45 min of aerobic exercise without signs/symptoms of physical distress.   Intensity THRR unchanged     Progression   Progression Continue to progress workloads to maintain intensity without signs/symptoms of physical distress.     Resistance Training   Training Prescription Yes   Weight orange bands   Reps 10-15   Time 10 Minutes     Oxygen   Oxygen Continuous   Liters 8-10     Treadmill   MPH 2.1   Grade 0   Minutes 17     Bike   Level 0.8   Minutes 17     NuStep   Level 4   Minutes 17   METs 1.8      History  Smoking Status  . Former Smoker  . Packs/day: 1.00  . Years: 18.00  . Quit date: 09/27/1985  Smokeless Tobacco  . Never Used    Goals Met:  No report of cardiac concerns or symptoms Strength training completed today  Goals Unmet:  O2 Sat  Comments: Service time is from 1030 to 1200    Dr. Rush Farmer is Medical Director for Pulmonary Rehab at [facility].

## 2017-04-21 ENCOUNTER — Ambulatory Visit (INDEPENDENT_AMBULATORY_CARE_PROVIDER_SITE_OTHER): Payer: Medicare Other | Admitting: Neurology

## 2017-04-21 ENCOUNTER — Encounter: Payer: Self-pay | Admitting: Neurology

## 2017-04-21 VITALS — BP 93/58 | HR 64 | Ht 72.0 in | Wt 177.5 lb

## 2017-04-21 DIAGNOSIS — J439 Emphysema, unspecified: Secondary | ICD-10-CM | POA: Diagnosis not present

## 2017-04-21 DIAGNOSIS — R0683 Snoring: Secondary | ICD-10-CM

## 2017-04-21 DIAGNOSIS — I272 Pulmonary hypertension, unspecified: Secondary | ICD-10-CM | POA: Diagnosis not present

## 2017-04-21 DIAGNOSIS — Z9981 Dependence on supplemental oxygen: Secondary | ICD-10-CM | POA: Diagnosis not present

## 2017-04-21 NOTE — Progress Notes (Signed)
Subjective:    Patient ID: [name] is a 71 y.o. male.  HPI     Star Age, MD, PhD [facility] [address], [address] [place], [place] [address]  Dear Dr. Lake Bells,   I saw your patient, [name], upon your kind request in my neurologic clinic today for initial consultation of his sleep disorder, in particular, concern for underlying obstructive sleep apnea. The patient is unaccompanied today. As you know, [name] is a 71 year old right-handed gentleman with an underlying medical history of coronary artery disease, COPD/emphysema with chronic oxygen dependence, anxiety disorder, hyperlipidemia, prior smoking, and history of Raynaud's, who reports Some snoring and oxygen desaturations during sleep. He has been on oxygen 24-7 for the past 2 months or so. I reviewed your office note from 02/12/2017. He had significant desaturations with walking, requiring 6 L/m of supplemental oxygen. This was with the forehead oximetry rather than pulse oximetry secondary to his history of Raynauds. You are specifically requesting a sleep study with forehead probe to monitor his nocturnal oxygen saturations. His Epworth sleepiness score is 1 out of 24, fatigue score is 17 out of 63. He had a tonsillectomy as a child. He has had increase in stress in the past few months. He has been taking clonazepam at night for sleep as well as Benadryl or similar p.m. type medication. He is on O2 at night at 2 lpm. He has had Raynaud's for years. He has lost weight since this year. He planned to lose weight after his retirement in 2009. He worked at the [employer] for over 20 years. He quit smoking over 30 years ago. He does not drink alcohol on a regular basis, none in the past 3 months, caffeine in the form of coffee, one or 2 cups per day, typically no daily sodas or tea. Bedtime typically between 9:30 and 10, likes to read in bed, some TV watching but does not have to rely on  it and turns it off before falling asleep. Wakeup time between 6:45 AM and 7 AM typically. He denies telltale restless leg symptoms, has occasional leg cramps at night. No morning headaches typically. He has been in pulmonary rehabilitation for the past 4 weeks or so. He does not have a family history of OSA. He has nocturia about once per average night.  His Past Medical History Is Significant For: Past Medical History:  Diagnosis Date  . CAD (coronary artery disease)   . GAD (generalized anxiety disorder)   . Hyperlipidemia   . Overweight(278.02)   . Pulmonary hypertension ([place])     His Past Surgical History Is Significant For: Past Surgical History:  Procedure Laterality Date  . ANAL FISSURE REPAIR    . CARDIAC CATHETERIZATION  2005  . RIGHT/LEFT HEART CATH AND CORONARY ANGIOGRAPHY N/A 11/24/2016   Procedure: Right/Left Heart Cath and Coronary Angiography;  Surgeon: Jettie Booze, MD;  Location: [facility];  Service: Cardiovascular;  Laterality: N/A;  . SKIN CANCER EXCISION    . TONSILLECTOMY  1955  . UMBILICAL HERNIA REPAIR  10/07/2011   Procedure: HERNIA REPAIR UMBILICAL ADULT;  Surgeon: Rolm Bookbinder, MD;  Location: [facility];  Service: General;  Laterality: N/A;  with mesh     His Family History Is Significant For: Family History  Problem Relation Age of Onset  . Cancer Sister        breast  . Heart disease Father   . Heart attack Father   .  Stroke Father     His Social History Is Significant For: Social History   Social History  . Marital status: Married    Spouse name: N/A  . Number of children: N/A  . Years of education: N/A   Social History Main Topics  . Smoking status: Former Smoker    Packs/day: 1.00    Years: 18.00    Quit date: 09/27/1985  . Smokeless tobacco: Never Used  . Alcohol use Yes  . Drug use: No  . Sexual activity: Not Asked   Other Topics Concern  . None   Social History Narrative  . None    His Allergies Are:   Allergies  Allergen Reactions  . Penicillins Hives    Has patient had a PCN reaction causing immediate rash, facial/tongue/throat swelling, SOB or lightheadedness with hypotension: No Has patient had a PCN reaction causing severe rash involving mucus membranes or skin necrosis: Yes Has patient had a PCN reaction that required hospitalization Unknown Has patient had a PCN reaction occurring within the last 10 years: No If all of the above answers are "NO", then may proceed with Cephalosporin use.   . Symbicort [Budesonide-Formoterol Fumarate]     Severe cough  :   His Current Medications Are:  Outpatient Encounter Prescriptions as of 04/21/2017  Medication Sig  . aspirin 81 MG tablet Take 81 mg by mouth every evening.   . clonazePAM (KLONOPIN) 0.5 MG tablet Take 0.5 mg by mouth 2 (two) times daily as needed for anxiety.   . Coenzyme Q10 (VITALINE COQ10) 60 MG TABS Take 60 mg by mouth daily.  . diphenhydrAMINE (BENADRYL) 12.5 MG/5ML liquid Take 25 mg by mouth at bedtime as needed for sleep.  Marland Kitchen ezetimibe (ZETIA) 10 MG tablet Take 1 tablet (10 mg total) by mouth every evening.  Marland Kitchen ibuprofen (ADVIL,MOTRIN) 200 MG tablet Take 400 mg by mouth 2 (two) times daily as needed for headache or moderate pain.  . Lactobacillus (ACIDOPHILUS PO) Take 1 capsule by mouth daily.   . Macitentan (OPSUMIT) 10 MG TABS Take 1 tablet (10 mg total) by mouth daily.  . Multiple Vitamins-Minerals (CENTRUM CARDIO PO) Take 1 tablet by mouth daily.   . Omega-3 Fatty Acids (OMEGA 3 PO) Take 3,200 mg by mouth 2 (two) times daily. Nordic Natural ultimate Omega 3   . rosuvastatin (CRESTOR) 40 MG tablet Take 1 tablet (40 mg total) by mouth daily.  Marland Kitchen umeclidinium-vilanterol (ANORO ELLIPTA) 62.5-25 MCG/INH AEPB Inhale 1 puff into the lungs daily.  . vitamin B-12 (CYANOCOBALAMIN) 1000 MCG tablet Take 1,000 mcg by mouth daily.   No facility-administered encounter medications on file as of 04/21/2017.   :  Review of Systems:   Out of a complete 14 point review of systems, all are reviewed and negative with the exception of these symptoms as listed below: Review of Systems  Respiratory: Positive for shortness of breath.   Genitourinary:       Impotence  Neurological: Negative.   Psychiatric/Behavioral:       Anxiety    Objective:  Neurological Exam  Physical Exam Physical Examination:   Vitals:   04/21/17 1037  BP: (!) 93/58  Pulse: 64    General Examination: The patient is a very pleasant 71 y.o. male in no acute distress. He appears mildly deconditioned. On O2 via tank.  HEENT: Normocephalic, atraumatic, pupils are equal, round and reactive to light and accommodation. Funduscopic exam is normal with sharp disc margins noted. Extraocular tracking is good without  limitation to gaze excursion or nystagmus noted. Normal smooth pursuit is noted. Hearing is grossly intact. Tympanic membranes are clear bilaterally. Face is symmetric with normal facial animation and normal facial sensation. Speech is clear with no dysarthria noted. There is no hypophonia. There is no lip, neck/head, jaw or voice tremor. Neck is supple with full range of passive and active motion. There are no carotid bruits on auscultation. Oropharynx exam reveals: mild mouth dryness, adequate dental hygiene and mild airway crowding, due to Smaller airway entry and redundant soft palate, longer appearing tongue. Tonsils are absent. Mallampati is class I. Neck circumference is 15-5/8 inches. Tongue protrudes centrally and palate elevates symmetrically.   Chest: Clear to auscultation without wheezing, rhonchi or crackles noted.  Heart: S1+S2+0, regular and normal without murmurs, rubs or gallops noted.   Abdomen: Soft, non-tender and non-distended with normal bowel sounds appreciated on auscultation.  Extremities: There is no pitting edema in the distal lower extremities bilaterally. Pedal pulses are intact.  Skin: Warm and dry without trophic  changes noted. There are varicose veins on the right more than left lower extremity   Musculoskeletal: exam reveals no obvious joint deformities, tenderness or joint swelling or erythema.   Neurologically:  Mental status: The patient is awake, alert and oriented in all 4 spheres. His immediate and remote memory, attention, language skills and fund of knowledge are appropriate. There is no evidence of aphasia, agnosia, apraxia or anomia. Speech is clear with normal prosody and enunciation. Thought process is linear. Mood is normal and affect is normal.  Cranial nerves II - XII are as described above under HEENT exam. In addition: shoulder shrug is normal with equal shoulder height noted. Motor exam: Normal bulk, strength and tone is noted. There is no drift, tremor or rebound. Romberg is negative. Reflexes are 1+. Fine motor skills and coordination: grossly intact. There is no truncal or gait ataxia.  Sensory exam: intact to light touch in the upper and lower extremities.  Gait, station and balance: He stands easily. No veering to one side is noted. No leaning to one side is noted. Posture is age-appropriate and stance is narrow based. Gait shows normal stride length and normal pace. No problems turning are noted.   Assessment and Plan:    In summary, [name] is a very pleasant 71 y.o.-year old male with an underlying medical history of coronary artery disease, COPD/emphysema with chronic oxygen dependence, anxiety disorder, hyperlipidemia, prior smoking, and history of Raynaud's, who presents for sleep evaluation. While he does not give a telltale history of OSA, given his history of nocturnal oxygen desaturations, pulmonary hypertension, and history of snoring, I agree with you that OSA should be ruled out.  I had a long chat with the patient about my findings and the diagnosis of OSA, its prognosis and treatment options. We talked about medical treatments, surgical interventions and  non-pharmacological approaches. I explained in particular the risks and ramifications of untreated moderate to severe OSA, especially with respect to developing cardiovascular disease down the Road, including congestive heart failure, difficult to treat hypertension, cardiac arrhythmias, or stroke. Even type 2 diabetes has, in part, been linked to untreated OSA. Symptoms of untreated OSA include daytime sleepiness, memory problems, mood irritability and mood disorder such as depression and anxiety, lack of energy, as well as recurrent headaches, especially morning headaches. I recommended the following at this time: sleep study with potential positive airway pressure titration. (We will score hypopneas at 4% and  we will  start the study without supplemental oxygen so not to mask any hypopneas. We may have to titrate his oxygen. We will get her forehead O2 probe due to his history of Raynaud's.   I explained the sleep test procedure to the patient and also outlined possible surgical and non-surgical treatment options of OSA, including the use of a custom-made dental device (which would require a referral to a specialist dentist or oral surgeon), upper airway surgical options, such as pillar implants, radiofrequency surgery, tongue base surgery, and UPPP (which would involve a referral to an ENT surgeon). Rarely, jaw surgery such as mandibular advancement may be considered.  I also explained the CPAP treatment option to the patient, who indicated that he would be willing to try CPAP if the need arises. I explained the importance of being compliant with PAP treatment, not only for insurance purposes but primarily to improve His symptoms, and for the patient's long term health benefit, including to reduce His cardiovascular risks. I answered all his questions today and the patient was in agreement. I would like to see him back after the sleep study is completed and encouraged him to call with any interim questions,  concerns, problems or updates.   Thank you very much for allowing me to participate in the care of this nice patient. If I can be of any further assistance to you please do not hesitate to call me at [phone].  Sincerely,   Star Age, MD, PhD

## 2017-04-21 NOTE — Patient Instructions (Addendum)
We will look into your sleep with a sleep study to determine whether you do or do not have OSA and how severe it is.   Our sleep lab administrative assistant, Arrie Aran will meet with you or call you to schedule your sleep study. If you don't hear back from her by about 2 weeks from now, please feel free to call her at [phone]. This is her direct line and please leave a message with your phone number to call back if you get the voicemail box. She will call back as soon as possible.

## 2017-04-22 ENCOUNTER — Encounter (HOSPITAL_COMMUNITY)
Admission: RE | Admit: 2017-04-22 | Discharge: 2017-04-22 | Disposition: A | Discharge status: Home / Self Care | Payer: Medicare Other | Source: Ambulatory Visit | Attending: Internal Medicine | Admitting: Internal Medicine

## 2017-04-22 VITALS — Wt 178.6 lb

## 2017-04-22 DIAGNOSIS — I272 Pulmonary hypertension, unspecified: Secondary | ICD-10-CM | POA: Diagnosis not present

## 2017-04-22 NOTE — Progress Notes (Addendum)
Daily Session Note  Patient Details  Name: [name] MRN: [id number] Date of Birth: 1946/02/05 Referring Provider:     Pulmonary Rehab Walk Test from 03/23/2017 in [facility]  Referring Provider  Dr. Haroldine Laws      Encounter Date: 04/22/2017  Check In:     Session Check In - 04/22/17 1030      Check-In   Location [facility]   Staff Present Su Hilt, MS, ACSM RCEP, Exercise Physiologist;Lisa Ysidro Evert, RN;Other;Kahlel Peake Rollene Rotunda, RN, BSN   Supervising physician immediately available to respond to emergencies [facility] immediately available   Physician(s) Dr. Clementeen Graham   Medication changes reported     No   Fall or balance concerns reported    No   Tobacco Cessation No Change   Warm-up and Cool-down Performed as group-led instruction   Resistance Training Performed Yes   VAD Patient? No     Pain Assessment   Currently in Pain? No/denies   Multiple Pain Sites No      Capillary Blood Glucose: No results found for this or any previous visit (from the past 24 hour(s)).    History  Smoking Status  . Former Smoker  . Packs/day: 1.00  . Years: 18.00  . Quit date: 09/27/1985  Smokeless Tobacco  . Never Used    Goals Met:  No report of cardiac concerns or symptoms Strength training completed today  Goals Unmet:  O2 Sat  Comments: Service time is from 1030 to 1230   Dr. Rush Farmer is Medical Director for Pulmonary Rehab at [facility].

## 2017-04-26 NOTE — Progress Notes (Signed)
Atlas [place] Platts 71 y.o. male  DOB: March 13, 1946 MRN: [id number]           Nutrition Brief Note 1. Pulmonary hypertension ([place])    Past Medical History:  Diagnosis Date  . CAD (coronary artery disease)   . GAD (generalized anxiety disorder)   . Hyperlipidemia   . Overweight(278.02)   . Pulmonary hypertension ([place])    Meds reviewed.   Ht: Ht Readings from Last 1 Encounters:  04/21/17 6' (1.829 m)    Wt:  Wt Readings from Last 3 Encounters:  04/22/17 178 lb 9.2 oz (81 kg)  04/21/17 177 lb 8 oz (80.5 kg)  04/20/17 179 lb 7.3 oz (81.4 kg)    BMI: 24.0    Current tobacco use? No  Labs:  No results found for: HGBA1C  Nutrition Diagnosis ? Food-and nutrition-related knowledge deficit related to lack of exposure to information as related to diagnosis of pulmonary disease Brief Note: Pt reports 9 lb wt loss over the past 3 months. Per EMR, pt wt is down 12 lb over the past 7 months (6.3% decrease in body wt). Pt appears to have had slow wt loss over the past 3 years. Pt's current wt is down 36 lb over the past 3 years (17% decrease in body wt). Will discuss slow wt loss with pt.   Goal(s) 1. Describe the benefit of including fruits, vegetables, whole grains, and low-fat dairy products in a healthy meal plan. Plan:  Pt to attend Pulmonary Nutrition class - met 04/22/17 Will provide client-centered nutrition education as part of interdisciplinary care.   Monitor and evaluate progress toward nutrition goal with team.  Monitor and Evaluate progress toward nutrition goal with team.   Derek Mound, M.Ed, RD, LDN, CDE 04/26/2017 2:39 PM

## 2017-04-27 ENCOUNTER — Telehealth: Payer: Self-pay | Admitting: Acute Care

## 2017-04-27 ENCOUNTER — Telehealth: Payer: Self-pay

## 2017-04-27 ENCOUNTER — Encounter (HOSPITAL_COMMUNITY)
Admission: RE | Admit: 2017-04-27 | Discharge: 2017-04-27 | Disposition: A | Discharge status: Home / Self Care | Payer: Medicare Other | Source: Ambulatory Visit | Attending: Internal Medicine | Admitting: Internal Medicine

## 2017-04-27 VITALS — Wt 177.9 lb

## 2017-04-27 DIAGNOSIS — I272 Pulmonary hypertension, unspecified: Secondary | ICD-10-CM | POA: Diagnosis not present

## 2017-04-27 NOTE — Progress Notes (Addendum)
Daily Session Note  Patient Details  Name: [name] MRN: [id number] Date of Birth: 05/31/1946 Referring Provider:     Pulmonary Rehab Walk Test from 03/23/2017 in [facility]  Referring Provider  Dr. Haroldine Laws      Encounter Date: 04/27/2017  Check In:     Session Check In - 04/27/17 1159      Check-In   Location [facility]   Staff Present Su Hilt, MS, ACSM RCEP, Exercise Physiologist;Other;Sarthak Rubenstein Rollene Rotunda, RN, BSN   Supervising physician immediately available to respond to emergencies [facility] immediately available   Physician(s) Dr. Clementeen Graham   Medication changes reported     No   Fall or balance concerns reported    No   Tobacco Cessation No Change   Warm-up and Cool-down Performed as group-led instruction   Resistance Training Performed Yes   VAD Patient? No     Pain Assessment   Currently in Pain? No/denies   Multiple Pain Sites No      Capillary Blood Glucose: No results found for this or any previous visit (from the past 24 hour(s)).      Exercise Prescription Changes - 04/27/17 1231      Response to Exercise   Blood Pressure (Admit) 98/62   Blood Pressure (Exercise) 144/68   Blood Pressure (Exit) 100/66   Heart Rate (Admit) 55 bpm   Heart Rate (Exercise) 111 bpm   Heart Rate (Exit) 68 bpm   Oxygen Saturation (Admit) 98 %   Oxygen Saturation (Exercise) 86 %  15 L   Oxygen Saturation (Exit) 92 %   Rating of Perceived Exertion (Exercise) 13   Perceived Dyspnea (Exercise) 2   Duration Continue with 45 min of aerobic exercise without signs/symptoms of physical distress.   Intensity THRR unchanged     Progression   Progression Continue to progress workloads to maintain intensity without signs/symptoms of physical distress.     Resistance Training   Training Prescription Yes   Weight orange bands   Reps 10-15   Time 10 Minutes     Oxygen   Oxygen Continuous   Liters 15     Treadmill   MPH 2.4   Grade 3   Minutes 17     Bike   Level 1.4   Minutes 17     NuStep   Level 4   Minutes 17   METs 2.8      History  Smoking Status  . Former Smoker  . Packs/day: 1.00  . Years: 18.00  . Quit date: 09/27/1985  Smokeless Tobacco  . Never Used    Goals Met:  Using PLB without cueing & demonstrates good technique Exercise tolerated well No report of cardiac concerns or symptoms Strength training completed today  Goals Unmet:  Saturations remained above 85 on 15L as ordered by Dr. Lake Bells  Comments: Service time is from 1030 to [place]   Dr. Rush Farmer is Medical Director for Pulmonary Rehab at [facility].

## 2017-04-27 NOTE — Telephone Encounter (Signed)
I spoke with patient earlier regarding this. Will close this message.

## 2017-04-27 NOTE — Telephone Encounter (Signed)
Spoke with patient. He wanted to know why he needed an appt with SG tomorrow afternoon. I explained to him the reasoning why. Advised him that he would receive more information tomorrow during his OV. He stated that Truddie Crumble had told him that BQ had approved the increase in workload and that his stats were staying well above 90%. Advised pt that based on the information we have, his oxygen levels were staying below 88%.

## 2017-04-27 NOTE — Telephone Encounter (Signed)
Patient is scheduled with SG on 04/28/2017 at 2:45. Patient request to speak with nurse regarding appointment.

## 2017-04-27 NOTE — Telephone Encounter (Signed)
Spoke with [employer]. She just wanted to make sure that BQ was aware of what was really going on with the patient. I advised her that I had explained to the patient why he needed to be seen. He stated that he has been doing great in rehab and that his O2 has been in the middle to upper 90s. Truddie Crumble stated that this is not true. This information can be verified via the pulm rehab notes.

## 2017-04-27 NOTE — Telephone Encounter (Signed)
lmtcb X1 for pt - needs to be scheduled ASAP with any NP or BQ.

## 2017-04-27 NOTE — Telephone Encounter (Signed)
-----  Message from Juanito Doom, MD sent at 04/26/2017  6:37 PM EDT ----- Regarding: FW: oxygen requirements for rehab A, This guy needs to see one of [place] in clinic ASAP for severe oxygen desaturation.  Please arrange a visit with me or an NP ASAP. Thanks, B ----- Message ----- From: Benedetto Goad, RN Sent: 04/15/2017   7:14 AM To: Jolaine Artist, MD, Juanito Doom, MD, # Subject: oxygen requirements for rehab                  Hi. I would like to make you aware of the above patients oxygen requirements and ask for recommendations for his rehab exercise  prescription. Dr. Lake Bells, I know we have spoken about this previously, however I would like to make you aware of his continued desaturations as it may relate to the medical care you and Dr. Haroldine Laws provide. At his last exercise session, Mr. Keenum required 10 liters while walking on the treadmill just to keep his saturations at 85%. This was at a 2.2MPH speed with 0 incline. If you have any knowledge of walking on a treadmill you know this is not very much for him. Physically he is capable of doing so much more. We are using a forehead probe and there is perfect correlation between the pleth and his HR. Seated on an airdyne bike at 0.6 level (very slow) his sats are 88% on 10L and on the NuStep (also a seated exercise) he increases to 96% on 10L and we are able to decrease his oxygen. Unfortunately we do not have to ride a bike daily or use a NuStep but for this patient walking is a necessity for his quality of life. I can only imagine what his sats are when he exerts doing his ADLs or attempting to play golf.  The problem is we cannot increase his workloads without increasing his oxygen more or lowering the saturation parameters below 90%  What should his max liter flow be? What is our goal saturation?  Respectfully, Truddie Crumble

## 2017-04-28 ENCOUNTER — Encounter: Payer: Self-pay | Admitting: Acute Care

## 2017-04-28 ENCOUNTER — Other Ambulatory Visit (INDEPENDENT_AMBULATORY_CARE_PROVIDER_SITE_OTHER): Payer: Medicare Other

## 2017-04-28 ENCOUNTER — Ambulatory Visit (INDEPENDENT_AMBULATORY_CARE_PROVIDER_SITE_OTHER): Payer: Medicare Other | Admitting: Acute Care

## 2017-04-28 ENCOUNTER — Ambulatory Visit (INDEPENDENT_AMBULATORY_CARE_PROVIDER_SITE_OTHER)
Admission: RE | Admit: 2017-04-28 | Discharge: 2017-04-28 | Disposition: A | Discharge status: Home / Self Care | Payer: Medicare Other | Source: Ambulatory Visit | Attending: Acute Care | Admitting: Acute Care

## 2017-04-28 ENCOUNTER — Telehealth: Payer: Self-pay | Admitting: Acute Care

## 2017-04-28 VITALS — BP 104/62 | HR 65 | Ht 72.0 in | Wt 174.8 lb

## 2017-04-28 DIAGNOSIS — J9611 Chronic respiratory failure with hypoxia: Secondary | ICD-10-CM | POA: Diagnosis not present

## 2017-04-28 DIAGNOSIS — E46 Unspecified protein-calorie malnutrition: Secondary | ICD-10-CM | POA: Diagnosis not present

## 2017-04-28 DIAGNOSIS — J432 Centrilobular emphysema: Secondary | ICD-10-CM

## 2017-04-28 DIAGNOSIS — I272 Pulmonary hypertension, unspecified: Secondary | ICD-10-CM | POA: Diagnosis not present

## 2017-04-28 DIAGNOSIS — D696 Thrombocytopenia, unspecified: Secondary | ICD-10-CM | POA: Diagnosis not present

## 2017-04-28 LAB — CBC WITH DIFFERENTIAL/PLATELET
BASOS PCT: 0.5 % (ref 0.0–3.0)
Basophils Absolute: 0 10*3/uL (ref 0.0–0.1)
EOS PCT: 1.6 % (ref 0.0–5.0)
Eosinophils Absolute: 0.1 10*3/uL (ref 0.0–0.7)
HCT: 45.1 % (ref 39.0–52.0)
HEMOGLOBIN: 15.4 g/dL (ref 13.0–17.0)
Lymphocytes Relative: 17.1 % (ref 12.0–46.0)
Lymphs Abs: 1.2 10*3/uL (ref 0.7–4.0)
MCHC: 34.2 g/dL (ref 30.0–36.0)
MCV: 96.1 fl (ref 78.0–100.0)
MONOS PCT: 11.7 % (ref 3.0–12.0)
Monocytes Absolute: 0.8 10*3/uL (ref 0.1–1.0)
Neutro Abs: 4.9 10*3/uL (ref 1.4–7.7)
Neutrophils Relative %: 69.1 % (ref 43.0–77.0)
Platelets: 107 10*3/uL — ABNORMAL LOW (ref 150.0–400.0)
RBC: 4.7 Mil/uL (ref 4.22–5.81)
RDW: 14 % (ref 11.5–15.5)
WBC: 7.2 10*3/uL (ref 4.0–10.5)

## 2017-04-28 MED ORDER — FLUTICASONE-UMECLIDIN-VILANT 100-62.5-25 MCG/INH IN AEPB: 1.0000 | INHALATION_SPRAY | Freq: Every day | RESPIRATORY_TRACT | 0 refills | Status: DC

## 2017-04-28 NOTE — Assessment & Plan Note (Addendum)
We will try Trelegy  as a therapeutic trial in place of Anoro Rinse mouth after use Continue wearing oxygen at 2 Liters at rest and 5-6 L for exertion. Saturation goals are > 88%-92% at rest and with mild exertion Continue with Pulmonary Rehab as you have been doing. Oxygen saturation goals at pulmonary rehabilitation greater than 86% with exertion Follow up with Dr. Lake Bells 05/26/2017 as is scheduled Follow-up CT chest 12/18/2017 or before as follow-up of 3 mm right upper lobe solid pulmonary nodule.

## 2017-04-28 NOTE — Progress Notes (Signed)
Pulmonary Individual Treatment Plan  Patient Details  Name: [name] MRN: [id number] Date of Birth: 02/20/1946 Referring Provider:     Pulmonary Rehab Walk Test from 03/23/2017 in [place]  Referring Provider  Dr. Haroldine Laws      Initial Encounter Date:    Pulmonary Rehab Walk Test from 03/23/2017 in [place]  Date  03/25/17  Referring Provider  Dr. Haroldine Laws      Visit Diagnosis: Pulmonary hypertension ([place])  Patient's Home Medications on Admission:   Current Outpatient Prescriptions:  .  aspirin 81 MG tablet, Take 81 mg by mouth every evening. , Disp: , Rfl:  .  clonazePAM (KLONOPIN) 0.5 MG tablet, Take 0.5 mg by mouth 2 (two) times daily as needed for anxiety. , Disp: , Rfl: 0 .  Coenzyme Q10 (VITALINE COQ10) 60 MG TABS, Take 60 mg by mouth daily., Disp: , Rfl:  .  diphenhydrAMINE (BENADRYL) 12.5 MG/5ML liquid, Take 25 mg by mouth at bedtime as needed for sleep., Disp: , Rfl:  .  ezetimibe (ZETIA) 10 MG tablet, Take 1 tablet (10 mg total) by mouth every evening., Disp: 90 tablet, Rfl: 3 .  Fluticasone-Umeclidin-Vilant (TRELEGY ELLIPTA) 100-62.5-25 MCG/INH AEPB, Inhale 1 puff into the lungs daily., Disp: 1 each, Rfl: 0 .  ibuprofen (ADVIL,MOTRIN) 200 MG tablet, Take 400 mg by mouth 2 (two) times daily as needed for headache or moderate pain., Disp: , Rfl:  .  Lactobacillus (ACIDOPHILUS PO), Take 1 capsule by mouth daily. , Disp: , Rfl:  .  Macitentan (OPSUMIT) 10 MG TABS, Take 1 tablet (10 mg total) by mouth daily., Disp: 30 tablet, Rfl: 11 .  Multiple Vitamins-Minerals (CENTRUM CARDIO PO), Take 1 tablet by mouth daily. , Disp: , Rfl:  .  Omega-3 Fatty Acids (OMEGA 3 PO), Take 3,200 mg by mouth 2 (two) times daily. Nordic Natural ultimate Omega 3 , Disp: , Rfl:  .  rosuvastatin (CRESTOR) 40 MG tablet, Take 1 tablet (40 mg total) by mouth daily., Disp: 90 tablet, Rfl: 3 .  umeclidinium-vilanterol (ANORO ELLIPTA)  62.5-25 MCG/INH AEPB, Inhale 1 puff into the lungs daily., Disp: 60 each, Rfl: 5 .  vitamin B-12 (CYANOCOBALAMIN) 1000 MCG tablet, Take 1,000 mcg by mouth daily., Disp: , Rfl:   Past Medical History: Past Medical History:  Diagnosis Date  . CAD (coronary artery disease)   . GAD (generalized anxiety disorder)   . Hyperlipidemia   . Overweight(278.02)   . Pulmonary hypertension ([facility])     Tobacco Use: History  Smoking Status  . Former Smoker  . Packs/day: 1.00  . Years: 18.00  . Quit date: 09/27/1985  Smokeless Tobacco  . Never Used    Labs: Recent Review Flowsheet Data    Labs for ITP Cardiac and Pulmonary Rehab Latest Ref Rng & Units 11/25/2015 06/12/2016 11/24/2016 11/24/2016 12/02/2016   Cholestrol <200 mg/dL 118(L) 101 - - -   LDLCALC <100 mg/dL 50 47 - - -   HDL >40 mg/dL 37(L) 37(L) - - -   Trlycerides <150 mg/dL 155(H) 86 - - -   PHART 7.350 - 7.450 - - 7.416 - -   PCO2ART 32.0 - 48.0 mmHg - - 32.2 - -   HCO3 20.0 - 28.0 mmol/L - - 20.7 21.9 -   TCO2 0 - 100 mmol/L - - _0 ACIDBASEDEF 0.0 - 2.0 mmol/L - - 3.0(H) 2.0 -   O2SAT % - - 90.0 67.0 -  Capillary Blood Glucose: No results found for: GLUCAP   Pulmonary Assessment Scores:     Pulmonary Assessment Scores    Row Name 03/25/17 0700 03/31/17 0825       ADL UCSD   ADL Phase Entry Entry    SOB Score total  - 35      CAT Score   CAT Score  - 10  Entry      mMRC Score   mMRC Score 2  -       Pulmonary Function Assessment:   Exercise Target Goals:    Exercise Program Goal: Individual exercise prescription set with THRR, safety & activity barriers. Participant demonstrates ability to understand and report RPE using BORG scale, to self-measure pulse accurately, and to acknowledge the importance of the exercise prescription.  Exercise Prescription Goal: Starting with aerobic activity 30 plus minutes a day, 3 days per week for initial exercise prescription. Provide home exercise  prescription and guidelines that participant acknowledges understanding prior to discharge.  Activity Barriers & Risk Stratification:   6 Minute Walk:     6 Minute Walk    Row Name 03/23/17 1712         6 Minute Walk   Phase Initial     Distance 853 feet     Walk Time 5.45 minutes     # of Rest Breaks 1     MPH 1.78     METS 1.43     RPE 11     Perceived Dyspnea  1     VO2 Peak 5.01     Symptoms No     Resting HR 64 bpm     Resting BP 110/70     Max Ex. HR 73 bpm     Max Ex. BP 106/70     2 Minute Post BP 98/64       Interval HR   Baseline HR (retired) 92     1 Minute HR 71     2 Minute HR 68     3 Minute HR 54     4 Minute HR 60     5 Minute HR 73     6 Minute HR 71     2 Minute Post HR 55     Interval Heart Rate? Yes       Interval Oxygen   Interval Oxygen? Yes     Baseline Oxygen Saturation % 92 %     Resting Liters of Oxygen 2 L     1 Minute Oxygen Saturation % 85 %  increased to 87      1 Minute Liters of Oxygen 2 L     2 Minute Oxygen Saturation % 86 %  short rest break 15 seconds, incr. O2-->91%     2 Minute Liters of Oxygen 4 L     3 Minute Oxygen Saturation % 92 %     3 Minute Liters of Oxygen 6 L     4 Minute Oxygen Saturation % 91 %     4 Minute Liters of Oxygen 6 L     5 Minute Oxygen Saturation % 90 %     5 Minute Liters of Oxygen 6 L     6 Minute Oxygen Saturation % 90 %     6 Minute Liters of Oxygen 6 L     2 Minute Post Oxygen Saturation % 96 %     2 Minute Post Liters of Oxygen 3 L  Oxygen Initial Assessment:     Oxygen Initial Assessment - 03/25/17 0700      Initial 6 min Walk   Oxygen Used Continuous;E-Tanks   Liters per minute 6   Resting Oxygen Saturation  92 %  2liters   Exercise Oxygen Saturation  during 6 min walk 85 %  85-87% 2 liters 90% on 6 liters     Program Oxygen Prescription   Program Oxygen Prescription Continuous;E-Tanks   Liters per minute 6      Oxygen Re-Evaluation:     Oxygen  Re-Evaluation    Row Name 04/28/17 1721             Program Oxygen Prescription   Program Oxygen Prescription Continuous;E-Tanks       Liters per minute 15       Comments order recieved to maintain saturations>85%         Home Oxygen   Home Oxygen Device E-Tanks;Home Concentrator       Sleep Oxygen Prescription Continuous       Liters per minute 2       Home Exercise Oxygen Prescription Continuous       Liters per minute -  home exercise not complete until it is determined that patient has oxygen source sufficient to maintain high literflow       Home at Rest Exercise Oxygen Prescription Continuous       Liters per minute 2.5       Compliance with Home Oxygen Use Yes         Goals/Expected Outcomes   Short Term Goals To learn and exhibit compliance with exercise, home and travel O2 prescription;To learn and understand importance of monitoring SPO2 with pulse oximeter and demonstrate accurate use of the pulse oximeter.;To learn and understand importance of maintaining oxygen saturations>88%;To learn and demonstrate proper pursed lip breathing techniques or other breathing techniques.;To learn and demonstrate proper use of respiratory medications       Long  Term Goals Exhibits compliance with exercise, home and travel O2 prescription;Verbalizes importance of monitoring SPO2 with pulse oximeter and return demonstration;Maintenance of O2 saturations>88%;Exhibits proper breathing techniques, such as pursed lip breathing or other method taught during program session;Compliance with respiratory medication       Goals/Expected Outcomes patient will verbalize home oxygen compliance          Oxygen Discharge (Final Oxygen Re-Evaluation):     Oxygen Re-Evaluation - 04/28/17 1721      Program Oxygen Prescription   Program Oxygen Prescription Continuous;E-Tanks   Liters per minute 15   Comments order recieved to maintain saturations>85%     Home Oxygen   Home Oxygen Device E-Tanks;Home  Concentrator   Sleep Oxygen Prescription Continuous   Liters per minute 2   Home Exercise Oxygen Prescription Continuous   Liters per minute --  home exercise not complete until it is determined that patient has oxygen source sufficient to maintain high literflow   Home at Rest Exercise Oxygen Prescription Continuous   Liters per minute 2.5   Compliance with Home Oxygen Use Yes     Goals/Expected Outcomes   Short Term Goals To learn and exhibit compliance with exercise, home and travel O2 prescription;To learn and understand importance of monitoring SPO2 with pulse oximeter and demonstrate accurate use of the pulse oximeter.;To learn and understand importance of maintaining oxygen saturations>88%;To learn and demonstrate proper pursed lip breathing techniques or other breathing techniques.;To learn and demonstrate proper use of respiratory medications   Long  Term Goals Exhibits compliance with exercise, home and travel O2 prescription;Verbalizes importance of monitoring SPO2 with pulse oximeter and return demonstration;Maintenance of O2 saturations>88%;Exhibits proper breathing techniques, such as pursed lip breathing or other method taught during program session;Compliance with respiratory medication   Goals/Expected Outcomes patient will verbalize home oxygen compliance      Initial Exercise Prescription:     Initial Exercise Prescription - 03/25/17 0700      Date of Initial Exercise RX and Referring Provider   Date 03/25/17   Referring Provider Dr. Haroldine Laws     Oxygen   Oxygen Continuous   Liters 6     Bike   Level 0.5   Minutes 17     NuStep   Level 2   Minutes 17   METs 1.5     Track   Laps 7   Minutes 17     Prescription Details   Frequency (times per week) 2   Duration Progress to 45 minutes of aerobic exercise without signs/symptoms of physical distress     Intensity   THRR 40-80% of Max Heartrate 60-119   Ratings of Perceived Exertion 11-13   Perceived  Dyspnea 0-4     Progression   Progression Continue progressive overload as per policy without signs/symptoms or physical distress.     Resistance Training   Training Prescription Yes   Weight orange bands   Reps 10-15      Perform Capillary Blood Glucose checks as needed.  Exercise Prescription Changes:     Exercise Prescription Changes    Row Name 03/30/17 1200 04/01/17 1359 04/06/17 1200 04/08/17 1200 04/13/17 1200     Response to Exercise   Blood Pressure (Admit) 118/70 100/64 92/52 112/66 100/58   Blood Pressure (Exercise) 122/70  - 132/62 120/58 144/60   Blood Pressure (Exit) 96/62 114/62 98/64 105/69 104/60   Heart Rate (Admit) 59 bpm 55 bpm 66 bpm 60 bpm 59 bpm   Heart Rate (Exercise) 86 bpm 88 bpm 92 bpm 85 bpm 87 bpm   Heart Rate (Exit) 62 bpm 53 bpm 59 bpm 61 bpm 60 bpm   Oxygen Saturation (Admit) 98 % 98 % 96 % 92 % 96 %   Oxygen Saturation (Exercise) 84 % 84 % 87 % 86 % 85 %  with forehead probe   Oxygen Saturation (Exit) 94 % 94 % 93 % 93 % 99 %   Rating of Perceived Exertion (Exercise) _0 Perceived Dyspnea (Exercise) _1 0   Duration Continue with 45 min of aerobic exercise without signs/symptoms of physical distress. Continue with 45 min of aerobic exercise without signs/symptoms of physical distress. Continue with 45 min of aerobic exercise without signs/symptoms of physical distress. Continue with 45 min of aerobic exercise without signs/symptoms of physical distress. Continue with 45 min of aerobic exercise without signs/symptoms of physical distress.   Intensity _2      Progression   Progression Continue to progress workloads to maintain intensity without signs/symptoms of physical distress. Continue to progress workloads to maintain intensity without signs/symptoms of physical distress. Continue to progress workloads to maintain intensity without signs/symptoms of physical  distress. Continue to progress workloads to maintain intensity without signs/symptoms of physical distress. Continue to progress workloads to maintain intensity without signs/symptoms of physical distress.     Resistance Training   Training Prescription _3    Weight orange bands orange  bands orange bands orange bands orange bands   Reps 10-15 10-15 10-15 10-15 10-15   Time 10 Minutes 10 Minutes 10 Minutes 10 Minutes 10 Minutes     Oxygen   Oxygen _0    Liters _1 8-10 8-10     Treadmill   MPH 1.6 2 2.3  - 2.2   Grade 0 0 2  - 0   Minutes _2 - 17     Bike   Level 0.5  - 0.6 0.6 0.8   Minutes 17  - _3 NuStep   Level 2  - _4 Minutes 17  - _5 METs 1.7  - 1.8 1.9 1.7   Row Name 04/15/17 1230 04/20/17 1200 04/22/17 1245 04/27/17 1231       Response to Exercise   Blood Pressure (Admit) 100/66 100/60 100/60 98/62    Blood Pressure (Exercise) 116/62 106/60 104/81 144/68    Blood Pressure (Exit) 98/50 104/70 112/60 100/66    Heart Rate (Admit) 60 bpm 61 bpm 57 bpm 55 bpm    Heart Rate (Exercise) 82 bpm 89 bpm 89 bpm 111 bpm    Heart Rate (Exit) 58 bpm 63 bpm 63 bpm 68 bpm    Oxygen Saturation (Admit) 97 % 99 % 98 % 98 %    Oxygen Saturation (Exercise) 87 %  with forehead probe 76 %  Sat on TM down O2 on 6L increased to 10L. Oxymizer placed. 83 %  Sat on TM down O2 on 6L increased to 10L. Oxymizer placed. 86 %  15 L    Oxygen Saturation (Exit) 95 % 95 % 95 % 92 %    Rating of Perceived Exertion (Exercise) _6 Perceived Dyspnea (Exercise) 0 _7 Duration Continue with 45 min of aerobic exercise without signs/symptoms of physical distress. Continue with 45 min of aerobic exercise without signs/symptoms of physical distress. Continue with 45 min of aerobic exercise without signs/symptoms of physical distress. Continue with 45 min of aerobic exercise without signs/symptoms of  physical distress.    Intensity THRR unchanged THRR unchanged THRR unchanged THRR unchanged      Progression   Progression Continue to progress workloads to maintain intensity without signs/symptoms of physical distress. Continue to progress workloads to maintain intensity without signs/symptoms of physical distress. Continue to progress workloads to maintain intensity without signs/symptoms of physical distress. Continue to progress workloads to maintain intensity without signs/symptoms of physical distress.      Resistance Training   Training Prescription Yes Yes Yes Yes    Weight orange bands orange bands orange bands orange bands    Reps 10-15 10-15 10-15 10-15    Time 10 Minutes 10 Minutes 10 Minutes 10 Minutes      Oxygen   Oxygen Continuous Continuous Continuous Continuous    Liters 8-10 8-10 8-10 15      Treadmill   MPH 2.2 2.1 2.1 2.4    Grade 0 0 0 3    Minutes _8 Bike   Level 0.8 0.8 1 1.4    Minutes _9 NuStep   Level 3 4  - 4    Minutes 17 17  - 17    METs 1.7 1.8  - 2.8  Exercise Comments:   Exercise Goals and Review:     Exercise Goals    Row Name 03/19/17 1125             Exercise Goals   Increase Physical Activity Yes       Intervention Provide advice, education, support and counseling about physical activity/exercise needs.;Develop an individualized exercise prescription for aerobic and resistive training based on initial evaluation findings, risk stratification, comorbidities and participant's personal goals.       Expected Outcomes Achievement of increased cardiorespiratory fitness and enhanced flexibility, muscular endurance and strength shown through measurements of functional capacity and personal statement of participant.       Increase Strength and Stamina Yes       Intervention Provide advice, education, support and counseling about physical activity/exercise needs.;Develop an individualized exercise  prescription for aerobic and resistive training based on initial evaluation findings, risk stratification, comorbidities and participant's personal goals.       Expected Outcomes Achievement of increased cardiorespiratory fitness and enhanced flexibility, muscular endurance and strength shown through measurements of functional capacity and personal statement of participant.          Exercise Goals Re-Evaluation :     Exercise Goals Re-Evaluation    Row Name 03/26/17 1527 04/27/17 0814           Exercise Goal Re-Evaluation   Exercise Goals Review Increase Strenth and Stamina;Increase Physical Activity Increase Physical Activity;Increase Strength and Stamina;Able to understand and use Dyspnea scale;Able to understand and use rate of perceived exertion (RPE) scale;Knowledge and understanding of Target Heart Rate Range (THRR);Understanding of Exercise Prescription      Comments Patient has only completed his 86mt. Will cont. to monitor and progress as able.  Patient has been desatruating at low workloads. Just receieved order from Dr. to increase liter flow to maintain oxygen saturation above 85%. Will now be able to increase workloads.       Expected Outcomes Through the exercise at rehab and at home, patient will increase physical activity, strength, and stamina.  Through the exercise at rehab and at home, patient will increase physical activity, strength, and stamina.          Discharge Exercise Prescription (Final Exercise Prescription Changes):     Exercise Prescription Changes - 04/27/17 1231      Response to Exercise   Blood Pressure (Admit) 98/62   Blood Pressure (Exercise) 144/68   Blood Pressure (Exit) 100/66   Heart Rate (Admit) 55 bpm   Heart Rate (Exercise) 111 bpm   Heart Rate (Exit) 68 bpm   Oxygen Saturation (Admit) 98 %   Oxygen Saturation (Exercise) 86 %  15 L   Oxygen Saturation (Exit) 92 %   Rating of Perceived Exertion (Exercise) 13   Perceived Dyspnea  (Exercise) 2   Duration Continue with 45 min of aerobic exercise without signs/symptoms of physical distress.   Intensity THRR unchanged     Progression   Progression Continue to progress workloads to maintain intensity without signs/symptoms of physical distress.     Resistance Training   Training Prescription Yes   Weight orange bands   Reps 10-15   Time 10 Minutes     Oxygen   Oxygen Continuous   Liters 15     Treadmill   MPH 2.4   Grade 3   Minutes 17     Bike   Level 1.4   Minutes 17     NuStep   Level 4   Minutes  17   METs 2.8      Nutrition:  Target Goals: Understanding of nutrition guidelines, daily intake of sodium <1513m, cholesterol <2035m calories 30% from fat and 7% or less from saturated fats, daily to have 5 or more servings of fruits and vegetables.  Biometrics:     Pre Biometrics - 03/25/17 0700      Pre Biometrics   Grip Strength 36 kg       Nutrition Therapy Plan and Nutrition Goals:     Nutrition Therapy & Goals - 04/26/17 1447      Nutrition Therapy   Diet TLC     Personal Nutrition Goals   Nutrition Goal Describe the benefit of including fruits, vegetables, whole grains, and low-fat dairy products in a healthy meal plan.     Intervention Plan   Intervention Prescribe, educate and counsel regarding individualized specific dietary modifications aiming towards targeted core components such as weight, hypertension, lipid management, diabetes, heart failure and other comorbidities.   Expected Outcomes Short Term Goal: Understand basic principles of dietary content, such as calories, fat, sodium, cholesterol and nutrients.;Long Term Goal: Adherence to prescribed nutrition plan.      Nutrition Discharge: Rate Your Plate Scores:     Nutrition Assessments - 04/26/17 1447      Rate Your Plate Scores   Pre Score 63      Nutrition Goals Re-Evaluation:   Nutrition Goals Discharge (Final Nutrition Goals  Re-Evaluation):   Psychosocial: Target Goals: Acknowledge presence or absence of significant depression and/or stress, maximize coping skills, provide positive support system. Participant is able to verbalize types and ability to use techniques and skills needed for reducing stress and depression.  Initial Review & Psychosocial Screening:     Initial Psych Review & Screening - 03/19/17 1149      Initial Review   Current issues with Current Depression     Family Dynamics   Good Support System? Yes     Barriers   Psychosocial barriers to participate in program Psychosocial barriers identified (see note);The patient should benefit from training in stress management and relaxation.     Screening Interventions   Interventions Yes;To provide support and resources with identified psychosocial needs;Provide feedback about the scores to participant;Program counselor consult;Encouraged to exercise   Expected Outcomes Short Term goal: Utilizing psychosocial counselor, staff and physician to assist with identification of specific Stressors or current issues interfering with healing process. Setting desired goal for each stressor or current issue identified.      Quality of Life Scores:   PHQ-9: Recent Review Flowsheet Data    [facility]   Decreased Interest 0   Down, Depressed, Hopeless 2   PHQ - 2 Score 2   Altered sleeping 0   Tired, decreased energy 1   Change in appetite 0   Feeling bad or failure about yourself  2   Trouble concentrating 0   Moving slowly or fidgety/restless 0   Suicidal thoughts 0   PHQ-9 Score 5   Difficult doing work/chores Not difficult at all     Interpretation of Total Score  Total Score Depression Severity:  1-4 = Minimal depression, 5-9 = Mild depression, 10-14 = Moderate depression, 15-19 = Moderately severe depression, 20-27 = Severe depression   Psychosocial Evaluation and Intervention:     Psychosocial Evaluation -  03/19/17 1159      Psychosocial Evaluation & Interventions   Comments (P)  referral to [facility](  P)  Follow up required by staff      Psychosocial Re-Evaluation:     Psychosocial Re-Evaluation    [place] Name 04/28/17 1728             Psychosocial Re-Evaluation   Current issues with Current Stress Concerns;Current Depression;History of Depression;Current Anxiety/Panic;Current Sleep Concerns       Comments patient has significant depression. it is affecting his participation in rehab. he has been referred to Jeanella Craze and sees him on a regular basis for counceling       Expected Outcomes patient will have a reduction in his psychosocial barrier symptoms (a reduction in tearfulness while at rehab)       Interventions Physician referral;Therapist referral;Encouraged to attend Pulmonary Rehabilitation for the exercise       Continue Psychosocial Services  Follow up required by staff         Initial Review   Source of Stress Concerns Chronic Illness;Family;Unable to participate in former interests or hobbies;Unable to perform yard/household activities;Retirement/disability          Psychosocial Discharge (Final Psychosocial Re-Evaluation):     Psychosocial Re-Evaluation - 04/28/17 1728      Psychosocial Re-Evaluation   Current issues with Current Stress Concerns;Current Depression;History of Depression;Current Anxiety/Panic;Current Sleep Concerns   Comments patient has significant depression. it is affecting his participation in rehab. he has been referred to Jeanella Craze and sees him on a regular basis for counceling   Expected Outcomes patient will have a reduction in his psychosocial barrier symptoms (a reduction in tearfulness while at rehab)   Interventions Physician referral;Therapist referral;Encouraged to attend Pulmonary Rehabilitation for the exercise   Continue Psychosocial Services  Follow up required by staff     Initial Review    Source of Stress Concerns Chronic Illness;Family;Unable to participate in former interests or hobbies;Unable to perform yard/household activities;Retirement/disability      Education: Education Goals: Education classes will be provided on a weekly basis, covering required topics. Participant will state understanding/return demonstration of topics presented.  Learning Barriers/Preferences:     Learning Barriers/Preferences - 03/19/17 1147      Learning Barriers/Preferences   Learning Barriers None   Learning Preferences Written Material;Skilled Demonstration;Individual Instruction;Group Instruction      Education Topics: Risk Factor Reduction:  -Group instruction that is supported by a PowerPoint presentation. Instructor discusses the definition of a risk factor, different risk factors for pulmonary disease, and how the heart and lungs work together.     Nutrition for Pulmonary Patient:  -Group instruction provided by PowerPoint slides, verbal discussion, and written materials to support subject matter. The instructor gives an explanation and review of healthy diet recommendations, which includes a discussion on weight management, recommendations for fruit and vegetable consumption, as well as protein, fluid, caffeine, fiber, sodium, sugar, and alcohol. Tips for eating when patients are short of breath are discussed.   PULMONARY REHAB OTHER RESPIRATORY from 04/22/2017 in [place]  Date  04/22/17  Educator  Nutritionist  Instruction Review Code  2- meets goals/outcomes      Pursed Lip Breathing:  -Group instruction that is supported by demonstration and informational handouts. Instructor discusses the benefits of pursed lip and diaphragmatic breathing and detailed demonstration on how to preform both.     Oxygen Safety:  -Group instruction provided by PowerPoint, verbal discussion, and written material to support subject matter. There is an overview  of "What is Oxygen" and "Why do we need it".  Instructor also  reviews how to create a safe environment for oxygen use, the importance of using oxygen as prescribed, and the risks of noncompliance. There is a brief discussion on traveling with oxygen and resources the patient may utilize.   PULMONARY REHAB OTHER RESPIRATORY from 04/22/2017 in [place]  Date  04/08/17  Educator  Truddie Crumble  Instruction Review Code  2- meets goals/outcomes      Oxygen Equipment:  -Group instruction provided by [employer] Staff utilizing handouts, written materials, and equipment demonstrations.   Signs and Symptoms:  -Group instruction provided by written material and verbal discussion to support subject matter. Warning signs and symptoms of infection, stroke, and heart attack are reviewed and when to call the physician/911 reinforced. Tips for preventing the spread of infection discussed.   Advanced Directives:  -Group instruction provided by verbal instruction and written material to support subject matter. Instructor reviews Advanced Directive laws and proper instruction for filling out document.   Pulmonary Video:  -Group video education that reviews the importance of medication and oxygen compliance, exercise, good nutrition, pulmonary hygiene, and pursed lip and diaphragmatic breathing for the pulmonary patient.   Exercise for the Pulmonary Patient:  -Group instruction that is supported by a PowerPoint presentation. Instructor discusses benefits of exercise, core components of exercise, frequency, duration, and intensity of an exercise routine, importance of utilizing pulse oximetry during exercise, safety while exercising, and options of places to exercise outside of rehab.     Pulmonary Medications:  -Verbally interactive group education provided by instructor with focus on inhaled medications and proper administration.   Anatomy and Physiology of the Respiratory  System and Intimacy:  -Group instruction provided by PowerPoint, verbal discussion, and written material to support subject matter. Instructor reviews respiratory cycle and anatomical components of the respiratory system and their functions. Instructor also reviews differences in obstructive and restrictive respiratory diseases with examples of each. Intimacy, Sex, and Sexuality differences are reviewed with a discussion on how relationships can change when diagnosed with pulmonary disease. Common sexual concerns are reviewed.   MD DAY -A group question and answer session with a medical doctor that allows participants to ask questions that relate to their pulmonary disease state.   OTHER EDUCATION -Group or individual verbal, written, or video instructions that support the educational goals of the pulmonary rehab program.   Knowledge Questionnaire Score:     Knowledge Questionnaire Score - 03/31/17 0825      Knowledge Questionnaire Score   Pre Score 11/13      Core Components/Risk Factors/Patient Goals at Admission:     Personal Goals and Risk Factors at Admission - 03/19/17 1148      Core Components/Risk Factors/Patient Goals on Admission   Improve shortness of breath with ADL's Yes   Intervention Provide education, individualized exercise plan and daily activity instruction to help decrease symptoms of SOB with activities of daily living.   Expected Outcomes Short Term: Achieves a reduction of symptoms when performing activities of daily living.   Develop more efficient breathing techniques such as purse lipped breathing and diaphragmatic breathing; and practicing self-pacing with activity Yes   Intervention Provide education, demonstration and support about specific breathing techniuqes utilized for more efficient breathing. Include techniques such as pursed lipped breathing, diaphragmatic breathing and self-pacing activity.   Expected Outcomes Short Term: Participant will be able  to demonstrate and use breathing techniques as needed throughout daily activities.   Stress Yes   Intervention Offer individual and/or small group education  and counseling on adjustment to heart disease, stress management and health-related lifestyle change. Teach and support self-help strategies.;Refer participants experiencing significant psychosocial distress to appropriate mental health specialists for further evaluation and treatment. When possible, include family members and significant others in education/counseling sessions.   Expected Outcomes Short Term: Participant demonstrates changes in health-related behavior, relaxation and other stress management skills, ability to obtain effective social support, and compliance with psychotropic medications if prescribed.;Long Term: Emotional wellbeing is indicated by absence of clinically significant psychosocial distress or social isolation.      Core Components/Risk Factors/Patient Goals Review:      Goals and Risk Factor Review    Row Name 04/28/17 1724             Core Components/Risk Factors/Patient Goals Review   Personal Goals Review Improve shortness of breath with ADL's;Develop more efficient breathing techniques such as purse lipped breathing and diaphragmatic breathing and practicing self-pacing with activity.;Stress       Review Patient has struggles significantly in the program thus far. he is unable to exercise at what he feels to be a sufficient workload related to significant desaturations. recently recieved order from Dr. Lake Bells for oxygen titration to maintain saturations >85%. Patient also has significant depression. he has regular sessions with Jeanella Craze. he has been encouraged to seek medical advice for his depression. he is constantly tearful during his rehab session and requires alot of one on one attention to discuss stressors and exercise intolerance. he had a follow-up with pulmonologist today and hopefully these  issues will be addressed.       Expected Outcomes see admission outcomes          Core Components/Risk Factors/Patient Goals at Discharge (Final Review):      Goals and Risk Factor Review - 04/28/17 1724      Core Components/Risk Factors/Patient Goals Review   Personal Goals Review Improve shortness of breath with ADL's;Develop more efficient breathing techniques such as purse lipped breathing and diaphragmatic breathing and practicing self-pacing with activity.;Stress   Review Patient has struggles significantly in the program thus far. he is unable to exercise at what he feels to be a sufficient workload related to significant desaturations. recently recieved order from Dr. Lake Bells for oxygen titration to maintain saturations >85%. Patient also has significant depression. he has regular sessions with Jeanella Craze. he has been encouraged to seek medical advice for his depression. he is constantly tearful during his rehab session and requires alot of one on one attention to discuss stressors and exercise intolerance. he had a follow-up with pulmonologist today and hopefully these issues will be addressed.   Expected Outcomes see admission outcomes      ITP Comments:   Comments: ITP REVIEW Pt is not making expected progress toward pulmonary rehab goals after completing 9 sessions. He is having physical and psychosocial barriers to maximum participation. Hope to see some issues resolved over the next 30 days and begin to see progression towards goals. Recommend continued exercise, life style modification, education, and utilization of breathing techniques to increase stamina and strength and decrease shortness of breath with exertion.

## 2017-04-28 NOTE — Assessment & Plan Note (Addendum)
Platelets 107,000 No signs or symptoms of bleeding Negative for excessive bruising or purpura, petechiae Patient denies any bleeding from gums or nose Patient denies any blood in stool or urine Plan Follow-up CBC 05/2017 Please call the office if you notice any bleeding or bruising

## 2017-04-28 NOTE — Progress Notes (Signed)
History of Present Illness Alger Kerstein Millar is a 71 y.o. male former smoker with ( 18 pack years)  quit 08/1985.He has Pulmonary Hypertension and COPD, and centrilobular emphysema. He is followed by Dr. Lake Bells.  Synopsis: Patient followed by Dr. Lamonte Sakai for years transferred care to [facility] in 2018. Has a past medical history significant for pulmonary hypertension, COPD. 2018 CT scan of the chest showed centrilobular emphysema.  04/28/2017 Follow up for desaturations with exertion noted at pulmonary rehabilitation: Patient presents for follow-up after communication from [facility] in pulmonary rehabilitation that Patient has been having severe oxygen desaturations with increase work loads. Dr. Lake Bells wanted patient seen to evaluate possible acute and etiology. Pt presents stating he feels better despite desaturations.He states he is not aware of dyspnea. He is wearing his oxygen  24/7 even at rest. His saturation was 89% today on 2.5 L. He states he was pushed at pulmonary rehab yesterday, after which he has had a runny nose. He states he had clear drainage with sneezing. He started [facility] 04/27/2017 as treatment.. He states he is better today.He states his energy is better. He states he knows his limits now, and is not playing golf at present. He was told that either Dr. Haroldine Laws or Dr. Lake Bells gave the okay to pulmonary rehabilitation to push him. Additionally he states that there was an  okay given to  maintaining sats greater than 86% with exertion. He states he has no cough, no chest pain, no fever, no orthopnea, or hemoptysis. He denies leg or calf pain, he denies recent air or automobile travel. He denies any swelling in his feet or hands. He is compliant daily with his Opsumit.Marland Kitchen He is scheduled for a 2-D echo November 2018 per Dr. Haroldine Laws. He appears to be in absolutely no distress today in the office.    Test Results: 04/28/2017 : Hemoglobin 15.4, WBC 7.2  04/28/2017: Chest x-ray    Chest  imaging: April 2018 CT chest images independently reviewed showing moderate to severe centrilobular emphysema, 3 mm right upper lobe solid pulmonary nodule. Emphysema is upper lobe predominant April 2018 VQ scan showed some mismatch in the upper lobes where he has emphysema but otherwise no worrisome findings for chronic, embolic pulmonary hypertension  Echo: February 2018 LVEF 60-65% with grade 1 diastolic dysfunction RV mildly dilated with normal function.  PFT: January 2018: Ratio 65%, FEV1 2.96 L 85% predicted, 13 % change post bronchodilator, FVC 4.5 L 96% predicted, total lung capacity 6.84 L 92% predicted, DLCO 8.5 324% predicted Mild airflow obstruction but severe symptoms.  Heart Cath: 10/2016 [facility]: RA 4, RV 78/4, PA 79/23 (43), PCW 4, Fick 5.2/2.5 10/2016 LHC:   Mild to moderate left sided CAD.  Mid RCA lesion, 100 %stenosed. 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  Ost LM to LM lesion, 20 %stenosed.  The left ventricular systolic function is normal.  LV end diastolic pressure is normal.  CBC Latest Ref Rng & Units 04/28/2017 12/02/2016 12/02/2016  WBC 4.0 - 10.5 K/uL 7.2 - 9.5  Hemoglobin 13.0 - 17.0 g/dL 15.4 16.3 16.2  Hematocrit 39.0 - 52.0 % 45.1 48.0 46.9  Platelets 150.0 - 400.0 K/uL 107.0(L) - 130(L)    BMP Latest Ref Rng & Units 12/02/2016 11/12/2016 02/03/2016  Glucose 65 - 99 mg/dL 97 86 99  BUN 6 - 20 mg/dL _0 Creatinine 0.61 - 1.24 mg/dL 1.00 1.03 0.89  BUN/Creat Ratio 10 - 24 - 16 -  Sodium 135 - 145 mmol/L 143 143 141  Potassium 3.5 - 5.1 mmol/L 4.2 4.8 4.5  Chloride 101 - 111 mmol/L 104 104 107  CO2 18 - 29 mmol/L - 21 23  Calcium 8.6 - 10.2 mg/dL - 9.0 8.8    BNP    Component Value Date/Time   BNP 63.1 02/03/2016 0824     PFT    Component Value Date/Time   FEV1PRE 2.61 09/29/2016 0957   FEV1POST 2.96 09/29/2016 0957   FVCPRE 4.15 09/29/2016 0957   FVCPOST 4.58 09/29/2016 0957   TLC 6.84 09/29/2016  0957   DLCOUNC 8.53 09/29/2016 0957   PREFEV1FVCRT 63 09/29/2016 0957   PSTFEV1FVCRT 65 09/29/2016 0957      Past medical hx Past Medical History:  Diagnosis Date  . CAD (coronary artery disease)   . GAD (generalized anxiety disorder)   . Hyperlipidemia   . Overweight(278.02)   . Pulmonary hypertension ([place])      Social History  Substance Use Topics  . Smoking status: Former Smoker    Packs/day: 1.00    Years: 18.00    Quit date: 09/27/1985  . Smokeless tobacco: Never Used  . Alcohol use Yes    Mr.Mellin reports that he quit smoking about 31 years ago. He has a 18.00 pack-year smoking history. He has never used smokeless tobacco. He reports that he drinks alcohol. He reports that he does not use drugs.  Tobacco Cessation: Former smoker quit in 1987  Past surgical hx, Family hx, Social hx all reviewed.  Current Outpatient Prescriptions on File Prior to Visit  Medication Sig  . aspirin 81 MG tablet Take 81 mg by mouth every evening.   . clonazePAM (KLONOPIN) 0.5 MG tablet Take 0.5 mg by mouth 2 (two) times daily as needed for anxiety.   . Coenzyme Q10 (VITALINE COQ10) 60 MG TABS Take 60 mg by mouth daily.  . diphenhydrAMINE (BENADRYL) 12.5 MG/5ML liquid Take 25 mg by mouth at bedtime as needed for sleep.  Marland Kitchen ezetimibe (ZETIA) 10 MG tablet Take 1 tablet (10 mg total) by mouth every evening.  Marland Kitchen ibuprofen (ADVIL,MOTRIN) 200 MG tablet Take 400 mg by mouth 2 (two) times daily as needed for headache or moderate pain.  . Lactobacillus (ACIDOPHILUS PO) Take 1 capsule by mouth daily.   . Macitentan (OPSUMIT) 10 MG TABS Take 1 tablet (10 mg total) by mouth daily.  . Multiple Vitamins-Minerals (CENTRUM CARDIO PO) Take 1 tablet by mouth daily.   . Omega-3 Fatty Acids (OMEGA 3 PO) Take 3,200 mg by mouth 2 (two) times daily. Nordic Natural ultimate Omega 3   . umeclidinium-vilanterol (ANORO ELLIPTA) 62.5-25 MCG/INH AEPB Inhale 1 puff into the lungs daily.  . vitamin B-12 (CYANOCOBALAMIN)  1000 MCG tablet Take 1,000 mcg by mouth daily.  . rosuvastatin (CRESTOR) 40 MG tablet Take 1 tablet (40 mg total) by mouth daily.   No current facility-administered medications on file prior to visit.      Allergies  Allergen Reactions  . Penicillins Hives    Has patient had a PCN reaction causing immediate rash, facial/tongue/throat swelling, SOB or lightheadedness with hypotension: No Has patient had a PCN reaction causing severe rash involving mucus membranes or skin necrosis: Yes Has patient had a PCN reaction that required hospitalization Unknown Has patient had a PCN reaction occurring within the last 10 years: No If all of the above answers are "NO", then may proceed with Cephalosporin use.   . Symbicort [Budesonide-Formoterol Fumarate]  Severe cough    Review Of Systems:  Constitutional:   No  weight loss, night sweats,  Fevers, chills, fatigue, or  lassitude.  HEENT:   No headaches,  Difficulty swallowing,  Tooth/dental problems, or  Sore throat,                + sneezing, itching, ear ache, nasal congestion, post nasal drip,   CV:  No chest pain,  Orthopnea, PND, swelling in lower extremities, anasarca, dizziness, palpitations, syncope.   GI  No heartburn, indigestion, abdominal pain, nausea, vomiting, diarrhea, change in bowel habits, loss of appetite, bloody stools.   Resp: + shortness of breath with exertion not at rest.  No excess mucus, no productive cough,  No non-productive cough,  No coughing up of blood.  No change in color of mucus.  No wheezing.  No chest wall deformity  Skin: no rash or lesions.  GU: no dysuria, change in color of urine, no urgency or frequency.  No flank pain, no hematuria   MS:  No joint pain or swelling.  No decreased range of motion.  No back pain.  Psych:  No change in mood or affect. No depression or anxiety.  No memory loss.   Vital Signs BP 104/62 (BP Location: Left Arm, Cuff Size: Normal)   Pulse 65   Ht 6' (1.829 m)   Wt  174 lb 12.8 oz (79.3 kg)   SpO2 (!) 89%   BMI 23.71 kg/m   Body mass index is 23.71 kg/m.   Physical Exam:  General- No distress,  A&Ox3, pleasant ENT: No sinus tenderness, TM clear, pale nasal mucosa, no oral exudate,no post nasal drip, no LAN Cardiac: S1, S2, regular rate and rhythm, no murmur Chest: No wheeze/ rales/ dullness; no accessory muscle use, no nasal flaring, no sternal retractions Abd.: Soft Non-tender, bowel sounds positive, nondistended Ext: No clubbing cyanosis, edema Neuro:  normal strength Skin: No rashes, warm and dry Psych: normal mood and behavior   Assessment/Plan  Pulmonary hypertension (HCC) Severe oxygen desaturations with exertion Compliant with oxygen use and Opsumit Plan We will check some labs today. (CBC) Chest x-ray today Echo 07/07/2017 per Dr. Jeffie Pollock as ordered. Right heart cath per Dr. Jeffie Pollock Sleep study per [facility] as a scheduled Continue wearing oxygen at 2 Liters at rest and 5-6 L for exertion. Saturation goals are > 88% at rest and with mild exertion Continue with Pulmonary Rehab as you have been doing. Oxygen saturation goals at pulmonary rehabilitation greater than 86% with exertion Follow up with Dr. Lake Bells 05/26/2017 as is scheduled  Consider earlier evaluation by echo   Protein calorie malnutrition ([place]) BMI 23.71 kg/m today Patient states he has poor appetite Plan Boost meal supplementation  Chronic respiratory failure with hypoxia (HCC) Continue wearing oxygen at 2 Liters at rest and 5-6 L for exertion. Saturation goals are > 88% at rest and with mild exertion Continue with Pulmonary Rehab as you have been doing. Oxygen saturation goals at pulmonary rehabilitation greater than 86% with exertion Follow up with Dr. Lake Bells 05/26/2017 as is scheduled   Centrilobular emphysema ([place]) We will try Trelegy  as a therapeutic trial in place of Anoro Rinse mouth after use Continue wearing oxygen at 2 Liters at rest and 5-6  L for exertion. Saturation goals are > 88%-92% at rest and with mild exertion Continue with Pulmonary Rehab as you have been doing. Oxygen saturation goals at pulmonary rehabilitation greater than 86% with exertion Follow up with Dr. Lake Bells  05/26/2017 as is scheduled Follow-up CT chest 12/18/2017 or before as follow-up of 3 mm right upper lobe solid pulmonary nodule.   Thrombocytopenia (HCC) Platelets 107,000 No signs or symptoms of bleeding Negative for excessive bruising or purpura, petechiae Patient denies any bleeding from gums or nose Patient denies any blood in stool or urine Plan Follow-up CBC 05/2017 Please call the office if you notice any bleeding or bruising    Magdalen Spatz, NP 04/28/2017  8:20 PM

## 2017-04-28 NOTE — Assessment & Plan Note (Signed)
BMI 23.71 kg/m today Patient states he has poor appetite Plan Boost meal supplementation

## 2017-04-28 NOTE — Patient Instructions (Addendum)
It is nice to see you today. We will check some labs today. (CBC) Echo 07/07/2017 per Dr. Jeffie Pollock as ordered. Right heart cath per Dr. Jeffie Pollock CXR today  Continue Boost meal supplements. Sleep study per [facility] as a scheduled We will try Trelegy  as a therapeutic trial in place of Anoro Rinse mouth after use Continue wearing oxygen at 2 Liters at rest and 5-6 L for exertion. Saturation goals are > 88% at rest and with mild exertion Continue with Pulmonary Rehab as you have been doing. Oxygen saturation goals at pulmonary rehabilitation greater than 86% with exertion Follow up with Dr. Lake Bells 05/26/2017 as is scheduled

## 2017-04-28 NOTE — Assessment & Plan Note (Addendum)
Severe oxygen desaturations with exertion Compliant with oxygen use and Opsumit Plan We will check some labs today. (CBC) Chest x-ray today Echo 07/07/2017 per Dr. Jeffie Pollock as ordered. Right heart cath per Dr. Jeffie Pollock Sleep study per [facility] as a scheduled Continue wearing oxygen at 2 Liters at rest and 5-6 L for exertion. Saturation goals are > 88% at rest and with mild exertion Continue with Pulmonary Rehab as you have been doing. Oxygen saturation goals at pulmonary rehabilitation greater than 86% with exertion Follow up with Dr. Lake Bells 05/26/2017 as is scheduled  Consider earlier evaluation by echo

## 2017-04-28 NOTE — Telephone Encounter (Signed)
Yes. Encounter can be closed

## 2017-04-28 NOTE — Telephone Encounter (Signed)
Dr. Lake Bells, I saw Mr. [name] today in the clinic. This is the patient and I had wanted to talk to you about. I saw him today wearing 2.5 L nasal cannula. Saturations were 89%. He appeared comfortable and in no distress. He is being compliant with oxygen use. He said that either you or Dr. Haroldine Laws gave the okay for him to be pushed at pulmonary rehabilitation. Additionally saturation low during pulmonary rehabilitation has been reduced to 86%. He has an echo scheduled for 07/2017 and then Dr. Haroldine Laws will determine need for Right heart cath based on the result.I have routed my note to you. I did a chest x-ray, and CBC to evaluate for any acute cause of desaturations. He denies any automobile or airline travel. No leg or chest pain. My questions for you are the following. Are you okay to wait until November for repeat 2-D echo/evaluation for right heart cath My thought is perhaps we should do it sooner. He needs follow-up CT chest to follow the solid pulmonary nodule noted on his CT April 2018. Recommendation was for 12 month follow-up which would be April 2019. He has had weight loss, so perhaps this should also be done sooner. Please let me know your thoughts.Thanks

## 2017-04-28 NOTE — Telephone Encounter (Addendum)
SG please advise if encounter can be closed, as message is blank. Thanks.

## 2017-04-28 NOTE — Assessment & Plan Note (Signed)
Continue wearing oxygen at 2 Liters at rest and 5-6 L for exertion. Saturation goals are > 88% at rest and with mild exertion Continue with Pulmonary Rehab as you have been doing. Oxygen saturation goals at pulmonary rehabilitation greater than 86% with exertion Follow up with Dr. Lake Bells 05/26/2017 as is scheduled

## 2017-04-29 ENCOUNTER — Encounter (HOSPITAL_COMMUNITY)
Admission: RE | Admit: 2017-04-29 | Discharge: 2017-04-29 | Disposition: A | Discharge status: Home / Self Care | Payer: Medicare Other | Source: Ambulatory Visit | Attending: Internal Medicine | Admitting: Internal Medicine

## 2017-04-29 VITALS — Wt 175.0 lb

## 2017-04-29 DIAGNOSIS — I272 Pulmonary hypertension, unspecified: Secondary | ICD-10-CM

## 2017-04-29 NOTE — Progress Notes (Signed)
Daily Session Note  Patient Details  Name: [name] MRN: [id number] Date of Birth: Jan 24, 1946 Referring Provider:     Pulmonary Rehab Walk Test from 03/23/2017 in [place]  Referring Provider  Dr. Haroldine Laws      Encounter Date: 04/29/2017  Check In:     Session Check In - 04/29/17 1030      Check-In   Location [facility]   Staff Present Ramon Dredge, RN, MHA;Lisa Ysidro Evert, RN;Kashmere Daywalt Rollene Rotunda, RN, BSN   Supervising physician immediately available to respond to emergencies [facility] immediately available   Physician(s) Dr. Justus Memory   Medication changes reported     No   Fall or balance concerns reported    No   Tobacco Cessation No Change   Warm-up and Cool-down Performed as group-led instruction   Resistance Training Performed Yes   VAD Patient? No     Pain Assessment   Currently in Pain? No/denies   Multiple Pain Sites No      Capillary Blood Glucose: Results for orders placed or performed in visit on 04/28/17 (from the past 24 hour(s))  CBC with Differential/Platelet     Status: Abnormal   Collection Time: 04/28/17  3:47 PM  Result Value Ref Range   WBC 7.2 4.0 - 10.5 K/uL   RBC 4.70 4.22 - 5.81 Mil/uL   Hemoglobin 15.4 13.0 - 17.0 g/dL   HCT 45.1 39.0 - 52.0 %   MCV 96.1 78.0 - 100.0 fl   MCHC 34.2 30.0 - 36.0 g/dL   RDW 14.0 11.5 - 15.5 %   Platelets 107.0 (L) 150.0 - 400.0 K/uL   Neutrophils Relative % 69.1 43.0 - 77.0 %   Lymphocytes Relative 17.1 12.0 - 46.0 %   Monocytes Relative 11.7 3.0 - 12.0 %   Eosinophils Relative 1.6 0.0 - 5.0 %   Basophils Relative 0.5 0.0 - 3.0 %   Neutro Abs 4.9 1.4 - 7.7 K/uL   Lymphs Abs 1.2 0.7 - 4.0 K/uL   Monocytes Absolute 0.8 0.1 - 1.0 K/uL   Eosinophils Absolute 0.1 0.0 - 0.7 K/uL   Basophils Absolute 0.0 0.0 - 0.1 K/uL        Exercise Prescription Changes - 04/29/17 1248      Response to Exercise   Blood Pressure (Admit) 86/62  reck 110/58   Blood  Pressure (Exercise) 166/72   Blood Pressure (Exit) 108/64   Heart Rate (Admit) 64 bpm   Heart Rate (Exercise) 101 bpm   Heart Rate (Exit) 66 bpm   Oxygen Saturation (Admit) 96 %  2 liters   Oxygen Saturation (Exercise) 84 %  10L INCREASED TO 15L   Oxygen Saturation (Exit) 96 %  2 liters   Rating of Perceived Exertion (Exercise) 13   Perceived Dyspnea (Exercise) 2   Duration Continue with 45 min of aerobic exercise without signs/symptoms of physical distress.   Intensity THRR unchanged     Progression   Progression Continue to progress workloads to maintain intensity without signs/symptoms of physical distress.     Resistance Training   Training Prescription Yes   Weight orange bands   Reps 10-15   Time 10 Minutes     Oxygen   Oxygen Continuous   Liters 15     Bike   Level 1  decreased workload per patients request. still desatuates   Minutes 17     NuStep   Level 4   Minutes 17   METs  2.8      History  Smoking Status  . Former Smoker  . Packs/day: 1.00  . Years: 18.00  . Quit date: 09/27/1985  Smokeless Tobacco  . Never Used    Goals Met:  Independence with exercise equipment Exercise tolerated well Queuing for purse lip breathing No report of cardiac concerns or symptoms Strength training completed today  Goals Unmet:  O2 Sat. Patient states he was "worked too hard" on Tuesday although his workloads were increased minimally related to our ability to titrate oxygen to keep sats >85%. Workloads decreased today per patients request. Desaturation continued with exertion. Patient frustrated that he requires 15 liters of oxygen to maintain saturations. States appointment with pulmonology NP was confusing for him and for the provider. Patient unable to explain why he felt that way.  Comments: Service time is from 1030 to 1220   Dr. Rush Farmer is Medical Director for Pulmonary Rehab at [facility].

## 2017-04-29 NOTE — Telephone Encounter (Signed)
I think he needs to be assessed for lung transplant.  I'm not in the hospital much, I may be able to meet with him down a pulmonary rehab.  Did this come up at all?

## 2017-04-29 NOTE — Progress Notes (Signed)
[name] 71 y.o. male  DOB: 1946-04-06 MRN: [id number]           Nutrition Note 1. Pulmonary hypertension (HCC)    Nutrition Diagnosis Increased energy expenditure related to increased energy requirements and decreased po intake as evidenced by recent h/o 6.3% decreased body wt over the past 7 months.  Food-and nutrition-related knowledge deficit related to lack of exposure to information as related to diagnosis of pulmonary disease  Note: Spoke with pt. Pt with slow, chronic wt loss over the past 3 years. Pt states he used to weigh 240 lb and "I use to have to try and lose wt." Pt wt has lost wt "without trying." Pt c/o decreased appetite. Pt states he has anxiety "all the time" and especially around meals. Pt states he takes Klonopin at night before bed. 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 Pt is no longer interested in cooking because "when you're not hungry it's hard to cook." Options for eating healthy meals discussed. Pt reports he has been drinking Boost/Ensure supplements between meals due to decreased appetite. Pt educated re: High Calorie, High Protein diet. Pt expressed understanding of the information reviewed via feedback method.   Nutrition Intervention Pt individual nutrition plan and goals reviewed. Pt given the names of several meal prep companies to look into to help with meal times ? If pt may benefit from an appetite stimulant and/or anti-anxiety medication to help with meal time anxiety/appetite Handouts given for High Calorie, High Protein diet, recipes, and Suggestions for increasing calorie and protein intake  Goal(s) Describe the benefit of including fruits, vegetables, whole grains, and dairy products in a healthy meal plan. Encourage wt gain to a minimum wt gain goal of 180 lb and long-term wt gain goal of ~195 lb.   Plan:  Pt to attend Pulmonary Nutrition class - met 04/22/17 Will provide client-centered nutrition education as part of  interdisciplinary care.   Monitor and evaluate progress toward nutrition goal with team.  Monitor and Evaluate progress toward nutrition goal with team.   Derek Mound, M.Ed, RD, LDN, CDE 04/29/2017 1:27 PM

## 2017-04-29 NOTE — Telephone Encounter (Signed)
We did not discuss lung transplant yesterday. He was unsure if he was going to go to pulmonary rehabilitation today due to his runny nose. He is  very motivated and compliant.  He would be open-minded to your suggestion.

## 2017-04-30 NOTE — Telephone Encounter (Signed)
Called and spoke with pt and he is aware of the labs that will be repeated at appt with BQ>

## 2017-04-30 NOTE — Telephone Encounter (Signed)
Patient is returning phone call.

## 2017-04-30 NOTE — Telephone Encounter (Signed)
Notes recorded by Jannette Spanner, CMA on 04/29/2017 at 9:18 AM EDT Pt advised lab results. He is concerned about his platelet count? SG please advise. ------  Notes recorded by Magdalen Spatz, NP on 04/29/2017 at 8:44 AM EDT Please call patient and let him know that his hemoglobin was fine when checked yesterday. Have him follow the plan of care developed in the office yesterday. Thank you   SG please advise. Pt is concerned at platelet count. Thanks.

## 2017-04-30 NOTE — Telephone Encounter (Signed)
We will do follow up labs when he sees [email or web address] in Sept.

## 2017-05-04 ENCOUNTER — Encounter (HOSPITAL_COMMUNITY)
Admission: RE | Admit: 2017-05-04 | Discharge: 2017-05-04 | Disposition: A | Discharge status: Home / Self Care | Payer: Medicare Other | Source: Ambulatory Visit | Attending: Internal Medicine | Admitting: Internal Medicine

## 2017-05-04 VITALS — Wt 177.9 lb

## 2017-05-04 DIAGNOSIS — Z79899 Other long term (current) drug therapy: Secondary | ICD-10-CM | POA: Diagnosis not present

## 2017-05-04 DIAGNOSIS — E785 Hyperlipidemia, unspecified: Secondary | ICD-10-CM | POA: Insufficient documentation

## 2017-05-04 DIAGNOSIS — I2510 Atherosclerotic heart disease of native coronary artery without angina pectoris: Secondary | ICD-10-CM | POA: Diagnosis not present

## 2017-05-04 DIAGNOSIS — Z87891 Personal history of nicotine dependence: Secondary | ICD-10-CM | POA: Insufficient documentation

## 2017-05-04 DIAGNOSIS — I272 Pulmonary hypertension, unspecified: Secondary | ICD-10-CM | POA: Insufficient documentation

## 2017-05-04 DIAGNOSIS — F411 Generalized anxiety disorder: Secondary | ICD-10-CM | POA: Insufficient documentation

## 2017-05-04 DIAGNOSIS — Z7982 Long term (current) use of aspirin: Secondary | ICD-10-CM | POA: Diagnosis not present

## 2017-05-04 NOTE — Progress Notes (Signed)
Daily Session Note  Patient Details  Name: [name] MRN: [id number] Date of Birth: 1945-11-17 Referring Provider:     Pulmonary Rehab Walk Test from 03/23/2017 in [place]  Referring Provider  Dr. Haroldine Laws      Encounter Date: 05/04/2017  Check In:     Session Check In - 05/04/17 1217      Check-In   Location [facility]   Staff Present Su Hilt, MS, ACSM RCEP, Exercise Physiologist;Joell Usman Ysidro Evert, RN;Other;Portia Rollene Rotunda, RN, BSN   Supervising physician immediately available to respond to emergencies [facility] immediately available   Physician(s) Dr. Bonner Puna   Medication changes reported     No   Fall or balance concerns reported    No   Tobacco Cessation No Change   Warm-up and Cool-down Performed as group-led instruction   Resistance Training Performed Yes   VAD Patient? No     Pain Assessment   Currently in Pain? No/denies   Multiple Pain Sites No      Capillary Blood Glucose: No results found for this or any previous visit (from the past 24 hour(s)).      Exercise Prescription Changes - 05/04/17 1200      Response to Exercise   Blood Pressure (Admit) 90/52   Blood Pressure (Exit) 100/50   Heart Rate (Admit) 62 bpm   Heart Rate (Exercise) 103 bpm   Heart Rate (Exit) 66 bpm   Oxygen Saturation (Admit) 99 %   Oxygen Saturation (Exercise) 81 %  O2 increased to 15 L sat up to 88   Oxygen Saturation (Exit) 95 %   Rating of Perceived Exertion (Exercise) 13   Perceived Dyspnea (Exercise) 2   Duration Continue with 45 min of aerobic exercise without signs/symptoms of physical distress.   Intensity THRR unchanged     Progression   Progression Continue to progress workloads to maintain intensity without signs/symptoms of physical distress.     Resistance Training   Training Prescription Yes   Weight orange bands   Reps 10-15   Time 10 Minutes     Oxygen   Oxygen Continuous   Liters 15     Treadmill   MPH 2.4   Grade 0   Minutes 17     Bike   Level 1.4   Minutes 17     NuStep   Level 4   Minutes 17      History  Smoking Status  . Former Smoker  . Packs/day: 1.00  . Years: 18.00  . Quit date: 09/27/1985  Smokeless Tobacco  . Never Used    Goals Met:  No report of cardiac concerns or symptoms Strength training completed today  Goals Unmet:  O2 Sat  Comments: Service time is from 1030 to 1205    Dr. Rush Farmer is Medical Director for Pulmonary Rehab at [facility].

## 2017-05-05 NOTE — Progress Notes (Signed)
Reviewed, agree 

## 2017-05-06 ENCOUNTER — Encounter (HOSPITAL_COMMUNITY)
Admission: RE | Admit: 2017-05-06 | Discharge: 2017-05-06 | Disposition: A | Discharge status: Home / Self Care | Payer: Medicare Other | Source: Ambulatory Visit | Attending: Internal Medicine | Admitting: Internal Medicine

## 2017-05-06 VITALS — Wt 177.2 lb

## 2017-05-06 DIAGNOSIS — I272 Pulmonary hypertension, unspecified: Secondary | ICD-10-CM

## 2017-05-06 NOTE — Progress Notes (Signed)
Daily Session Note  Patient Details  Name: [name] MRN: [id number] Date of Birth: 1946/05/17 Referring Provider:     Pulmonary Rehab Walk Test from 03/23/2017 in [place]  Referring Provider  Dr. Haroldine Laws      Encounter Date: 05/06/2017  Check In:     Session Check In - 05/06/17 1215      Check-In   Location [facility]   Staff Present Su Hilt, MS, ACSM RCEP, Exercise Physiologist;Carina Chaplin Ysidro Evert, RN;Other;Portia Rollene Rotunda, RN, BSN   Supervising physician immediately available to respond to emergencies [facility] immediately available   Physician(s) Dr. Bonner Puna   Medication changes reported     No   Fall or balance concerns reported    No   Tobacco Cessation No Change   Warm-up and Cool-down Performed as group-led instruction   Resistance Training Performed Yes   VAD Patient? No     Pain Assessment   Currently in Pain? No/denies   Multiple Pain Sites No      Capillary Blood Glucose: No results found for this or any previous visit (from the past 24 hour(s)).      Exercise Prescription Changes - 05/06/17 1200      Response to Exercise   Blood Pressure (Admit) 90/50   Blood Pressure (Exercise) 110/62   Blood Pressure (Exit) 98/58   Heart Rate (Admit) 59 bpm   Heart Rate (Exercise) 101 bpm   Heart Rate (Exit) 69 bpm   Oxygen Saturation (Admit) 93 %   Oxygen Saturation (Exercise) 83 %  O2 increased to 15L sat up to 88%   Oxygen Saturation (Exit) 97 %   Rating of Perceived Exertion (Exercise) 11   Perceived Dyspnea (Exercise) 1   Duration Continue with 45 min of aerobic exercise without signs/symptoms of physical distress.   Intensity THRR unchanged     Progression   Progression Continue to progress workloads to maintain intensity without signs/symptoms of physical distress.     Resistance Training   Training Prescription Yes   Weight orange bands   Reps 10-15   Time 10 Minutes     Oxygen   Oxygen Continuous   Liters 15     Treadmill   MPH 2.4   Grade 0   Minutes 17     Bike   Level 1.4   Minutes 17      History  Smoking Status  . Former Smoker  . Packs/day: 1.00  . Years: 18.00  . Quit date: 09/27/1985  Smokeless Tobacco  . Never Used    Goals Met:  No report of cardiac concerns or symptoms Strength training completed today  Goals Unmet:  O2 Sat  Comments: Service time is from 1030 to 1200    Dr. Rush Farmer is Medical Director for Pulmonary Rehab at [facility].

## 2017-05-10 ENCOUNTER — Telehealth: Payer: Self-pay | Admitting: Pulmonary Disease

## 2017-05-10 ENCOUNTER — Ambulatory Visit (INDEPENDENT_AMBULATORY_CARE_PROVIDER_SITE_OTHER): Payer: Medicare Other | Admitting: Neurology

## 2017-05-10 DIAGNOSIS — R9431 Abnormal electrocardiogram [ECG] [EKG]: Secondary | ICD-10-CM

## 2017-05-10 DIAGNOSIS — R0683 Snoring: Secondary | ICD-10-CM

## 2017-05-10 DIAGNOSIS — J439 Emphysema, unspecified: Secondary | ICD-10-CM

## 2017-05-10 DIAGNOSIS — G4734 Idiopathic sleep related nonobstructive alveolar hypoventilation: Secondary | ICD-10-CM | POA: Diagnosis not present

## 2017-05-10 DIAGNOSIS — Z9981 Dependence on supplemental oxygen: Secondary | ICD-10-CM

## 2017-05-10 DIAGNOSIS — I272 Pulmonary hypertension, unspecified: Secondary | ICD-10-CM

## 2017-05-10 DIAGNOSIS — G4720 Circadian rhythm sleep disorder, unspecified type: Secondary | ICD-10-CM
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
## 2017-05-11 ENCOUNTER — Telehealth: Payer: Self-pay

## 2017-05-11 ENCOUNTER — Encounter (HOSPITAL_COMMUNITY)
Admission: RE | Admit: 2017-05-11 | Discharge: 2017-05-11 | Disposition: A | Discharge status: Home / Self Care | Payer: Medicare Other | Source: Ambulatory Visit | Attending: Internal Medicine | Admitting: Internal Medicine

## 2017-05-11 ENCOUNTER — Other Ambulatory Visit: Payer: Self-pay | Admitting: Acute Care

## 2017-05-11 VITALS — Wt 178.1 lb

## 2017-05-11 DIAGNOSIS — I272 Pulmonary hypertension, unspecified: Secondary | ICD-10-CM | POA: Diagnosis not present

## 2017-05-11 MED ORDER — FLUTICASONE-UMECLIDIN-VILANT 100-62.5-25 MCG/INH IN AEPB: 1.0000 | INHALATION_SPRAY | Freq: Every day | RESPIRATORY_TRACT | 5 refills | Status: DC

## 2017-05-11 NOTE — Telephone Encounter (Signed)
-----  Message from Star Age, MD sent at 05/11/2017  8:25 AM EDT ----- Patient referred by Dr. Lake Bells in pulm, seen by me on 04/21/17, diagnostic PSG on 05/10/17.   Please call and notify the patient that the recent sleep study did not show any significant obstructive sleep apnea. Intermittent mild to moderate snoring was noted. For disturbing snoring, an oral appliance (through a qualified dentist) can be considered.  The study does demonstrate oxygen desaturations during sleep, particularly REM sleep and confirms patient's nocturnal need for oxygen supplementation. He is advised to continue his supplemental oxygen at home as directed by his lung doctor.  Please inform patient that he can at this point follow up with his lung doctor as planned and PCP as planned. His EKG showe the occasional PVC (extra beat) which is a common finding and otherwise EKG was benign. He can certainly bring this up with PCP at his next appointment. Also, route or fax report to PCP and referring MD, if other than PCP.  Once you have spoken to patient, you can close this encounter.   Thanks,  Star Age, MD, PhD [facility])

## 2017-05-11 NOTE — Telephone Encounter (Signed)
I called pt to discuss his sleep study results. No answer, left a message asking him to call me back.

## 2017-05-11 NOTE — Progress Notes (Signed)
Patient referred by Dr. Lake Bells in pulm, seen by me on 04/21/17, diagnostic PSG on 05/10/17.   Please call and notify the patient that the recent sleep study did not show any significant obstructive sleep apnea. Intermittent mild to moderate snoring was noted. For disturbing snoring, an oral appliance (through a qualified dentist) can be considered.  The study does demonstrate oxygen desaturations during sleep, particularly REM sleep and confirms patient's nocturnal need for oxygen supplementation. He is advised to continue his supplemental oxygen at home as directed by his lung doctor.  Please inform patient that he can at this point follow up with his lung doctor as planned and PCP as planned. His EKG showe the occasional PVC (extra beat) which is a common finding and otherwise EKG was benign. He can certainly bring this up with PCP at his next appointment. Also, route or fax report to PCP and referring MD, if other than PCP.  Once you have spoken to patient, you can close this encounter.   Thanks,  Star Age, MD, PhD [facility])

## 2017-05-11 NOTE — Progress Notes (Signed)
Daily Session Note  Patient Details  Name: [name] MRN: [id number] Date of Birth: [birth year] Referring Provider:     Pulmonary Rehab Walk Test from 03/23/2017 in [place]  Referring Provider  Dr. Haroldine Laws      Encounter Date: 05/11/2017  Check In:     Session Check In - 05/11/17 1232      Check-In   Location [facility]   Staff Present Su Hilt, MS, ACSM RCEP, Exercise Physiologist;Maria Whitaker, RN, BSN;Joann Rion, RN, Luisa Hart, RN, BSN   Supervising physician immediately available to respond to emergencies [facility] immediately available   Physician(s) Dr. Bonner Puna   Medication changes reported     No   Fall or balance concerns reported    No   Tobacco Cessation No Change   Warm-up and Cool-down Performed as group-led instruction   Resistance Training Performed Yes   VAD Patient? No     Pain Assessment   Currently in Pain? No/denies   Multiple Pain Sites No      Capillary Blood Glucose: No results found for this or any previous visit (from the past 24 hour(s)).      Exercise Prescription Changes - 05/11/17 1200      Response to Exercise   Blood Pressure (Admit) 96/62   Blood Pressure (Exercise) 120/64   Blood Pressure (Exit) 106/60   Heart Rate (Admit) 67 bpm   Heart Rate (Exercise) 92 bpm   Heart Rate (Exit) 65 bpm   Oxygen Saturation (Admit) 92 %   Oxygen Saturation (Exercise) 84 %  O2 increased to 15L sat up to 88%   Oxygen Saturation (Exit) 96 %   Rating of Perceived Exertion (Exercise) 15   Perceived Dyspnea (Exercise) 2   Duration Continue with 45 min of aerobic exercise without signs/symptoms of physical distress.   Intensity THRR unchanged     Progression   Progression Continue to progress workloads to maintain intensity without signs/symptoms of physical distress.     Resistance Training   Training Prescription Yes   Weight orange bands   Reps 10-15   Time 10  Minutes     Oxygen   Oxygen Continuous   Liters 15     Treadmill   MPH 2.4   Grade 3   Minutes 17     NuStep   Level 2   Minutes 17   METs 2.5      History  Smoking Status  . Former Smoker  . Packs/day: 1.00  . Years: 18.00  . Quit date: 09/27/1985  Smokeless Tobacco  . Never Used    Goals Met:  Exercise tolerated well No report of cardiac concerns or symptoms Strength training completed today  Goals Unmet:  Not Applicable  Comments: Service time is from 10:30a to 12:30p    Dr. Rush Farmer is Medical Director for Pulmonary Rehab at [facility].

## 2017-05-11 NOTE — Procedures (Signed)
PATIENT'S NAMEVale, [name] DOB:      1946-06-20      MR#:    [id number]     DATE OF RECORDING: 05/10/2017 REFERRING M.D.:  Lawerance Cruel, MD Study Performed:   Baseline Polysomnogram HISTORY:  71 year old man with a history of coronary artery disease, COPD/emphysema with chronic oxygen dependence, anxiety disorder, hyperlipidemia, prior smoking, and history of Raynaud's, who reports snoring and oxygen desaturations during sleep. He has been on oxygen 24-7 for the past few months. The patient endorsed the Epworth Sleepiness Scale at 1 points. The patient's weight 177 pounds with a height of 72 (inches), resulting in a BMI of 23.9 kg/m2. The patient's neck circumference measured 15.5 inches.  CURRENT MEDICATIONS: Klonopin, Co Q 10, Benadryl, Zetia, Acidophilus, Opsumit, Omega 3, Crestor, Anoro Ellipta, Vitamin B12.    PROCEDURE:  This is a multichannel digital polysomnogram utilizing the Somnostar 11.2 system.  Electrodes and sensors were applied and monitored per AASM Specifications.   EEG, EOG, Chin and Limb EMG, were sampled at 200 Hz.  ECG, Snore and Nasal Pressure, Thermal Airflow, Respiratory Effort, CPAP Flow and Pressure, Oximetry was sampled at 50 Hz. Digital video and audio were recorded.      BASELINE STUDY The study was started with his supplemental O2 at 1 lpm, as patient was not comfortable starting his study without additional oxygen. An earlobe probe was used for oxygen sensor, rather than finger probe, due to his history of Raynaud's.    Lights Out was at 22:42 and Lights On at 05:00. Total recording time (TRT) was 378.5 minutes, with a total sleep time (TST) of  286 minutes.   The patient's sleep latency was 54.5 minutes, which is delayed. REM latency was 171.5 minutes, which is delayed. The sleep efficiency was 75.6%, which is reduced.     SLEEP ARCHITECTURE: WASO (Wake after sleep onset) was 26 minutes with overall little sleep fragmentation noted, but inability to return  to sleep after 04:23. There were 11.5 minutes in Stage N1, 231 minutes Stage N2, 19 minutes Stage N3 and 24.5 minutes in Stage REM.  The percentage of Stage N1 was 4.%, Stage N2 was 80.8%, which is markedly increased, Stage N3 was 6.6% and Stage R (REM sleep) was 8.6%, which is reduced. The arousals were noted as: 39 were spontaneous, 0 were associated with PLMs, 3 were associated with respiratory events.  Audio and video analysis did not show any abnormal or unusual movements, behaviors, phonations or vocalizations. The patient took no bathroom breaks. Mild intermittent and rare moderate snoring was noted. The EKG showed occasional PVCs.  RESPIRATORY ANALYSIS:  There were a total of 4 respiratory events:  2 obstructive apneas, 0 central apneas and 0 mixed apneas with a total of 2 apneas and an apnea index (AI) of .4 /hour. There were 2 hypopneas with a hypopnea index of .4 /hour. The patient also had 0 respiratory event related arousals (RERAs).      The total APNEA/HYPOPNEA INDEX (AHI) was .8/hour and the total RESPIRATORY DISTURBANCE INDEX was .8 /hour.  0 events occurred in REM sleep and 4 events in NREM. The REM AHI was 0 /hour, versus a non-REM AHI of .9. The patient spent 0 minutes of total sleep time in the supine position and 286 minutes in non-supine.. The supine AHI was 0.0 versus a non-supine AHI of 0.8.  OXYGEN SATURATION & C02:  The Wake baseline 02 saturation was 96%, during sleep and on O2 at 1 lpm,  his average O2 saturations were around 91%. At 01:09, the supplemental oxygen was turned off to see how his saturations would respond: his O2 sats drifted to 83% and stayed in the mid to higher 80s without any OSA events unmasked during that time. He sleep on his right side during that time and during REM sleep his O2 saturations drifted below 80% slowly, nadir of 77% during non-supine/prone REM sleep. When he turned to prone position in NREM sleep, his O2 saturations improved to average 93%  without additional O2 supplementation. Time spent below 89% saturation equaled 145 minutes.  PERIODIC LIMB MOVEMENTS: The patient had a total of 0 Periodic Limb Movements.  The Periodic Limb Movement (PLM) index was 0 and the PLM Arousal index was 0/hour.  Post-study, the patient indicated that sleep was the same as usual.    IMPRESSION:  1. Oxygen desaturations during sleep 2. Oxygen dependence 3. Primary Snoring 4. Abnormal EKG 5. Dysfunctions associated with sleep stages or arousal from sleep  RECOMMENDATIONS:  1. This study does not demonstrate any significant obstructive or central sleep disordered breathing. Intermittent mild to moderate snoring was noted. For disturbing snoring, an oral appliance (through a qualified dentist) can be considered.  2. This study does demonstrate oxygen desaturations during sleep, particularly REM sleep and confirms patient's nocturnal need for oxygen supplementation. He will be advised to continue his supplemental oxygen at home.  3. This study shows sleep fragmentation and abnormal sleep stage percentages; these are nonspecific findings and per se do not signify an intrinsic sleep disorder or a cause for the patient's sleep-related symptoms. Causes include (but are not limited to) the first night effect of the sleep study, circadian rhythm disturbances, medication effect or an underlying mood disorder or medical problem.  4. The patient should be cautioned not to drive, work at heights, or operate dangerous or heavy equipment when tired or sleepy. Review and reiteration of good sleep hygiene measures should be pursued with any patient. 5. The study showed occasional PVCs on single lead EKG; clinical correlation is recommended and consultation with cardiology may be feasible.  6. The patient can follow-up with his referring provider, who will be notified of the test results. I certify that I have reviewed the entire raw data recording prior to the issuance  of this report in accordance with the Standards of Accreditation of the American Academy of Sleep Medicine (AASM)   Star Age, MD, PhD Diplomat, American Board of Psychiatry and Neurology (Neurology and Sleep Medicine)

## 2017-05-12 NOTE — Telephone Encounter (Signed)
Pt returned my call. I advised him that his sleep study did not show any significant osa. Pt did have intermittent mild to moderate snoring. For snoring treatment, an oral appliance, made by a qualified dentist, can be considered. I advised him that his sleep study did demonstrate O2 desats during sleep, particularly during REM sleep, and pt should continue nocturnal O2 as directed by lung doctor. I advised him that he can follow up with his lung doctor and PCP at this point. I advised him that his EKG showed the occasional PVC, which is a common finding, otherwise EKG was benign, and can discuss with PCP. Pt asked that I send his sleep study to Dr. Harrington Challenger and Dr. Lake Bells. Pt verbalized understanding of results. Pt had no questions at this time but was encouraged to call back if questions arise.

## 2017-05-13 ENCOUNTER — Encounter (HOSPITAL_COMMUNITY)
Admission: RE | Admit: 2017-05-13 | Discharge: 2017-05-13 | Disposition: A | Discharge status: Home / Self Care | Payer: Medicare Other | Source: Ambulatory Visit | Attending: Internal Medicine | Admitting: Internal Medicine

## 2017-05-13 VITALS — Wt 177.7 lb

## 2017-05-13 DIAGNOSIS — I272 Pulmonary hypertension, unspecified: Secondary | ICD-10-CM

## 2017-05-13 NOTE — Progress Notes (Signed)
Daily Session Note  Patient Details  Name: [name] MRN: [id number] Date of Birth: 03-14-1946 Referring Provider:     Pulmonary Rehab Walk Test from 03/23/2017 in [place]  Referring Provider  Dr. Haroldine Laws      Encounter Date: 05/13/2017  Check In:     Session Check In - 05/13/17 1203      Check-In   Location [facility]   Staff Present Su Hilt, MS, ACSM RCEP, Exercise Physiologist;Dawson Albers Rollene Rotunda, RN, BSN   Supervising physician immediately available to respond to emergencies [facility] immediately available   Physician(s) Dr. Carolin Sicks   Medication changes reported     No   Fall or balance concerns reported    No   Tobacco Cessation No Change   Warm-up and Cool-down Performed as group-led instruction   Resistance Training Performed Yes   VAD Patient? No     Pain Assessment   Currently in Pain? No/denies   Multiple Pain Sites No      Capillary Blood Glucose: No results found for this or any previous visit (from the past 24 hour(s)).      Exercise Prescription Changes - 05/13/17 1209      Response to Exercise   Blood Pressure (Admit) 90/58   Blood Pressure (Exercise) 142/62   Blood Pressure (Exit) 116/68   Heart Rate (Admit) 68 bpm   Heart Rate (Exercise) 106 bpm   Heart Rate (Exit) 76 bpm   Oxygen Saturation (Admit) 90 %   Oxygen Saturation (Exercise) 74 %  o2 increased to 25L and patient pulled off [employer] bike   Oxygen Saturation (Exit) 92 %   Rating of Perceived Exertion (Exercise) 15   Perceived Dyspnea (Exercise) 3   Duration Continue with 45 min of aerobic exercise without signs/symptoms of physical distress.   Intensity THRR unchanged     Progression   Progression Continue to progress workloads to maintain intensity without signs/symptoms of physical distress.     Resistance Training   Training Prescription Yes   Weight orange bands   Reps 10-15   Time 10 Minutes     Oxygen    Oxygen Continuous   Liters 15     Treadmill   MPH 2.4   Grade 3   Minutes 17     Bike   Level 1.4     NuStep   Level 4   Minutes 17   METs 2.7      History  Smoking Status  . Former Smoker  . Packs/day: 1.00  . Years: 18.00  . Quit date: 09/27/1985  Smokeless Tobacco  . Never Used    Goals Met:  Independence with exercise equipment Queuing for purse lip breathing No report of cardiac concerns or symptoms Strength training completed today  Goals Unmet:  O2 Sat  Comments: Service time is from 1030 to 1200   Dr. Rush Farmer is Medical Director for Pulmonary Rehab at [facility].

## 2017-05-18 ENCOUNTER — Encounter (HOSPITAL_COMMUNITY)
Admission: RE | Admit: 2017-05-18 | Discharge: 2017-05-18 | Disposition: A | Discharge status: Home / Self Care | Payer: Medicare Other | Source: Ambulatory Visit | Attending: Internal Medicine | Admitting: Internal Medicine

## 2017-05-18 VITALS — Wt 177.2 lb

## 2017-05-18 DIAGNOSIS — I272 Pulmonary hypertension, unspecified: Secondary | ICD-10-CM | POA: Diagnosis not present

## 2017-05-18 NOTE — Progress Notes (Signed)
Daily Session Note  Patient Details  Name: [name] MRN: [id number] Date of Birth: Jun 18, 1946 Referring Provider:     Pulmonary Rehab Walk Test from 03/23/2017 in [place]  Referring Provider  Dr. Haroldine Laws      Encounter Date: 05/18/2017  Check In:     Session Check In - 05/18/17 1030      Check-In   Location [facility]   Staff Present Rodney Langton, RN;Avory Mimbs, MS, ACSM RCEP, Exercise Physiologist;Portia Rollene Rotunda, RN, BSN   Supervising physician immediately available to respond to emergencies [facility] immediately available   Physician(s) Dr. Carolin Sicks   Medication changes reported     No   Fall or balance concerns reported    No   Tobacco Cessation No Change   Warm-up and Cool-down Performed as group-led instruction   Resistance Training Performed Yes   VAD Patient? No     Pain Assessment   Currently in Pain? No/denies   Multiple Pain Sites No      Capillary Blood Glucose: No results found for this or any previous visit (from the past 24 hour(s)).      Exercise Prescription Changes - 05/18/17 1600      Response to Exercise   Blood Pressure (Admit) 116/60   Blood Pressure (Exercise) 152/72   Blood Pressure (Exit) 120/60   Heart Rate (Admit) 61 bpm   Heart Rate (Exercise) 102 bpm   Heart Rate (Exit) 72 bpm   Oxygen Saturation (Admit) 99 %   Oxygen Saturation (Exercise) 85 %   Oxygen Saturation (Exit) 96 %   Rating of Perceived Exertion (Exercise) 13   Perceived Dyspnea (Exercise) 3   Duration Continue with 45 min of aerobic exercise without signs/symptoms of physical distress.   Intensity THRR unchanged     Progression   Progression Continue to progress workloads to maintain intensity without signs/symptoms of physical distress.     Resistance Training   Training Prescription Yes   Weight orange bands   Reps 10-15   Time 10 Minutes     Oxygen   Oxygen Continuous   Liters 15     Treadmill   MPH 2.4   Grade 3   Minutes 17     Bike   Level 1.4     NuStep   Level 5   Minutes 17   METs 2.9      History  Smoking Status  . Former Smoker  . Packs/day: 1.00  . Years: 18.00  . Quit date: 09/27/1985  Smokeless Tobacco  . Never Used    Goals Met:  Exercise tolerated well No report of cardiac concerns or symptoms Strength training completed today  Goals Unmet:  Not Applicable  Comments: Service time is from 10:30 to 12:00p    Dr. Rush Farmer is Medical Director for Pulmonary Rehab at [facility].

## 2017-05-20 ENCOUNTER — Encounter (HOSPITAL_COMMUNITY)
Admission: RE | Admit: 2017-05-20 | Discharge: 2017-05-20 | Disposition: A | Discharge status: Home / Self Care | Payer: Medicare Other | Source: Ambulatory Visit | Attending: Internal Medicine | Admitting: Internal Medicine

## 2017-05-20 VITALS — Wt 176.8 lb

## 2017-05-20 DIAGNOSIS — I272 Pulmonary hypertension, unspecified: Secondary | ICD-10-CM

## 2017-05-20 NOTE — Progress Notes (Signed)
Daily Session Note  Patient Details  Name: [name] MRN: [id number] Date of Birth: [birth year] Referring Provider:     Pulmonary Rehab Walk Test from 03/23/2017 in [place]  Referring Provider  Dr. Haroldine Laws      Encounter Date: 05/20/2017  Check In:     Session Check In - 05/20/17 1058      Check-In   Location [facility]   Staff Present Su Hilt, MS, ACSM RCEP, Exercise Physiologist;Portia Rollene Rotunda, RN, BSN   Supervising physician immediately available to respond to emergencies [facility] immediately available   Physician(s) Dr. Wynelle Cleveland   Medication changes reported     No   Fall or balance concerns reported    No   Tobacco Cessation No Change   Warm-up and Cool-down Performed as group-led instruction   Resistance Training Performed Yes   VAD Patient? No     Pain Assessment   Currently in Pain? No/denies   Multiple Pain Sites No      Capillary Blood Glucose: No results found for this or any previous visit (from the past 24 hour(s)).      Exercise Prescription Changes - 05/20/17 1200      Response to Exercise   Blood Pressure (Admit) 104/60   Blood Pressure (Exercise) 134/66   Blood Pressure (Exit) 106/60   Heart Rate (Admit) 76 bpm   Heart Rate (Exercise) 108 bpm   Heart Rate (Exit) 65 bpm   Oxygen Saturation (Admit) 96 %   Oxygen Saturation (Exercise) 86 %  15 liters moved to 25liters   Oxygen Saturation (Exit) 90 %   Rating of Perceived Exertion (Exercise) 13   Perceived Dyspnea (Exercise) 2   Duration Continue with 45 min of aerobic exercise without signs/symptoms of physical distress.   Intensity THRR unchanged     Progression   Progression Continue to progress workloads to maintain intensity without signs/symptoms of physical distress.     Resistance Training   Training Prescription Yes   Weight orange bands   Reps 10-15   Time 10 Minutes     Oxygen   Oxygen Continuous   Liters  25     Bike   Level 1.4   Minutes 17     NuStep   Level 5   Minutes 17   METs 2.5      History  Smoking Status  . Former Smoker  . Packs/day: 1.00  . Years: 18.00  . Quit date: 09/27/1985  Smokeless Tobacco  . Never Used    Goals Met:  Exercise tolerated well No report of cardiac concerns or symptoms Strength training completed today  Goals Unmet:  Not Applicable  Comments: Service time is from 10:30a to 12:20p    Dr. Rush Farmer is Medical Director for Pulmonary Rehab at [facility].

## 2017-05-21 NOTE — Progress Notes (Signed)
Pulmonary Individual Treatment Plan  Patient Details  Name: [name] MRN: [id number] Date of Birth: [birth year] Referring Provider:     Pulmonary Rehab Walk Test from 03/23/2017 in [facility]  Referring Provider  Dr. Haroldine Laws      Initial Encounter Date:    Pulmonary Rehab Walk Test from 03/23/2017 in [place]  Date  03/25/17  Referring Provider  Dr. Haroldine Laws      Visit Diagnosis: Pulmonary hypertension ([place])  Patient's Home Medications on Admission:   Current Outpatient Prescriptions:  .  aspirin 81 MG tablet, Take 81 mg by mouth every evening. , Disp: , Rfl:  .  clonazePAM (KLONOPIN) 0.5 MG tablet, Take 0.5 mg by mouth 2 (two) times daily as needed for anxiety. , Disp: , Rfl: 0 .  Coenzyme Q10 (VITALINE COQ10) 60 MG TABS, Take 60 mg by mouth daily., Disp: , Rfl:  .  diphenhydrAMINE (BENADRYL) 12.5 MG/5ML liquid, Take 25 mg by mouth at bedtime as needed for sleep., Disp: , Rfl:  .  ezetimibe (ZETIA) 10 MG tablet, Take 1 tablet (10 mg total) by mouth every evening., Disp: 90 tablet, Rfl: 3 .  Fluticasone-Umeclidin-Vilant (TRELEGY ELLIPTA) 100-62.5-25 MCG/INH AEPB, Inhale 1 puff into the lungs daily., Disp: 60 each, Rfl: 5 .  ibuprofen (ADVIL,MOTRIN) 200 MG tablet, Take 400 mg by mouth 2 (two) times daily as needed for headache or moderate pain., Disp: , Rfl:  .  Lactobacillus (ACIDOPHILUS PO), Take 1 capsule by mouth daily. , Disp: , Rfl:  .  Macitentan (OPSUMIT) 10 MG TABS, Take 1 tablet (10 mg total) by mouth daily., Disp: 30 tablet, Rfl: 11 .  Multiple Vitamins-Minerals (CENTRUM CARDIO PO), Take 1 tablet by mouth daily. , Disp: , Rfl:  .  Omega-3 Fatty Acids (OMEGA 3 PO), Take 3,200 mg by mouth 2 (two) times daily. Nord[employer] Natural ultimate Omega 3 , Disp: , Rfl:  .  rosuvastatin (CRESTOR) 40 MG tablet, Take 1 tablet (40 mg total) by mouth daily., Disp: 90 tablet, Rfl: 3 .  umeclidinium-vilanterol (ANORO  ELLIPTA) 62.5-25 MCG/INH AEPB, Inhale 1 puff into the lungs daily., Disp: 60 each, Rfl: 5 .  vitamin B-12 (CYANOCOBALAMIN) 1000 MCG tablet, Take 1,000 mcg by mouth daily., Disp: , Rfl:   Past Medical History: Past Medical History:  Diagnosis Date  . CAD (coronary artery disease)   . GAD (generalized anxiety disorder)   . Hyperlipidemia   . Overweight(278.02)   . Pulmonary hypertension ([facility])     Tobacco Use: History  Smoking Status  . Former Smoker  . Packs/day: 1.00  . Years: 18.00  . Quit date: 09/27/1985  Smokeless Tobacco  . Never Used    Labs: Recent Review Flowsheet Data    Labs for ITP Cardiac and Pulmonary Rehab Latest Ref Rng & Units 11/25/2015 06/12/2016 11/24/2016 11/24/2016 12/02/2016   Cholestrol <200 mg/dL 118(L) 101 - - -   LDLCALC <100 mg/dL 50 47 - - -   HDL >40 mg/dL 37(L) 37(L) - - -   Trlycerides <150 mg/dL 155(H) 86 - - -   PHART 7.350 - 7.450 - - 7.416 - -   PCO2ART 32.0 - 48.0 mmHg - - 32.2 - -   HCO3 20.0 - 28.0 mmol/L - - 20.7 21.9 -   TCO2 0 - 100 mmol/L - - _0 ACIDBASEDEF 0.0 - 2.0 mmol/L - - 3.0(H) 2.0 -   O2SAT % - - 90.0 67.0 -  Capillary Blood Glucose: No results found for: GLUCAP   Pulmonary Assessment Scores:     Pulmonary Assessment Scores    Row Name 03/25/17 0700 03/31/17 0825       ADL UCSD   ADL Phase Entry Entry    SOB Score total  - 35      CAT Score   CAT Score  - 10  Entry      mMRC Score   mMRC Score 2  -       Pulmonary Function Assessment:   Exercise Target Goals:    Exercise Program Goal: Individual exercise prescription set with THRR, safety & activity barriers. Participant demonstrates ability to understand and report RPE using BORG scale, to self-measure pulse accurately, and to acknowledge the importance of the exercise prescription.  Exercise Prescription Goal: Starting with aerobic activity 30 plus minutes a day, 3 days per week for initial exercise prescription. Provide home exercise  prescription and guidelines that participant acknowledges understanding prior to discharge.  Activity Barriers & Risk Stratification:   6 Minute Walk:     6 Minute Walk    Row Name 03/23/17 1712         6 Minute Walk   Phase Initial     Distance 853 feet     Walk Time 5.45 minutes     # of Rest Breaks 1     MPH 1.78     METS 1.43     RPE 11     Perceived Dyspnea  1     VO2 Peak 5.01     Symptoms No     Resting HR 64 bpm     Resting BP 110/70     Max Ex. HR 73 bpm     Max Ex. BP 106/70     2 Minute Post BP 98/64       Interval HR   Baseline HR (retired) 92     1 Minute HR 71     2 Minute HR 68     3 Minute HR 54     4 Minute HR 60     5 Minute HR 73     6 Minute HR 71     2 Minute Post HR 55     Interval Heart Rate? Yes       Interval Oxygen   Interval Oxygen? Yes     Baseline Oxygen Saturation % 92 %     Resting Liters of Oxygen 2 L     1 Minute Oxygen Saturation % 85 %  increased to 87      1 Minute Liters of Oxygen 2 L     2 Minute Oxygen Saturation % 86 %  short rest break 15 seconds, incr. O2-->91%     2 Minute Liters of Oxygen 4 L     3 Minute Oxygen Saturation % 92 %     3 Minute Liters of Oxygen 6 L     4 Minute Oxygen Saturation % 91 %     4 Minute Liters of Oxygen 6 L     5 Minute Oxygen Saturation % 90 %     5 Minute Liters of Oxygen 6 L     6 Minute Oxygen Saturation % 90 %     6 Minute Liters of Oxygen 6 L     2 Minute Post Oxygen Saturation % 96 %     2 Minute Post Liters of Oxygen 3 L  Oxygen Initial Assessment:     Oxygen Initial Assessment - 03/25/17 0700      Initial 6 min Walk   Oxygen Used Continuous;E-Tanks   Liters per minute 6   Resting Oxygen Saturation  92 %  2liters   Exercise Oxygen Saturation  during 6 min walk 85 %  85-87% 2 liters 90% on 6 liters     Program Oxygen Prescription   Program Oxygen Prescription Continuous;E-Tanks   Liters per minute 6      Oxygen Re-Evaluation:     Oxygen  Re-Evaluation    Row Name 04/28/17 1721 05/21/17 1209           Program Oxygen Prescription   Program Oxygen Prescription Continuous;E-Tanks Continuous;E-Tanks      Liters per minute 15 25      Comments order recieved to maintain saturations>85% order recieved to maintain saturations>85%        Home Oxygen   Home Oxygen Device E-Tanks;Home Concentrator E-Tanks;Home Concentrator      Sleep Oxygen Prescription Continuous Continuous      Liters per minute 2 2      Home Exercise Oxygen Prescription Continuous Continuous      Liters per minute -  home exercise not complete until it is determined that patient has oxygen source sufficient to maintain high literflow  -      Home at Rest Exercise Oxygen Prescription Continuous Continuous      Liters per minute 2.5 2      Compliance with Home Oxygen Use Yes Yes        Goals/Expected Outcomes   Short Term Goals To learn and exhibit compliance with exercise, home and travel O2 prescription;To learn and understand importance of monitoring SPO2 with pulse oximeter and demonstrate accurate use of the pulse oximeter.;To learn and understand importance of maintaining oxygen saturations>88%;To learn and demonstrate proper pursed lip breathing techniques or other breathing techniques.;To learn and demonstrate proper use of respiratory medications To learn and exhibit compliance with exercise, home and travel O2 prescription;To learn and understand importance of monitoring SPO2 with pulse oximeter and demonstrate accurate use of the pulse oximeter.;To learn and understand importance of maintaining oxygen saturations>88%;To learn and demonstrate proper pursed lip breathing techniques or other breathing techniques.;To learn and demonstrate proper use of respiratory medications      Long  Term Goals Exhibits compliance with exercise, home and travel O2 prescription;Verbalizes importance of monitoring SPO2 with pulse oximeter and return demonstration;Maintenance  of O2 saturations>88%;Exhibits proper breathing techniques, such as pursed lip breathing or other method taught during program session;Compliance with respiratory medication Exhibits compliance with exercise, home and travel O2 prescription;Verbalizes importance of monitoring SPO2 with pulse oximeter and return demonstration;Maintenance of O2 saturations>88%;Exhibits proper breathing techniques, such as pursed lip breathing or other method taught during program session;Compliance with respiratory medication      Comments  - patient has appointment with pulmonologist Sept 26 to discuss increased need for oxygen      Goals/Expected Outcomes patient will verbalize home oxygen compliance patient will verbalize home oxygen compliance         Oxygen Discharge (Final Oxygen Re-Evaluation):     Oxygen Re-Evaluation - 05/21/17 1209      Program Oxygen Prescription   Program Oxygen Prescription Continuous;E-Tanks   Liters per minute 25   Comments order recieved to maintain saturations>85%     Home Oxygen   Home Oxygen Device E-Tanks;Home Concentrator   Sleep Oxygen Prescription Continuous   Liters per minute 2  Home Exercise Oxygen Prescription Continuous   Home at Rest Exercise Oxygen Prescription Continuous   Liters per minute 2   Compliance with Home Oxygen Use Yes     Goals/Expected Outcomes   Short Term Goals To learn and exhibit compliance with exercise, home and travel O2 prescription;To learn and understand importance of monitoring SPO2 with pulse oximeter and demonstrate accurate use of the pulse oximeter.;To learn and understand importance of maintaining oxygen saturations>88%;To learn and demonstrate proper pursed lip breathing techniques or other breathing techniques.;To learn and demonstrate proper use of respiratory medications   Long  Term Goals Exhibits compliance with exercise, home and travel O2 prescription;Verbalizes importance of monitoring SPO2 with pulse oximeter and  return demonstration;Maintenance of O2 saturations>88%;Exhibits proper breathing techniques, such as pursed lip breathing or other method taught during program session;Compliance with respiratory medication   Comments patient has appointment with pulmonologist Sept 26 to discuss increased need for oxygen   Goals/Expected Outcomes patient will verbalize home oxygen compliance      Initial Exercise Prescription:     Initial Exercise Prescription - 03/25/17 0700      Date of Initial Exercise RX and Referring Provider   Date 03/25/17   Referring Provider Dr. Haroldine Laws     Oxygen   Oxygen Continuous   Liters 6     Bike   Level 0.5   Minutes 17     NuStep   Level 2   Minutes 17   METs 1.5     Track   Laps 7   Minutes 17     Prescription Details   Frequency (times per week) 2   Duration Progress to 45 minutes of aerobic exercise without signs/symptoms of physical distress     Intensity   THRR 40-80% of Max Heartrate 60-119   Ratings of Perceived Exertion 11-13   Perceived Dyspnea 0-4     Progression   Progression Continue progressive overload as per policy without signs/symptoms or physical distress.     Resistance Training   Training Prescription Yes   Weight orange bands   Reps 10-15      Perform Capillary Blood Glucose checks as needed.  Exercise Prescription Changes:     Exercise Prescription Changes    Row Name 03/30/17 1200 04/01/17 1359 04/06/17 1200 04/08/17 1200 04/13/17 1200     Response to Exercise   Blood Pressure (Admit) 118/70 100/64 92/52 112/66 100/58   Blood Pressure (Exercise) 122/70  - 132/62 120/58 144/60   Blood Pressure (Exit) 96/62 114/62 98/64 105/69 104/60   Heart Rate (Admit) 59 bpm 55 bpm 66 bpm 60 bpm 59 bpm   Heart Rate (Exercise) 86 bpm 88 bpm 92 bpm 85 bpm 87 bpm   Heart Rate (Exit) 62 bpm 53 bpm 59 bpm 61 bpm 60 bpm   Oxygen Saturation (Admit) 98 % 98 % 96 % 92 % 96 %   Oxygen Saturation (Exercise) 84 % 84 % 87 % 86 % 85 %   with forehead probe   Oxygen Saturation (Exit) 94 % 94 % 93 % 93 % 99 %   Rating of Perceived Exertion (Exercise) _0 Perceived Dyspnea (Exercise) _1 0   Duration Continue with 45 min of aerobic exercise without signs/symptoms of physical distress. Continue with 45 min of aerobic exercise without signs/symptoms of physical distress. Continue with 45 min of aerobic exercise without signs/symptoms of physical distress. Continue with 45 min of aerobic exercise without signs/symptoms  of physical distress. Continue with 45 min of aerobic exercise without signs/symptoms of physical distress.   Intensity _0      Progression   Progression Continue to progress workloads to maintain intensity without signs/symptoms of physical distress. Continue to progress workloads to maintain intensity without signs/symptoms of physical distress. Continue to progress workloads to maintain intensity without signs/symptoms of physical distress. Continue to progress workloads to maintain intensity without signs/symptoms of physical distress. Continue to progress workloads to maintain intensity without signs/symptoms of physical distress.     Resistance Training   Training Prescription _1    Weight _2    Reps 10-15 10-15 10-15 10-15 10-15   Time 10 Minutes 10 Minutes 10 Minutes 10 Minutes 10 Minutes     Oxygen   Oxygen _3    Liters _4 8-10 8-10     Treadmill   MPH 1.6 2 2.3  - 2.2   Grade 0 0 2  - 0   Minutes _5 - 17     Bike   Level 0.5  - 0.6 0.6 0.8   Minutes 17  - _6 NuStep   Level 2  - _7 Minutes 17  - _8 METs 1.7  - 1.8 1.9 1.7   Row Name 04/15/17 1230 04/20/17 1200 04/22/17 1245 04/27/17 1231 04/29/17 1248     Response to Exercise   Blood Pressure  (Admit) 100/66 100/60 1_9  reck 110/58   Blood Pressure (Exercise) 116/62 106/60 104/81 144/68 166/72   Blood Pressure (Exit) 98/50 104/70 112/60 100/66 108/64   Heart Rate (Admit) 60 bpm 61 bpm 57 bpm 55 bpm 64 bpm   Heart Rate (Exercise) 82 bpm 89 bpm 89 bpm 111 bpm 101 bpm   Heart Rate (Exit) 58 bpm 63 bpm 63 bpm 68 bpm 66 bpm   Oxygen Saturation (Admit) 97 % 99 % 98 % 98 % 96 %  2 liters   Oxygen Saturation (Exercise) 87 %  with forehead probe 76 %  Sat on TM down O2 on 6L increased to 10L. Oxymizer placed. 83 %  Sat on TM down O2 on 6L increased to 10L. Oxymizer placed. 86 %  15 L 84 %  10L INCREASED TO 15L   Oxygen Saturation (Exit) 95 % 95 % 95 % 92 % 96 %  2 liters   Rating of Perceived Exertion (Exercise) _10 Perceived Dyspnea (Exercise) 0 _11 Duration Continue with 45 min of aerobic exercise without signs/symptoms of physical distress. Continue with 45 min of aerobic exercise without signs/symptoms of physical distress. Continue with 45 min of aerobic exercise without signs/symptoms of physical distress. Continue with 45 min of aerobic exercise without signs/symptoms of physical distress. Continue with 45 min of aerobic exercise without signs/symptoms of physical distress.   Intensity _12      Progression   Progression Continue to progress workloads to maintain intensity without signs/symptoms of physical distress. Continue to progress workloads to maintain intensity without signs/symptoms of physical distress. Continue to progress workloads to maintain intensity without signs/symptoms of physical distress. Continue to progress workloads to maintain intensity without signs/symptoms of physical distress. Continue to progress workloads  to maintain intensity without signs/symptoms of physical distress.     Resistance Training   Training Prescription _0    Weight  _1    Reps 10-15 10-15 10-15 10-15 10-15   Time 10 Minutes 10 Minutes 10 Minutes 10 Minutes 10 Minutes     Oxygen   Oxygen _2    Liters 8-10 8-10 8-_3 Treadmill   MPH 2.2 2.1 2.1 2.4  -   Grade 0 0 0 3  -   Minutes _4 -     Bike   Level 0.8 0.8 1 1.4 1  decreased workload per patients request. still desatuates   Minutes _5 NuStep   Level 3 4  - 4 4   Minutes 17 17  - 17 17   METs 1.7 1.8  - 2.8 2.8   Row Name 05/04/17 1200 05/06/17 1200 05/11/17 1200 05/13/17 1209 05/18/17 1600     Response to Exercise   Blood Pressure (Admit) _6 90/58 116/60   Blood Pressure (Exercise)  - 110/62 120/64 142/62 152/72   Blood Pressure (Exit) 100/50 98/58 106/60 116/68 120/60   Heart Rate (Admit) 62 bpm 59 bpm 67 bpm 68 bpm 61 bpm   Heart Rate (Exercise) 103 bpm 101 bpm 92 bpm 106 bpm 102 bpm   Heart Rate (Exit) 66 bpm 69 bpm 65 bpm 76 bpm 72 bpm   Oxygen Saturation (Admit) 99 % 93 % 92 % 90 % 99 %   Oxygen Saturation (Exercise) 81 %  O2 increased to 15 L sat up to 88 83 %  O2 increased to 15L sat up to 88% 84 %  O2 increased to 15L sat up to 88% 74 %  o2 increased to 25L and patient pulled off [employer] bike 85 %   Oxygen Saturation (Exit) 95 % 97 % 96 % 92 % 96 %   Rating of Perceived Exertion (Exercise) _7 Perceived Dyspnea (Exercise) _8 Duration Continue with 45 min of aerobic exercise without signs/symptoms of physical distress. Continue with 45 min of aerobic exercise without signs/symptoms of physical distress. Continue with 45 min of aerobic exercise without signs/symptoms of physical distress. Continue with 45 min of aerobic exercise without signs/symptoms of physical distress. Continue with 45 min of aerobic exercise without signs/symptoms of physical distress.   Intensity THRR unchanged THRR unchanged  THRR unchanged THRR unchanged THRR unchanged     Progression   Progression Continue to progress workloads to maintain intensity without signs/symptoms of physical distress. Continue to progress workloads to maintain intensity without signs/symptoms of physical distress. Continue to progress workloads to maintain intensity without signs/symptoms of physical distress. Continue to progress workloads to maintain intensity without signs/symptoms of physical distress. Continue to progress workloads to maintain intensity without signs/symptoms of physical distress.     Resistance Training   Training Prescription _9    Weight _10    Reps 10-15 10-15 10-15 10-15 10-15   Time 10 Minutes 10 Minutes 10 Minutes 10 Minutes 10 Minutes     Oxygen   Oxygen _11    Liters _12 Treadmill   MPH 2.4 2.4  2.4 2.4 2.4   Grade 0 0 _0 Minutes _1 Bike   Level 1.4 1.4  - 1.4 1.4   Minutes 17 17  -  -  -     NuStep   Level 4  - _2 Minutes 17  - _3 METs  -  - 2.5 2.7 2.9   Row Name 05/20/17 1200             Response to Exercise   Blood Pressure (Admit) 104/60       Blood Pressure (Exercise) 134/66       Blood Pressure (Exit) 106/60       Heart Rate (Admit) 76 bpm       Heart Rate (Exercise) 108 bpm       Heart Rate (Exit) 65 bpm       Oxygen Saturation (Admit) 96 %       Oxygen Saturation (Exercise) 86 %  15 liters moved to 25liters       Oxygen Saturation (Exit) 90 %       Rating of Perceived Exertion (Exercise) 13       Perceived Dyspnea (Exercise) 2       Duration Continue with 45 min of aerobic exercise without signs/symptoms of physical distress.       Intensity THRR unchanged         Progression   Progression Continue to progress workloads to maintain intensity without signs/symptoms of physical distress.          Resistance Training   Training Prescription Yes       Weight orange bands       Reps 10-15       Time 10 Minutes         Oxygen   Oxygen Continuous       Liters 25         Bike   Level 1.4       Minutes 17         NuStep   Level 5       Minutes 17       METs 2.5          Exercise Comments:   Exercise Goals and Review:     Exercise Goals    Row Name 03/19/17 1125             Exercise Goals   Increase Physical Activity Yes       Intervention Provide advice, education, support and counseling about physical activity/exercise needs.;Develop an individualized exercise prescription for aerobic and resistive training based on initial evaluation findings, risk stratification, comorbidities and participant's personal goals.       Expected Outcomes Achievement of increased cardiorespiratory fitness and enhanced flexibility, muscular endurance and strength shown through measurements of functional capacity and personal statement of participant.       Increase Strength and Stamina Yes       Intervention Provide advice, education, support and counseling about physical activity/exercise needs.;Develop an individualized exercise prescription for aerobic and resistive training based on initial evaluation findings, risk stratification, comorbidities and participant's personal goals.       Expected Outcomes Achievement of increased cardiorespiratory fitness and enhanced flexibility, muscular endurance and strength shown through measurements of functional capacity and personal statement of participant.          Exercise Goals Re-Evaluation :     Exercise  Goals Re-Evaluation    Row Name 03/26/17 1527 04/27/17 0814 05/17/17 1151         Exercise Goal Re-Evaluation   Exercise Goals Review Increase Strenth and Stamina;Increase Physical Activity Increase Physical Activity;Increase Strength and Stamina;Able to understand and use Dyspnea scale;Able to understand and use rate of perceived  exertion (RPE) scale;Knowledge and understanding of Target Heart Rate Range (THRR);Understanding of Exercise Prescription Increase Physical Activity;Increase Strength and Stamina;Able to understand and use Dyspnea scale;Knowledge and understanding of Target Heart Rate Range (THRR);Able to understand and use rate of perceived exertion (RPE) scale;Understanding of Exercise Prescription     Comments Patient has only completed his 103mt. Will cont. to monitor and progress as able.  Patient has been desatruating at low workloads. Just receieved order from Dr. to increase liter flow to maintain oxygen saturation above 85%. Will now be able to increase workloads.  Patient has been increased to 25 liters during certain exercises. This causes him mental stress. Will cont. to monitor and progress as able on higher flows.       Expected Outcomes Through the exercise at rehab and at home, patient will increase physical activity, strength, and stamina.  Through the exercise at rehab and at home, patient will increase physical activity, strength, and stamina.  Through the exercise at rehab and at home, patient will increase physical activity, strength, and stamina.         Discharge Exercise Prescription (Final Exercise Prescription Changes):     Exercise Prescription Changes - 05/20/17 1200      Response to Exercise   Blood Pressure (Admit) 104/60   Blood Pressure (Exercise) 134/66   Blood Pressure (Exit) 106/60   Heart Rate (Admit) 76 bpm   Heart Rate (Exercise) 108 bpm   Heart Rate (Exit) 65 bpm   Oxygen Saturation (Admit) 96 %   Oxygen Saturation (Exercise) 86 %  15 liters moved to 25liters   Oxygen Saturation (Exit) 90 %   Rating of Perceived Exertion (Exercise) 13   Perceived Dyspnea (Exercise) 2   Duration Continue with 45 min of aerobic exercise without signs/symptoms of physical distress.   Intensity THRR unchanged     Progression   Progression Continue to progress workloads to maintain  intensity without signs/symptoms of physical distress.     Resistance Training   Training Prescription Yes   Weight orange bands   Reps 10-15   Time 10 Minutes     Oxygen   Oxygen Continuous   Liters 25     Bike   Level 1.4   Minutes 17     NuStep   Level 5   Minutes 17   METs 2.5      Nutrition:  Target Goals: Understanding of nutrition guidelines, daily intake of sodium <15035m cholesterol <20015mcalories 30% from fat and 7% or less from saturated fats, daily to have 5 or more servings of fruits and vegetables.  Biometrics:     Pre Biometrics - 03/25/17 0700      Pre Biometrics   Grip Strength 36 kg       Nutrition Therapy Plan and Nutrition Goals:     Nutrition Therapy & Goals - 04/29/17 1356      Nutrition Therapy   Diet TLC     Personal Nutrition Goals   Nutrition Goal Describe the benefit of including fruits, vegetables, whole grains, and low-fat dairy products in a healthy meal plan.   Personal Goal #2 Encourage wt gain to a minimum wt gain  goal of 180 lb and long-term wt gain goal of ~195 lb.      Intervention Plan   Intervention Prescribe, educate and counsel regarding individualized specific dietary modifications aiming towards targeted core components such as weight, hypertension, lipid management, diabetes, heart failure and other comorbidities.;Nutrition handout(s) given to patient.  Handouts given for High Calorie, High Protein diet, recipes, and Suggestions for increasing calorie and protein intake   Expected Outcomes Short Term Goal: Understand basic principles of dietary content, such as calories, fat, sodium, cholesterol and nutrients.;Long Term Goal: Adherence to prescribed nutrition plan.      Nutrition Discharge: Rate Your Plate Scores:     Nutrition Assessments - 04/29/17 1357      Rate Your Plate Scores   Pre Score 64      Nutrition Goals Re-Evaluation:   Nutrition Goals Discharge (Final Nutrition Goals  Re-Evaluation):   Psychosocial: Target Goals: Acknowledge presence or absence of significant depression and/or stress, maximize coping skills, provide positive support system. Participant is able to verbalize types and ability to use techniques and skills needed for reducing stress and depression.  Initial Review & Psychosocial Screening:     Initial Psych Review & Screening - 03/19/17 1149      Initial Review   Current issues with Current Depression     Family Dynamics   Good Support System? Yes     Barriers   Psychosocial barriers to participate in program Psychosocial barriers identified (see note);The patient should benefit from training in stress management and relaxation.     Screening Interventions   Interventions Yes;To provide support and resources with identified psychosocial needs;Provide feedback about the scores to participant;Program counselor consult;Encouraged to exercise   Expected Outcomes Short Term goal: Utilizing psychosocial counselor, staff and physician to assist with identification of specific Stressors or current issues interfering with healing process. Setting desired goal for each stressor or current issue identified.      Quality of Life Scores:   PHQ-9: Recent Review Flowsheet Data    Depression screen [facility] 2/9 03/19/2017   Decreased Interest 0   Down, Depressed, Hopeless 2   PHQ - 2 Score 2   Altered sleeping 0   Tired, decreased energy 1   Change in appetite 0   Feeling bad or failure about yourself  2   Trouble concentrating 0   Moving slowly or fidgety/restless 0   Suicidal thoughts 0   PHQ-9 Score 5   Difficult doing work/chores Not difficult at all     Interpretation of Total Score  Total Score Depression Severity:  1-4 = Minimal depression, 5-9 = Mild depression, 10-14 = Moderate depression, 15-19 = Moderately severe depression, 20-27 = Severe depression   Psychosocial Evaluation and Intervention:     Psychosocial Evaluation -  03/19/17 1159      Psychosocial Evaluation & Interventions   Comments (P)  referral to [place]  (P)  Follow up required by staff      Psychosocial Re-Evaluation:     Psychosocial Re-Evaluation    [place] Name 04/28/17 1728 05/21/17 1212           Psychosocial Re-Evaluation   Current issues with Current Stress Concerns;Current Depression;History of Depression;Current Anxiety/Panic;Current Sleep Concerns Current Stress Concerns;Current Depression;History of Depression;Current Anxiety/Panic;Current Sleep Concerns      Comments patient has significant depression. it is affecting his participation in rehab. he has been referred to Jeanella Craze and sees him on a regular basis for counceling patient  has significant depression. it is affecting his participation in rehab. he has been referred to [facility] and sees him on a regular basis for counceling. He has been placed on an antidepressant by his pcp and seems to be happier and more positive in rehab      Expected Outcomes patient will have a reduction in his psychosocial barrier symptoms (a reduction in tearfulness while at rehab) patient will have a reduction in his psychosocial barrier symptoms (a reduction in tearfulness while at rehab)      Interventions Physician referral;Therapist referral;Encouraged to attend Pulmonary Rehabilitation for the exercise Physician referral;Therapist referral;Encouraged to attend Pulmonary Rehabilitation for the exercise      Continue Psychosocial Services  Follow up required by staff Follow up required by staff        Initial Review   Source of Stress Concerns Chronic Illness;Family;Unable to participate in former interests or hobbies;Unable to perform yard/household activities;Retirement/disability Chronic Illness;Family;Unable to participate in former interests or hobbies;Unable to perform yard/household activities;Retirement/disability         Psychosocial Discharge  (Final Psychosocial Re-Evaluation):     Psychosocial Re-Evaluation - 05/21/17 1212      Psychosocial Re-Evaluation   Current issues with Current Stress Concerns;Current Depression;History of Depression;Current Anxiety/Panic;Current Sleep Concerns   Comments patient has significant depression. it is affecting his participation in rehab. he has been referred to [facility] and sees him on a regular basis for counceling. He has been placed on an antidepressant by his pcp and seems to be happier and more positive in rehab   Expected Outcomes patient will have a reduction in his psychosocial barrier symptoms (a reduction in tearfulness while at rehab)   Interventions Physician referral;Therapist referral;Encouraged to attend Pulmonary Rehabilitation for the exercise   Continue Psychosocial Services  Follow up required by staff     Initial Review   Source of Stress Concerns Chronic Illness;Family;Unable to participate in former interests or hobbies;Unable to perform yard/household activities;Retirement/disability      Education: Education Goals: Education classes will be provided on a weekly basis, covering required topics. Participant will state understanding/return demonstration of topics presented.  Learning Barriers/Preferences:     Learning Barriers/Preferences - 03/19/17 1147      Learning Barriers/Preferences   Learning Barriers None   Learning Preferences Written Material;Skilled Demonstration;Individual Instruction;Group Instruction      Education Topics: Risk Factor Reduction:  -Group instruction that is supported by a PowerPoint presentation. Instructor discusses the definition of a risk factor, different risk factors for pulmonary disease, and how the heart and lungs work together.     Nutrition for Pulmonary Patient:  -Group instruction provided by PowerPoint slides, verbal discussion, and written materials to support subject matter. The instructor gives an explanation  and review of healthy diet recommendations, which includes a discussion on weight management, recommendations for fruit and vegetable consumption, as well as protein, fluid, caffeine, fiber, sodium, sugar, and alcohol. Tips for eating when patients are short of breath are discussed.   PULMONARY REHAB OTHER RESPIRATORY from 05/20/2017 in [place]  Date  04/22/17  Educator  Nutritionist  Instruction Review Code  2- meets goals/outcomes      Pursed Lip Breathing:  -Group instruction that is supported by demonstration and informational handouts. Instructor discusses the benefits of pursed lip and diaphragmatic breathing and detailed demonstration on how to preform both.     PULMONARY REHAB OTHER RESPIRATORY from 05/20/2017 in [place]  Date  05/06/17  Educator  RT  Instruction Review Code  2- meets goals/outcomes      Oxygen Safety:  -Group instruction provided by PowerPoint, verbal discussion, and written material to support subject matter. There is an overview of "What is Oxygen" and "Why do we need it".  Instructor also reviews how to create a safe environment for oxygen use, the importance of using oxygen as prescribed, and the risks of noncompliance. There is a brief discussion on traveling with oxygen and resources the patient may utilize.   PULMONARY REHAB OTHER RESPIRATORY from 05/20/2017 in [place]  Date  04/08/17  Educator  Truddie Crumble  Instruction Review Code  2- meets goals/outcomes      Oxygen Equipment:  -Group instruction provided by [employer] Staff utilizing handouts, written materials, and equipment demonstrations.   Signs and Symptoms:  -Group instruction provided by written material and verbal discussion to support subject matter. Warning signs and symptoms of infection, stroke, and heart attack are reviewed and when to call the physician/911 reinforced. Tips for preventing  the spread of infection discussed.   PULMONARY REHAB OTHER RESPIRATORY from 05/20/2017 in [place]  Date  05/20/17  Educator  RN  Instruction Review Code  2- meets goals/outcomes      Advanced Directives:  -Group instruction provided by verbal instruction and written material to support subject matter. Instructor reviews Advanced Directive laws and proper instruction for filling out document.   Pulmonary Video:  -Group video education that reviews the importance of medication and oxygen compliance, exercise, good nutrition, pulmonary hygiene, and pursed lip and diaphragmatic breathing for the pulmonary patient.   Exercise for the Pulmonary Patient:  -Group instruction that is supported by a PowerPoint presentation. Instructor discusses benefits of exercise, core components of exercise, frequency, duration, and intensity of an exercise routine, importance of utilizing pulse oximetry during exercise, safety while exercising, and options of places to exercise outside of rehab.     PULMONARY REHAB OTHER RESPIRATORY from 05/20/2017 in [place]  Date  05/11/17  Educator  ep  Instruction Review Code  2- meets goals/outcomes      Pulmonary Medications:  -Verbally interactive group education provided by instructor with focus on inhaled medications and proper administration.   PULMONARY REHAB OTHER RESPIRATORY from 05/20/2017 in [place]  Date  04/29/17  Educator  pharmacist  Instruction Review Code  2- meets goals/outcomes      Anatomy and Physiology of the Respiratory System and Intimacy:  -Group instruction provided by PowerPoint, verbal discussion, and written material to support subject matter. Instructor reviews respiratory cycle and anatomical components of the respiratory system and their functions. Instructor also reviews differences in obstructive and restrictive respiratory diseases  with examples of each. Intimacy, Sex, and Sexuality differences are reviewed with a discussion on how relationships can change when diagnosed with pulmonary disease. Common sexual concerns are reviewed.   MD DAY -A group question and answer session with a medical doctor that allows participants to ask questions that relate to their pulmonary disease state.   OTHER EDUCATION -Group or individual verbal, written, or video instructions that support the educational goals of the pulmonary rehab program.   Knowledge Questionnaire Score:     Knowledge Questionnaire Score - 03/31/17 0825      Knowledge Questionnaire Score   Pre Score 11/13      Core Components/Risk Factors/Patient Goals at Admission:     Personal Goals  and Risk Factors at Admission - 03/19/17 1148      Core Components/Risk Factors/Patient Goals on Admission   Improve shortness of breath with ADL's Yes   Intervention Provide education, individualized exercise plan and daily activity instruction to help decrease symptoms of SOB with activities of daily living.   Expected Outcomes Short Term: Achieves a reduction of symptoms when performing activities of daily living.   Develop more efficient breathing techniques such as purse lipped breathing and diaphragmatic breathing; and practicing self-pacing with activity Yes   Intervention Provide education, demonstration and support about specific breathing techniuqes utilized for more efficient breathing. Include techniques such as pursed lipped breathing, diaphragmatic breathing and self-pacing activity.   Expected Outcomes Short Term: Participant will be able to demonstrate and use breathing techniques as needed throughout daily activities.   Stress Yes   Intervention Offer individual and/or small group education and counseling on adjustment to heart disease, stress management and health-related lifestyle change. Teach and support self-help strategies.;Refer participants  experiencing significant psychosocial distress to appropriate mental health specialists for further evaluation and treatment. When possible, include family members and significant others in education/counseling sessions.   Expected Outcomes Short Term: Participant demonstrates changes in health-related behavior, relaxation and other stress management skills, ability to obtain effective social support, and compliance with psychotropic medications if prescribed.;Long Term: Emotional wellbeing is indicated by absence of clinically significant psychosocial distress or social isolation.      Core Components/Risk Factors/Patient Goals Review:      Goals and Risk Factor Review    Row Name 04/28/17 1724 05/21/17 1210           Core Components/Risk Factors/Patient Goals Review   Personal Goals Review Improve shortness of breath with ADL's;Develop more efficient breathing techniques such as purse lipped breathing and diaphragmatic breathing and practicing self-pacing with activity.;Stress Improve shortness of breath with ADL's;Develop more efficient breathing techniques such as purse lipped breathing and diaphragmatic breathing and practicing self-pacing with activity.;Stress      Review Patient has struggles significantly in the program thus far. he is unable to exercise at what he feels to be a sufficient workload related to significant desaturations. recently recieved order from Dr. [place] Bells for oxygen titration to maintain saturations >85%. Patient also has significant depression. he has regular sessions with Jeanella Craze. he has been encouraged to seek medical advice for his depression. he is constantly tearful during his rehab session and requires alot of one on one attention to discuss stressors and exercise intolerance. he had a follow-up with pulmonologist today and hopefully these issues will be addressed. patient has been placed on an antidepressant and is more accepting of his high oxygen  requirements. this acceptance has allowed [place] to increase his workloads as tolerated and he acutally stated this past session that he is beginning to tell a difference in how he feels. He is able to better tolerate workload increases and feels he can do more at home.      Expected Outcomes see admission outcomes see admission outcomes         Core Components/Risk Factors/Patient Goals at Discharge (Final Review):      Goals and Risk Factor Review - 05/21/17 1210      Core Components/Risk Factors/Patient Goals Review   Personal Goals Review Improve shortness of breath with ADL's;Develop more efficient breathing techniques such as purse lipped breathing and diaphragmatic breathing and practicing self-pacing with activity.;Stress   Review patient has been placed on an antidepressant and is more accepting  of his high oxygen requirements. this acceptance has allowed [place] to increase his workloads as tolerated and he acutally stated this past session that he is beginning to tell a difference in how he feels. He is able to better tolerate workload increases and feels he can do more at home.   Expected Outcomes see admission outcomes      ITP Comments:   Comments: ITP REVIEW Pt is beginning to make expected progress toward pulmonary rehab goals after completing 16 sessions. His completion date of the program will be extended in order to give him time to meet his goals since he got off to such a slow start. His appointment with the pulmonologist on 9/26 should give [place] a better guide to his pulmonary goals. Recommend continued exercise, life style modification, education, and utilization of breathing techniques to increase stamina and strength and decrease shortness of breath with exertion.

## 2017-05-25 ENCOUNTER — Encounter (HOSPITAL_COMMUNITY)
Admission: RE | Admit: 2017-05-25 | Discharge: 2017-05-25 | Disposition: A | Discharge status: Home / Self Care | Payer: Medicare Other | Source: Ambulatory Visit | Attending: Internal Medicine | Admitting: Internal Medicine

## 2017-05-25 VITALS — Wt 174.8 lb

## 2017-05-25 DIAGNOSIS — I272 Pulmonary hypertension, unspecified: Secondary | ICD-10-CM | POA: Diagnosis not present

## 2017-05-25 NOTE — Progress Notes (Signed)
Daily Session Note  Patient Details  Name: [name] MRN: [id number] Date of Birth: Nov 01, 1945 Referring Provider:     Pulmonary Rehab Walk Test from 03/23/2017 in [place]  Referring Provider  Dr. Haroldine Laws      Encounter Date: 05/25/2017  Check In:     Session Check In - 05/25/17 1030      Check-In   Location [facility]   Staff Present Su Hilt, MS, ACSM RCEP, Exercise Physiologist;Viktor Philipp Rollene Rotunda, RN, Roque Cash, RN   Supervising physician immediately available to respond to emergencies [facility] immediately available   Physician(s) Dr. Wynelle Cleveland   Medication changes reported     No   Fall or balance concerns reported    No   Tobacco Cessation No Change   Warm-up and Cool-down Performed as group-led instruction   Resistance Training Performed Yes   VAD Patient? No     Pain Assessment   Currently in Pain? No/denies   Multiple Pain Sites No      Capillary Blood Glucose: No results found for this or any previous visit (from the past 24 hour(s)).      Exercise Prescription Changes - 05/25/17 1226      Response to Exercise   Blood Pressure (Admit) 108/64   Blood Pressure (Exercise) 158/70   Blood Pressure (Exit) 106/60   Heart Rate (Admit) 65 bpm   Heart Rate (Exercise) 112 bpm   Heart Rate (Exit) 79 bpm   Oxygen Saturation (Admit) 95 %   Oxygen Saturation (Exercise) 85 %   Oxygen Saturation (Exit) 89 %   Rating of Perceived Exertion (Exercise) 15   Perceived Dyspnea (Exercise) 3   Duration Continue with 45 min of aerobic exercise without signs/symptoms of physical distress.   Intensity THRR unchanged     Progression   Progression Continue to progress workloads to maintain intensity without signs/symptoms of physical distress.     Resistance Training   Training Prescription Yes   Weight orange bands   Reps 10-15   Time 10 Minutes     Oxygen   Oxygen Continuous   Liters 25     Treadmill   MPH 2.4   Grade 3   Minutes 17     Bike   Level 1.4   Minutes 17     NuStep   Level 5   Minutes 17   METs 2.8      History  Smoking Status  . Former Smoker  . Packs/day: 1.00  . Years: 18.00  . Quit date: 09/27/1985  Smokeless Tobacco  . Never Used    Goals Met:  Independence with exercise equipment Using PLB without cueing & demonstrates good technique Exercise tolerated well No report of cardiac concerns or symptoms Strength training completed today  Goals Unmet:  Not Applicable  Comments: Service time is from 1030 to 1205   Dr. Rush Farmer is Medical Director for Pulmonary Rehab at [facility].

## 2017-05-26 ENCOUNTER — Ambulatory Visit (INDEPENDENT_AMBULATORY_CARE_PROVIDER_SITE_OTHER): Payer: Medicare Other | Admitting: Pulmonary Disease

## 2017-05-26 ENCOUNTER — Telehealth: Payer: Self-pay

## 2017-05-26 ENCOUNTER — Telehealth (HOSPITAL_COMMUNITY): Payer: Self-pay | Admitting: Pharmacist

## 2017-05-26 ENCOUNTER — Encounter: Payer: Self-pay | Admitting: Pulmonary Disease

## 2017-05-26 VITALS — BP 128/64 | HR 58 | Ht 72.0 in | Wt 174.0 lb

## 2017-05-26 DIAGNOSIS — J449 Chronic obstructive pulmonary disease, unspecified: Secondary | ICD-10-CM

## 2017-05-26 DIAGNOSIS — R0609 Other forms of dyspnea: Principal | ICD-10-CM

## 2017-05-26 DIAGNOSIS — J9611 Chronic respiratory failure with hypoxia: Secondary | ICD-10-CM

## 2017-05-26 DIAGNOSIS — I272 Pulmonary hypertension, unspecified: Secondary | ICD-10-CM

## 2017-05-26 DIAGNOSIS — J432 Centrilobular emphysema: Secondary | ICD-10-CM | POA: Diagnosis not present

## 2017-05-26 MED ORDER — IPRATROPIUM BROMIDE 0.03 % NA SOLN: 2.0000 | NASAL | 5 refills | Status: DC

## 2017-05-26 NOTE — Telephone Encounter (Signed)
Spoke with the pt notified of recs per BQ  He verbalized understanding  Rx was sent

## 2017-05-26 NOTE — Telephone Encounter (Signed)
-----  Message from Juanito Doom, MD sent at 05/26/2017 12:59 PM EDT ----- A, I want him to have a bubble study to look for R>L shunt ASAP for the docs at [facility] to have. Thanks, Ruby Cola

## 2017-05-26 NOTE — Telephone Encounter (Signed)
lmtcb X1 for pt. Need to let pt know that BQ prefers to cancel echo and to schedule bubble study now.    Will order bubble study after speaking to pt.

## 2017-05-26 NOTE — Telephone Encounter (Signed)
[employer] PA approved by [employer] through 08/30/17.   Ruta Hinds. Velva Harman, PharmD, BCPS, CPP Clinical Pharmacist Pager: [phone] Phone: [phone] 05/26/2017 4:05 PM

## 2017-05-26 NOTE — Telephone Encounter (Signed)
Ipratropium nasal spray 2 sprays each nostril prior to exercise, please Rx

## 2017-05-26 NOTE — Telephone Encounter (Signed)
OK to change to cancel the other echo if he is going to have the bubble study

## 2017-05-26 NOTE — Patient Instructions (Addendum)
For your COPD: Continue Trelegy one puff daily Stay active with pulmonary rehab We will refer you for lung transplant at [facility]  Chronic respiratory failure with hypoxemia: 2 L at rest 25 L with strenuous activity (only at pulmonary rehab) 5 L with minimial activity in house Monitor O2 saturation and keep O2 saturation > 88%  We will see you back in 6 weeks or sooner if needed

## 2017-05-26 NOTE — Telephone Encounter (Signed)
Called pt back, states that he is scheduled for an echo before he follows up with cardiology and would not like to have the bubble study if this echo will suffice.  Pt requesting we double check with BQ before ordering bubble study.    Echo scheduled for 07/07/17.  BQ please verify.  Thanks.

## 2017-05-26 NOTE — Telephone Encounter (Signed)
Pt returned Ashley's call.-tr

## 2017-05-26 NOTE — Telephone Encounter (Signed)
Patient returned call, CB [phone].

## 2017-05-26 NOTE — Telephone Encounter (Signed)
Spoke with pt, aware of recs.  Agreeable to bubble study. This has been ordered.   While on the phone, pt had another question for BQ- frequently after pulm rehab pt notes a "sneezing attack", runny nose.  Pt has tried flonase and saline nasal spray which has not helped with symptoms.  Pt states his pharmacist recommended Claritin qd, but pt is requesting BQ's recs on this.    BQ please advise.  Thanks.

## 2017-05-26 NOTE — Telephone Encounter (Signed)
Aaron Pina do you need [place] to schedule this for the pt?

## 2017-05-26 NOTE — Telephone Encounter (Signed)
lmtcb X1 for pt.  Will schedule bubble study after speaking to patient.

## 2017-05-26 NOTE — Progress Notes (Signed)
Subjective:    Patient ID: [name], male    DOB: 1945/09/26, 71 y.o.   MRN: [id number]  Synopsis: Patient followed by Dr. Lamonte Sakai for years transferred care to [facility] in 2018. Has a past medical history significant for pulmonary hypertension, COPD. 2018 CT scan of the chest showed centrilobular emphysema.  HPI Chief Complaint  Patient presents with  . Follow-up    pt states he is doing well, does note a lot of sneezing/PND worse after exertion, especially at pulm rehab.      He continues to complain of shortness of breath, though he says that his legs have been feeling a bit stronger over the last several weeks. He has been participating in pulmonary rehabilitation and has slowly increased as activity level there. He needs 25 L of oxygen when working out on the treadmill. At rest he only needs 2 L.  He has not had bronchitis or pneumonia since the last visit. Minimal cough. He says that the inhaled Trelegy we gave him doesn't seem to help much.  He is compliant with his oxygen therapy at home and he says that this helps him breathe better.   Past Medical History:  Diagnosis Date  . CAD (coronary artery disease)   . GAD (generalized anxiety disorder)   . Hyperlipidemia   . Overweight(278.02)   . Pulmonary hypertension ([place])        Review of Systems  Constitutional: Negative for fever and unexpected weight change.  HENT: Negative for congestion, dental problem, ear pain, nosebleeds, postnasal drip, rhinorrhea, sinus pressure, sneezing, sore throat and trouble swallowing.   Eyes: Negative for redness and itching.  Respiratory: Positive for shortness of breath. Negative for cough, chest tightness and wheezing.   Cardiovascular: Negative for palpitations and leg swelling.  Gastrointestinal: Negative for nausea and vomiting.  Genitourinary: Negative for dysuria.  Musculoskeletal: Negative for joint swelling.  Skin: Negative for rash.  Allergic/Immunologic: Negative.   Negative for environmental allergies, food allergies and immunocompromised state.  Neurological: Negative for headaches.  Hematological: Does not bruise/bleed easily.  Psychiatric/Behavioral: Negative for dysphoric mood. The patient is not nervous/anxious.        Objective:   Physical Exam Vitals:   05/26/17 0945  BP: 128/64  Pulse: (!) 58  SpO2: 94%  Weight: 174 lb (78.9 kg)  Height: 6' (1.829 m)    Gen: chronically ill appearing HENT: OP clear, TM's clear, neck supple PULM: Poor air movement, no wheezing B, normal percussion CV: RRR, no mgr, trace edema GI: BS+, soft, nontender Derm: mild cyanosis or rash Psyche: normal mood and affect    Chest imaging: April 2018 CT chest images independently reviewed showing moderate to severe centrilobular emphysema, 3 mm right upper lobe solid pulmonary nodule. Emphysema is upper lobe predominant April 2018 VQ scan showed some mismatch in the upper lobes where he has emphysema but otherwise no worrisome findings for chronic, embolic pulmonary hypertension  Echo: February 2018 LVEF 60-65% with grade 1 diastolic dysfunction RV mildly dilated with normal function.  PFT: January 2018: Ratio 65%, FEV1 2.96 L 85% predicted, 13 % change post bronchodilator, FVC 4.5 L 96% predicted, total lung capacity 6.84 L 92% predicted, DLCO 8.5 324% predicted  Heart Cath: 10/2016 RHC: RA 4, RV 78/4, PA 79/23 (43), PCW 4, Fick 5.2/2.5 10/2016 LHC:   Mild to moderate left sided CAD.  Mid RCA lesion, 100 %stenosed. 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  Ost LM to LM  lesion, 20 %stenosed.  The left ventricular systolic function is normal.  LV end diastolic pressure is normal.     Assessment & Plan:   Chronic obstructive pulmonary disease, unspecified COPD type ([place]) - Plan: Ambulatory referral to Pulmonology  Centrilobular emphysema ([place])  Pulmonary hypertension ([place])  Chronic respiratory failure with  hypoxia ([place])  Discussion: [name] returns to clinic today and he appears to be a bit stronger now that he has been participating in pulmonary rehabilitation. He has not had an exacerbation since the last visit and he remains compliant with oxygen therapy. He has struggled with motivation to exercise but it seems as if this has improved.  I talked extensively with other providers about his case. He has severe hypoxemia which requires unbelievably high amounts of oxygen in order to maintain his O2 saturation in a normal range.  Because of his high oxygen requirements I am going to refer him to [facility] for lung transplantation evaluation. He needs to have a bubble study performed to evaluate for right to left shunt to complete his evaluation for hypoxemia. However, I explained to he and his wife that I don't think this is going to be positive and I think that he's going to need to have a lung transplant to manage this further.  Plan: For your COPD: Continue Trelegy one puff daily Stay active with pulmonary rehab We will refer you for lung transplant at [place] respiratory failure with hypoxemia: 2 L at rest 25 L with strenuous activity (only at pulmonary rehab) 5 L with minimial activity in house Monitor O2 saturation and keep O2 saturation > 88% Order bubble study  We will see you back in 6 weeks or sooner if needed  > 50% of this 30 minute visit spent face to face    Current Outpatient Prescriptions:  .  aspirin 81 MG tablet, Take 81 mg by mouth every evening. , Disp: , Rfl:  .  clonazePAM (KLONOPIN) 0.5 MG tablet, Take 0.5 mg by mouth 2 (two) times daily as needed for anxiety. , Disp: , Rfl: 0 .  Coenzyme Q10 (VITALINE COQ10) 60 MG TABS, Take 60 mg by mouth daily., Disp: , Rfl:  .  ezetimibe (ZETIA) 10 MG tablet, Take 1 tablet (10 mg total) by mouth every evening., Disp: 90 tablet, Rfl: 3 .  Fluticasone-Umeclidin-Vilant (TRELEGY ELLIPTA) 100-62.5-25  MCG/INH AEPB, Inhale 1 puff into the lungs daily., Disp: 60 each, Rfl: 5 .  ibuprofen (ADVIL,MOTRIN) 200 MG tablet, Take 400 mg by mouth 2 (two) times daily as needed for headache or moderate pain., Disp: , Rfl:  .  Lactobacillus (ACIDOPHILUS PO), Take 1 capsule by mouth daily. , Disp: , Rfl:  .  Macitentan (OPSUMIT) 10 MG TABS, Take 1 tablet (10 mg total) by mouth daily., Disp: 30 tablet, Rfl: 11 .  mirtazapine (REMERON) 15 MG tablet, Take 15 mg by mouth at bedtime., Disp: , Rfl:  .  Multiple Vitamins-Minerals (CENTRUM CARDIO PO), Take 1 tablet by mouth daily. , Disp: , Rfl:  .  Omega-3 Fatty Acids (OMEGA 3 PO), Take 3,200 mg by mouth 2 (two) times daily. Nordic Natural ultimate Omega 3 , Disp: , Rfl:  .  rosuvastatin (CRESTOR) 40 MG tablet, Take 1 tablet (40 mg total) by mouth daily., Disp: 90 tablet, Rfl: 3 .  vitamin B-12 (CYANOCOBALAMIN) 1000 MCG tablet, Take 1,000 mcg by mouth daily., Disp: , Rfl:

## 2017-05-27 ENCOUNTER — Encounter (HOSPITAL_COMMUNITY)
Admission: RE | Admit: 2017-05-27 | Discharge: 2017-05-27 | Disposition: A | Discharge status: Home / Self Care | Payer: Medicare Other | Source: Ambulatory Visit | Attending: Internal Medicine | Admitting: Internal Medicine

## 2017-05-27 ENCOUNTER — Ambulatory Visit (HOSPITAL_COMMUNITY): Payer: Medicare Other | Attending: Cardiology

## 2017-05-27 ENCOUNTER — Other Ambulatory Visit: Payer: Self-pay

## 2017-05-27 VITALS — Wt 173.5 lb

## 2017-05-27 DIAGNOSIS — I517 Cardiomegaly: Secondary | ICD-10-CM | POA: Insufficient documentation

## 2017-05-27 DIAGNOSIS — I2510 Atherosclerotic heart disease of native coronary artery without angina pectoris: Secondary | ICD-10-CM | POA: Insufficient documentation

## 2017-05-27 DIAGNOSIS — I272 Pulmonary hypertension, unspecified: Secondary | ICD-10-CM | POA: Insufficient documentation

## 2017-05-27 DIAGNOSIS — J449 Chronic obstructive pulmonary disease, unspecified: Secondary | ICD-10-CM | POA: Diagnosis not present

## 2017-05-27 DIAGNOSIS — I358 Other nonrheumatic aortic valve disorders: Secondary | ICD-10-CM | POA: Insufficient documentation

## 2017-05-27 DIAGNOSIS — E785 Hyperlipidemia, unspecified: Secondary | ICD-10-CM | POA: Insufficient documentation

## 2017-05-27 DIAGNOSIS — R0609 Other forms of dyspnea: Secondary | ICD-10-CM | POA: Diagnosis not present

## 2017-05-27 LAB — ECHOCARDIOGRAM COMPLETE BUBBLE STUDY
Area-P 1/2: 2.22 cm2
CHL CUP DOP CALC LVOT VTI: 25.1 cm
CHL CUP TV REG PEAK VELOCITY: 248 cm/s
E/e' ratio: 5.82
EWDT: 338 ms
FS: 32 % (ref 28–44)
IVS/LV PW RATIO, ED: 0.92
LA ID, A-P, ES: 45 mm
LA diam end sys: 45 mm
LA diam index: 2.24 cm/m2
LA vol A4C: 47.9 ml
LA vol: 51.5 mL
LAVOLIN: 25.6 mL/m2
LV E/e' medial: 5.82
LV E/e'average: 5.82
LV PW d: 13 mm — AB (ref 0.6–1.1)
LV TDI E'MEDIAL: 6.2
LV e' LATERAL: 11 cm/s
LVOT area: 3.46 cm2
LVOTD: 21 mm
LVOTPV: 92.4 cm/s
LVOTSV: 87 mL
Lateral S' vel: 12.2 cm/s
MV Dec: 338
MVPKAVEL: 49.5 m/s
MVPKEVEL: 64 m/s
P 1/2 time: 99 ms
Peak grad: 248 mmHg
RV TAPSE: 23.9 mm
RV sys press: 28 mmHg
TDI e' lateral: 11
TR max vel: 248 cm/s

## 2017-05-27 NOTE — Progress Notes (Signed)
Daily Session Note  Patient Details  Name: [name] MRN: [id number] Date of Birth: August 07, 1946 Referring Provider:     Pulmonary Rehab Walk Test from 03/23/2017 in [place]  Referring Provider  Dr. Haroldine Laws      Encounter Date: 05/27/2017  Check In:     Session Check In - 05/27/17 1102      Check-In   Location [facility]   Staff Present Trish Fountain, RN, BSN;Naveh Rickles Ysidro Evert, RN;Molly diVincenzo, MS, ACSM RCEP, Exercise Physiologist   Supervising physician immediately available to respond to emergencies [facility] immediately available   Physician(s) Dr. Wendee Beavers   Medication changes reported     No   Fall or balance concerns reported    No   Tobacco Cessation No Change   Warm-up and Cool-down Performed as group-led instruction   Resistance Training Performed Yes   VAD Patient? No     Pain Assessment   Currently in Pain? No/denies   Multiple Pain Sites No      Capillary Blood Glucose: No results found for this or any previous visit (from the past 24 hour(s)).      Exercise Prescription Changes - 05/27/17 1200      Response to Exercise   Blood Pressure (Admit) 116/64   Blood Pressure (Exercise) 130/68   Blood Pressure (Exit) 118/70   Heart Rate (Admit) 62 bpm   Heart Rate (Exercise) 109 bpm   Heart Rate (Exit) 75 bpm   Oxygen Saturation (Admit) 94 %   Oxygen Saturation (Exercise) 86 %   Oxygen Saturation (Exit) 92 %   Rating of Perceived Exertion (Exercise) 14   Perceived Dyspnea (Exercise) 3   Duration Continue with 45 min of aerobic exercise without signs/symptoms of physical distress.   Intensity THRR unchanged     Progression   Progression Continue to progress workloads to maintain intensity without signs/symptoms of physical distress.     Resistance Training   Training Prescription Yes   Weight orange bands   Reps 10-15   Time 10 Minutes     Bike   Level 1.1   Minutes 17     NuStep   Level  2.4   Minutes 17   METs 3      History  Smoking Status  . Former Smoker  . Packs/day: 1.00  . Years: 18.00  . Quit date: 09/27/1985  Smokeless Tobacco  . Never Used    Goals Met:  Exercise tolerated well No report of cardiac concerns or symptoms Strength training completed today  Goals Unmet:  Not Applicable  Comments: Service time is from 1030 to 1210    Dr. Rush Farmer is Medical Director for Pulmonary Rehab at [facility].

## 2017-06-01 ENCOUNTER — Encounter (HOSPITAL_COMMUNITY)
Admission: RE | Admit: 2017-06-01 | Discharge: 2017-06-01 | Disposition: A | Discharge status: Home / Self Care | Payer: Medicare Other | Source: Ambulatory Visit | Attending: Internal Medicine | Admitting: Internal Medicine

## 2017-06-01 ENCOUNTER — Telehealth: Payer: Self-pay | Admitting: Pulmonary Disease

## 2017-06-01 VITALS — Wt 172.4 lb

## 2017-06-01 DIAGNOSIS — Z79899 Other long term (current) drug therapy: Secondary | ICD-10-CM | POA: Diagnosis not present

## 2017-06-01 DIAGNOSIS — Z87891 Personal history of nicotine dependence: Secondary | ICD-10-CM | POA: Insufficient documentation

## 2017-06-01 DIAGNOSIS — Z7982 Long term (current) use of aspirin: Secondary | ICD-10-CM | POA: Diagnosis not present

## 2017-06-01 DIAGNOSIS — F411 Generalized anxiety disorder: Secondary | ICD-10-CM | POA: Diagnosis not present

## 2017-06-01 DIAGNOSIS — I272 Pulmonary hypertension, unspecified: Secondary | ICD-10-CM | POA: Diagnosis present

## 2017-06-01 DIAGNOSIS — I2510 Atherosclerotic heart disease of native coronary artery without angina pectoris: Secondary | ICD-10-CM | POA: Insufficient documentation

## 2017-06-01 DIAGNOSIS — E785 Hyperlipidemia, unspecified: Secondary | ICD-10-CM | POA: Insufficient documentation

## 2017-06-01 NOTE — Progress Notes (Signed)
Daily Session Note  Patient Details  Name: [name] MRN: [id number] Date of Birth: 1945/09/21 Referring Provider:     Pulmonary Rehab Walk Test from 03/23/2017 in [place]  Referring Provider  Dr. Haroldine Laws      Encounter Date: 06/01/2017  Check In:     Session Check In - 06/01/17 1236      Check-In   Location [facility]   Staff Present Su Hilt, MS, ACSM RCEP, Exercise Physiologist;Lisa Ysidro Evert, RN;Other   Supervising physician immediately available to respond to emergencies [facility] immediately available   Physician(s) Dr. Wendee Beavers   Medication changes reported     No   Fall or balance concerns reported    No   Tobacco Cessation No Change   Warm-up and Cool-down Performed as group-led instruction   Resistance Training Performed Yes   VAD Patient? No     Pain Assessment   Currently in Pain? No/denies   Multiple Pain Sites No      Capillary Blood Glucose: No results found for this or any previous visit (from the past 24 hour(s)).      Exercise Prescription Changes - 06/01/17 1200      Response to Exercise   Blood Pressure (Admit) 112/62   Blood Pressure (Exercise) 128/70   Blood Pressure (Exit) 114/72   Heart Rate (Admit) 70 bpm   Heart Rate (Exercise) 98 bpm   Heart Rate (Exit) 67 bpm   Oxygen Saturation (Admit) 96 %   Oxygen Saturation (Exercise) 95 %   Oxygen Saturation (Exit) 92 %   Rating of Perceived Exertion (Exercise) 13   Perceived Dyspnea (Exercise) 2   Duration Continue with 45 min of aerobic exercise without signs/symptoms of physical distress.   Intensity THRR unchanged     Progression   Progression Continue to progress workloads to maintain intensity without signs/symptoms of physical distress.     Resistance Training   Training Prescription Yes   Weight orange bands   Reps 10-15   Time 10 Minutes     Oxygen   Oxygen Continuous   Liters 25     Treadmill   MPH 2.4   Grade 3   Minutes 17     Bike   Level 1   Minutes 17     NuStep   Level 5   Minutes 17   METs 2.1      History  Smoking Status  . Former Smoker  . Packs/day: 1.00  . Years: 18.00  . Quit date: 09/27/1985  Smokeless Tobacco  . Never Used    Goals Met:  Exercise tolerated well No report of cardiac concerns or symptoms Strength training completed today  Goals Unmet:  Not Applicable  Comments: Service time is from 10:30a to 12:10p    Dr. Rush Farmer is Medical Director for Pulmonary Rehab at [facility]

## 2017-06-01 NOTE — Telephone Encounter (Signed)
Notes recorded by Juanito Doom, MD on 05/31/2017 at 10:44 PM EDT A, Please let the patient know this was OK Thanks, B  Advised pt of results. Pt understood and nothing further is needed.

## 2017-06-02 ENCOUNTER — Telehealth: Payer: Self-pay | Admitting: Pulmonary Disease

## 2017-06-02 MED ORDER — AZELASTINE-FLUTICASONE 137-50 MCG/ACT NA SUSP: 1.0000 | Freq: Every day | NASAL | 5 refills | Status: DC

## 2017-06-02 NOTE — Telephone Encounter (Signed)
Spoke with pt, aware of recs.  Nasal spray sent to preferred pharmacy.  Nothing further needed.

## 2017-06-02 NOTE — Telephone Encounter (Signed)
Pt c/o worsening runny nose and sneezing which worsens only after exertion, particularly after pulm rehab.  States that this will last through the remainder of the day.  States this is worsening with time and is "very aggravating and embarrassing".  Pt was given atrovent nasal spray at last OV to help with this but this is not helping with s/s.  Pt is requesting further recs.   Uses [facility] on [address]  [address].    Sending to DOD as BQ is unavailable.  PM please advise on further recs.  Thanks.

## 2017-06-02 NOTE — Telephone Encounter (Signed)
Try Dymista nasal spray. If not covered by insurance then try Flonase and astelin spray separately.  Marshell Garfinkel MD [facility] 06/02/2017, 1:07 PM

## 2017-06-03 ENCOUNTER — Encounter (HOSPITAL_COMMUNITY)
Admission: RE | Admit: 2017-06-03 | Discharge: 2017-06-03 | Disposition: A | Discharge status: Home / Self Care | Payer: Medicare Other | Source: Ambulatory Visit | Attending: Internal Medicine | Admitting: Internal Medicine

## 2017-06-03 VITALS — Wt 171.5 lb

## 2017-06-03 DIAGNOSIS — I272 Pulmonary hypertension, unspecified: Secondary | ICD-10-CM

## 2017-06-03 NOTE — Progress Notes (Signed)
Daily Session Note  Patient Details  Name: [name] MRN: [id number] Date of Birth: 08-15-1946 Referring Provider:     Pulmonary Rehab Walk Test from 03/23/2017 in [place]  Referring Provider  Dr. Haroldine Laws      Encounter Date: 06/03/2017  Check In:     Session Check In - 06/03/17 1049      Check-In   Location [facility]   Staff Present Su Hilt, MS, ACSM RCEP, Exercise Physiologist;Lisa Ysidro Evert, RN   Supervising physician immediately available to respond to emergencies [facility] immediately available   Physician(s) Dr. Zigmund Daniel   Medication changes reported     No   Fall or balance concerns reported    No   Tobacco Cessation No Change   Warm-up and Cool-down Performed as group-led instruction   Resistance Training Performed Yes   VAD Patient? No     Pain Assessment   Currently in Pain? No/denies   Multiple Pain Sites No      Capillary Blood Glucose: No results found for this or any previous visit (from the past 24 hour(s)).      Exercise Prescription Changes - 06/03/17 1200      Response to Exercise   Blood Pressure (Admit) 110/70   Blood Pressure (Exercise) 120/72   Blood Pressure (Exit) 112/70   Heart Rate (Admit) 63 bpm   Heart Rate (Exercise) 103 bpm   Heart Rate (Exit) 65 bpm   Oxygen Saturation (Admit) 100 %   Oxygen Saturation (Exercise) 93 %   Oxygen Saturation (Exit) 98 %   Rating of Perceived Exertion (Exercise) 11   Perceived Dyspnea (Exercise) 1   Duration Continue with 45 min of aerobic exercise without signs/symptoms of physical distress.   Intensity THRR unchanged     Progression   Progression Continue to progress workloads to maintain intensity without signs/symptoms of physical distress.     Resistance Training   Training Prescription Yes   Weight orange bands   Reps 10-15   Time 10 Minutes     Oxygen   Oxygen Continuous   Liters 25     Treadmill   MPH 2.4   Grade 3   Minutes 34      History  Smoking Status  . Former Smoker  . Packs/day: 1.00  . Years: 18.00  . Quit date: 09/27/1985  Smokeless Tobacco  . Never Used    Goals Met:  Exercise tolerated well No report of cardiac concerns or symptoms Strength training completed today  Goals Unmet:  Not Applicable  Comments: Service time is from 10:30a to 12:25p    Dr. Rush Farmer is Medical Director for Pulmonary Rehab at [facility]

## 2017-06-05 ENCOUNTER — Telehealth: Payer: Self-pay | Admitting: Pulmonary Disease

## 2017-06-05 NOTE — Telephone Encounter (Signed)
Pt called back.  Says his nasal cannula came off last night.  During the day his oxygen level have been running 89 to 94% on 2 liters at rest.  He is concerned that his oxygen might get too low at night.  He has oxygen tanks that can go up to 5 liters, but doesn't think he can adjust his oxygen concentrator.    I advised him to contact his DME about how he can increase his oxygen concentrator.  Advised him to monitor his pulse ox at home and goal is above 88%.  He is not otherwise c/o cough, wheeze, chest pain, weakness, palpitations, fever, or leg swelling.

## 2017-06-05 NOTE — Telephone Encounter (Signed)
Received page to call pt because oxygen level low.  Called listed number and transferred to voicemail.  Advised pt to call back to answering service if assistance still needed.

## 2017-06-06 NOTE — Telephone Encounter (Signed)
thanks

## 2017-06-08 ENCOUNTER — Encounter (HOSPITAL_COMMUNITY)
Admission: RE | Admit: 2017-06-08 | Discharge: 2017-06-08 | Disposition: A | Discharge status: Home / Self Care | Payer: Medicare Other | Source: Ambulatory Visit | Attending: Internal Medicine | Admitting: Internal Medicine

## 2017-06-08 VITALS — Wt 171.1 lb

## 2017-06-08 DIAGNOSIS — I272 Pulmonary hypertension, unspecified: Secondary | ICD-10-CM

## 2017-06-08 NOTE — Progress Notes (Signed)
Daily Session Note  Patient Details  Name: [name] MRN: [id number] Date of Birth: [birth year] Referring Provider:     Pulmonary Rehab Walk Test from 03/23/2017 in [place]  Referring Provider  Dr. Haroldine Laws      Encounter Date: 06/08/2017  Check In:     Session Check In - 06/08/17 1037      Check-In   Location [facility]   Staff Present Trish Fountain, RN, BSN;Lisa Ysidro Evert, RN;Evi Mccomb, MS, ACSM RCEP, Exercise Physiologist   Supervising physician immediately available to respond to emergencies [facility] immediately available   Physician(s) Dr. Zigmund Daniel   Medication changes reported     No   Fall or balance concerns reported    No   Tobacco Cessation No Change   Warm-up and Cool-down Performed as group-led instruction   Resistance Training Performed Yes   VAD Patient? No     Pain Assessment   Currently in Pain? No/denies   Multiple Pain Sites No      Capillary Blood Glucose: No results found for this or any previous visit (from the past 24 hour(s)).      Exercise Prescription Changes - 06/08/17 1200      Response to Exercise   Blood Pressure (Admit) 94/56   Blood Pressure (Exercise) 130/80   Blood Pressure (Exit) 100/60   Heart Rate (Admit) 65 bpm   Heart Rate (Exercise) 107 bpm   Heart Rate (Exit) 77 bpm   Oxygen Saturation (Admit) 94 %   Oxygen Saturation (Exercise) 93 %   Oxygen Saturation (Exit) 89 %   Rating of Perceived Exertion (Exercise) 13   Perceived Dyspnea (Exercise) 2   Duration Continue with 45 min of aerobic exercise without signs/symptoms of physical distress.   Intensity THRR unchanged     Progression   Progression Continue to progress workloads to maintain intensity without signs/symptoms of physical distress.     Resistance Training   Training Prescription Yes   Weight orange bands   Reps 10-15   Time 10 Minutes     Oxygen   Oxygen Continuous   Liters 25     Treadmill   MPH 2   Grade 5   Minutes 34      History  Smoking Status  . Former Smoker  . Packs/day: 1.00  . Years: 18.00  . Quit date: 09/27/1985  Smokeless Tobacco  . Never Used    Goals Met:  Exercise tolerated well No report of cardiac concerns or symptoms Strength training completed today  Goals Unmet:  Not Applicable  Comments: Service time is from 10:30a to 12:30p    Dr. Rush Farmer is Medical Director for Pulmonary Rehab at [facility].

## 2017-06-09 ENCOUNTER — Encounter: Payer: Self-pay | Admitting: Pulmonary Disease

## 2017-06-09 ENCOUNTER — Encounter (HOSPITAL_COMMUNITY): Payer: Self-pay | Admitting: Internal Medicine

## 2017-06-10 ENCOUNTER — Encounter (HOSPITAL_COMMUNITY)
Admission: RE | Admit: 2017-06-10 | Discharge: 2017-06-10 | Disposition: A | Discharge status: Home / Self Care | Payer: Medicare Other | Source: Ambulatory Visit | Attending: Internal Medicine | Admitting: Internal Medicine

## 2017-06-10 ENCOUNTER — Encounter: Payer: Self-pay | Admitting: Pulmonary Disease

## 2017-06-10 VITALS — Wt 170.2 lb

## 2017-06-10 DIAGNOSIS — I272 Pulmonary hypertension, unspecified: Secondary | ICD-10-CM

## 2017-06-10 NOTE — Progress Notes (Signed)
Daily Session Note  Patient Details  Name: [name] MRN: [id number] Date of Birth: [birth year] Referring Provider:     Pulmonary Rehab Walk Test from 03/23/2017 in [place]  Referring Provider  Dr. Haroldine Laws      Encounter Date: 06/10/2017  Check In:     Session Check In - 06/10/17 1018      Check-In   Location [facility]   Staff Present Trish Fountain, RN, BSN;Lisa Ysidro Evert, RN;Osha Errico, MS, ACSM RCEP, Exercise Physiologist   Supervising physician immediately available to respond to emergencies [facility] immediately available   Physician(s) Dr. Verlon Au   Medication changes reported     No   Fall or balance concerns reported    No   Tobacco Cessation No Change   Warm-up and Cool-down Performed as group-led instruction   Resistance Training Performed Yes   VAD Patient? No     Pain Assessment   Currently in Pain? No/denies   Multiple Pain Sites No      Capillary Blood Glucose: No results found for this or any previous visit (from the past 24 hour(s)).      Exercise Prescription Changes - 06/10/17 1200      Response to Exercise   Blood Pressure (Admit) 96/62   Blood Pressure (Exercise) 148/68   Blood Pressure (Exit) 114/66   Heart Rate (Admit) 61 bpm   Heart Rate (Exercise) 115 bpm   Heart Rate (Exit) 72 bpm   Oxygen Saturation (Admit) 93 %   Oxygen Saturation (Exercise) 95 %   Oxygen Saturation (Exit) 97 %   Rating of Perceived Exertion (Exercise) 12   Perceived Dyspnea (Exercise) 0   Duration Continue with 45 min of aerobic exercise without signs/symptoms of physical distress.   Intensity THRR unchanged     Progression   Progression Continue to progress workloads to maintain intensity without signs/symptoms of physical distress.     Resistance Training   Training Prescription Yes   Weight orange bands   Reps 10-15   Time 10 Minutes     Oxygen   Oxygen Continuous   Liters 25     Treadmill   MPH 2.9   Grade 5   Minutes 34     NuStep   Level 5   Minutes 17   METs 2.1      History  Smoking Status  . Former Smoker  . Packs/day: 1.00  . Years: 18.00  . Quit date: 09/27/1985  Smokeless Tobacco  . Never Used    Goals Met:  Exercise tolerated well No report of cardiac concerns or symptoms Strength training completed today  Goals Unmet:  Not Applicable  Comments: Service time is from 10:30a to 12:10p    Dr. Rush Farmer is Medical Director for Pulmonary Rehab at [facility].

## 2017-06-11 ENCOUNTER — Ambulatory Visit: Payer: Medicare Other | Admitting: Adult Health

## 2017-06-14 ENCOUNTER — Telehealth: Payer: Self-pay | Admitting: Pulmonary Disease

## 2017-06-14 DIAGNOSIS — J9611 Chronic respiratory failure with hypoxia: Secondary | ICD-10-CM

## 2017-06-14 DIAGNOSIS — J440 Chronic obstructive pulmonary disease with acute lower respiratory infection: Secondary | ICD-10-CM

## 2017-06-14 NOTE — Addendum Note (Signed)
Addended by: Len Blalock on: 06/14/2017 04:35 PM   Modules accepted: Orders

## 2017-06-14 NOTE — Telephone Encounter (Signed)
I called [name] to discuss the letter I received from [facility] that stated he would not be considered for lung transplantation.  I believe the best approach at this point is to still have him evaluated at [facility] for his severe hypoxemia.  I have suggested to him that we have him seen by the [facility] clinic for his severe chronic respiratory failure with hypoxemia.    He is struggling with this process and is quite discouraged.  However he is agreeable to this plan.

## 2017-06-14 NOTE — Telephone Encounter (Signed)
Referral placed.  Nothing further needed.

## 2017-06-15 ENCOUNTER — Encounter (HOSPITAL_COMMUNITY)
Admission: RE | Admit: 2017-06-15 | Discharge: 2017-06-15 | Disposition: A | Discharge status: Home / Self Care | Payer: Medicare Other | Source: Ambulatory Visit | Attending: Internal Medicine | Admitting: Internal Medicine

## 2017-06-15 VITALS — Wt 171.1 lb

## 2017-06-15 DIAGNOSIS — I272 Pulmonary hypertension, unspecified: Secondary | ICD-10-CM

## 2017-06-15 NOTE — Progress Notes (Signed)
Daily Session Note  Patient Details  Name: [name] MRN: [id number] Date of Birth: Nov 23, 1945 Referring Provider:     Pulmonary Rehab Walk Test from 03/23/2017 in [place]  Referring Provider  Dr. Haroldine Laws      Encounter Date: 06/15/2017  Check In:     Session Check In - 06/15/17 1021      Check-In   Location [facility]   Staff Present Su Hilt, MS, ACSM RCEP, Exercise Physiologist;Lisa Ysidro Evert, RN;Portia Rollene Rotunda, RN, BSN   Supervising physician immediately available to respond to emergencies [facility] immediately available   Physician(s) Dr. Verlon Au   Medication changes reported     No   Fall or balance concerns reported    No   Tobacco Cessation No Change   Warm-up and Cool-down Performed as group-led instruction   Resistance Training Performed Yes   VAD Patient? No     Pain Assessment   Currently in Pain? No/denies   Multiple Pain Sites No      Capillary Blood Glucose: No results found for this or any previous visit (from the past 24 hour(s)).      Exercise Prescription Changes - 06/15/17 1200      Response to Exercise   Blood Pressure (Admit) 90/44   Blood Pressure (Exercise) 122/64   Blood Pressure (Exit) 112/68   Heart Rate (Admit) 78 bpm   Heart Rate (Exercise) 100 bpm   Heart Rate (Exit) 60 bpm   Oxygen Saturation (Admit) 93 %   Oxygen Saturation (Exercise) 85 %   Oxygen Saturation (Exit) 93 %   Rating of Perceived Exertion (Exercise) 12   Perceived Dyspnea (Exercise) 1   Duration Continue with 45 min of aerobic exercise without signs/symptoms of physical distress.   Intensity THRR unchanged     Progression   Progression Continue to progress workloads to maintain intensity without signs/symptoms of physical distress.     Resistance Training   Training Prescription Yes   Weight orange bands   Reps 10-15   Time 10 Minutes     Oxygen   Oxygen Continuous   Liters 25     Treadmill   MPH 2.2  patient not feeling well today    Grade 2   Minutes 34     Bike   Level 1.4   Minutes 17      History  Smoking Status  . Former Smoker  . Packs/day: 1.00  . Years: 18.00  . Quit date: 09/27/1985  Smokeless Tobacco  . Never Used    Goals Met:  Exercise tolerated well No report of cardiac concerns or symptoms Strength training completed today  Goals Unmet:  Not Applicable  Comments: Service time is from 10:30a to 12:10p    Dr. Rush Farmer is Medical Director for Pulmonary Rehab at [facility].

## 2017-06-17 ENCOUNTER — Encounter (HOSPITAL_COMMUNITY)
Admission: RE | Admit: 2017-06-17 | Discharge: 2017-06-17 | Disposition: A | Discharge status: Home / Self Care | Payer: Medicare Other | Source: Ambulatory Visit | Attending: Internal Medicine | Admitting: Internal Medicine

## 2017-06-17 VITALS — Wt 170.2 lb

## 2017-06-17 DIAGNOSIS — I272 Pulmonary hypertension, unspecified: Secondary | ICD-10-CM | POA: Diagnosis not present

## 2017-06-17 NOTE — Progress Notes (Signed)
Daily Session Note  Patient Details  Name: [name] MRN: [id number] Date of Birth: September 28, 1945 Referring Provider:     Pulmonary Rehab Walk Test from 03/23/2017 in [place]  Referring Provider  Dr. Haroldine Laws      Encounter Date: 06/17/2017  Check In:     Session Check In - 06/17/17 1024      Check-In   Location [facility]   Staff Present Su Hilt, MS, ACSM RCEP, Exercise Physiologist;Lisa Ysidro Evert, RN;Shamika Pedregon Rollene Rotunda, RN, BSN   Supervising physician immediately available to respond to emergencies [facility] immediately available   Physician(s) Dr. Horris Latino   Medication changes reported     No   Fall or balance concerns reported    No   Tobacco Cessation No Change   Warm-up and Cool-down Performed as group-led instruction   Resistance Training Performed Yes   VAD Patient? No     Pain Assessment   Currently in Pain? No/denies   Multiple Pain Sites No      Capillary Blood Glucose: No results found for this or any previous visit (from the past 24 hour(s)).      Exercise Prescription Changes - 06/17/17 1254      Response to Exercise   Blood Pressure (Admit) 102/58   Blood Pressure (Exercise) 146/80   Blood Pressure (Exit) 114/70   Heart Rate (Admit) 54 bpm   Heart Rate (Exercise) 104 bpm   Heart Rate (Exit) 69 bpm   Oxygen Saturation (Admit) 92 %   Oxygen Saturation (Exercise) 98 %   Oxygen Saturation (Exit) 96 %   Rating of Perceived Exertion (Exercise) 14   Perceived Dyspnea (Exercise) 2   Duration Continue with 45 min of aerobic exercise without signs/symptoms of physical distress.   Intensity THRR unchanged     Progression   Progression Continue to progress workloads to maintain intensity without signs/symptoms of physical distress.     Resistance Training   Training Prescription Yes   Weight orange bands   Reps 10-15   Time 10 Minutes     Oxygen   Oxygen Continuous   Liters 25     Treadmill   MPH 3   Grade 5   Minutes 34      History  Smoking Status  . Former Smoker  . Packs/day: 1.00  . Years: 18.00  . Quit date: 09/27/1985  Smokeless Tobacco  . Never Used    Goals Met:  Independence with exercise equipment Improved SOB with ADL's Using PLB without cueing & demonstrates good technique Exercise tolerated well No report of cardiac concerns or symptoms Strength training completed today  Goals Unmet:  Not Applicable  Comments: Service time is from 1030 to 1215   Dr. Rush Farmer is Medical Director for Pulmonary Rehab at [facility].

## 2017-06-21 NOTE — Progress Notes (Signed)
Pulmonary Individual Treatment Plan  Patient Details  Name: [name] MRN: [id number] Date of Birth: Jun 02, 1946 Referring Provider:     Pulmonary Rehab Walk Test from 03/23/2017 in [place]  Referring Provider  Dr. Haroldine Laws      Initial Encounter Date:    Pulmonary Rehab Walk Test from 03/23/2017 in [place]  Date  03/25/17  Referring Provider  Dr. Haroldine Laws      Visit Diagnosis: Pulmonary hypertension ([facility])  Patient's Home Medications on Admission:   Current Outpatient Prescriptions:  .  aspirin 81 MG tablet, Take 81 mg by mouth every evening. , Disp: , Rfl:  .  Azelastine-Fluticasone (DYMISTA) 137-50 MCG/ACT SUSP, Place 1-2 sprays into the nose daily., Disp: 1 Bottle, Rfl: 5 .  clonazePAM (KLONOPIN) 0.5 MG tablet, Take 0.5 mg by mouth 2 (two) times daily as needed for anxiety. , Disp: , Rfl: 0 .  Coenzyme Q10 (VITALINE COQ10) 60 MG TABS, Take 60 mg by mouth daily., Disp: , Rfl:  .  ezetimibe (ZETIA) 10 MG tablet, Take 1 tablet (10 mg total) by mouth every evening., Disp: 90 tablet, Rfl: 3 .  Fluticasone-Umeclidin-Vilant (TRELEGY ELLIPTA) 100-62.5-25 MCG/INH AEPB, Inhale 1 puff into the lungs daily., Disp: 60 each, Rfl: 5 .  ibuprofen (ADVIL,MOTRIN) 200 MG tablet, Take 400 mg by mouth 2 (two) times daily as needed for headache or moderate pain., Disp: , Rfl:  .  ipratropium (ATROVENT) 0.03 % nasal spray, Place 2 sprays into both nostrils as directed., Disp: 30 mL, Rfl: 5 .  Lactobacillus (ACIDOPHILUS PO), Take 1 capsule by mouth daily. , Disp: , Rfl:  .  Macitentan (OPSUMIT) 10 MG TABS, Take 1 tablet (10 mg total) by mouth daily., Disp: 30 tablet, Rfl: 11 .  mirtazapine (REMERON) 15 MG tablet, Take 15 mg by mouth at bedtime., Disp: , Rfl:  .  Multiple Vitamins-Minerals (CENTRUM CARDIO PO), Take 1 tablet by mouth daily. , Disp: , Rfl:  .  Omega-3 Fatty Acids (OMEGA 3 PO), Take 3,200 mg by mouth 2 (two) times  daily. Nordic Natural ultimate Omega 3 , Disp: , Rfl:  .  rosuvastatin (CRESTOR) 40 MG tablet, Take 1 tablet (40 mg total) by mouth daily., Disp: 90 tablet, Rfl: 3 .  vitamin B-12 (CYANOCOBALAMIN) 1000 MCG tablet, Take 1,000 mcg by mouth daily., Disp: , Rfl:   Past Medical History: Past Medical History:  Diagnosis Date  . CAD (coronary artery disease)   . GAD (generalized anxiety disorder)   . Hyperlipidemia   . Overweight(278.02)   . Pulmonary hypertension ([facility])     Tobacco Use: History  Smoking Status  . Former Smoker  . Packs/day: 1.00  . Years: 18.00  . Quit date: 09/27/1985  Smokeless Tobacco  . Never Used    Labs: Recent Review Flowsheet Data    Labs for ITP Cardiac and Pulmonary Rehab Latest Ref Rng & Units 11/25/2015 06/12/2016 11/24/2016 11/24/2016 12/02/2016   Cholestrol <200 mg/dL 118(L) 101 - - -   LDLCALC <100 mg/dL 50 47 - - -   HDL >40 mg/dL 37(L) 37(L) - - -   Trlycerides <150 mg/dL 155(H) 86 - - -   PHART 7.350 - 7.450 - - 7.416 - -   PCO2ART 32.0 - 48.0 mmHg - - 32.2 - -   HCO3 20.0 - 28.0 mmol/L - - 20.7 21.9 -   TCO2 0 - 100 mmol/L - - _0 ACIDBASEDEF 0.0 -  2.0 mmol/L - - 3.0(H) 2.0 -   O2SAT % - - 90.0 67.0 -      Capillary Blood Glucose: No results found for: GLUCAP   Pulmonary Assessment Scores:     Pulmonary Assessment Scores    Row Name 03/25/17 0700 03/31/17 0825       ADL UCSD   ADL Phase Entry Entry    SOB Score total  - 35      CAT Score   CAT Score  - 10  Entry      mMRC Score   mMRC Score 2  -       Pulmonary Function Assessment:   Exercise Target Goals:    Exercise Program Goal: Individual exercise prescription set with THRR, safety & activity barriers. Participant demonstrates ability to understand and report RPE using BORG scale, to self-measure pulse accurately, and to acknowledge the importance of the exercise prescription.  Exercise Prescription Goal: Starting with aerobic activity 30 plus minutes a day,  3 days per week for initial exercise prescription. Provide home exercise prescription and guidelines that participant acknowledges understanding prior to discharge.  Activity Barriers & Risk Stratification:   6 Minute Walk:     6 Minute Walk    Row Name 03/23/17 1712         6 Minute Walk   Phase Initial     Distance 853 feet     Walk Time 5.45 minutes     # of Rest Breaks 1     MPH 1.78     METS 1.43     RPE 11     Perceived Dyspnea  1     VO2 Peak 5.01     Symptoms No     Resting HR 64 bpm     Resting BP 110/70     Max Ex. HR 73 bpm     Max Ex. BP 106/70     2 Minute Post BP 98/64       Interval HR   Baseline HR (retired) 92     1 Minute HR 71     2 Minute HR 68     3 Minute HR 54     4 Minute HR 60     5 Minute HR 73     6 Minute HR 71     2 Minute Post HR 55     Interval Heart Rate? Yes       Interval Oxygen   Interval Oxygen? Yes     Baseline Oxygen Saturation % 92 %     Resting Liters of Oxygen 2 L     1 Minute Oxygen Saturation % 85 %  increased to 87      1 Minute Liters of Oxygen 2 L     2 Minute Oxygen Saturation % 86 %  short rest break 15 seconds, incr. O2-->91%     2 Minute Liters of Oxygen 4 L     3 Minute Oxygen Saturation % 92 %     3 Minute Liters of Oxygen 6 L     4 Minute Oxygen Saturation % 91 %     4 Minute Liters of Oxygen 6 L     5 Minute Oxygen Saturation % 90 %     5 Minute Liters of Oxygen 6 L     6 Minute Oxygen Saturation % 90 %     6 Minute Liters of Oxygen 6 L     2 Minute Post  Oxygen Saturation % 96 %     2 Minute Post Liters of Oxygen 3 L        Oxygen Initial Assessment:     Oxygen Initial Assessment - 03/25/17 0700      Initial 6 min Walk   Oxygen Used Continuous;E-Tanks   Liters per minute 6   Resting Oxygen Saturation  92 %  2liters   Exercise Oxygen Saturation  during 6 min walk 85 %  85-87% 2 liters 90% on 6 liters     Program Oxygen Prescription   Program Oxygen Prescription Continuous;E-Tanks    Liters per minute 6      Oxygen Re-Evaluation:     Oxygen Re-Evaluation    Row Name 04/28/17 1721 05/21/17 1209 06/20/17 2139         Program Oxygen Prescription   Program Oxygen Prescription Continuous;E-Tanks Continuous;E-Tanks Continuous;E-Tanks     Liters per minute _0 Comments order recieved to maintain saturations>85% order recieved to maintain saturations>85% order recieved to maintain saturations>85%. utilizing a non-rebreather to eliminate nasal drying from pendant       Home Oxygen   Home Oxygen Device E-Tanks;Home Concentrator E-Tanks;Home Concentrator E-Tanks;Home Concentrator     Sleep Oxygen Prescription Continuous Continuous Continuous     Liters per minute _1 Home Exercise Oxygen Prescription Continuous Continuous Continuous     Liters per minute -  home exercise not complete until it is determined that patient has oxygen source sufficient to maintain high literflow  - 8  8 liters per MD at this time     Home at Rest Exercise Oxygen Prescription Continuous Continuous Continuous     Liters per minute 2.5 2  -     Compliance with Home Oxygen Use Yes Yes Yes       Goals/Expected Outcomes   Short Term Goals To learn and exhibit compliance with exercise, home and travel O2 prescription;To learn and understand importance of monitoring SPO2 with pulse oximeter and demonstrate accurate use of the pulse oximeter.;To learn and understand importance of maintaining oxygen saturations>88%;To learn and demonstrate proper pursed lip breathing techniques or other breathing techniques.;To learn and demonstrate proper use of respiratory medications To learn and exhibit compliance with exercise, home and travel O2 prescription;To learn and understand importance of monitoring SPO2 with pulse oximeter and demonstrate accurate use of the pulse oximeter.;To learn and understand importance of maintaining oxygen saturations>88%;To learn and demonstrate proper pursed lip  breathing techniques or other breathing techniques.;To learn and demonstrate proper use of respiratory medications To learn and exhibit compliance with exercise, home and travel O2 prescription;To learn and understand importance of monitoring SPO2 with pulse oximeter and demonstrate accurate use of the pulse oximeter.;To learn and understand importance of maintaining oxygen saturations>88%;To learn and demonstrate proper pursed lip breathing techniques or other breathing techniques.;To learn and demonstrate proper use of respiratory medications     Long  Term Goals Exhibits compliance with exercise, home and travel O2 prescription;Verbalizes importance of monitoring SPO2 with pulse oximeter and return demonstration;Maintenance of O2 saturations>88%;Exhibits proper breathing techniques, such as pursed lip breathing or other method taught during program session;Compliance with respiratory medication Exhibits compliance with exercise, home and travel O2 prescription;Verbalizes importance of monitoring SPO2 with pulse oximeter and return demonstration;Maintenance of O2 saturations>88%;Exhibits proper breathing techniques, such as pursed lip breathing or other method taught during program session;Compliance with respiratory medication Exhibits compliance with exercise, home and travel O2 prescription;Verbalizes importance of  monitoring SPO2 with pulse oximeter and return demonstration;Maintenance of O2 saturations>88%;Exhibits proper breathing techniques, such as pursed lip breathing or other method taught during program session;Compliance with respiratory medication     Comments  - patient has appointment with pulmonologist Sept 26 to discuss increased need for oxygen patient met with MD to discuss oxygen needs. Patient was referred to [facility] transplant clinic and was declined     Goals/Expected Outcomes patient will verbalize home oxygen compliance patient will verbalize home oxygen compliance patient will  verbalize home oxygen compliance        Oxygen Discharge (Final Oxygen Re-Evaluation):     Oxygen Re-Evaluation - 06/20/17 2139      Program Oxygen Prescription   Program Oxygen Prescription Continuous;E-Tanks   Liters per minute 25   Comments order recieved to maintain saturations>85%. utilizing a non-rebreather to eliminate nasal drying from pendant     Home Oxygen   Home Oxygen Device E-Tanks;Home Concentrator   Sleep Oxygen Prescription Continuous   Liters per minute 2   Home Exercise Oxygen Prescription Continuous   Liters per minute 8  8 liters per MD at this time   Home at Rest Exercise Oxygen Prescription Continuous   Compliance with Home Oxygen Use Yes     Goals/Expected Outcomes   Short Term Goals To learn and exhibit compliance with exercise, home and travel O2 prescription;To learn and understand importance of monitoring SPO2 with pulse oximeter and demonstrate accurate use of the pulse oximeter.;To learn and understand importance of maintaining oxygen saturations>88%;To learn and demonstrate proper pursed lip breathing techniques or other breathing techniques.;To learn and demonstrate proper use of respiratory medications   Long  Term Goals Exhibits compliance with exercise, home and travel O2 prescription;Verbalizes importance of monitoring SPO2 with pulse oximeter and return demonstration;Maintenance of O2 saturations>88%;Exhibits proper breathing techniques, such as pursed lip breathing or other method taught during program session;Compliance with respiratory medication   Comments patient met with MD to discuss oxygen needs. Patient was referred to [facility] transplant clinic and was declined   Goals/Expected Outcomes patient will verbalize home oxygen compliance      Initial Exercise Prescription:     Initial Exercise Prescription - 03/25/17 0700      Date of Initial Exercise RX and Referring Provider   Date 03/25/17   Referring Provider Dr. Haroldine Laws     Oxygen    Oxygen Continuous   Liters 6     Bike   Level 0.5   Minutes 17     NuStep   Level 2   Minutes 17   METs 1.5     Track   Laps 7   Minutes 17     Prescription Details   Frequency (times per week) 2   Duration Progress to 45 minutes of aerobic exercise without signs/symptoms of physical distress     Intensity   THRR 40-80% of Max Heartrate 60-119   Ratings of Perceived Exertion 11-13   Perceived Dyspnea 0-4     Progression   Progression Continue progressive overload as per policy without signs/symptoms or physical distress.     Resistance Training   Training Prescription Yes   Weight orange bands   Reps 10-15      Perform Capillary Blood Glucose checks as needed.  Exercise Prescription Changes:     Exercise Prescription Changes    Row Name 03/30/17 1200 04/01/17 1359 04/06/17 1200 04/08/17 1200 04/13/17 1200     Response to Exercise   Blood Pressure (Admit) 118/70  100/64 92/52 112/66 100/58   Blood Pressure (Exercise) 122/70  - 132/62 120/58 144/60   Blood Pressure (Exit) 96/62 114/62 98/64 105/69 104/60   Heart Rate (Admit) 59 bpm 55 bpm 66 bpm 60 bpm 59 bpm   Heart Rate (Exercise) 86 bpm 88 bpm 92 bpm 85 bpm 87 bpm   Heart Rate (Exit) 62 bpm 53 bpm 59 bpm 61 bpm 60 bpm   Oxygen Saturation (Admit) 98 % 98 % 96 % 92 % 96 %   Oxygen Saturation (Exercise) 84 % 84 % 87 % 86 % 85 %  with forehead probe   Oxygen Saturation (Exit) 94 % 94 % 93 % 93 % 99 %   Rating of Perceived Exertion (Exercise) _0 Perceived Dyspnea (Exercise) _1 0   Duration Continue with 45 min of aerobic exercise without signs/symptoms of physical distress. Continue with 45 min of aerobic exercise without signs/symptoms of physical distress. Continue with 45 min of aerobic exercise without signs/symptoms of physical distress. Continue with 45 min of aerobic exercise without signs/symptoms of physical distress. Continue with 45 min of aerobic exercise without signs/symptoms of  physical distress.   Intensity _2      Progression   Progression Continue to progress workloads to maintain intensity without signs/symptoms of physical distress. Continue to progress workloads to maintain intensity without signs/symptoms of physical distress. Continue to progress workloads to maintain intensity without signs/symptoms of physical distress. Continue to progress workloads to maintain intensity without signs/symptoms of physical distress. Continue to progress workloads to maintain intensity without signs/symptoms of physical distress.     Resistance Training   Training Prescription _3    Weight _4    Reps 10-15 10-15 10-15 10-15 10-15   Time 10 Minutes 10 Minutes 10 Minutes 10 Minutes 10 Minutes     Oxygen   Oxygen _5    Liters _6 8-10 8-10     Treadmill   MPH 1.6 2 2.3  - 2.2   Grade 0 0 2  - 0   Minutes _7 - 17     Bike   Level 0.5  - 0.6 0.6 0.8   Minutes 17  - _8 NuStep   Level 2  - _9 Minutes 17  - _10 METs 1.7  - 1.8 1.9 1.7   Row Name 04/15/17 1230 04/20/17 1200 04/22/17 1245 04/27/17 1231 04/29/17 1248     Response to Exercise   Blood Pressure (Admit) 100/66 100/60 1_11  reck 110/58   Blood Pressure (Exercise) 116/62 106/60 104/81 144/68 166/72   Blood Pressure (Exit) 98/50 104/70 112/60 100/66 108/64   Heart Rate (Admit) 60 bpm 61 bpm 57 bpm 55 bpm 64 bpm   Heart Rate (Exercise) 82 bpm 89 bpm 89 bpm 111 bpm 101 bpm   Heart Rate (Exit) 58 bpm 63 bpm 63 bpm 68 bpm 66 bpm   Oxygen Saturation (Admit) 97 % 99 % 98 % 98 % 96 %  2 liters   Oxygen Saturation (Exercise) 87 %  with forehead probe 76 %  Sat on TM down O2 on 6L increased to 10L. Oxymizer placed. 83 %  Sat on TM down O2 on 6L increased to 10L. Oxymizer  placed. 86 %  15 L 84 %  10L INCREASED TO 15L   Oxygen Saturation (Exit) 95 % 95 % 95 % 92 % 96 %  2 liters   Rating of Perceived Exertion (Exercise) _0 Perceived Dyspnea (Exercise) 0 _1 Duration Continue with 45 min of aerobic exercise without signs/symptoms of physical distress. Continue with 45 min of aerobic exercise without signs/symptoms of physical distress. Continue with 45 min of aerobic exercise without signs/symptoms of physical distress. Continue with 45 min of aerobic exercise without signs/symptoms of physical distress. Continue with 45 min of aerobic exercise without signs/symptoms of physical distress.   Intensity _2      Progression   Progression Continue to progress workloads to maintain intensity without signs/symptoms of physical distress. Continue to progress workloads to maintain intensity without signs/symptoms of physical distress. Continue to progress workloads to maintain intensity without signs/symptoms of physical distress. Continue to progress workloads to maintain intensity without signs/symptoms of physical distress. Continue to progress workloads to maintain intensity without signs/symptoms of physical distress.     Resistance Training   Training Prescription _3    Weight _4    Reps 10-15 10-15 10-15 10-15 10-15   Time 10 Minutes 10 Minutes 10 Minutes 10 Minutes 10 Minutes     Oxygen   Oxygen _5    Liters 8-10 8-10 8-_6 Treadmill   MPH 2.2 2.1 2.1 2.4  -   Grade 0 0 0 3  -   Minutes _7 -     Bike   Level 0.8 0.8 1 1.4 1  decreased workload per patients request. still desatuates   Minutes _8 NuStep   Level 3 4  - 4 4   Minutes 17 17  - 17 17   METs 1.7 1.8  - 2.8 2.8   Row Name 05/04/17 1200 05/06/17  1200 05/11/17 1200 05/13/17 1209 05/18/17 1600     Response to Exercise   Blood Pressure (Admit) _9 90/58 116/60   Blood Pressure (Exercise)  - 110/62 120/64 142/62 152/72   Blood Pressure (Exit) 100/50 98/58 106/60 116/68 120/60   Heart Rate (Admit) 62 bpm 59 bpm 67 bpm 68 bpm 61 bpm   Heart Rate (Exercise) 103 bpm 101 bpm 92 bpm 106 bpm 102 bpm   Heart Rate (Exit) 66 bpm 69 bpm 65 bpm 76 bpm 72 bpm   Oxygen Saturation (Admit) 99 % 93 % 92 % 90 % 99 %   Oxygen Saturation (Exercise) 81 %  O2 increased to 15 L sat up to 88 83 %  O2 increased to 15L sat up to 88% 84 %  O2 increased to 15L sat up to 88% 74 %  o2 increased to 25L and patient pulled off [employer] bike 85 %   Oxygen Saturation (Exit) 95 % 97 % 96 % 92 % 96 %   Rating of Perceived Exertion (Exercise) _10 Perceived Dyspnea (Exercise) _11 Duration Continue with 45 min of aerobic exercise without signs/symptoms of physical distress. Continue with 45 min of aerobic exercise without signs/symptoms of physical distress. Continue with 45 min of aerobic exercise without signs/symptoms  of physical distress. Continue with 45 min of aerobic exercise without signs/symptoms of physical distress. Continue with 45 min of aerobic exercise without signs/symptoms of physical distress.   Intensity _0      Progression   Progression Continue to progress workloads to maintain intensity without signs/symptoms of physical distress. Continue to progress workloads to maintain intensity without signs/symptoms of physical distress. Continue to progress workloads to maintain intensity without signs/symptoms of physical distress. Continue to progress workloads to maintain intensity without signs/symptoms of physical distress. Continue to progress workloads to maintain intensity without signs/symptoms of physical distress.     Resistance Training   Training  Prescription _1    Weight _2    Reps 10-15 10-15 10-15 10-15 10-15   Time 10 Minutes 10 Minutes 10 Minutes 10 Minutes 10 Minutes     Oxygen   Oxygen _3    Liters _4 Treadmill   MPH 2.4 2.4 2.4 2.4 2.4   Grade 0 0 _5 Minutes _6 Bike   Level 1.4 1.4  - 1.4 1.4   Minutes 17 17  -  -  -     NuStep   Level 4  - _7 Minutes 17  - _8 METs  -  - 2.5 2.7 2.9   Row Name 05/20/17 1200 05/25/17 1226 05/27/17 1200 06/01/17 1200 06/03/17 1200     Response to Exercise   Blood Pressure (Admit) 104/60 108/64 116/64 112/62 110/70   Blood Pressure (Exercise) 134/66 158/70 130/68 128/70 120/72   Blood Pressure (Exit) 106/60 106/60 118/70 114/72 112/70   Heart Rate (Admit) 76 bpm 65 bpm 62 bpm 70 bpm 63 bpm   Heart Rate (Exercise) 108 bpm 112 bpm 109 bpm 98 bpm 103 bpm   Heart Rate (Exit) 65 bpm 79 bpm 75 bpm 67 bpm 65 bpm   Oxygen Saturation (Admit) 96 % 95 % 94 % 96 % 100 %   Oxygen Saturation (Exercise) 86 %  15 liters moved to 25liters 85 % 86 % 95 % 93 %   Oxygen Saturation (Exit) 90 % 89 % 92 % 92 % 98 %   Rating of Perceived Exertion (Exercise) _9 Perceived Dyspnea (Exercise) _10 Duration Continue with 45 min of aerobic exercise without signs/symptoms of physical distress. Continue with 45 min of aerobic exercise without signs/symptoms of physical distress. Continue with 45 min of aerobic exercise without signs/symptoms of physical distress. Continue with 45 min of aerobic exercise without signs/symptoms of physical distress. Continue with 45 min of aerobic exercise without signs/symptoms of physical distress.   Intensity _11      Progression   Progression Continue to progress workloads to maintain intensity without  signs/symptoms of physical distress. Continue to progress workloads to maintain intensity without signs/symptoms of physical distress. Continue to progress workloads to maintain intensity without signs/symptoms of physical distress. Continue to progress workloads to maintain intensity without signs/symptoms of physical distress. Continue to progress workloads to maintain intensity without signs/symptoms of physical distress.     Resistance Training   Training Prescription _12    Weight orange bands  orange bands orange bands orange bands orange bands   Reps 10-15 10-15 10-15 10-15 10-15   Time 10 Minutes 10 Minutes 10 Minutes 10 Minutes 10 Minutes     Oxygen   Oxygen Continuous Continuous  - Continuous Continuous   Liters 25 25  - 25 25     Treadmill   MPH  - 2.4  - 2.4 2.4   Grade  - 3  - 3 3   Minutes  - 17  - 17 34     Bike   Level 1.4 1.4 1.1 1  -   Minutes _0 -     NuStep   Level 5 5 2.4 5  -   Minutes _1 -   METs 2.5 2.8 3 2.1  -   Row Name 06/08/17 1200 06/10/17 1200 06/15/17 1200 06/17/17 1254       Response to Exercise   Blood Pressure (Admit) _2 102/58    Blood Pressure (Exercise) 130/80 148/68 122/64 146/80    Blood Pressure (Exit) 100/60 114/66 112/68 114/70    Heart Rate (Admit) 65 bpm 61 bpm 78 bpm 54 bpm    Heart Rate (Exercise) 107 bpm 115 bpm 100 bpm 104 bpm    Heart Rate (Exit) 77 bpm 72 bpm 60 bpm 69 bpm    Oxygen Saturation (Admit) 94 % 93 % 93 % 92 %    Oxygen Saturation (Exercise) 93 % 95 % 85 % 98 %    Oxygen Saturation (Exit) 89 % 97 % 93 % 96 %    Rating of Perceived Exertion (Exercise) _3 Perceived Dyspnea (Exercise) 2 0 1 2    Duration Continue with 45 min of aerobic exercise without signs/symptoms of physical distress. Continue with 45 min of aerobic exercise without signs/symptoms of physical distress. Continue with 45 min of aerobic exercise without signs/symptoms of physical distress.  Continue with 45 min of aerobic exercise without signs/symptoms of physical distress.    Intensity THRR unchanged THRR unchanged THRR unchanged THRR unchanged      Progression   Progression Continue to progress workloads to maintain intensity without signs/symptoms of physical distress. Continue to progress workloads to maintain intensity without signs/symptoms of physical distress. Continue to progress workloads to maintain intensity without signs/symptoms of physical distress. Continue to progress workloads to maintain intensity without signs/symptoms of physical distress.      Resistance Training   Training Prescription Yes Yes Yes Yes    Weight orange bands orange bands orange bands orange bands    Reps 10-15 10-15 10-15 10-15    Time 10 Minutes 10 Minutes 10 Minutes 10 Minutes      Oxygen   Oxygen Continuous Continuous Continuous Continuous    Liters _4 Treadmill   MPH 2 2.9 2.2  patient not feeling well today  3    Grade _5 Minutes 34 34 34 34      Bike   Level  -  - 1.4  -    Minutes  -  - 17  -      NuStep   Level  - 5  -  -    Minutes  - 17  -  -    METs  - 2.1  -  -       Exercise Comments:   Exercise  Goals and Review:     Exercise Goals    Row Name 03/19/17 1125             Exercise Goals   Increase Physical Activity Yes       Intervention Provide advice, education, support and counseling about physical activity/exercise needs.;Develop an individualized exercise prescription for aerobic and resistive training based on initial evaluation findings, risk stratification, comorbidities and participant's personal goals.       Expected Outcomes Achievement of increased cardiorespiratory fitness and enhanced flexibility, muscular endurance and strength shown through measurements of functional capacity and personal statement of participant.       Increase Strength and Stamina Yes       Intervention Provide advice, education, support and  counseling about physical activity/exercise needs.;Develop an individualized exercise prescription for aerobic and resistive training based on initial evaluation findings, risk stratification, comorbidities and participant's personal goals.       Expected Outcomes Achievement of increased cardiorespiratory fitness and enhanced flexibility, muscular endurance and strength shown through measurements of functional capacity and personal statement of participant.          Exercise Goals Re-Evaluation :     Exercise Goals Re-Evaluation    Row Name 03/26/17 1527 04/27/17 0814 05/17/17 1151 06/21/17 1006       Exercise Goal Re-Evaluation   Exercise Goals Review Increase Strenth and Stamina;Increase Physical Activity Increase Physical Activity;Increase Strength and Stamina;Able to understand and use Dyspnea scale;Able to understand and use rate of perceived exertion (RPE) scale;Knowledge and understanding of Target Heart Rate Range (THRR);Understanding of Exercise Prescription Increase Physical Activity;Increase Strength and Stamina;Able to understand and use Dyspnea scale;Knowledge and understanding of Target Heart Rate Range (THRR);Able to understand and use rate of perceived exertion (RPE) scale;Understanding of Exercise Prescription Increase Strength and Stamina;Increase Physical Activity;Able to understand and use Dyspnea scale;Able to understand and use rate of perceived exertion (RPE) scale;Knowledge and understanding of Target Heart Rate Range (THRR);Understanding of Exercise Prescription    Comments Patient has only completed his 77mt. Will cont. to monitor and progress as able.  Patient has been desatruating at low workloads. Just receieved order from Dr. to increase liter flow to maintain oxygen saturation above 85%. Will now be able to increase workloads.  Patient has been increased to 25 liters during certain exercises. This causes him mental stress. Will cont. to monitor and progress as able on  higher flows.   Patient is exercising consistently at his threshold. 25 liters most of the time. Open to workload changes. Will cont. to monitor and progress as able.     Expected Outcomes Through the exercise at rehab and at home, patient will increase physical activity, strength, and stamina.  Through the exercise at rehab and at home, patient will increase physical activity, strength, and stamina.  Through the exercise at rehab and at home, patient will increase physical activity, strength, and stamina.  Through the exercise at rehab and at home, patient will increase physical activity, strength, and stamina.        Discharge Exercise Prescription (Final Exercise Prescription Changes):     Exercise Prescription Changes - 06/17/17 1254      Response to Exercise   Blood Pressure (Admit) 102/58   Blood Pressure (Exercise) 146/80   Blood Pressure (Exit) 114/70   Heart Rate (Admit) 54 bpm   Heart Rate (Exercise) 104 bpm   Heart Rate (Exit) 69 bpm   Oxygen Saturation (Admit) 92 %   Oxygen Saturation (Exercise) 98 %  Oxygen Saturation (Exit) 96 %   Rating of Perceived Exertion (Exercise) 14   Perceived Dyspnea (Exercise) 2   Duration Continue with 45 min of aerobic exercise without signs/symptoms of physical distress.   Intensity THRR unchanged     Progression   Progression Continue to progress workloads to maintain intensity without signs/symptoms of physical distress.     Resistance Training   Training Prescription Yes   Weight orange bands   Reps 10-15   Time 10 Minutes     Oxygen   Oxygen Continuous   Liters 25     Treadmill   MPH 3   Grade 5   Minutes 34      Nutrition:  Target Goals: Understanding of nutrition guidelines, daily intake of sodium <1556m, cholesterol <207m calories 30% from fat and 7% or less from saturated fats, daily to have 5 or more servings of fruits and vegetables.  Biometrics:     Pre Biometrics - 03/25/17 0700      Pre Biometrics    Grip Strength 36 kg       Nutrition Therapy Plan and Nutrition Goals:     Nutrition Therapy & Goals - 04/29/17 1356      Nutrition Therapy   Diet TLC     Personal Nutrition Goals   Nutrition Goal Describe the benefit of including fruits, vegetables, whole grains, and low-fat dairy products in a healthy meal plan.   Personal Goal #2 Encourage wt gain to a minimum wt gain goal of 180 lb and long-term wt gain goal of ~195 lb.      Intervention Plan   Intervention Prescribe, educate and counsel regarding individualized specific dietary modifications aiming towards targeted core components such as weight, hypertension, lipid management, diabetes, heart failure and other comorbidities.;Nutrition handout(s) given to patient.  Handouts given for High Calorie, High Protein diet, recipes, and Suggestions for increasing calorie and protein intake   Expected Outcomes Short Term Goal: Understand basic principles of dietary content, such as calories, fat, sodium, cholesterol and nutrients.;Long Term Goal: Adherence to prescribed nutrition plan.      Nutrition Discharge: Rate Your Plate Scores:     Nutrition Assessments - 04/29/17 1357      Rate Your Plate Scores   Pre Score 64      Nutrition Goals Re-Evaluation:   Nutrition Goals Discharge (Final Nutrition Goals Re-Evaluation):   Psychosocial: Target Goals: Acknowledge presence or absence of significant depression and/or stress, maximize coping skills, provide positive support system. Participant is able to verbalize types and ability to use techniques and skills needed for reducing stress and depression.  Initial Review & Psychosocial Screening:     Initial Psych Review & Screening - 03/19/17 1149      Initial Review   Current issues with Current Depression     Family Dynamics   Good Support System? Yes     Barriers   Psychosocial barriers to participate in program Psychosocial barriers identified (see note);The patient  should benefit from training in stress management and relaxation.     Screening Interventions   Interventions Yes;To provide support and resources with identified psychosocial needs;Provide feedback about the scores to participant;Program counselor consult;Encouraged to exercise   Expected Outcomes Short Term goal: Utilizing psychosocial counselor, staff and physician to assist with identification of specific Stressors or current issues interfering with healing process. Setting desired goal for each stressor or current issue identified.      Quality of Life Scores:   PHQ-9: Recent Review Flowsheet Data  Depression screen PHQ 2/9 03/19/2017   Decreased Interest 0   Down, Depressed, Hopeless 2   PHQ - 2 Score 2   Altered sleeping 0   Tired, decreased energy 1   Change in appetite 0   Feeling bad or failure about yourself  2   Trouble concentrating 0   Moving slowly or fidgety/restless 0   Suicidal thoughts 0   PHQ-9 Score 5   Difficult doing work/chores Not difficult at all     Interpretation of Total Score  Total Score Depression Severity:  1-4 = Minimal depression, 5-9 = Mild depression, 10-14 = Moderate depression, 15-19 = Moderately severe depression, 20-27 = Severe depression   Psychosocial Evaluation and Intervention:     Psychosocial Evaluation - 03/19/17 1159      Psychosocial Evaluation & Interventions   Comments (P)  referral to [facility]  (P)  Follow up required by staff      Psychosocial Re-Evaluation:     Psychosocial Re-Evaluation    [place] Name 04/28/17 1728 05/21/17 1212 06/20/17 2145         Psychosocial Re-Evaluation   Current issues with Current Stress Concerns;Current Depression;History of Depression;Current Anxiety/Panic;Current Sleep Concerns Current Stress Concerns;Current Depression;History of Depression;Current Anxiety/Panic;Current Sleep Concerns Current Depression;History of Depression;Current Sleep  Concerns;Current Stress Concerns     Comments patient has significant depression. it is affecting his participation in rehab. he has been referred to [facility] and sees him on a regular basis for counceling patient has significant depression. it is affecting his participation in rehab. he has been referred to [facility] and sees him on a regular basis for counceling. He has been placed on an antidepressant by his pcp and seems to be happier and more positive in rehab patient felt he was beginning to see a positive difference in his mood until he was declined for transplant. now he is not pushing himself as hard in the gym and he is reluctant to workload increases. he continues to meet with a counselor weekly     Expected Outcomes patient will have a reduction in his psychosocial barrier symptoms (a reduction in tearfulness while at rehab) patient will have a reduction in his psychosocial barrier symptoms (a reduction in tearfulness while at rehab) patient will have a reduction in his psychosocial barrier symptoms (a reduction in tearfulness while at rehab)     Interventions Physician referral;Therapist referral;Encouraged to attend Pulmonary Rehabilitation for the exercise Physician referral;Therapist referral;Encouraged to attend Pulmonary Rehabilitation for the exercise Physician referral;Therapist referral;Encouraged to attend Pulmonary Rehabilitation for the exercise     Continue Psychosocial Services  Follow up required by staff Follow up required by staff Follow up required by staff       Initial Review   Source of Stress Concerns Chronic Illness;Family;Unable to participate in former interests or hobbies;Unable to perform yard/household activities;Retirement/disability Chronic Illness;Family;Unable to participate in former interests or hobbies;Unable to perform yard/household activities;Retirement/disability Chronic Illness;Family;Unable to participate in former interests or hobbies;Unable to  perform yard/household activities;Retirement/disability        Psychosocial Discharge (Final Psychosocial Re-Evaluation):     Psychosocial Re-Evaluation - 06/20/17 2145      Psychosocial Re-Evaluation   Current issues with Current Depression;History of Depression;Current Sleep Concerns;Current Stress Concerns   Comments patient felt he was beginning to see a positive difference in his mood until he was declined for transplant. now he is not pushing himself as hard in the gym and he is reluctant to  workload increases. he continues to meet with a counselor weekly   Expected Outcomes patient will have a reduction in his psychosocial barrier symptoms (a reduction in tearfulness while at rehab)   Interventions Physician referral;Therapist referral;Encouraged to attend Pulmonary Rehabilitation for the exercise   Continue Psychosocial Services  Follow up required by staff     Initial Review   Source of Stress Concerns Chronic Illness;Family;Unable to participate in former interests or hobbies;Unable to perform yard/household activities;Retirement/disability      Education: Education Goals: Education classes will be provided on a weekly basis, covering required topics. Participant will state understanding/return demonstration of topics presented.  Learning Barriers/Preferences:     Learning Barriers/Preferences - 03/19/17 1147      Learning Barriers/Preferences   Learning Barriers None   Learning Preferences Written Material;Skilled Demonstration;Individual Instruction;Group Instruction      Education Topics: Risk Factor Reduction:  -Group instruction that is supported by a PowerPoint presentation. Instructor discusses the definition of a risk factor, different risk factors for pulmonary disease, and how the heart and lungs work together.     Nutrition for Pulmonary Patient:  -Group instruction provided by PowerPoint slides, verbal discussion, and written materials to support subject  matter. The instructor gives an explanation and review of healthy diet recommendations, which includes a discussion on weight management, recommendations for fruit and vegetable consumption, as well as protein, fluid, caffeine, fiber, sodium, sugar, and alcohol. Tips for eating when patients are short of breath are discussed.   PULMONARY REHAB OTHER RESPIRATORY from 06/17/2017 in [place]  Date  06/17/17  Educator  Nutritionist  Instruction Review Code  2- meets goals/outcomes      Pursed Lip Breathing:  -Group instruction that is supported by demonstration and informational handouts. Instructor discusses the benefits of pursed lip and diaphragmatic breathing and detailed demonstration on how to preform both.     PULMONARY REHAB OTHER RESPIRATORY from 06/17/2017 in [place]  Date  05/06/17  Educator  RT  Instruction Review Code  2- meets goals/outcomes      Oxygen Safety:  -Group instruction provided by PowerPoint, verbal discussion, and written material to support subject matter. There is an overview of "What is Oxygen" and "Why do we need it".  Instructor also reviews how to create a safe environment for oxygen use, the importance of using oxygen as prescribed, and the risks of noncompliance. There is a brief discussion on traveling with oxygen and resources the patient may utilize.   PULMONARY REHAB OTHER RESPIRATORY from 06/17/2017 in [place]  Date  04/08/17  Educator  Truddie Crumble  Instruction Review Code  2- meets goals/outcomes      Oxygen Equipment:  -Group instruction provided by [employer] Staff utilizing handouts, written materials, and equipment demonstrations.   Signs and Symptoms:  -Group instruction provided by written material and verbal discussion to support subject matter. Warning signs and symptoms of infection, stroke, and heart attack are reviewed and when to call the  physician/911 reinforced. Tips for preventing the spread of infection discussed.   PULMONARY REHAB OTHER RESPIRATORY from 06/17/2017 in [place]  Date  05/20/17  Educator  RN  Instruction Review Code  2- meets goals/outcomes      Advanced Directives:  -Group instruction provided by verbal instruction and written material to support subject matter. Instructor reviews Advanced Directive laws and proper instruction for filling out document.   Pulmonary Video:  -  Group video education that reviews the importance of medication and oxygen compliance, exercise, good nutrition, pulmonary hygiene, and pursed lip and diaphragmatic breathing for the pulmonary patient.   PULMONARY REHAB OTHER RESPIRATORY from 06/17/2017 in [place]  Date  05/27/17  Instruction Review Code  2- meets goals/outcomes      Exercise for the Pulmonary Patient:  -Group instruction that is supported by a PowerPoint presentation. Instructor discusses benefits of exercise, core components of exercise, frequency, duration, and intensity of an exercise routine, importance of utilizing pulse oximetry during exercise, safety while exercising, and options of places to exercise outside of rehab.     PULMONARY REHAB OTHER RESPIRATORY from 06/17/2017 in [place]  Date  05/11/17  Educator  ep  Instruction Review Code  2- meets goals/outcomes      Pulmonary Medications:  -Verbally interactive group education provided by instructor with focus on inhaled medications and proper administration.   PULMONARY REHAB OTHER RESPIRATORY from 06/17/2017 in [place]  Date  04/29/17  Educator  pharmacist  Instruction Review Code  2- meets goals/outcomes      Anatomy and Physiology of the Respiratory System and Intimacy:  -Group instruction provided by PowerPoint, verbal discussion, and written material to  support subject matter. Instructor reviews respiratory cycle and anatomical components of the respiratory system and their functions. Instructor also reviews differences in obstructive and restrictive respiratory diseases with examples of each. Intimacy, Sex, and Sexuality differences are reviewed with a discussion on how relationships can change when diagnosed with pulmonary disease. Common sexual concerns are reviewed.   MD DAY -A group question and answer session with a medical doctor that allows participants to ask questions that relate to their pulmonary disease state.   PULMONARY REHAB OTHER RESPIRATORY from 06/17/2017 in [place]  Date  06/08/17  Educator  yacoub  Instruction Review Code  2- meets goals/outcomes      OTHER EDUCATION -Group or individual verbal, written, or video instructions that support the educational goals of the pulmonary rehab program.   Knowledge Questionnaire Score:     Knowledge Questionnaire Score - 03/31/17 0825      Knowledge Questionnaire Score   Pre Score 11/13      Core Components/Risk Factors/Patient Goals at Admission:     Personal Goals and Risk Factors at Admission - 03/19/17 1148      Core Components/Risk Factors/Patient Goals on Admission   Improve shortness of breath with ADL's Yes   Intervention Provide education, individualized exercise plan and daily activity instruction to help decrease symptoms of SOB with activities of daily living.   Expected Outcomes Short Term: Achieves a reduction of symptoms when performing activities of daily living.   Develop more efficient breathing techniques such as purse lipped breathing and diaphragmatic breathing; and practicing self-pacing with activity Yes   Intervention Provide education, demonstration and support about specific breathing techniuqes utilized for more efficient breathing. Include techniques such as pursed lipped breathing, diaphragmatic breathing  and self-pacing activity.   Expected Outcomes Short Term: Participant will be able to demonstrate and use breathing techniques as needed throughout daily activities.   Stress Yes   Intervention Offer individual and/or small group education and counseling on adjustment to heart disease, stress management and health-related lifestyle change. Teach and support self-help strategies.;Refer participants experiencing significant psychosocial distress to appropriate mental health specialists for further evaluation and treatment. When possible, include family members and  significant others in education/counseling sessions.   Expected Outcomes Short Term: Participant demonstrates changes in health-related behavior, relaxation and other stress management skills, ability to obtain effective social support, and compliance with psychotropic medications if prescribed.;Long Term: Emotional wellbeing is indicated by absence of clinically significant psychosocial distress or social isolation.      Core Components/Risk Factors/Patient Goals Review:      Goals and Risk Factor Review    Row Name 04/28/17 1724 05/21/17 1210 06/20/17 2141         Core Components/Risk Factors/Patient Goals Review   Personal Goals Review Improve shortness of breath with ADL's;Develop more efficient breathing techniques such as purse lipped breathing and diaphragmatic breathing and practicing self-pacing with activity.;Stress Improve shortness of breath with ADL's;Develop more efficient breathing techniques such as purse lipped breathing and diaphragmatic breathing and practicing self-pacing with activity.;Stress Improve shortness of breath with ADL's;Develop more efficient breathing techniques such as purse lipped breathing and diaphragmatic breathing and practicing self-pacing with activity.;Stress     Review Patient has struggles significantly in the program thus far. he is unable to exercise at what he feels to be a sufficient workload  related to significant desaturations. recently recieved order from Dr. [place] Bells for oxygen titration to maintain saturations >85%. Patient also has significant depression. he has regular sessions with Jeanella Craze. he has been encouraged to seek medical advice for his depression. he is constantly tearful during his rehab session and requires alot of one on one attention to discuss stressors and exercise intolerance. he had a follow-up with pulmonologist today and hopefully these issues will be addressed. patient has been placed on an antidepressant and is more accepting of his high oxygen requirements. this acceptance has allowed [place] to increase his workloads as tolerated and he acutally stated this past session that he is beginning to tell a difference in how he feels. He is able to better tolerate workload increases and feels he can do more at home. patient is on an Statistician. he was referred to [facility] for transplant and recieved a formed letter in the mail telling him he was declined. He then recieved a phone call from his local pulmonologist stating that he(pulmonologist) would still like to referr him to pulmonologist at [facility] for a second opinion for medical care. Patient reluctantly agreed. he states he can tell a difference in his stamina and strength however his high and low moods definitely affect his performance in rehab.     Expected Outcomes see admission outcomes see admission outcomes see admission outcomes        Core Components/Risk Factors/Patient Goals at Discharge (Final Review):      Goals and Risk Factor Review - 06/20/17 2141      Core Components/Risk Factors/Patient Goals Review   Personal Goals Review Improve shortness of breath with ADL's;Develop more efficient breathing techniques such as purse lipped breathing and diaphragmatic breathing and practicing self-pacing with activity.;Stress   Review patient is on an Statistician. he was referred to [facility] for  transplant and recieved a formed letter in the mail telling him he was declined. He then recieved a phone call from his local pulmonologist stating that he(pulmonologist) would still like to referr him to pulmonologist at [facility] for a second opinion for medical care. Patient reluctantly agreed. he states he can tell a difference in his stamina and strength however his high and low moods definitely affect his performance in rehab.   Expected Outcomes see admission outcomes      ITP  Comments:   Comments: ITP REVIEW Pt is making slow expected progress toward pulmonary rehab goals after completing 24 sessions. He will be extended to 36 sessions to maximize his physical strength. Recommend continued exercise, life style modification, education, and utilization of breathing techniques to increase stamina and strength and decrease shortness of breath with exertion.

## 2017-06-22 ENCOUNTER — Encounter (HOSPITAL_COMMUNITY)
Admission: RE | Admit: 2017-06-22 | Discharge: 2017-06-22 | Disposition: A | Discharge status: Home / Self Care | Payer: Medicare Other | Source: Ambulatory Visit | Attending: Internal Medicine | Admitting: Internal Medicine

## 2017-06-22 DIAGNOSIS — I272 Pulmonary hypertension, unspecified: Secondary | ICD-10-CM

## 2017-06-23 ENCOUNTER — Telehealth: Payer: Self-pay | Admitting: Pulmonary Disease

## 2017-06-24 ENCOUNTER — Encounter (HOSPITAL_COMMUNITY)
Admission: RE | Admit: 2017-06-24 | Discharge: 2017-06-24 | Disposition: A | Discharge status: Home / Self Care | Payer: Medicare Other | Source: Ambulatory Visit | Attending: Internal Medicine | Admitting: Internal Medicine

## 2017-06-24 VITALS — Wt 168.7 lb

## 2017-06-24 DIAGNOSIS — I272 Pulmonary hypertension, unspecified: Secondary | ICD-10-CM

## 2017-06-24 NOTE — Progress Notes (Signed)
Daily Session Note  Patient Details  Name: AD GUTTMAN MRN: [id number] Date of Birth: [birth year] Referring Provider:     Pulmonary Rehab Walk Test from 03/23/2017 in [place]  Referring Provider  Dr. Haroldine Laws      Encounter Date: 06/24/2017  Check In:     Session Check In - 06/24/17 1102      Check-In   Location [facility]   Staff Present Rodney Langton, RN;Portia Rollene Rotunda, RN, BSN;Molly diVincenzo, MS, ACSM RCEP, Exercise Physiologist;Joan Leonia Reeves, RN, BSN   Supervising physician immediately available to respond to emergencies [facility] immediately available   Physician(s) Dr. Wendee Beavers   Medication changes reported     No   Fall or balance concerns reported    No   Tobacco Cessation No Change   Warm-up and Cool-down Performed as group-led instruction   Resistance Training Performed Yes   VAD Patient? No     Pain Assessment   Currently in Pain? No/denies   Multiple Pain Sites No      Capillary Blood Glucose: No results found for this or any previous visit (from the past 24 hour(s)).      Exercise Prescription Changes - 06/24/17 1200      Response to Exercise   Blood Pressure (Admit) 96/50   Blood Pressure (Exercise) 138/66   Blood Pressure (Exit) 92/60   Heart Rate (Admit) 52 bpm   Heart Rate (Exercise) 102 bpm   Heart Rate (Exit) 88 bpm   Oxygen Saturation (Admit) 97 %   Oxygen Saturation (Exercise) 95 %   Oxygen Saturation (Exit) 96 %   Rating of Perceived Exertion (Exercise) 14   Perceived Dyspnea (Exercise) 2   Duration Continue with 45 min of aerobic exercise without signs/symptoms of physical distress.   Intensity THRR unchanged     Progression   Progression Continue to progress workloads to maintain intensity without signs/symptoms of physical distress.     Resistance Training   Training Prescription Yes   Weight orange bands   Reps 10-15   Time 10 Minutes     Oxygen   Oxygen Continuous   Liters 25     Treadmill   MPH 3   Grade 5   Minutes 17     Bike   Level 1.4   Minutes 17      History  Smoking Status  . Former Smoker  . Packs/day: 1.00  . Years: 18.00  . Quit date: 09/27/1985  Smokeless Tobacco  . Never Used    Goals Met:  Exercise tolerated well No report of cardiac concerns or symptoms Strength training completed today  Goals Unmet:  Not Applicable  Comments: Service time is from 1030 to 1230    Dr. Rush Farmer is Medical Director for Pulmonary Rehab at [facility].

## 2017-06-24 NOTE — Telephone Encounter (Signed)
Tammy please advise if encounter can be close, as it is blank. Thanks.

## 2017-06-29 ENCOUNTER — Encounter (HOSPITAL_COMMUNITY): Payer: Self-pay | Admitting: Internal Medicine

## 2017-06-29 ENCOUNTER — Encounter (HOSPITAL_COMMUNITY)
Admission: RE | Admit: 2017-06-29 | Discharge: 2017-06-29 | Disposition: A | Discharge status: Home / Self Care | Payer: Medicare Other | Source: Ambulatory Visit | Attending: Internal Medicine | Admitting: Internal Medicine

## 2017-06-29 VITALS — Wt 168.4 lb

## 2017-06-29 DIAGNOSIS — I272 Pulmonary hypertension, unspecified: Secondary | ICD-10-CM | POA: Diagnosis not present

## 2017-06-29 NOTE — Progress Notes (Signed)
Daily Session Note  Patient Details  Name: [name] MRN: [id number] Date of Birth: 05-07-1946 Referring Provider:     Pulmonary Rehab Walk Test from 03/23/2017 in [place]  Referring Provider  Dr. Haroldine Laws      Encounter Date: 06/29/2017  Check In:     Session Check In - 06/29/17 1030      Check-In   Location [facility]   Staff Present Rosebud Poles, RN, BSN;Molly diVincenzo, MS, ACSM RCEP, Exercise Physiologist;Lisa Ysidro Evert, RN;Chameka Mcmullen Rollene Rotunda, RN, BSN   Supervising physician immediately available to respond to emergencies [facility] immediately available   Physician(s) Dr. Wendee Beavers   Medication changes reported     No   Fall or balance concerns reported    No   Tobacco Cessation No Change   Warm-up and Cool-down Performed as group-led instruction   Resistance Training Performed Yes   VAD Patient? No     Pain Assessment   Currently in Pain? No/denies   Multiple Pain Sites No      Capillary Blood Glucose: No results found for this or any previous visit (from the past 24 hour(s)).      Exercise Prescription Changes - 06/29/17 1213      Response to Exercise   Blood Pressure (Admit) 98/66   Blood Pressure (Exercise) 110/60   Blood Pressure (Exit) 108/62   Heart Rate (Admit) 54 bpm   Heart Rate (Exercise) 108 bpm   Heart Rate (Exit) 60 bpm   Oxygen Saturation (Admit) 97 %   Oxygen Saturation (Exercise) 92 %   Oxygen Saturation (Exit) 94 %   Rating of Perceived Exertion (Exercise) 13   Perceived Dyspnea (Exercise) 3   Duration Continue with 45 min of aerobic exercise without signs/symptoms of physical distress.   Intensity THRR unchanged     Progression   Progression Continue to progress workloads to maintain intensity without signs/symptoms of physical distress.     Resistance Training   Training Prescription Yes   Weight orange bands   Reps 10-15   Time 10 Minutes     Oxygen   Oxygen Continuous   Liters 25     Treadmill   MPH 3   Grade 5   Minutes 34     NuStep   Level 5   Minutes 17   METs 2.2      History  Smoking Status  . Former Smoker  . Packs/day: 1.00  . Years: 18.00  . Quit date: 09/27/1985  Smokeless Tobacco  . Never Used    Goals Met:  Independence with exercise equipment Improved SOB with ADL's Using PLB without cueing & demonstrates good technique Exercise tolerated well No report of cardiac concerns or symptoms Strength training completed today  Goals Unmet:  Not Applicable  Comments: Service time is from 1030 to 1200   Dr. Rush Farmer is Medical Director for Pul[facility].

## 2017-07-01 ENCOUNTER — Encounter (HOSPITAL_COMMUNITY)
Admission: RE | Admit: 2017-07-01 | Discharge: 2017-07-01 | Disposition: A | Discharge status: Home / Self Care | Payer: Medicare Other | Source: Ambulatory Visit | Attending: Internal Medicine | Admitting: Internal Medicine

## 2017-07-01 VITALS — Wt 168.9 lb

## 2017-07-01 DIAGNOSIS — Z79899 Other long term (current) drug therapy: Secondary | ICD-10-CM | POA: Diagnosis not present

## 2017-07-01 DIAGNOSIS — I272 Pulmonary hypertension, unspecified: Secondary | ICD-10-CM | POA: Diagnosis not present

## 2017-07-01 DIAGNOSIS — I2510 Atherosclerotic heart disease of native coronary artery without angina pectoris: Secondary | ICD-10-CM | POA: Diagnosis not present

## 2017-07-01 DIAGNOSIS — Z87891 Personal history of nicotine dependence: Secondary | ICD-10-CM | POA: Insufficient documentation

## 2017-07-01 DIAGNOSIS — F411 Generalized anxiety disorder: Secondary | ICD-10-CM | POA: Insufficient documentation

## 2017-07-01 DIAGNOSIS — Z7982 Long term (current) use of aspirin: Secondary | ICD-10-CM | POA: Diagnosis not present

## 2017-07-01 DIAGNOSIS — E785 Hyperlipidemia, unspecified: Secondary | ICD-10-CM | POA: Insufficient documentation

## 2017-07-01 NOTE — Progress Notes (Signed)
Daily Session Note  Patient Details  Name: [name] MRN: [id number] Date of Birth: Dec 04, 1945 Referring Provider:     Pulmonary Rehab Walk Test from 03/23/2017 in [place]  Referring Provider  Dr. Haroldine Laws      Encounter Date: 07/01/2017  Check In:     Session Check In - 07/01/17 1030      Check-In   Location [facility]   Staff Present Rosebud Poles, RN, BSN;Molly diVincenzo, MS, ACSM RCEP, Exercise Physiologist;Lisa Ysidro Evert, RN;Hermelinda Diegel Rollene Rotunda, RN, BSN   Supervising physician immediately available to respond to emergencies [facility] immediately available   Physician(s) Dr. Wynetta Emery   Medication changes reported     No   Fall or balance concerns reported    No   Tobacco Cessation No Change   Warm-up and Cool-down Performed as group-led instruction   Resistance Training Performed Yes   VAD Patient? No     Pain Assessment   Currently in Pain? No/denies   Multiple Pain Sites No      Capillary Blood Glucose: No results found for this or any previous visit (from the past 24 hour(s)).      Exercise Prescription Changes - 07/01/17 1239      Response to Exercise   Blood Pressure (Admit) 100/50   Blood Pressure (Exit) 104/64   Heart Rate (Admit) 54 bpm   Heart Rate (Exercise) 108 bpm   Heart Rate (Exit) 70 bpm   Oxygen Saturation (Admit) 96 %   Oxygen Saturation (Exercise) 90 %   Oxygen Saturation (Exit) 97 %   Rating of Perceived Exertion (Exercise) 12   Perceived Dyspnea (Exercise) 2   Duration Continue with 45 min of aerobic exercise without signs/symptoms of physical distress.   Intensity THRR unchanged     Progression   Progression Continue to progress workloads to maintain intensity without signs/symptoms of physical distress.     Resistance Training   Training Prescription Yes   Weight orange bands   Reps 10-15   Time 10 Minutes     Oxygen   Oxygen Continuous   Liters 25     Treadmill   MPH  3   Grade 5   Minutes 34      History  Smoking Status  . Former Smoker  . Packs/day: 1.00  . Years: 18.00  . Quit date: 09/27/1985  Smokeless Tobacco  . Never Used    Goals Met:  Independence with exercise equipment Improved SOB with ADL's Using PLB without cueing & demonstrates good technique Exercise tolerated well No report of cardiac concerns or symptoms Strength training completed today  Goals Unmet:  Not Applicable  Comments: Service time is from 1030 to 1230   Dr. Rush Farmer is Medical Director for Pulmonary Rehab at [facility].

## 2017-07-06 ENCOUNTER — Encounter (HOSPITAL_COMMUNITY)
Admission: RE | Admit: 2017-07-06 | Discharge: 2017-07-06 | Disposition: A | Discharge status: Home / Self Care | Payer: Medicare Other | Source: Ambulatory Visit | Attending: Internal Medicine | Admitting: Internal Medicine

## 2017-07-06 VITALS — Wt 169.5 lb

## 2017-07-06 DIAGNOSIS — I272 Pulmonary hypertension, unspecified: Secondary | ICD-10-CM | POA: Diagnosis not present

## 2017-07-06 NOTE — Progress Notes (Signed)
Daily Session Note  Patient Details  Name: [name] MRN: [id number] Date of Birth: November 30, 1945 Referring Provider:     Pulmonary Rehab Walk Test from 03/23/2017 in [place]  Referring Provider  Dr. Haroldine Laws      Encounter Date: 07/06/2017  Check In: Session Check In - 07/06/17 1023      Check-In   Location  [facility]    Staff Present  Su Hilt, MS, ACSM RCEP, Exercise Physiologist;Lisa Ysidro Evert, RN;Portia [place], RN, Maxcine Ham, RN, BSN    Supervising physician immediately available to respond to emergencies  [facility] immediately available    Physician(s)  Dr. Broadus John    Medication changes reported      No    Fall or balance concerns reported     No    Tobacco Cessation  No Change    Warm-up and Cool-down  Performed as group-led instruction    Resistance Training Performed  Yes    VAD Patient?  No      Pain Assessment   Currently in Pain?  No/denies    Multiple Pain Sites  No       Capillary Blood Glucose: No results found for this or any previous visit (from the past 24 hour(s)).  Exercise Prescription Changes - 07/06/17 1200      Response to Exercise   Blood Pressure (Admit)  100/54    Blood Pressure (Exercise)  140/70    Blood Pressure (Exit)  98/56    Heart Rate (Admit)  61 bpm    Heart Rate (Exercise)  107 bpm    Heart Rate (Exit)  67 bpm    Oxygen Saturation (Admit)  94 %    Oxygen Saturation (Exercise)  89 %    Oxygen Saturation (Exit)  93 %    Rating of Perceived Exertion (Exercise)  13    Perceived Dyspnea (Exercise)  3    Duration  Continue with 45 min of aerobic exercise without signs/symptoms of physical distress.    Intensity  THRR unchanged      Progression   Progression  Continue to progress workloads to maintain intensity without signs/symptoms of physical distress.      Resistance Training   Training Prescription  Yes    Weight  orange bands    Reps  10-15    Time   10 Minutes      Oxygen   Oxygen  Continuous    Liters  25      Treadmill   MPH  3    Grade  5    Minutes  34      Bike   Level  1.4    Minutes  17       Social History   Tobacco Use  Smoking Status Former Smoker  . Packs/day: 1.00  . Years: 18.00  . Pack years: 18.00  . Last attempt to quit: 09/27/1985  . Years since quitting: 31.7  Smokeless Tobacco Never Used    Goals Met:  Exercise tolerated well No report of cardiac concerns or symptoms Strength training completed today  Goals Unmet:  Not Applicable  Comments: Service time is from 10:30a to 12:15p    Dr. Rush Farmer is Medical Director for Pulmonary Rehab at [facility].

## 2017-07-07 ENCOUNTER — Other Ambulatory Visit (HOSPITAL_COMMUNITY): Payer: Medicare Other

## 2017-07-07 ENCOUNTER — Encounter (HOSPITAL_COMMUNITY): Payer: Self-pay | Admitting: Internal Medicine

## 2017-07-07 ENCOUNTER — Ambulatory Visit (HOSPITAL_COMMUNITY)
Admission: RE | Admit: 2017-07-07 | Discharge: 2017-07-07 | Disposition: A | Discharge status: Home / Self Care | Payer: Medicare Other | Source: Ambulatory Visit | Attending: Internal Medicine | Admitting: Internal Medicine

## 2017-07-07 VITALS — BP 115/65 | HR 61 | Wt 172.8 lb

## 2017-07-07 DIAGNOSIS — I25118 Atherosclerotic heart disease of native coronary artery with other forms of angina pectoris: Secondary | ICD-10-CM

## 2017-07-07 DIAGNOSIS — Z888 Allergy status to other drugs, medicaments and biological substances status: Secondary | ICD-10-CM | POA: Diagnosis not present

## 2017-07-07 DIAGNOSIS — J449 Chronic obstructive pulmonary disease, unspecified: Secondary | ICD-10-CM | POA: Insufficient documentation

## 2017-07-07 DIAGNOSIS — Z87891 Personal history of nicotine dependence: Secondary | ICD-10-CM | POA: Insufficient documentation

## 2017-07-07 DIAGNOSIS — I2582 Chronic total occlusion of coronary artery: Secondary | ICD-10-CM | POA: Diagnosis not present

## 2017-07-07 DIAGNOSIS — Z79899 Other long term (current) drug therapy: Secondary | ICD-10-CM | POA: Diagnosis not present

## 2017-07-07 DIAGNOSIS — Z791 Long term (current) use of non-steroidal anti-inflammatories (NSAID): Secondary | ICD-10-CM | POA: Diagnosis not present

## 2017-07-07 DIAGNOSIS — Z9889 Other specified postprocedural states: Secondary | ICD-10-CM | POA: Diagnosis not present

## 2017-07-07 DIAGNOSIS — Z85828 Personal history of other malignant neoplasm of skin: Secondary | ICD-10-CM | POA: Diagnosis not present

## 2017-07-07 DIAGNOSIS — J9611 Chronic respiratory failure with hypoxia: Secondary | ICD-10-CM | POA: Insufficient documentation

## 2017-07-07 DIAGNOSIS — E785 Hyperlipidemia, unspecified: Secondary | ICD-10-CM | POA: Insufficient documentation

## 2017-07-07 DIAGNOSIS — Z880 Allergy status to penicillin: Secondary | ICD-10-CM | POA: Insufficient documentation

## 2017-07-07 DIAGNOSIS — I2510 Atherosclerotic heart disease of native coronary artery without angina pectoris: Secondary | ICD-10-CM | POA: Insufficient documentation

## 2017-07-07 DIAGNOSIS — I7300 Raynaud's syndrome without gangrene: Secondary | ICD-10-CM | POA: Insufficient documentation

## 2017-07-07 DIAGNOSIS — Z7982 Long term (current) use of aspirin: Secondary | ICD-10-CM | POA: Insufficient documentation

## 2017-07-07 DIAGNOSIS — I272 Pulmonary hypertension, unspecified: Secondary | ICD-10-CM | POA: Diagnosis not present

## 2017-07-07 NOTE — Progress Notes (Signed)
Patient ID: [name], male   DOB: 1945-09-21, 71 y.o.   MRN: [id number]     [facility] CONSULT NOTE   Date:  07/07/2017   ID:  [name], DOB February 03, 1946, MRN [id number]  PCP:  Aaron Cruel, MD   REFERRING: [name]   History of Present Illness: [name] is a 71 y.o. male with a h/o morbid obesity (s/p extensive weight loss), previous smoker quit in the 98s)m Raynaud's disease and CAD with a known CTO of the RCA (Cath 2005) who is referred by Dr. Irish Howard for further evaluation of pulmonary HTN.   Developed worsening dyspnea after PNA in 9/16. He is an avid Economist but wasstruggling to play due to his symptoms. He saw Dr. Lamonte Howard and PFTs showed mild to moderate obstruction with +BD response and markedly reduced DLCO (29%). He was started on Symbicort and right heart cath was suggested. Autoimmune labs were done, shows ANA positive at 1:40, a-scl-70 negative. RF negative. With markedly reduced DLCO. Underwent R/L cath with stable CAD and moderate to severe PAH. (see below)  Initially felt to have Group I PAH (with PH out of proportion to COPD). VQ negative for PE. Hi res CT with moderate to severe COPD despite relatively normal spirometry on PFTs.   He returns for f/u. Feeling better. Going to Pulmonary Rehab. Able to do more. Walking on TM 65mh at 5% grade but requires 25L/O2. Continues on Opsumit 10 daily but experiences sinus symptoms. Also take bronchodilators but not helping. Denies syncope or presyncope. No significant edema. No palpitations. Se[facility] Dr[facility] [place] [place] not a candidate for lung transplantation. Given only mildly reduced spirometry they questioned whether or not PAH may be primary issue vs parenchymal lung disease. [facility]  Studies:  Had cath 3/18:  Mild nonobstructive CAD on left. CTO RCA with left to right and right to right colatterals (unchanged since 2005) RA 4 RV 78/4 PA 79/23  (43) PCW 4 Fick 5.2/2.5 Mild response to BDs  PFTs 1/18 FEV1  2.61 (75%) FVC    4.15 (87%) DLCO 24%  VQ: 4/18  Diminished perfusion in the RIGHT upper lobe corresponding to emphysematous changes on CT.  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  Echo 2/18: EF 65-70% Grade I DD RV mildly dilated with normal function. No septal flattening (reviewed personally)  CXR 12/02/16: Normal (reviewed personally)   Past Medical History:  Diagnosis Date  . CAD (coronary artery disease)   . GAD (generalized anxiety disorder)   . Hyperlipidemia   . Overweight(278.02)   . Pulmonary hypertension ([place]     Past Surgical History:  Procedure Laterality Date  . ANAL FISSURE REPAIR    . CARDIAC CATHETERIZATION  2005  . SKIN CANCER EXCISION    . TONSILLECTOMY  1955     Current Outpatient Medications  Medication Sig Dispense Refill  . aspirin 81 MG tablet Take 81 mg by mouth every evening.     . Azelastine-Fluticasone (DYMISTA) 137-50 MCG/ACT SUSP Place 1-2 sprays into the nose daily. 1 Bottle 5  . clonazePAM (KLONOPIN) 0.5 MG tablet Take 0.5 mg by mouth 2 (two) times daily as needed for anxiety.   0  . Coenzyme Q10 (VITALINE COQ10) 60 MG TABS Take 60 mg by mouth daily.    .Marland Kitchenezetimibe (ZETIA) 10 MG tablet Take 1 tablet (  10 mg total) by mouth every evening. 90 tablet 3  . Fluticasone-Umeclidin-Vilant (TRELEGY ELLIPTA) 100-62.5-25 MCG/INH AEPB Inhale 1 puff into the lungs daily. 60 each 5  . ipratropium (ATROVENT) 0.03 % nasal spray Place 2 sprays into both nostrils as directed. 30 mL 5  . Lactobacillus (ACIDOPHILUS PO) Take 1 capsule by mouth daily.     . Macitentan (OPSUMIT) 10 MG TABS Take 1 tablet (10 mg total) by mouth daily. 30 tablet 11  . mirtazapine (REMERON) 15 MG tablet Take 15 mg by mouth at bedtime.    . Multiple Vitamins-Minerals (CENTRUM CARDIO PO) Take 1 tablet by mouth daily.     . Omega-3  Fatty Acids (OMEGA 3 PO) Take 3,200 mg by mouth 2 (two) times daily. Nordic Natural ultimate Omega 3     . vitamin B-12 (CYANOCOBALAMIN) 1000 MCG tablet Take 1,000 mcg by mouth daily.    Marland Kitchen ibuprofen (ADVIL,MOTRIN) 200 MG tablet Take 400 mg by mouth 2 (two) times daily as needed for headache or moderate pain.    . rosuvastatin (CRESTOR) 40 MG tablet Take 1 tablet (40 mg total) by mouth daily. 90 tablet 3   No current facility-administered medications for this encounter.     Allergies:   Penicillins and Symbicort [budesonide-formoterol fumarate]    Social History:  The patient  reports that he quit smoking about 31 years ago. He has a 18.00 pack-year smoking history. he has never used smokeless tobacco. He reports that he drinks alcohol. He reports that he does not use drugs. He is married.   Family History:  The patient's family history includes Cancer in his sister; Heart attack in his father; Heart disease in his father; Stroke in his father.    PHYSICAL EXAM: VS:  BP 115/65   Pulse 61   Wt 172 lb 12.8 oz (78.4 kg)   SpO2 98% Comment: 2 LNC  BMI 23.44 kg/m  , BMI Body mass index is 23.44 kg/m. General:  Thin male. No resp difficulty Wearing O2 by [place] HEENT: normal. anicteric Neck: supple. JVP 5-6. Carotids 2+ bilat; no bruits No lymphadenopathy or thryomegaly appreciated. Cor: PMI nondisplaced. RRR soft TR murmur. Prominent P2. Lungs: clear with decreased BS no wheeze Abdomen: soft. NT/ND  No HSM. Good BS. Extremities: no cyanosis, rash, edema + clubbing Neuro: alert & oriented x 3, cranial nerves grossly intact. moves all 4 extremities w/o difficulty. Affect pleasant    Recent Labs: 12/02/2016: BUN 19; Creatinine, Ser 1.00; Potassium 4.2; Sodium 143 04/28/2017: Hemoglobin 15.4; Platelets 107.0   Lipid Panel    Component Value Date/Time   CHOL 101 06/12/2016 0930   CHOL 117 02/06/2015 0853   TRIG 86 06/12/2016 0930   TRIG 120 02/06/2015 0853   HDL 37 (L) 06/12/2016 0930    HDL 35 (L) 02/06/2015 0853   CHOLHDL 2.7 06/12/2016 0930   LDLCALC 47 06/12/2016 0930   LDLCALC 58 02/06/2015 0853      ASSESSMENT AND PLAN:  1. PAH - Suspect Combination of WHO Group I and III - VQ scan (negative except for severe COPD), hi-res CT (severe COPD) and has seen Rheumatology (felt not to have CTD). - Continue Macitnetan.  - Recently seen at [facility] and told he was not a candidate for lung transplantation. Given only mildly reduced spirometry they questioned whether or not PAH may be primary issue vs parenchymal lung disease. This has been the ongoing issue. CT shows relatively severe COPD and he has severe exertional hypoxemia  but PFTs don't support - He has tolerated macitentan well but we have been hesitant to add a second agent due to risk of shunting in setting of COPD. [facility] has been [facility] to discuss. - I have suggested that it may be worth taking him to the cath lab and perform a trial of inhaled NO with and without exercise and watch pressures and sats. If no evidence of worsening hypoxemia/shunting may be worth considering addition of PDE-5 or selexipag. D/w Dr. Lake Bells as well  - Will await [facility] inout 2. CAD   --known CTO of RCA dating back to 2005   --no s/s of ischemia. Continue asa, statin 3. h/o obesity s/p weight loss 4. COPD   --moderate to severe. Follows with Dr. Karen Kays 5. Raynaud's disease 6. Chronic respiratory failure on home O2 - Doing wel from a functional perspective but requiring high levels of O2. Discussed with Dr. Lake Bells who continues to pursue lung transplant options.  Total time spent 45 minutes. Over half that time spent discussing above.    Glori Bickers, MD  10:18 AM

## 2017-07-07 NOTE — Patient Instructions (Signed)
We will contact you in 4 months to schedule your next appointment.

## 2017-07-08 ENCOUNTER — Encounter (HOSPITAL_COMMUNITY)
Admission: RE | Admit: 2017-07-08 | Discharge: 2017-07-08 | Disposition: A | Discharge status: Home / Self Care | Payer: Medicare Other | Source: Ambulatory Visit | Attending: Internal Medicine | Admitting: Internal Medicine

## 2017-07-08 VITALS — Wt 169.8 lb

## 2017-07-08 DIAGNOSIS — I272 Pulmonary hypertension, unspecified: Secondary | ICD-10-CM | POA: Diagnosis not present

## 2017-07-08 NOTE — Progress Notes (Signed)
Daily Session Note  Patient Details  Name: [name] MRN: [id number] Date of Birth: 06/04/1946 Referring Provider:     Pulmonary Rehab Walk Test from 03/23/2017 in [facility]  Referring Provider  Dr. Bensimhon      Encounter Date: 07/08/2017  Check In: Session Check In - 07/08/17 1030      Check-In   Location  [facility]    Staff Present  Molly diVincenzo, MS, ACSM RCEP, Exercise Physiologist;Lisa Hughes, RN; , RN, BSN;Joan Behrens, RN, BSN    Supervising physician immediately available to respond to emergencies  [facility] immediately available    Physician(s)  Dr. Silva    Medication changes reported      No    Fall or balance concerns reported     No    Tobacco Cessation  No Change    Warm-up and Cool-down  Performed as group-led instruction    Resistance Training Performed  Yes    VAD Patient?  No      Pain Assessment   Currently in Pain?  No/denies    Multiple Pain Sites  No       Capillary Blood Glucose: No results found for this or any previous visit (from the past 24 hour(s)).  Exercise Prescription Changes - 07/08/17 1316      Response to Exercise   Blood Pressure (Admit)  106/66    Blood Pressure (Exercise)  132/80    Blood Pressure (Exit)  118/72    Heart Rate (Admit)  60 bpm    Heart Rate (Exercise)  108 bpm    Heart Rate (Exit)  70 bpm    Oxygen Saturation (Admit)  91 %    Oxygen Saturation (Exercise)  95 %    Oxygen Saturation (Exit)  94 %    Rating of Perceived Exertion (Exercise)  14    Perceived Dyspnea (Exercise)  2    Duration  Continue with 45 min of aerobic exercise without signs/symptoms of physical distress.    Intensity  THRR unchanged      Progression   Progression  Continue to progress workloads to maintain intensity without signs/symptoms of physical distress.      Resistance Training   Training Prescription  Yes    Weight  orange bands    Reps  10-15    Time   10 Minutes      Oxygen   Oxygen  Continuous    Liters  25      Treadmill   MPH  3    Grade  5    Minutes  34       Social History   Tobacco Use  Smoking Status Former Smoker  . Packs/day: 1.00  . Years: 18.00  . Pack years: 18.00  . Last attempt to quit: 09/27/1985  . Years since quitting: 31.8  Smokeless Tobacco Never Used    Goals Met:  Independence with exercise equipment Improved SOB with ADL's Using PLB without cueing & demonstrates good technique Exercise tolerated well No report of cardiac concerns or symptoms Strength training completed today  Goals Unmet:  Not Applicable  Comments: Service time is from 1030 to 1230   Dr. Wesam G. Yacoub is Medical Director for Pulmonary Rehab at [facility]. 

## 2017-07-09 ENCOUNTER — Encounter: Payer: Self-pay | Admitting: Adult Health

## 2017-07-09 ENCOUNTER — Ambulatory Visit: Payer: Medicare Other | Admitting: Adult Health

## 2017-07-09 DIAGNOSIS — I272 Pulmonary hypertension, unspecified: Secondary | ICD-10-CM

## 2017-07-09 DIAGNOSIS — J9611 Chronic respiratory failure with hypoxia: Secondary | ICD-10-CM

## 2017-07-09 DIAGNOSIS — J432 Centrilobular emphysema: Secondary | ICD-10-CM

## 2017-07-09 NOTE — Patient Instructions (Addendum)
Continue on TRELEGY 1 puff daily . -brush/rinse and gargle .  Continue on Oxygen .  Follow up with Dr. Lake Bells in 2-3 months and As needed

## 2017-07-09 NOTE — Assessment & Plan Note (Signed)
Keep O2 sats >90.   Plan Patient Instructions  Continue on TRELEGY 1 puff daily . -brush/rinse and gargle .  Continue on Oxygen .  Follow up with Dr. Lake Bells in 2-3 months and As needed

## 2017-07-09 NOTE — Assessment & Plan Note (Signed)
Cont on O2 , keep sats >88-90%.   Plan  Patient Instructions  Continue on TRELEGY 1 puff daily . -brush/rinse and gargle .  Continue on Oxygen .  Follow up with Dr. Lake Bells in 2-3 months and As needed

## 2017-07-09 NOTE — Assessment & Plan Note (Signed)
Continue on current regimen  Follow up with [facility] team for second opinion next month as planned   Plan  Patient Instructions  Continue on TRELEGY 1 puff daily . -brush/rinse and gargle .  Continue on Oxygen .  Follow up with Dr. Lake Bells in 2-3 months and As needed

## 2017-07-09 NOTE — Progress Notes (Signed)
_0  ID: [name], male    DOB: 07-Mar-1946, 71 y.o.   MRN: [id number]  Chief Complaint  Patient presents with  . Follow-up    COPD     Referring provider: Lawerance Cruel, MD  HPI: 71 year old male former smoker followed for emphysema and pulmonary hypertension  TEST /Events  Chest imaging: April 2018 CT chest images independently reviewed showing moderate to severe centrilobular emphysema, 3 mm right upper lobe solid pulmonary nodule. Emphysema is upper lobe predominant April 2018 VQ scan showed some mismatch in the upper lobes where he has emphysema but otherwise no worrisome findings for chronic, embolic pulmonary hypertension  Echo: February 2018 LVEF 60-65% with grade 1 diastolic dysfunction RV mildly dilated with normal function.  PFT: January 2018: Ratio 65%, FEV1 2.96 L 85% predicted, 13 % change post bronchodilator, FVC 4.5 L 96% predicted, total lung capacity 6.84 L 92% predicted, DLCO 8.5 324% predicted  Heart Cath: 10/2016 [facility]: RA 4, RV 78/4, PA 79/23 (43), PCW 4, Fick 5.2/2.5 10/2016 LHC:   Mild to moderate left sided CAD.  Mid RCA lesion, 100 %stenosed. 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  Ost LM to LM lesion, 20 %stenosed.  The left ventricular systolic function is normal.  LV end diastolic pressure is normal.   07/02/2017 >[facility] Dr. Dorothyann Peng >refer to [facility] clinic    Declined for Lung Transplant    07/09/2017 Follow  Up : Emphysema , O2 RF and Pulmonary HTN  Pt presents for a 6 week follow up . Pt has recently been seen at [facility] - he has been declined for lung transplant . He was seen by Dr. Dorothyann Peng last week with recommendations to keep O2 sats >90%, manage OSA and D CHF. He has been referred to [facility] team. Has an appointment with them in early Dec.  He remains [facility].   Care Everywhere notes reviewed   He has mild COPD w/ Emphysema . Remains on TRELEGY . Unsure if this helps or  not . No flare of cough or wheezing .   On oxygen 2l/m rest , light activity on 3l/m , prolonged activity 4-5l/m .   heavy exercise at rehab requires very high flow -25l/m . He checks his O2 sats, is aware to keep >90%.     Allergies  Allergen Reactions  . Penicillins Hives    Has patient had a PCN reaction causing immediate rash, facial/tongue/throat swelling, SOB or lightheadedness with hypotension: No Has patient had a PCN reaction causing severe rash involving mucus membranes or skin necrosis: Yes Has patient had a PCN reaction that required hospitalization Unknown Has patient had a PCN reaction occurring within the last 10 years: No If all of the above answers are "NO", then may proceed with Cephalosporin use.   . Symbicort [Budesonide-Formoterol Fumarate]     Severe cough    Immunization History  Administered Date(s) Administered  . Influenza, High Dose Seasonal PF 05/29/2015, 05/05/2017  . Influenza,inj,quad, With Preservative 04/23/2016  . Influenza-Unspecified 05/29/2015  . Pneumococcal Conjugate-13 02/05/2014  . Pneumococcal Polysaccharide-23 01/29/2012  . Tdap 01/29/2012  . Zoster 08/31/2012    Past Medical History:  Diagnosis Date  . CAD (coronary artery disease)   . GAD (generalized anxiety disorder)   . Hyperlipidemia   . Overweight(278.02)   . Pulmonary hypertension (HCC)     Tobacco History: Social History   Tobacco Use  Smoking Status Former Smoker  . Packs/day: 1.00  .  Years: 18.00  . Pack years: 18.00  . Last attempt to quit: 09/27/1985  . Years since quitting: 31.8  Smokeless Tobacco Never Used   Counseling given: Not Answered   Outpatient Encounter Medications as of 71/05/2017  Medication Sig  . aspirin 81 MG tablet Take 81 mg by mouth every evening.   . Azelastine-Fluticasone (DYMISTA) 137-50 MCG/ACT SUSP Place 1-2 sprays into the nose daily.  . clonazePAM (KLONOPIN) 0.5 MG tablet Take 0.5 mg 3 (three) times daily as needed by mouth for  anxiety.   . Coenzyme Q10 (VITALINE COQ10) 100 MG WAFR Take 60 mg by mouth daily.  . DULoxetine (CYMBALTA) 30 MG capsule TK 1 TO 2 CS PO QD  . ezetimibe (ZETIA) 10 MG tablet Take 1 tablet (10 mg total) by mouth every evening.  . Fluticasone-Umeclidin-Vilant (TRELEGY ELLIPTA) 100-62.5-25 MCG/INH AEPB Inhale 1 puff into the lungs daily.  . Lactobacillus (ACIDOPHILUS PO) Take 1 capsule by mouth daily.   . Macitentan (OPSUMIT) 10 MG TABS Take 1 tablet (10 mg total) by mouth daily.  . mirtazapine (REMERON) 15 MG tablet Take 15 mg by mouth at bedtime.  . Multiple Vitamins-Minerals (CENTRUM CARDIO PO) Take 1 tablet by mouth daily.   . Omega-3 Fatty Acids (OMEGA 3 PO) Take 3,200 mg by mouth 2 (two) times daily. Nordic Natural ultimate Omega 3   . rosuvastatin (CRESTOR) 40 MG tablet Take 40 mg daily by mouth.  . sertraline (ZOLOFT) 25 MG tablet   . rosuvastatin (CRESTOR) 40 MG tablet Take 1 tablet (40 mg total) by mouth daily.  . [DISCONTINUED] ibuprofen (ADVIL,MOTRIN) 200 MG tablet Take 400 mg by mouth 2 (two) times daily as needed for headache or moderate pain.  . [DISCONTINUED] ipratropium (ATROVENT) 0.03 % nasal spray Place 2 sprays into both nostrils as directed.  . [DISCONTINUED] vitamin B-12 (CYANOCOBALAMIN) 1000 MCG tablet Take 1,000 mcg by mouth daily.   No facility-administered encounter medications on file as of 07/09/2017.      Review of Systems  Constitutional:   No  weight loss, night sweats,  Fevers, chills,  +fatigue, or  lassitude.  HEENT:   No headaches,  Difficulty swallowing,  Tooth/dental problems, or  Sore throat,                No sneezing, itching, ear ache, nasal congestion, post nasal drip,   CV:  No chest pain,  Orthopnea, PND, swelling in lower extremities, anasarca, dizziness, palpitations, syncope.   GI  No heartburn, indigestion, abdominal pain, nausea, vomiting, diarrhea, change in bowel habits, loss of appetite, bloody stools.   Resp:  .  No chest wall  deformity  Skin: no rash or lesions.  GU: no dysuria, change in color of urine, no urgency or frequency.  No flank pain, no hematuria   MS:  No joint pain or swelling.  No decreased range of motion.  No back pain.    Physical Exam  BP 110/60 (BP Location: Left Arm, Cuff Size: Normal)   Pulse (!) 50   Ht 6' (1.829 m)   Wt 169 lb 6.4 oz (76.8 kg)   SpO2 95%   BMI 22.97 kg/m   GEN: A/Ox3; pleasant , NAD, thin chronically ill appearing    HEENT:  [place]/AT,  EACs-clear, TMs-wnl, NOSE-clear, THROAT-clear, no lesions, no postnasal drip or exudate noted.   NECK:  Supple w/ fair ROM; no JVD; normal carotid impulses w/o bruits; no thyromegaly or nodules palpated; no lymphadenopathy.    RESP  diminshed BS  in bases  . no accessory muscle use, no dullness to percussion  CARD:  RRR, no m/r/g, no peripheral edema, pulses intact, no cyanosis or clubbing.  GI:   Soft & nt; nml bowel sounds; no organomegaly or masses detected.   Musco: Warm bil, no deformities or joint swelling noted.   Neuro: alert, no focal deficits noted.    Skin: Warm, no lesions or rashes    Lab Results:  BNP   ProBNP No results found for: PROBNP  Imaging: No results found.   Assessment & Plan:   Pulmonary hypertension ([place]) Continue on current regimen  Follow up with [facility] team for second opinion next month as planned   Plan  Patient Instructions  Continue on TRELEGY 1 puff daily . -brush/rinse and gargle .  Continue on Oxygen .  Follow up with Dr. Lake Bells in 2-3 months and As needed       Centrilobular emphysema ([place]) Keep O2 sats >90.   Plan Patient Instructions  Continue on TRELEGY 1 puff daily . -brush/rinse and gargle .  Continue on Oxygen .  Follow up with Dr. [place] Bells in 2-3 months and As needed       Chronic respiratory failure with hypoxia ([place]) Cont on O2 , keep sats >88-90%.   Plan  Patient Instructions  Continue on TRELEGY 1 puff daily . -brush/rinse and gargle .    Continue on Oxygen .  Follow up with Dr. Lake Bells in 2-3 months and As needed          Rexene Edison, NP 07/09/2017

## 2017-07-13 ENCOUNTER — Encounter (HOSPITAL_COMMUNITY)
Admission: RE | Admit: 2017-07-13 | Discharge: 2017-07-13 | Disposition: A | Discharge status: Home / Self Care | Payer: Medicare Other | Source: Ambulatory Visit | Attending: Internal Medicine | Admitting: Internal Medicine

## 2017-07-13 VITALS — Wt 170.0 lb

## 2017-07-13 DIAGNOSIS — I272 Pulmonary hypertension, unspecified: Secondary | ICD-10-CM

## 2017-07-13 NOTE — Progress Notes (Signed)
Daily Session Note  Patient Details  Name: [name] MRN: [id number] Date of Birth: 02-05-1946 Referring Provider:     Pulmonary Rehab Walk Test from 03/23/2017 in [place]  Referring Provider  Dr. Haroldine Laws      Encounter Date: 07/13/2017  Check In: Session Check In - 07/13/17 1240      Check-In   Location  [facility]    Staff Present  Rodney Langton, RN;Portia Rollene Rotunda, RN, BSN;Jehu Mccauslin, MS, ACSM RCEP, Exercise Physiologist;Joan Leonia Reeves, RN, BSN    Supervising physician immediately available to respond to emergencies  [facility] immediately available    Physician(s)  Dr. Maylene Roes    Medication changes reported      No    Fall or balance concerns reported     No    Tobacco Cessation  No Change    Warm-up and Cool-down  Performed as group-led instruction    Resistance Training Performed  Yes    VAD Patient?  No      Pain Assessment   Currently in Pain?  No/denies    Multiple Pain Sites  No       Capillary Blood Glucose: No results found for this or any previous visit (from the past 24 hour(s)).  Exercise Prescription Changes - 07/13/17 1200      Response to Exercise   Blood Pressure (Admit)  100/56    Blood Pressure (Exercise)  114/70    Blood Pressure (Exit)  96/58    Heart Rate (Admit)  66 bpm    Heart Rate (Exercise)  102 bpm    Heart Rate (Exit)  79 bpm    Oxygen Saturation (Admit)  96 %    Oxygen Saturation (Exercise)  90 %    Oxygen Saturation (Exit)  91 %    Rating of Perceived Exertion (Exercise)  12    Perceived Dyspnea (Exercise)  2    Duration  Continue with 45 min of aerobic exercise without signs/symptoms of physical distress.    Intensity  THRR unchanged      Progression   Progression  Continue to progress workloads to maintain intensity without signs/symptoms of physical distress.      Resistance Training   Training Prescription  Yes    Weight  orange bands    Reps  10-15    Time   10 Minutes      Oxygen   Oxygen  Continuous    Liters  25      Treadmill   MPH  3    Grade  5    Minutes  34      Bike   Level  1.5    Minutes  17       Social History   Tobacco Use  Smoking Status Former Smoker  . Packs/day: 1.00  . Years: 18.00  . Pack years: 18.00  . Last attempt to quit: 09/27/1985  . Years since quitting: 31.8  Smokeless Tobacco Never Used    Goals Met:  Exercise tolerated well No report of cardiac concerns or symptoms Strength training completed today  Goals Unmet:  Not Applicable  Comments: Service time is from 10:30a to 12:15p    Dr. Rush Farmer is Medical Director for Pulmonary Rehab at [facility].

## 2017-07-15 ENCOUNTER — Encounter (HOSPITAL_COMMUNITY)
Admission: RE | Admit: 2017-07-15 | Discharge: 2017-07-15 | Disposition: A | Discharge status: Home / Self Care | Payer: Medicare Other | Source: Ambulatory Visit | Attending: Internal Medicine | Admitting: Internal Medicine

## 2017-07-15 VITALS — Wt 170.6 lb

## 2017-07-15 DIAGNOSIS — I272 Pulmonary hypertension, unspecified: Secondary | ICD-10-CM

## 2017-07-15 NOTE — Progress Notes (Signed)
Pulmonary Individual Treatment Plan  Patient Details  Name: [name] MRN: [id number] Date of Birth: [birth year] Referring Provider:     Pulmonary Rehab Walk Test from 03/23/2017 in [place]  Referring Provider  Dr. Haroldine Laws      Initial Encounter Date:    Pulmonary Rehab Walk Test from 03/23/2017 in [place]  Date  03/25/17  Referring Provider  Dr. Haroldine Laws      Visit Diagnosis: Pulmonary hypertension ([place])  Patient's Home Medications on Admission:   Current Outpatient Medications:  .  aspirin 81 MG tablet, Take 81 mg by mouth every evening. , Disp: , Rfl:  .  Azelastine-Fluticasone (DYMISTA) 137-50 MCG/ACT SUSP, Place 1-2 sprays into the nose daily., Disp: 1 Bottle, Rfl: 5 .  clonazePAM (KLONOPIN) 0.5 MG tablet, Take 0.5 mg 3 (three) times daily as needed by mouth for anxiety. , Disp: , Rfl: 0 .  Coenzyme Q10 (VITALINE COQ10) 100 MG WAFR, Take 60 mg by mouth daily., Disp: , Rfl:  .  DULoxetine (CYMBALTA) 30 MG capsule, TK 1 TO 2 CS PO QD, Disp: , Rfl:  .  ezetimibe (ZETIA) 10 MG tablet, Take 1 tablet (10 mg total) by mouth every evening., Disp: 90 tablet, Rfl: 3 .  Fluticasone-Umeclidin-Vilant (TRELEGY ELLIPTA) 100-62.5-25 MCG/INH AEPB, Inhale 1 puff into the lungs daily., Disp: 60 each, Rfl: 5 .  Lactobacillus (ACIDOPHILUS PO), Take 1 capsule by mouth daily. , Disp: , Rfl:  .  Macitentan (OPSUMIT) 10 MG TABS, Take 1 tablet (10 mg total) by mouth daily., Disp: 30 tablet, Rfl: 11 .  mirtazapine (REMERON) 15 MG tablet, Take 15 mg by mouth at bedtime., Disp: , Rfl:  .  Multiple Vitamins-Minerals (CENTRUM CARDIO PO), Take 1 tablet by mouth daily. , Disp: , Rfl:  .  Omega-3 Fatty Acids (OMEGA 3 PO), Take 3,200 mg by mouth 2 (two) times daily. Nordic Natural ultimate Omega 3 , Disp: , Rfl:  .  rosuvastatin (CRESTOR) 40 MG tablet, Take 1 tablet (40 mg total) by mouth daily., Disp: 90 tablet, Rfl: 3 .  rosuvastatin  (CRESTOR) 40 MG tablet, Take 40 mg daily by mouth., Disp: , Rfl:  .  sertraline (ZOLOFT) 25 MG tablet, , Disp: , Rfl:   Past Medical History: Past Medical History:  Diagnosis Date  . CAD (coronary artery disease)   . GAD (generalized anxiety disorder)   . Hyperlipidemia   . Overweight(278.02)   . Pulmonary hypertension (HCC)     Tobacco Use: Social History   Tobacco Use  Smoking Status Former Smoker  . Packs/day: 1.00  . Years: 18.00  . Pack years: 18.00  . Last attempt to quit: 09/27/1985  . Years since quitting: 31.8  Smokeless Tobacco Never Used    Labs: Recent Chemical engineer    Labs for ITP Cardiac and Pulmonary Rehab Latest Ref Rng & Units 11/25/2015 06/12/2016 11/24/2016 11/24/2016 12/02/2016   Cholestrol <200 mg/dL 118(L) 101 - - -   LDLCALC <100 mg/dL 50 47 - - -   HDL >40 mg/dL 37(L) 37(L) - - -   Trlycerides <150 mg/dL 155(H) 86 - - -   PHART 7.350 - 7.450 - - 7.416 - -   PCO2ART 32.0 - 48.0 mmHg - - 32.2 - -   HCO3 20.0 - 28.0 mmol/L - - 20.7 21.9 -   TCO2 0 - 100 mmol/L - - _0 ACIDBASEDEF 0.0 - 2.0 mmol/L - -  3.0(H) 2.0 -   O2SAT % - - 90.0 67.0 -      Capillary Blood Glucose: No results found for: GLUCAP   Pulmonary Assessment Scores: Pulmonary Assessment Scores    Row Name 03/25/17 0700 03/31/17 0825       ADL UCSD   ADL Phase  Entry  Entry    SOB Score total  -  35      CAT Score   CAT Score  -  10 Entry      mMRC Score   mMRC Score  2  -       Pulmonary Function Assessment:   Exercise Target Goals:    Exercise Program Goal: Individual exercise prescription set with THRR, safety & activity barriers. Participant demonstrates ability to understand and report RPE using BORG scale, to self-measure pulse accurately, and to acknowledge the importance of the exercise prescription.  Exercise Prescription Goal: Starting with aerobic activity 30 plus minutes a day, 3 days per week for initial exercise prescription. Provide home  exercise prescription and guidelines that participant acknowledges understanding prior to discharge.  Activity Barriers & Risk Stratification:   6 Minute Walk: 6 Minute Walk    Row Name 03/23/17 1712         6 Minute Walk   Phase  Initial     Distance  853 feet     Walk Time  5.45 minutes     # of Rest Breaks  1     MPH  1.78     METS  1.43     RPE  11     Perceived Dyspnea   1     VO2 Peak  5.01     Symptoms  No     Resting HR  64 bpm     Resting BP  110/70     Max Ex. HR  73 bpm     Max Ex. BP  106/70     2 Minute Post BP  98/64       Interval HR   Baseline HR (retired)  92     1 Minute HR  71     2 Minute HR  68     3 Minute HR  54     4 Minute HR  60     5 Minute HR  73     6 Minute HR  71     2 Minute Post HR  55     Interval Heart Rate?  Yes       Interval Oxygen   Interval Oxygen?  Yes     Baseline Oxygen Saturation %  92 %     Resting Liters of Oxygen  2 L     1 Minute Oxygen Saturation %  85 % increased to 87      1 Minute Liters of Oxygen  2 L     2 Minute Oxygen Saturation %  86 % short rest break 15 seconds, incr. O2-->91%     2 Minute Liters of Oxygen  4 L     3 Minute Oxygen Saturation %  92 %     3 Minute Liters of Oxygen  6 L     4 Minute Oxygen Saturation %  91 %     4 Minute Liters of Oxygen  6 L     5 Minute Oxygen Saturation %  90 %     5 Minute Liters of Oxygen  6 L  6 Minute Oxygen Saturation %  90 %     6 Minute Liters of Oxygen  6 L     2 Minute Post Oxygen Saturation %  96 %     2 Minute Post Liters of Oxygen  3 L        Oxygen Initial Assessment: Oxygen Initial Assessment - 03/25/17 0700      Initial 6 min Walk   Oxygen Used  Continuous;E-Tanks    Liters per minute  6    Resting Oxygen Saturation   92 % 2liters    Exercise Oxygen Saturation  during 6 min walk  85 % 85-87% 2 liters 90% on 6 liters      Program Oxygen Prescription   Program Oxygen Prescription  Continuous;E-Tanks    Liters per minute  6       Oxygen  Re-Evaluation: Oxygen Re-Evaluation    Row Name 04/28/17 1721 05/21/17 1209 06/20/17 2139 07/12/17 1559       Program Oxygen Prescription   Program Oxygen Prescription  Continuous;E-Tanks  Continuous;E-Tanks  Continuous;E-Tanks  Continuous;E-Tanks    Liters per minute  _0 Comments  order recieved to maintain saturations>85%  order recieved to maintain saturations>85%  order recieved to maintain saturations>85%. utilizing a non-rebreather to eliminate nasal drying from pendant  order recieved to maintain saturations>85%. utilizing a non-rebreather to eliminate nasal drying from pendant      Home Oxygen   Home Oxygen Device  E-Tanks;Home Concentrator  E-Tanks;Home Concentrator  E-Tanks;Home Concentrator  E-Tanks;Home Concentrator    Sleep Oxygen Prescription  Continuous  Continuous  Continuous  Continuous    Liters per minute  _1 Home Exercise Oxygen Prescription  Continuous  Continuous  Continuous  Continuous    Liters per minute  - home exercise not complete until it is determined that patient has oxygen source sufficient to maintain high literflow  -  8 8 liters per MD at this time  8 per MD order    Home at Rest Exercise Oxygen Prescription  Continuous  Continuous  Continuous  Continuous    Liters per minute  2.5  2  -  2    Compliance with Home Oxygen Use  Yes  Yes  Yes  Yes      Goals/Expected Outcomes   Short Term Goals  To learn and exhibit compliance with exercise, home and travel O2 prescription;To learn and understand importance of monitoring SPO2 with pulse oximeter and demonstrate accurate use of the pulse oximeter.;To learn and understand importance of maintaining oxygen saturations>88%;To learn and demonstrate proper pursed lip breathing techniques or other breathing techniques.;To learn and demonstrate proper use of respiratory medications  To learn and exhibit compliance with exercise, home and travel O2 prescription;To learn and understand importance of  monitoring SPO2 with pulse oximeter and demonstrate accurate use of the pulse oximeter.;To learn and understand importance of maintaining oxygen saturations>88%;To learn and demonstrate proper pursed lip breathing techniques or other breathing techniques.;To learn and demonstrate proper use of respiratory medications  To learn and exhibit compliance with exercise, home and travel O2 prescription;To learn and understand importance of monitoring SPO2 with pulse oximeter and demonstrate accurate use of the pulse oximeter.;To learn and understand importance of maintaining oxygen saturations>88%;To learn and demonstrate proper pursed lip breathing techniques or other breathing techniques.;To learn and demonstrate proper use of respiratory medications  To learn and exhibit compliance with exercise,  home and travel O2 prescription;To learn and understand importance of monitoring SPO2 with pulse oximeter and demonstrate accurate use of the pulse oximeter.;To learn and understand importance of maintaining oxygen saturations>88%;To learn and demonstrate proper pursed lip breathing techniques or other breathing techniques.;To learn and demonstrate proper use of respiratory medications    Long  Term Goals  Exhibits compliance with exercise, home and travel O2 prescription;Verbalizes importance of monitoring SPO2 with pulse oximeter and return demonstration;Maintenance of O2 saturations>88%;Exhibits proper breathing techniques, such as pursed lip breathing or other method taught during program session;Compliance with respiratory medication  Exhibits compliance with exercise, home and travel O2 prescription;Verbalizes importance of monitoring SPO2 with pulse oximeter and return demonstration;Maintenance of O2 saturations>88%;Exhibits proper breathing techniques, such as pursed lip breathing or other method taught during program session;Compliance with respiratory medication  Exhibits compliance with exercise, home and  travel O2 prescription;Verbalizes importance of monitoring SPO2 with pulse oximeter and return demonstration;Maintenance of O2 saturations>88%;Exhibits proper breathing techniques, such as pursed lip breathing or other method taught during program session;Compliance with respiratory medication  Exhibits compliance with exercise, home and travel O2 prescription;Verbalizes importance of monitoring SPO2 with pulse oximeter and return demonstration;Maintenance of O2 saturations>88%;Exhibits proper breathing techniques, such as pursed lip breathing or other method taught during program session;Compliance with respiratory medication    Comments  -  patient has appointment with pulmonologist Sept 26 to discuss increased need for oxygen  patient met with MD to discuss oxygen needs. Patient was referred to [facility] transplant clinic and was declined  patient has had second opinion at [facility] with pulmonologist and has been referred to a pulmonary vascular doctor for second opinion and treatment options for pulmonary hypertension    Goals/Expected Outcomes  patient will verbalize home oxygen compliance  patient will verbalize home oxygen compliance  patient will verbalize home oxygen compliance  patient will verbalize home oxygen compliance       Oxygen Discharge (Final Oxygen Re-Evaluation): Oxygen Re-Evaluation - 07/12/17 1559      Program Oxygen Prescription   Program Oxygen Prescription  Continuous;E-Tanks    Liters per minute  25    Comments  order recieved to maintain saturations>85%. utilizing a non-rebreather to eliminate nasal drying from pendant      Home Oxygen   Home Oxygen Device  E-Tanks;Home Concentrator    Sleep Oxygen Prescription  Continuous    Liters per minute  2    Home Exercise Oxygen Prescription  Continuous    Liters per minute  8 per MD order    Home at Rest Exercise Oxygen Prescription  Continuous    Liters per minute  2    Compliance with Home Oxygen Use  Yes      Goals/Expected  Outcomes   Short Term Goals  To learn and exhibit compliance with exercise, home and travel O2 prescription;To learn and understand importance of monitoring SPO2 with pulse oximeter and demonstrate accurate use of the pulse oximeter.;To learn and understand importance of maintaining oxygen saturations>88%;To learn and demonstrate proper pursed lip breathing techniques or other breathing techniques.;To learn and demonstrate proper use of respiratory medications    Long  Term Goals  Exhibits compliance with exercise, home and travel O2 prescription;Verbalizes importance of monitoring SPO2 with pulse oximeter and return demonstration;Maintenance of O2 saturations>88%;Exhibits proper breathing techniques, such as pursed lip breathing or other method taught during program session;Compliance with respiratory medication    Comments  patient has had second opinion at [facility] with pulmonologist and has been referred to  a pulmonary vascular doctor for second opinion and treatment options for pulmonary hypertension    Goals/Expected Outcomes  patient will verbalize home oxygen compliance       Initial Exercise Prescription: Initial Exercise Prescription - 03/25/17 0700      Date of Initial Exercise RX and Referring Provider   Date  03/25/17    Referring Provider  Dr. Haroldine Laws      Oxygen   Oxygen  Continuous    Liters  6      Bike   Level  0.5    Minutes  17      NuStep   Level  2    Minutes  17    METs  1.5      Track   Laps  7    Minutes  17      Prescription Details   Frequency (times per week)  2    Duration  Progress to 45 minutes of aerobic exercise without signs/symptoms of physical distress      Intensity   THRR 40-80% of Max Heartrate  60-119    Ratings of Perceived Exertion  11-13    Perceived Dyspnea  0-4      Progression   Progression  Continue progressive overload as per policy without signs/symptoms or physical distress.      Resistance Training   Training Prescription   Yes    Weight  orange bands    Reps  10-15       Perform Capillary Blood Glucose checks as needed.  Exercise Prescription Changes: Exercise Prescription Changes    Row Name 03/30/17 1200 04/01/17 1359 04/06/17 1200 04/08/17 1200 04/13/17 1200     Response to Exercise   Blood Pressure (Admit)  118/70  100/64  92/52  112/66  100/58   Blood Pressure (Exercise)  122/70  -  132/62  120/58  144/60   Blood Pressure (Exit)  96/62  114/62  98/64  105/69  104/60   Heart Rate (Admit)  59 bpm  55 bpm  66 bpm  60 bpm  59 bpm   Heart Rate (Exercise)  86 bpm  88 bpm  92 bpm  85 bpm  87 bpm   Heart Rate (Exit)  62 bpm  53 bpm  59 bpm  61 bpm  60 bpm   Oxygen Saturation (Admit)  98 %  98 %  96 %  92 %  96 %   Oxygen Saturation (Exercise)  84 %  84 %  87 %  86 %  85 % with forehead probe   Oxygen Saturation (Exit)  94 %  94 %  93 %  93 %  99 %   Rating of Perceived Exertion (Exercise)  _0 Perceived Dyspnea (Exercise)  _1 0   Duration  Continue with 45 min of aerobic exercise without signs/symptoms of physical distress.  Continue with 45 min of aerobic exercise without signs/symptoms of physical distress.  Continue with 45 min of aerobic exercise without signs/symptoms of physical distress.  Continue with 45 min of aerobic exercise without signs/symptoms of physical distress.  Continue with 45 min of aerobic exercise without signs/symptoms of physical distress.   Intensity  THRR unchanged  THRR unchanged  THRR unchanged  THRR unchanged  THRR unchanged     Progression   Progression  Continue to progress workloads to maintain intensity without signs/symptoms of physical distress.  Continue to progress workloads to maintain intensity without signs/symptoms of physical distress.  Continue to progress workloads to maintain intensity without signs/symptoms of physical distress.  Continue to progress workloads to maintain intensity without signs/symptoms of physical distress.  Continue  to progress workloads to maintain intensity without signs/symptoms of physical distress.     Resistance Training   Training Prescription  Yes  Yes  Yes  Yes  Yes   Weight  orange bands  orange bands  orange bands  orange bands  orange bands   Reps  10-15  10-15  10-15  10-15  10-15   Time  10 Minutes  10 Minutes  10 Minutes  10 Minutes  10 Minutes     Oxygen   Oxygen  Continuous  Continuous  Continuous  Continuous  Continuous   Liters  _0 8-10  8-10     Treadmill   MPH  1.6  2  2.3  -  2.2   Grade  0  0  2  -  0   Minutes  _1 -  17     Bike   Level  0.5  -  0.6  0.6  0.8   Minutes  17  -  _2 NuStep   Level  2  -  _3 Minutes  17  -  _4 METs  1.7  -  1.8  1.9  1.7   Row Name 04/15/17 1230 04/20/17 1200 04/22/17 1245 04/27/17 1231 04/29/17 1248     Response to Exercise   Blood Pressure (Admit)  100/66  100/60  100/60  98/62  86/62 reck 110/58   Blood Pressure (Exercise)  116/62  106/60  104/81  144/68  166/72   Blood Pressure (Exit)  98/50  104/70  112/60  100/66  108/64   Heart Rate (Admit)  60 bpm  61 bpm  57 bpm  55 bpm  64 bpm   Heart Rate (Exercise)  82 bpm  89 bpm  89 bpm  111 bpm  101 bpm   Heart Rate (Exit)  58 bpm  63 bpm  63 bpm  68 bpm  66 bpm   Oxygen Saturation (Admit)  97 %  99 %  98 %  98 %  96 % 2 liters   Oxygen Saturation (Exercise)  87 % with forehead probe  76 % Sat on TM down O2 on 6L increased to 10L. Oxymizer placed.  83 % Sat on TM down O2 on 6L increased to 10L. Oxymizer placed.  86 % 15 L  84 % 10L INCREASED TO 15L   Oxygen Saturation (Exit)  95 %  95 %  95 %  92 %  96 % 2 liters   Rating of Perceived Exertion (Exercise)  _5 Perceived Dyspnea (Exercise)  0  _6 Duration  Continue with 45 min of aerobic exercise without signs/symptoms of physical distress.  Continue with 45 min of aerobic exercise without signs/symptoms of physical distress.  Continue with 45 min of aerobic exercise  without signs/symptoms of physical distress.  Continue with 45 min of aerobic exercise without signs/symptoms of physical distress.  Continue with 45 min of aerobic exercise without signs/symptoms of physical distress.   Intensity  THRR unchanged  THRR unchanged  THRR unchanged  THRR unchanged  THRR unchanged     Progression   Progression  Continue to progress workloads to maintain intensity without signs/symptoms of physical distress.  Continue to progress workloads to maintain intensity without signs/symptoms of physical distress.  Continue to progress workloads to maintain intensity without signs/symptoms of physical distress.  Continue to progress workloads to maintain intensity without signs/symptoms of physical distress.  Continue to progress workloads to maintain intensity without signs/symptoms of physical distress.     Resistance Training   Training Prescription  Yes  Yes  Yes  Yes  Yes   Weight  orange bands  orange bands  orange bands  orange bands  orange bands   Reps  10-15  10-15  10-15  10-15  10-15   Time  10 Minutes  10 Minutes  10 Minutes  10 Minutes  10 Minutes     Oxygen   Oxygen  Continuous  Continuous  Continuous  Continuous  Continuous   Liters  8-10  8-10  8-_0 Treadmill   MPH  2.2  2.1  2.1  2.4  -   Grade  0  0  0  3  -   Minutes  _1 -     Bike   Level  0.8  0.8  1  1.4  1 decreased workload per patients request. still desatuates   Minutes  _2 NuStep   Level  3  4  -  4  4   Minutes  17  17  -  17  17   METs  1.7  1.8  -  2.8  2.8   Row Name 05/04/17 1200 05/06/17 1200 05/11/17 1200 05/13/17 1209 05/18/17 1600     Response to Exercise   Blood Pressure (Admit)  90/52  90/50  96/62  90/58  116/60   Blood Pressure (Exercise)  -  110/62  120/64  142/62  152/72   Blood Pressure (Exit)  100/50  98/58  106/60  116/68  120/60   Heart Rate (Admit)  62 bpm  59 bpm  67 bpm  68 bpm  61 bpm   Heart Rate (Exercise)  103 bpm   101 bpm  92 bpm  106 bpm  102 bpm   Heart Rate (Exit)  66 bpm  69 bpm  65 bpm  76 bpm  72 bpm   Oxygen Saturation (Admit)  99 %  93 %  92 %  90 %  99 %   Oxygen Saturation (Exercise)  81 % O2 increased to 15 L sat up to 88  83 % O2 increased to 15L sat up to 88%  84 % O2 increased to 15L sat up to 88%  74 % o2 increased to 25L and patient pulled off [employer] bike  85 %   Oxygen Saturation (Exit)  95 %  97 %  96 %  92 %  96 %   Rating of Perceived Exertion (Exercise)  _3 Perceived Dyspnea (Exercise)  _4 Duration  Continue with 45 min of aerobic exercise without signs/symptoms of physical distress.  Continue with 45 min of aerobic exercise without signs/symptoms of physical distress.  Continue with 45  min of aerobic exercise without signs/symptoms of physical distress.  Continue with 45 min of aerobic exercise without signs/symptoms of physical distress.  Continue with 45 min of aerobic exercise without signs/symptoms of physical distress.   Intensity  THRR unchanged  THRR unchanged  THRR unchanged  THRR unchanged  THRR unchanged     Progression   Progression  Continue to progress workloads to maintain intensity without signs/symptoms of physical distress.  Continue to progress workloads to maintain intensity without signs/symptoms of physical distress.  Continue to progress workloads to maintain intensity without signs/symptoms of physical distress.  Continue to progress workloads to maintain intensity without signs/symptoms of physical distress.  Continue to progress workloads to maintain intensity without signs/symptoms of physical distress.     Resistance Training   Training Prescription  Yes  Yes  Yes  Yes  Yes   Weight  orange bands  orange bands  orange bands  orange bands  orange bands   Reps  10-15  10-15  10-15  10-15  10-15   Time  10 Minutes  10 Minutes  10 Minutes  10 Minutes  10 Minutes     Oxygen   Oxygen  Continuous  Continuous  Continuous  Continuous   Continuous   Liters  _0 Treadmill   MPH  2.4  2.4  2.4  2.4  2.4   Grade  0  0  _1 Minutes  _2 Bike   Level  1.4  1.4  -  1.4  1.4   Minutes  17  17  -  -  -     NuStep   Level  4  -  _3 Minutes  17  -  _4 METs  -  -  2.5  2.7  2.9   Row Name 05/20/17 1200 05/25/17 1226 05/27/17 1200 06/01/17 1200 06/03/17 1200     Response to Exercise   Blood Pressure (Admit)  104/60  108/64  116/64  112/62  110/70   Blood Pressure (Exercise)  134/66  158/70  130/68  128/70  120/72   Blood Pressure (Exit)  106/60  106/60  118/70  114/72  112/70   Heart Rate (Admit)  76 bpm  65 bpm  62 bpm  70 bpm  63 bpm   Heart Rate (Exercise)  108 bpm  112 bpm  109 bpm  98 bpm  103 bpm   Heart Rate (Exit)  65 bpm  79 bpm  75 bpm  67 bpm  65 bpm   Oxygen Saturation (Admit)  96 %  95 %  94 %  96 %  100 %   Oxygen Saturation (Exercise)  86 % 15 liters moved to 25liters  85 %  86 %  95 %  93 %   Oxygen Saturation (Exit)  90 %  89 %  92 %  92 %  98 %   Rating of Perceived Exertion (Exercise)  _5 Perceived Dyspnea (Exercise)  _6 Duration  Continue with 45 min of aerobic exercise without signs/symptoms of physical distress.  Continue with 45 min of aerobic exercise without signs/symptoms of physical distress.  Continue  with 45 min of aerobic exercise without signs/symptoms of physical distress.  Continue with 45 min of aerobic exercise without signs/symptoms of physical distress.  Continue with 45 min of aerobic exercise without signs/symptoms of physical distress.   Intensity  THRR unchanged  THRR unchanged  THRR unchanged  THRR unchanged  THRR unchanged     Progression   Progression  Continue to progress workloads to maintain intensity without signs/symptoms of physical distress.  Continue to progress workloads to maintain intensity without signs/symptoms of physical distress.  Continue to progress workloads to maintain  intensity without signs/symptoms of physical distress.  Continue to progress workloads to maintain intensity without signs/symptoms of physical distress.  Continue to progress workloads to maintain intensity without signs/symptoms of physical distress.     Resistance Training   Training Prescription  Yes  Yes  Yes  Yes  Yes   Weight  orange bands  orange bands  orange bands  orange bands  orange bands   Reps  10-15  10-15  10-15  10-15  10-15   Time  10 Minutes  10 Minutes  10 Minutes  10 Minutes  10 Minutes     Oxygen   Oxygen  Continuous  Continuous  -  Continuous  Continuous   Liters  25  25  -  25  25     Treadmill   MPH  -  2.4  -  2.4  2.4   Grade  -  3  -  3  3   Minutes  -  17  -  17  34     Bike   Level  1.4  1.4  1.1  1  -   Minutes  _0 -     NuStep   Level  5  5  2.4  5  -   Minutes  _1 -   METs  2.5  2.8  3  2.1  -   Row Name 06/08/17 1200 06/10/17 1200 06/15/17 1200 06/17/17 1254 06/24/17 1200     Response to Exercise   Blood Pressure (Admit)  94/56  96/62  90/44  102/58  96/50   Blood Pressure (Exercise)  130/80  148/68  122/64  146/80  138/66   Blood Pressure (Exit)  100/60  114/66  112/68  114/70  92/60   Heart Rate (Admit)  65 bpm  61 bpm  78 bpm  54 bpm  52 bpm   Heart Rate (Exercise)  107 bpm  115 bpm  100 bpm  104 bpm  102 bpm   Heart Rate (Exit)  77 bpm  72 bpm  60 bpm  69 bpm  88 bpm   Oxygen Saturation (Admit)  94 %  93 %  93 %  92 %  97 %   Oxygen Saturation (Exercise)  93 %  95 %  85 %  98 %  95 %   Oxygen Saturation (Exit)  89 %  97 %  93 %  96 %  96 %   Rating of Perceived Exertion (Exercise)  _2 Perceived Dyspnea (Exercise)  2  0  _3 Duration  Continue with 45 min of aerobic exercise without signs/symptoms of physical distress.  Continue with 45 min of aerobic exercise without signs/symptoms of physical distress.  Continue with 45 min  of aerobic exercise without signs/symptoms of physical distress.   Continue with 45 min of aerobic exercise without signs/symptoms of physical distress.  Continue with 45 min of aerobic exercise without signs/symptoms of physical distress.   Intensity  THRR unchanged  THRR unchanged  THRR unchanged  THRR unchanged  THRR unchanged     Progression   Progression  Continue to progress workloads to maintain intensity without signs/symptoms of physical distress.  Continue to progress workloads to maintain intensity without signs/symptoms of physical distress.  Continue to progress workloads to maintain intensity without signs/symptoms of physical distress.  Continue to progress workloads to maintain intensity without signs/symptoms of physical distress.  Continue to progress workloads to maintain intensity without signs/symptoms of physical distress.     Resistance Training   Training Prescription  Yes  Yes  Yes  Yes  Yes   Weight  orange bands  orange bands  orange bands  orange bands  orange bands   Reps  10-15  10-15  10-15  10-15  10-15   Time  10 Minutes  10 Minutes  10 Minutes  10 Minutes  10 Minutes     Oxygen   Oxygen  Continuous  Continuous  Continuous  Continuous  Continuous   Liters  _0 Treadmill   MPH  2  2.9  2.2 patient not feeling well today   3  3   Grade  _1 Minutes  34  34  34  34  17     Bike   Level  -  -  1.4  -  1.4   Minutes  -  -  17  -  17     NuStep   Level  -  5  -  -  -   Minutes  -  17  -  -  -   METs  -  2.1  -  -  -   Row Name 06/29/17 1213 07/01/17 1239 07/06/17 1200 07/08/17 1316 07/13/17 1200     Response to Exercise   Blood Pressure (Admit)  98/66  100/50  100/54  106/66  100/56   Blood Pressure (Exercise)  110/60  -  140/70  132/80  114/70   Blood Pressure (Exit)  108/62  104/64  98/56  118/72  96/58   Heart Rate (Admit)  54 bpm  54 bpm  61 bpm  60 bpm  66 bpm   Heart Rate (Exercise)  108 bpm  108 bpm  107 bpm  108 bpm  102 bpm   Heart Rate (Exit)  60 bpm  70 bpm  67 bpm  70 bpm  79  bpm   Oxygen Saturation (Admit)  97 %  96 %  94 %  91 %  96 %   Oxygen Saturation (Exercise)  92 %  90 %  89 %  95 %  90 %   Oxygen Saturation (Exit)  94 %  97 %  93 %  94 %  91 %   Rating of Perceived Exertion (Exercise)  _2 Perceived Dyspnea (Exercise)  _3 Duration  Continue with 45 min of aerobic exercise without signs/symptoms of physical distress.  Continue with 45 min of aerobic exercise without signs/symptoms of physical distress.  Continue with 45 min of aerobic exercise without signs/symptoms of physical distress.  Continue with 45 min of aerobic exercise without signs/symptoms of physical distress.  Continue with 45 min of aerobic exercise without signs/symptoms of physical distress.   Intensity  THRR unchanged  THRR unchanged  THRR unchanged  THRR unchanged  THRR unchanged     Progression   Progression  Continue to progress workloads to maintain intensity without signs/symptoms of physical distress.  Continue to progress workloads to maintain intensity without signs/symptoms of physical distress.  Continue to progress workloads to maintain intensity without signs/symptoms of physical distress.  Continue to progress workloads to maintain intensity without signs/symptoms of physical distress.  Continue to progress workloads to maintain intensity without signs/symptoms of physical distress.     Resistance Training   Training Prescription  Yes  Yes  Yes  Yes  Yes   Weight  orange bands  orange bands  orange bands  orange bands  orange bands   Reps  10-15  10-15  10-15  10-15  10-15   Time  10 Minutes  10 Minutes  10 Minutes  10 Minutes  10 Minutes     Oxygen   Oxygen  Continuous  Continuous  Continuous  Continuous  Continuous   Liters  _0 Treadmill   MPH  _1 Grade  _2 Minutes  34  34  34  34  34     Bike   Level  -  -  1.4  -  1.5   Minutes  -  -  17  -  17     NuStep   Level  5  -  -  -  -   Minutes   17  -  -  -  -   METs  2.2  -  -  -  -   Row Name 07/15/17 1200             Response to Exercise   Blood Pressure (Admit)  98/60       Blood Pressure (Exercise)  140/80       Blood Pressure (Exit)  110/60       Heart Rate (Admit)  54 bpm       Heart Rate (Exercise)  102 bpm       Heart Rate (Exit)  65 bpm       Oxygen Saturation (Admit)  97 %       Oxygen Saturation (Exercise)  97 %       Oxygen Saturation (Exit)  96 %       Rating of Perceived Exertion (Exercise)  12       Perceived Dyspnea (Exercise)  2       Duration  Continue with 45 min of aerobic exercise without signs/symptoms of physical distress.       Intensity  THRR unchanged         Progression   Progression  Continue to progress workloads to maintain intensity without signs/symptoms of physical distress.         Resistance Training   Training Prescription  Yes       Weight  orange bands       Reps  10-15       Time  10 Minutes  Oxygen   Oxygen  Continuous       Liters  25         Treadmill   MPH  3       Grade  5       Minutes  34          Exercise Comments:   Exercise Goals and Review: Exercise Goals    Row Name 03/19/17 1125             Exercise Goals   Increase Physical Activity  Yes       Intervention  Provide advice, education, support and counseling about physical activity/exercise needs.;Develop an individualized exercise prescription for aerobic and resistive training based on initial evaluation findings, risk stratification, comorbidities and participant's personal goals.       Expected Outcomes  Achievement of increased cardiorespiratory fitness and enhanced flexibility, muscular endurance and strength shown through measurements of functional capacity and personal statement of participant.       Increase Strength and Stamina  Yes       Intervention  Provide advice, education, support and counseling about physical activity/exercise needs.;Develop an individualized exercise  prescription for aerobic and resistive training based on initial evaluation findings, risk stratification, comorbidities and participant's personal goals.       Expected Outcomes  Achievement of increased cardiorespiratory fitness and enhanced flexibility, muscular endurance and strength shown through measurements of functional capacity and personal statement of participant.          Exercise Goals Re-Evaluation : Exercise Goals Re-Evaluation    Row Name 03/26/17 1527 04/27/17 0814 05/17/17 1151 06/21/17 1006 07/15/17 1551     Exercise Goal Re-Evaluation   Exercise Goals Review  Increase Strenth and Stamina;Increase Physical Activity  Increase Physical Activity;Increase Strength and Stamina;Able to understand and use Dyspnea scale;Able to understand and use rate of perceived exertion (RPE) scale;Knowledge and understanding of Target Heart Rate Range (THRR);Understanding of Exercise Prescription  Increase Physical Activity;Increase Strength and Stamina;Able to understand and use Dyspnea scale;Knowledge and understanding of Target Heart Rate Range (THRR);Able to understand and use rate of perceived exertion (RPE) scale;Understanding of Exercise Prescription  Increase Strength and Stamina;Increase Physical Activity;Able to understand and use Dyspnea scale;Able to understand and use rate of perceived exertion (RPE) scale;Knowledge and understanding of Target Heart Rate Range (THRR);Understanding of Exercise Prescription  Increase Strength and Stamina;Increase Physical Activity;Able to understand and use Dyspnea scale;Able to understand and use rate of perceived exertion (RPE) scale;Knowledge and understanding of Target Heart Rate Range (THRR);Understanding of Exercise Prescription   Comments  Patient has only completed his 88mt. Will cont. to monitor and progress as able.   Patient has been desatruating at low workloads. Just receieved order from Dr. to increase liter flow to maintain oxygen saturation above  85%. Will now be able to increase workloads.   Patient has been increased to 25 liters during certain exercises. This causes him mental stress. Will cont. to monitor and progress as able on higher flows.    Patient is exercising consistently at his threshold. 25 liters most of the time. Open to workload changes. Will cont. to monitor and progress as able.   Patient is very motivated to work hard. His attendance is consistent. He is building his walking endurance on the treadmill. Working at his higher threshold. On 25 liters he is able to push himself and make noticeable gains. He is not exercising at home because he doesnt have 25 liters. Will cont. to monitor and motivate.  Expected Outcomes  Through the exercise at rehab and at home, patient will increase physical activity, strength, and stamina.   Through the exercise at rehab and at home, patient will increase physical activity, strength, and stamina.   Through the exercise at rehab and at home, patient will increase physical activity, strength, and stamina.   Through the exercise at rehab and at home, patient will increase physical activity, strength, and stamina.   Through exercise at rehab and at home, patient will increase strength and stamina making ADL's easier to perform. Patient will also have a better understanding of safe exercise and what they are capable to do outside of clinical supervision.      Discharge Exercise Prescription (Final Exercise Prescription Changes): Exercise Prescription Changes - 07/15/17 1200      Response to Exercise   Blood Pressure (Admit)  98/60    Blood Pressure (Exercise)  140/80    Blood Pressure (Exit)  110/60    Heart Rate (Admit)  54 bpm    Heart Rate (Exercise)  102 bpm    Heart Rate (Exit)  65 bpm    Oxygen Saturation (Admit)  97 %    Oxygen Saturation (Exercise)  97 %    Oxygen Saturation (Exit)  96 %    Rating of Perceived Exertion (Exercise)  12    Perceived Dyspnea (Exercise)  2    Duration   Continue with 45 min of aerobic exercise without signs/symptoms of physical distress.    Intensity  THRR unchanged      Progression   Progression  Continue to progress workloads to maintain intensity without signs/symptoms of physical distress.      Resistance Training   Training Prescription  Yes    Weight  orange bands    Reps  10-15    Time  10 Minutes      Oxygen   Oxygen  Continuous    Liters  25      Treadmill   MPH  3    Grade  5    Minutes  34       Nutrition:  Target Goals: Understanding of nutrition guidelines, daily intake of sodium <1557m, cholesterol <2039m calories 30% from fat and 7% or less from saturated fats, daily to have 5 or more servings of fruits and vegetables.  Biometrics: Pre Biometrics - 03/25/17 0700      Pre Biometrics   Grip Strength  36 kg        Nutrition Therapy Plan and Nutrition Goals: Nutrition Therapy & Goals - 04/29/17 1356      Nutrition Therapy   Diet  TLC      Personal Nutrition Goals   Nutrition Goal  Describe the benefit of including fruits, vegetables, whole grains, and low-fat dairy products in a healthy meal plan.    Personal Goal #2  Encourage wt gain to a minimum wt gain goal of 180 lb and long-term wt gain goal of ~195 lb.       Intervention Plan   Intervention  Prescribe, educate and counsel regarding individualized specific dietary modifications aiming towards targeted core components such as weight, hypertension, lipid management, diabetes, heart failure and other comorbidities.;Nutrition handout(s) given to patient. Handouts given for High Calorie, High Protein diet, recipes, and Suggestions for increasing calorie and protein intake    Expected Outcomes  Short Term Goal: Understand basic principles of dietary content, such as calories, fat, sodium, cholesterol and nutrients.;Long Term Goal: Adherence to prescribed nutrition plan.  Nutrition Discharge: Rate Your Plate Scores: Nutrition Assessments -  04/29/17 1357      Rate Your Plate Scores   Pre Score  64       Nutrition Goals Re-Evaluation:   Nutrition Goals Discharge (Final Nutrition Goals Re-Evaluation):   Psychosocial: Target Goals: Acknowledge presence or absence of significant depression and/or stress, maximize coping skills, provide positive support system. Participant is able to verbalize types and ability to use techniques and skills needed for reducing stress and depression.  Initial Review & Psychosocial Screening: Initial Psych Review & Screening - 03/19/17 1149      Initial Review   Current issues with  Current Depression      Family Dynamics   Good Support System?  Yes      Barriers   Psychosocial barriers to participate in program  Psychosocial barriers identified (see note);The patient should benefit from training in stress management and relaxation.      Screening Interventions   Interventions  Yes;To provide support and resources with identified psychosocial needs;Provide feedback about the scores to participant;Program counselor consult;Encouraged to exercise    Expected Outcomes  Short Term goal: Utilizing psychosocial counselor, staff and physician to assist with identification of specific Stressors or current issues interfering with healing process. Setting desired goal for each stressor or current issue identified.       Quality of Life Scores:   PHQ-9: Recent Review Flowsheet Data    Depression screen [facility] 2/9 03/19/2017   Decreased Interest 0   Down, Depressed, Hopeless 2   PHQ - 2 Score 2   Altered sleeping 0   Tired, decreased energy 1   Change in appetite 0   Feeling bad or failure about yourself  2   Trouble concentrating 0   Moving slowly or fidgety/restless 0   Suicidal thoughts 0   PHQ-9 Score 5   Difficult doing work/chores Not difficult at all     Interpretation of Total Score  Total Score Depression Severity:  1-4 = Minimal depression, 5-9 = Mild depression, 10-14 =  Moderate depression, 15-19 = Moderately severe depression, 20-27 = Severe depression   Psychosocial Evaluation and Intervention: Psychosocial Evaluation - 03/19/17 1159      Psychosocial Evaluation & Interventions   Comments  referral to Jeanella Craze  (Pended)     Continue Psychosocial Services   Follow up required by staff  Roni Bread)        Psychosocial Re-Evaluation: Psychosocial Re-Evaluation    Row Name 04/28/17 1728 05/21/17 1212 06/20/17 2145 07/12/17 1605       Psychosocial Re-Evaluation   Current issues with  Current Stress Concerns;Current Depression;History of Depression;Current Anxiety/Panic;Current Sleep Concerns  Current Stress Concerns;Current Depression;History of Depression;Current Anxiety/Panic;Current Sleep Concerns  Current Depression;History of Depression;Current Sleep Concerns;Current Stress Concerns  Current Depression;History of Depression;Current Sleep Concerns;Current Stress Concerns    Comments  patient has significant depression. it is affecting his participation in rehab. he has been referred to [facility] and sees him on a regular basis for counceling  patient has significant depression. it is affecting his participation in rehab. he has been referred to [facility] and sees him on a regular basis for counceling. He has been placed on an antidepressant by his pcp and seems to be happier and more positive in rehab  patient felt he was beginning to see a positive difference in his mood until he was declined for transplant. now he is not pushing himself as hard in the gym and he is reluctant  to workload increases. he continues to meet with a counselor weekly  patient has grieved the loss of a potential transplant and is most recently working harder than ever to maintain his level of fittness and to prevent further decline in his functional ability.    Expected Outcomes  patient will have a reduction in his psychosocial barrier symptoms (a reduction in tearfulness while  at rehab)  patient will have a reduction in his psychosocial barrier symptoms (a reduction in tearfulness while at rehab)  patient will have a reduction in his psychosocial barrier symptoms (a reduction in tearfulness while at rehab)  patient will have a reduction in his psychosocial barrier symptoms (a reduction in tearfulness while at rehab)    Interventions  Physician referral;Therapist referral;Encouraged to attend Pulmonary Rehabilitation for the exercise  Physician referral;Therapist referral;Encouraged to attend Pulmonary Rehabilitation for the exercise  Physician referral;Therapist referral;Encouraged to attend Pulmonary Rehabilitation for the exercise  Physician referral;Therapist referral;Encouraged to attend Pulmonary Rehabilitation for the exercise    Continue Psychosocial Services   Follow up required by staff  Follow up required by staff  Follow up required by staff  Follow up required by staff      Initial Review   Source of Stress Concerns  Chronic Illness;Family;Unable to participate in former interests or hobbies;Unable to perform yard/household activities;Retirement/disability  Chronic Illness;Family;Unable to participate in former interests or hobbies;Unable to perform yard/household activities;Retirement/disability  Chronic Illness;Family;Unable to participate in former interests or hobbies;Unable to perform yard/household activities;Retirement/disability  Chronic Illness;Family;Unable to participate in former interests or hobbies;Unable to perform yard/household activities;Retirement/disability       Psychosocial Discharge (Final Psychosocial Re-Evaluation): Psychosocial Re-Evaluation - 07/12/17 1605      Psychosocial Re-Evaluation   Current issues with  Current Depression;History of Depression;Current Sleep Concerns;Current Stress Concerns    Comments  patient has grieved the loss of a potential transplant and is most recently working harder than ever to maintain his level of  fittness and to prevent further decline in his functional ability.    Expected Outcomes  patient will have a reduction in his psychosocial barrier symptoms (a reduction in tearfulness while at rehab)    Interventions  Physician referral;Therapist referral;Encouraged to attend Pulmonary Rehabilitation for the exercise    Continue Psychosocial Services   Follow up required by staff      Initial Review   Source of Stress Concerns  Chronic Illness;Family;Unable to participate in former interests or hobbies;Unable to perform yard/household activities;Retirement/disability       Education: Education Goals: Education classes will be provided on a weekly basis, covering required topics. Participant will state understanding/return demonstration of topics presented.  Learning Barriers/Preferences: Learning Barriers/Preferences - 03/19/17 1147      Learning Barriers/Preferences   Learning Barriers  None    Learning Preferences  Written Material;Skilled Demonstration;Individual Instruction;Group Instruction       Education Topics: Risk Factor Reduction:  -Group instruction that is supported by a PowerPoint presentation. Instructor discusses the definition of a risk factor, different risk factors for pulmonary disease, and how the heart and lungs work together.     Nutrition for Pulmonary Patient:  -Group instruction provided by PowerPoint slides, verbal discussion, and written materials to support subject matter. The instructor gives an explanation and review of healthy diet recommendations, which includes a discussion on weight management, recommendations for fruit and vegetable consumption, as well as protein, fluid, caffeine, fiber, sodium, sugar, and alcohol. Tips for eating when patients are short of breath are discussed.   PULMONARY  REHAB OTHER RESPIRATORY from 07/15/2017 in [place]  Date  06/17/17  Educator  Nutritionist  Instruction Review Code  2- meets  goals/outcomes      Pursed Lip Breathing:  -Group instruction that is supported by demonstration and informational handouts. Instructor discusses the benefits of pursed lip and diaphragmatic breathing and detailed demonstration on how to preform both.     PULMONARY REHAB OTHER RESPIRATORY from 07/15/2017 in [place]  Date  05/06/17  Educator  RT  Instruction Review Code  2- meets goals/outcomes      Oxygen Safety:  -Group instruction provided by PowerPoint, verbal discussion, and written material to support subject matter. There is an overview of "What is Oxygen" and "Why do we need it".  Instructor also reviews how to create a safe environment for oxygen use, the importance of using oxygen as prescribed, and the risks of noncompliance. There is a brief discussion on traveling with oxygen and resources the patient may utilize.   PULMONARY REHAB OTHER RESPIRATORY from 07/15/2017 in [place]  Date  04/08/17  Educator  Truddie Crumble  Instruction Review Code  2- meets goals/outcomes      Oxygen Equipment:  -Group instruction provided by [employer] Staff utilizing handouts, written materials, and equipment demonstrations.   Signs and Symptoms:  -Group instruction provided by written material and verbal discussion to support subject matter. Warning signs and symptoms of infection, stroke, and heart attack are reviewed and when to call the physician/911 reinforced. Tips for preventing the spread of infection discussed.   PULMONARY REHAB OTHER RESPIRATORY from 07/15/2017 in [place]  Date  05/20/17  Educator  RN  Instruction Review Code  2- meets goals/outcomes      Advanced Directives:  -Group instruction provided by verbal instruction and written material to support subject matter. Instructor reviews Advanced Directive laws and proper instruction for filling out document.   Pulmonary  Video:  -Group video education that reviews the importance of medication and oxygen compliance, exercise, good nutrition, pulmonary hygiene, and pursed lip and diaphragmatic breathing for the pulmonary patient.   PULMONARY REHAB OTHER RESPIRATORY from 07/15/2017 in [place]  Date  05/27/17  Instruction Review Code  2- meets goals/outcomes      Exercise for the Pulmonary Patient:  -Group instruction that is supported by a PowerPoint presentation. Instructor discusses benefits of exercise, core components of exercise, frequency, duration, and intensity of an exercise routine, importance of utilizing pulse oximetry during exercise, safety while exercising, and options of places to exercise outside of rehab.     PULMONARY REHAB OTHER RESPIRATORY from 07/15/2017 in [place]  Date  07/15/17  Educator  Cloyde Reams  Instruction Review Code  2- meets goals/outcomes      Pulmonary Medications:  -Verbally interactive group education provided by instructor with focus on inhaled medications and proper administration.   PULMONARY REHAB OTHER RESPIRATORY from 07/15/2017 in [place]  Date  07/01/17  Educator  pharmacist  Instruction Review Code  R- Review/reinforce      Anatomy and Physiology of the Respiratory System and Intimacy:  -Group instruction provided by PowerPoint, verbal discussion, and written material to support subject matter. Instructor reviews respiratory cycle and anatomical components of the respiratory system and their functions. Instructor also reviews differences in obstructive and restrictive respiratory diseases with examples of each. Intimacy, Sex, and  Sexuality differences are reviewed with a discussion on how relationships can change when diagnosed with pulmonary disease. Common sexual concerns are reviewed.   MD DAY -A group question and answer session with a medical doctor that  allows participants to ask questions that relate to their pulmonary disease state.   PULMONARY REHAB OTHER RESPIRATORY from 07/15/2017 in [place]  Date  06/08/17  Educator  yacoub  Instruction Review Code  2- meets goals/outcomes      OTHER EDUCATION -Group or individual verbal, written, or video instructions that support the educational goals of the pulmonary rehab program.   PULMONARY REHAB OTHER RESPIRATORY from 07/15/2017 in [place]  Date  07/08/17 [Holiday Eating]  Educator  RD  Instruction Review Code  1- Verbalizes Understanding      Knowledge Questionnaire Score: Knowledge Questionnaire Score - 03/31/17 0825      Knowledge Questionnaire Score   Pre Score  11/13       Core Components/Risk Factors/Patient Goals at Admission: Personal Goals and Risk Factors at Admission - 03/19/17 1148      Core Components/Risk Factors/Patient Goals on Admission   Improve shortness of breath with ADL's  Yes    Intervention  Provide education, individualized exercise plan and daily activity instruction to help decrease symptoms of SOB with activities of daily living.    Expected Outcomes  Short Term: Achieves a reduction of symptoms when performing activities of daily living.    Develop more efficient breathing techniques such as purse lipped breathing and diaphragmatic breathing; and practicing self-pacing with activity  Yes    Intervention  Provide education, demonstration and support about specific breathing techniuqes utilized for more efficient breathing. Include techniques such as pursed lipped breathing, diaphragmatic breathing and self-pacing activity.    Expected Outcomes  Short Term: Participant will be able to demonstrate and use breathing techniques as needed throughout daily activities.    Stress  Yes    Intervention  Offer individual and/or small group education and counseling on adjustment to heart disease,  stress management and health-related lifestyle change. Teach and support self-help strategies.;Refer participants experiencing significant psychosocial distress to appropriate mental health specialists for further evaluation and treatment. When possible, include family members and significant others in education/counseling sessions.    Expected Outcomes  Short Term: Participant demonstrates changes in health-related behavior, relaxation and other stress management skills, ability to obtain effective social support, and compliance with psychotropic medications if prescribed.;Long Term: Emotional wellbeing is indicated by absence of clinically significant psychosocial distress or social isolation.       Core Components/Risk Factors/Patient Goals Review:  Goals and Risk Factor Review    Row Name 04/28/17 1724 05/21/17 1210 06/20/17 2141 07/12/17 1601       Core Components/Risk Factors/Patient Goals Review   Personal Goals Review  Improve shortness of breath with ADL's;Develop more efficient breathing techniques such as purse lipped breathing and diaphragmatic breathing and practicing self-pacing with activity.;Stress  Improve shortness of breath with ADL's;Develop more efficient breathing techniques such as purse lipped breathing and diaphragmatic breathing and practicing self-pacing with activity.;Stress  Improve shortness of breath with ADL's;Develop more efficient breathing techniques such as purse lipped breathing and diaphragmatic breathing and practicing self-pacing with activity.;Stress  Improve shortness of breath with ADL's;Develop more efficient breathing techniques such as purse lipped breathing and diaphragmatic breathing and practicing self-pacing with activity.;Stress    Review  Patient has struggles significantly in the program thus far. he is unable  to exercise at what he feels to be a sufficient workload related to significant desaturations. recently recieved order from Dr. Lake Bells for  oxygen titration to maintain saturations >85%. Patient also has significant depression. he has regular sessions with Jeanella Craze. he has been encouraged to seek medical advice for his depression. he is constantly tearful during his rehab session and requires alot of one on one attention to discuss stressors and exercise intolerance. he had a follow-up with pulmonologist today and hopefully these issues will be addressed.  patient has been placed on an antidepressant and is more accepting of his high oxygen requirements. this acceptance has allowed [place] to increase his workloads as tolerated and he acutally stated this past session that he is beginning to tell a difference in how he feels. He is able to better tolerate workload increases and feels he can do more at home.  patient is on an Statistician. he was referred to [facility] for transplant and recieved a formed letter in the mail telling him he was declined. He then recieved a phone call from his local pulmonologist stating that he(pulmonologist) would still like to referr him to pulmonologist at [facility] for a second opinion for medical care. Patient reluctantly agreed. he states he can tell a difference in his stamina and strength however his high and low moods definitely affect his performance in rehab.  patient continues to be on an Statistician. Denied for transplant however he has been re-evaluated at [place] for a second opinion and also referred to a pulmonary vascular physician. He states he is feeling stronger and his shortness of breath is getting better especially with walking on an incline on the treadmill. He utilizes PLB without cueing. His stress levels have not improved related to the issues previously stated. He feels if he could have some concrete information or decisions about his diagnosis/prognosis he would more readily accept what is to come.    Expected Outcomes  see admission outcomes  see admission outcomes  see admission  outcomes  see admission outcomes       Core Components/Risk Factors/Patient Goals at Discharge (Final Review):  Goals and Risk Factor Review - 07/12/17 1601      Core Components/Risk Factors/Patient Goals Review   Personal Goals Review  Improve shortness of breath with ADL's;Develop more efficient breathing techniques such as purse lipped breathing and diaphragmatic breathing and practicing self-pacing with activity.;Stress    Review  patient continues to be on an Statistician. Denied for transplant however he has been re-evaluated at [facility] for a second opinion and also referred to a pulmonary vascular physician. He states he is feeling stronger and his shortness of breath is getting better especially with walking on an incline on the treadmill. He utilizes PLB without cueing. His stress levels have not improved related to the issues previously stated. He feels if he could have some concrete information or decisions about his diagnosis/prognosis he would more readily accept what is to come.    Expected Outcomes  see admission outcomes       ITP Comments:   Comments: ITP REVIEW Pt is making expected progress toward pulmonary rehab goals after completing 31 sessions. He was extended from 24 related to his slow progression. Recommend continued exercise, life style modification, education, and utilization of breathing techniques to increase stamina and strength and decrease shortness of breath with exertion.

## 2017-07-15 NOTE — Progress Notes (Signed)
Daily Session Note  Patient Details  Name: [name] MRN: [id number] Date of Birth: 1946/08/30 Referring Provider:     Pulmonary Rehab Walk Test from 03/23/2017 in [place]  Referring Provider  Dr. Haroldine Laws      Encounter Date: 07/15/2017  Check In: Session Check In - 07/15/17 1030      Check-In   Location  [facility]    Staff Present  Rosebud Poles, RN, BSN;Molly diVincenzo, MS, ACSM RCEP, Exercise Physiologist;Mayerli Kirst Ysidro Evert, RN;Portia Rollene Rotunda, RN, BSN    Supervising physician immediately available to respond to emergencies  [facility] immediately available    Physician(s)  Dr. Broadus John    Medication changes reported      No    Fall or balance concerns reported     No    Tobacco Cessation  No Change    Warm-up and Cool-down  Performed as group-led instruction    Resistance Training Performed  Yes    VAD Patient?  No      Pain Assessment   Currently in Pain?  No/denies    Multiple Pain Sites  No       Capillary Blood Glucose: No results found for this or any previous visit (from the past 24 hour(s)).  Exercise Prescription Changes - 07/15/17 1200      Response to Exercise   Blood Pressure (Admit)  98/60    Blood Pressure (Exercise)  140/80    Blood Pressure (Exit)  110/60    Heart Rate (Admit)  54 bpm    Heart Rate (Exercise)  102 bpm    Heart Rate (Exit)  65 bpm    Oxygen Saturation (Admit)  97 %    Oxygen Saturation (Exercise)  97 %    Oxygen Saturation (Exit)  96 %    Rating of Perceived Exertion (Exercise)  12    Perceived Dyspnea (Exercise)  2    Duration  Continue with 45 min of aerobic exercise without signs/symptoms of physical distress.    Intensity  THRR unchanged      Progression   Progression  Continue to progress workloads to maintain intensity without signs/symptoms of physical distress.      Resistance Training   Training Prescription  Yes    Weight  orange bands    Reps  10-15    Time   10 Minutes      Oxygen   Oxygen  Continuous    Liters  25      Treadmill   MPH  3    Grade  5    Minutes  34       Social History   Tobacco Use  Smoking Status Former Smoker  . Packs/day: 1.00  . Years: 18.00  . Pack years: 18.00  . Last attempt to quit: 09/27/1985  . Years since quitting: 31.8  Smokeless Tobacco Never Used    Goals Met:  Exercise tolerated well No report of cardiac concerns or symptoms Strength training completed today  Goals Unmet:  Not Applicable  Comments: Service time is from 1030 to 1225    Dr. Rush Farmer is Medical Director for Pulmonary Rehab at [facility].

## 2017-07-20 ENCOUNTER — Encounter (HOSPITAL_COMMUNITY)
Admission: RE | Admit: 2017-07-20 | Discharge: 2017-07-20 | Disposition: A | Discharge status: Home / Self Care | Payer: Medicare Other | Source: Ambulatory Visit | Attending: Pulmonary Disease | Admitting: Pulmonary Disease

## 2017-07-20 VITALS — Wt 168.7 lb

## 2017-07-20 DIAGNOSIS — I272 Pulmonary hypertension, unspecified: Secondary | ICD-10-CM

## 2017-07-20 NOTE — Progress Notes (Signed)
Daily Session Note  Patient Details  Name: [name] MRN: [id number] Date of Birth: 02/17/1946 Referring Provider:     Pulmonary Rehab Walk Test from 03/23/2017 in [facility]  Referring Provider  Dr. Bensimhon      Encounter Date: 07/20/2017  Check In: Session Check In - 07/20/17 1226      Check-In   Location  [facility]    Staff Present   , MS, ACSM RCEP, Exercise Physiologist;Portia Payne, RN, BSN;Lisa Hughes, RN;Joan Behrens, RN, BSN    Supervising physician immediately available to respond to emergencies  [facility] immediately available    Physician(s)  Dr. Choi    Medication changes reported      No    Fall or balance concerns reported     No    Tobacco Cessation  No Change    Warm-up and Cool-down  Performed as group-led instruction    Resistance Training Performed  Yes    VAD Patient?  No      Pain Assessment   Currently in Pain?  No/denies    Multiple Pain Sites  No       Capillary Blood Glucose: No results found for this or any previous visit (from the past 24 hour(s)).  Exercise Prescription Changes - 07/20/17 1200      Response to Exercise   Blood Pressure (Admit)  100/54    Blood Pressure (Exercise)  130/60    Blood Pressure (Exit)  102/60    Heart Rate (Admit)  56 bpm    Heart Rate (Exercise)  103 bpm    Heart Rate (Exit)  76 bpm    Oxygen Saturation (Admit)  97 %    Oxygen Saturation (Exercise)  89 %    Oxygen Saturation (Exit)  93 %    Rating of Perceived Exertion (Exercise)  12    Perceived Dyspnea (Exercise)  2    Duration  Continue with 45 min of aerobic exercise without signs/symptoms of physical distress.    Intensity  THRR unchanged      Progression   Progression  Continue to progress workloads to maintain intensity without signs/symptoms of physical distress.      Resistance Training   Training Prescription  Yes    Weight  orange bands    Reps  10-15    Time   10 Minutes      Oxygen   Oxygen  Continuous    Liters  25      Treadmill   MPH  3    Grade  5    Minutes  34      Bike   Level  1.4    Minutes  17       Social History   Tobacco Use  Smoking Status Former Smoker  . Packs/day: 1.00  . Years: 18.00  . Pack years: 18.00  . Last attempt to quit: 09/27/1985  . Years since quitting: 31.8  Smokeless Tobacco Never Used    Goals Met:  Exercise tolerated well No report of cardiac concerns or symptoms Strength training completed today  Goals Unmet:  Not Applicable  Comments: Service time is from 10:30a to 12:10p    Dr. Wesam G. Yacoub is Medical Director for Pulmonary Rehab at [facility]. 

## 2017-07-27 ENCOUNTER — Encounter (HOSPITAL_COMMUNITY)
Admission: RE | Admit: 2017-07-27 | Discharge: 2017-07-27 | Disposition: A | Discharge status: Home / Self Care | Payer: Medicare Other | Source: Ambulatory Visit | Attending: Internal Medicine | Admitting: Internal Medicine

## 2017-07-27 VITALS — Wt 169.1 lb

## 2017-07-27 DIAGNOSIS — I272 Pulmonary hypertension, unspecified: Secondary | ICD-10-CM

## 2017-07-27 NOTE — Progress Notes (Signed)
Daily Session Note  Patient Details  Name: [name] MRN: [id number] Date of Birth: 03-01-1946 Referring Provider:     Pulmonary Rehab Walk Test from 03/23/2017 in [place]  Referring Provider  Dr. Haroldine Laws      Encounter Date: 07/27/2017  Check In: Session Check In - 07/27/17 1030      Check-In   Location  [facility]    Staff Present  Rosebud Poles, RN, BSN;Molly diVincenzo, MS, ACSM RCEP, Exercise Physiologist;Lisa Ysidro Evert, RN;Eliyahu Bille Rollene Rotunda, RN, BSN    Supervising physician immediately available to respond to emergencies  [facility] immediately available    Physician(s)  Dr. Maylene Roes    Medication changes reported      No    Fall or balance concerns reported     No    Tobacco Cessation  No Change    Warm-up and Cool-down  Performed as group-led instruction    Resistance Training Performed  Yes    VAD Patient?  No      Pain Assessment   Currently in Pain?  No/denies    Multiple Pain Sites  No       Capillary Blood Glucose: No results found for this or any previous visit (from the past 24 hour(s)).  Exercise Prescription Changes - 07/27/17 1200      Response to Exercise   Blood Pressure (Admit)  114/60    Blood Pressure (Exercise)  108/66    Blood Pressure (Exit)  104/60    Heart Rate (Admit)  71 bpm    Heart Rate (Exercise)  112 bpm    Heart Rate (Exit)  80 bpm    Oxygen Saturation (Admit)  97 %    Oxygen Saturation (Exercise)  89 %    Oxygen Saturation (Exit)  96 %    Rating of Perceived Exertion (Exercise)  13    Perceived Dyspnea (Exercise)  3    Duration  Continue with 45 min of aerobic exercise without signs/symptoms of physical distress.    Intensity  THRR unchanged      Progression   Progression  Continue to progress workloads to maintain intensity without signs/symptoms of physical distress.      Resistance Training   Training Prescription  Yes    Weight  orange bands    Reps  10-15    Time   10 Minutes      Oxygen   Oxygen  Continuous    Liters  25      Treadmill   MPH  3.2    Grade  6    Minutes  34      Bike   Level  1.4    Minutes  17       Social History   Tobacco Use  Smoking Status Former Smoker  . Packs/day: 1.00  . Years: 18.00  . Pack years: 18.00  . Last attempt to quit: 09/27/1985  . Years since quitting: 31.8  Smokeless Tobacco Never Used    Goals Met:  Independence with exercise equipment Improved SOB with ADL's Using PLB without cueing & demonstrates good technique Exercise tolerated well No report of cardiac concerns or symptoms Strength training completed today  Goals Unmet:  Not Applicable  Comments: Service time is from 1030 to 1215   Dr. Rush Farmer is Medical Director for Pulmonary Rehab at [facility].

## 2017-07-29 ENCOUNTER — Encounter (HOSPITAL_COMMUNITY)
Admission: RE | Admit: 2017-07-29 | Discharge: 2017-07-29 | Disposition: A | Discharge status: Home / Self Care | Payer: Medicare Other | Source: Ambulatory Visit | Attending: Pulmonary Disease | Admitting: Pulmonary Disease

## 2017-07-29 VITALS — Wt 170.4 lb

## 2017-07-29 DIAGNOSIS — I272 Pulmonary hypertension, unspecified: Secondary | ICD-10-CM | POA: Diagnosis not present

## 2017-07-29 NOTE — Progress Notes (Signed)
Daily Session Note  Patient Details  Name: [name] MRN: [id number] Date of Birth: 28-Apr-1946 Referring Provider:     Pulmonary Rehab Walk Test from 03/23/2017 in [place]  Referring Provider  Dr. Haroldine Laws      Encounter Date: 07/29/2017  Check In: Session Check In - 07/29/17 1112      Check-In   Location  [facility]    Staff Present  Trish Fountain, RN, BSN;Lisa Ysidro Evert, RN;Brayen Bunn, MS, ACSM RCEP, Exercise Physiologist;Joan Leonia Reeves, RN, BSN    Supervising physician immediately available to respond to emergencies  [facility] immediately available    Physician(s)  Dr. [place] Crane    Medication changes reported      No    Fall or balance concerns reported     No    Tobacco Cessation  No Change    Warm-up and Cool-down  Performed as group-led instruction    Resistance Training Performed  Yes    VAD Patient?  No      Pain Assessment   Currently in Pain?  No/denies    Multiple Pain Sites  No       Capillary Blood Glucose: No results found for this or any previous visit (from the past 24 hour(s)).  Exercise Prescription Changes - 07/29/17 1300      Response to Exercise   Blood Pressure (Admit)  100/50    Blood Pressure (Exercise)  138/64    Blood Pressure (Exit)  110/70    Heart Rate (Admit)  57 bpm    Heart Rate (Exercise)  110 bpm    Heart Rate (Exit)  68 bpm    Oxygen Saturation (Admit)  92 %    Oxygen Saturation (Exercise)  92 %    Oxygen Saturation (Exit)  94 %    Rating of Perceived Exertion (Exercise)  12    Perceived Dyspnea (Exercise)  2    Duration  Continue with 45 min of aerobic exercise without signs/symptoms of physical distress.    Intensity  THRR unchanged      Progression   Progression  Continue to progress workloads to maintain intensity without signs/symptoms of physical distress.      Resistance Training   Training Prescription  Yes    Weight  orange bands    Reps  10-15    Time   10 Minutes      Oxygen   Oxygen  Continuous    Liters  25      Treadmill   MPH  3.2    Grade  6    Minutes  34       Social History   Tobacco Use  Smoking Status Former Smoker  . Packs/day: 1.00  . Years: 18.00  . Pack years: 18.00  . Last attempt to quit: 09/27/1985  . Years since quitting: 31.8  Smokeless Tobacco Never Used    Goals Met:  Exercise tolerated well No report of cardiac concerns or symptoms Strength training completed today  Goals Unmet:  Not Applicable  Comments: Service time is from 10:30 to 12:35p    Dr. Rush Farmer is Medical Director for Pulmonary Rehab at [facility].

## 2017-08-03 ENCOUNTER — Encounter (HOSPITAL_COMMUNITY)
Admission: RE | Admit: 2017-08-03 | Discharge: 2017-08-03 | Disposition: A | Discharge status: Home / Self Care | Payer: Medicare Other | Source: Ambulatory Visit | Attending: Pulmonary Disease | Admitting: Pulmonary Disease

## 2017-08-03 VITALS — Wt 171.1 lb

## 2017-08-03 DIAGNOSIS — I2510 Atherosclerotic heart disease of native coronary artery without angina pectoris: Secondary | ICD-10-CM | POA: Diagnosis not present

## 2017-08-03 DIAGNOSIS — Z79899 Other long term (current) drug therapy: Secondary | ICD-10-CM | POA: Insufficient documentation

## 2017-08-03 DIAGNOSIS — E785 Hyperlipidemia, unspecified: Secondary | ICD-10-CM | POA: Insufficient documentation

## 2017-08-03 DIAGNOSIS — F411 Generalized anxiety disorder: Secondary | ICD-10-CM | POA: Insufficient documentation

## 2017-08-03 DIAGNOSIS — Z7982 Long term (current) use of aspirin: Secondary | ICD-10-CM | POA: Insufficient documentation

## 2017-08-03 DIAGNOSIS — I272 Pulmonary hypertension, unspecified: Secondary | ICD-10-CM | POA: Insufficient documentation

## 2017-08-03 DIAGNOSIS — Z87891 Personal history of nicotine dependence: Secondary | ICD-10-CM | POA: Insufficient documentation

## 2017-08-03 NOTE — Progress Notes (Signed)
Daily Session Note  Patient Details  Name: [name] MRN: [id number] Date of Birth: 05-07-1946 Referring Provider:     Pulmonary Rehab Walk Test from 03/23/2017 in [place]  Referring Provider  Dr. Haroldine Laws      Encounter Date: 08/03/2017  Check In: Session Check In - 08/03/17 1215      Check-In   Location  [facility]    Staff Present  Trish Fountain, RN, BSN;Oleva Koo Ysidro Evert, RN;Molly diVincenzo, MS, ACSM RCEP, Exercise Physiologist;Joan Leonia Reeves, RN, BSN    Supervising physician immediately available to respond to emergencies  [facility] immediately available    Physician(s)  Dr. [place] Crane    Medication changes reported      No    Fall or balance concerns reported     No    Tobacco Cessation  No Change    Warm-up and Cool-down  Performed as group-led instruction    Resistance Training Performed  Yes    VAD Patient?  No      Pain Assessment   Currently in Pain?  No/denies    Multiple Pain Sites  No       Capillary Blood Glucose: No results found for this or any previous visit (from the past 24 hour(s)).  Exercise Prescription Changes - 08/03/17 1200      Response to Exercise   Blood Pressure (Admit)  102/56    Blood Pressure (Exercise)  156/76    Blood Pressure (Exit)  114/60    Heart Rate (Admit)  60 bpm    Heart Rate (Exercise)  110 bpm    Heart Rate (Exit)  71 bpm    Oxygen Saturation (Admit)  94 %    Oxygen Saturation (Exercise)  88 %    Oxygen Saturation (Exit)  94 %    Rating of Perceived Exertion (Exercise)  14    Perceived Dyspnea (Exercise)  2    Duration  Continue with 45 min of aerobic exercise without signs/symptoms of physical distress.    Intensity  THRR unchanged      Progression   Progression  Continue to progress workloads to maintain intensity without signs/symptoms of physical distress.      Resistance Training   Training Prescription  Yes    Weight  orange bands    Reps  10-15    Time   10 Minutes      Oxygen   Oxygen  Continuous    Liters  25      Treadmill   MPH  3.2    Grade  6    Minutes  34      Bike   Level  1.4    Minutes  17       Social History   Tobacco Use  Smoking Status Former Smoker  . Packs/day: 1.00  . Years: 18.00  . Pack years: 18.00  . Last attempt to quit: 09/27/1985  . Years since quitting: 31.8  Smokeless Tobacco Never Used    Goals Met:  Exercise tolerated well No report of cardiac concerns or symptoms Strength training completed today  Goals Unmet:  Not Applicable  Comments: Service time is from 1030 to 1205    Dr. Rush Farmer is Medical Director for Pulmonary Rehab at [facility].

## 2017-08-05 ENCOUNTER — Encounter (HOSPITAL_COMMUNITY)
Admission: RE | Admit: 2017-08-05 | Discharge: 2017-08-05 | Disposition: A | Discharge status: Home / Self Care | Payer: Medicare Other | Source: Ambulatory Visit

## 2017-08-05 DIAGNOSIS — I272 Pulmonary hypertension, unspecified: Secondary | ICD-10-CM

## 2017-08-05 NOTE — Progress Notes (Signed)
Pulmonary Individual Treatment Plan  Patient Details  Name: JAMAAR HOWES MRN: [id number] Date of Birth: [birth year] Referring Provider:     Pulmonary Rehab Walk Test from 03/23/2017 in [place]  Referring Provider  Dr. Haroldine Laws      Initial Encounter Date:    Pulmonary Rehab Walk Test from 03/23/2017 in [place]  Date  03/25/17  Referring Provider  Dr. Haroldine Laws      Visit Diagnosis: Pulmonary hypertension ([place])  Patient's Home Medications on Admission:   Current Outpatient Medications:  .  aspirin 81 MG tablet, Take 81 mg by mouth every evening. , Disp: , Rfl:  .  Azelastine-Fluticasone (DYMISTA) 137-50 MCG/ACT SUSP, Place 1-2 sprays into the nose daily., Disp: 1 Bottle, Rfl: 5 .  clonazePAM (KLONOPIN) 0.5 MG tablet, Take 0.5 mg 3 (three) times daily as needed by mouth for anxiety. , Disp: , Rfl: 0 .  Coenzyme Q10 (VITALINE COQ10) 100 MG WAFR, Take 60 mg by mouth daily., Disp: , Rfl:  .  DULoxetine (CYMBALTA) 30 MG capsule, TK 1 TO 2 CS PO QD, Disp: , Rfl:  .  ezetimibe (ZETIA) 10 MG tablet, Take 1 tablet (10 mg total) by mouth every evening., Disp: 90 tablet, Rfl: 3 .  Fluticasone-Umeclidin-Vilant (TRELEGY ELLIPTA) 100-62.5-25 MCG/INH AEPB, Inhale 1 puff into the lungs daily., Disp: 60 each, Rfl: 5 .  Lactobacillus (ACIDOPHILUS PO), Take 1 capsule by mouth daily. , Disp: , Rfl:  .  Macitentan (OPSUMIT) 10 MG TABS, Take 1 tablet (10 mg total) by mouth daily., Disp: 30 tablet, Rfl: 11 .  mirtazapine (REMERON) 15 MG tablet, Take 15 mg by mouth at bedtime., Disp: , Rfl:  .  Multiple Vitamins-Minerals (CENTRUM CARDIO PO), Take 1 tablet by mouth daily. , Disp: , Rfl:  .  Omega-3 Fatty Acids (OMEGA 3 PO), Take 3,200 mg by mouth 2 (two) times daily. Nordic Natural ultimate Omega 3 , Disp: , Rfl:  .  rosuvastatin (CRESTOR) 40 MG tablet, Take 1 tablet (40 mg total) by mouth daily., Disp: 90 tablet, Rfl: 3 .  rosuvastatin  (CRESTOR) 40 MG tablet, Take 40 mg daily by mouth., Disp: , Rfl:  .  sertraline (ZOLOFT) 25 MG tablet, , Disp: , Rfl:   Past Medical History: Past Medical History:  Diagnosis Date  . CAD (coronary artery disease)   . GAD (generalized anxiety disorder)   . Hyperlipidemia   . Overweight(278.02)   . Pulmonary hypertension (H[facility])     Tobacco Use: Social History   Tobacco Use  Smoking Status Former Smoker  . Packs/day: 1.00  . Years: 18.00  . Pack years: 18.00  . Last attempt to quit: 09/27/1985  . Years since quitting: 31.8  Smokeless Tobacco Never Used    Labs: Recent Chemical engineer    Labs for ITP Cardiac and Pulmonary Rehab Latest Ref Rng & Units 11/25/2015 06/12/2016 11/24/2016 11/24/2016 12/02/2016   Cholestrol <200 mg/dL 118(L) 101 - - -   LDLCALC <100 mg/dL 50 47 - - -   HDL >40 mg/dL 37(L) 37(L) - - -   Trlycerides <150 mg/dL 155(H) 86 - - -   PHART 7.350 - 7.450 - - 7.416 - -   PCO2ART 32.0 - 48.0 mmHg - - 32.2 - -   HCO3 20.0 - 28.0 mmol/L - - 20.7 21.9 -   TCO2 0 - 100 mmol/L - - _0 ACIDBASEDEF 0.0 - 2.0 mmol/L - -  3.0(H) 2.0 -   O2SAT % - - 90.0 67.0 -      Capillary Blood Glucose: No results found for: GLUCAP   Pulmonary Assessment Scores: Pulmonary Assessment Scores    Row Name 03/25/17 0700 03/31/17 0825       ADL UCSD   ADL Phase  Entry  Entry    SOB Score total  -  35      CAT Score   CAT Score  -  10 Entry      mMRC Score   mMRC Score  2  -       Pulmonary Function Assessment:   Exercise Target Goals:    Exercise Program Goal: Individual exercise prescription set with THRR, safety & activity barriers. Participant demonstrates ability to understand and report RPE using BORG scale, to self-measure pulse accurately, and to acknowledge the importance of the exercise prescription.  Exercise Prescription Goal: Starting with aerobic activity 30 plus minutes a day, 3 days per week for initial exercise prescription. Provide home  exercise prescription and guidelines that participant acknowledges understanding prior to discharge.  Activity Barriers & Risk Stratification:   6 Minute Walk: 6 Minute Walk    Row Name 03/23/17 1712         6 Minute Walk   Phase  Initial     Distance  853 feet     Walk Time  5.45 minutes     # of Rest Breaks  1     MPH  1.78     METS  1.43     RPE  11     Perceived Dyspnea   1     VO2 Peak  5.01     Symptoms  No     Resting HR  64 bpm     Resting BP  110/70     Max Ex. HR  73 bpm     Max Ex. BP  106/70     2 Minute Post BP  98/64       Interval HR   Baseline HR (retired)  92     1 Minute HR  71     2 Minute HR  68     3 Minute HR  54     4 Minute HR  60     5 Minute HR  73     6 Minute HR  71     2 Minute Post HR  55     Interval Heart Rate?  Yes       Interval Oxygen   Interval Oxygen?  Yes     Baseline Oxygen Saturation %  92 %     Resting Liters of Oxygen  2 L     1 Minute Oxygen Saturation %  85 % increased to 87      1 Minute Liters of Oxygen  2 L     2 Minute Oxygen Saturation %  86 % short rest break 15 seconds, incr. O2-->91%     2 Minute Liters of Oxygen  4 L     3 Minute Oxygen Saturation %  92 %     3 Minute Liters of Oxygen  6 L     4 Minute Oxygen Saturation %  91 %     4 Minute Liters of Oxygen  6 L     5 Minute Oxygen Saturation %  90 %     5 Minute Liters of Oxygen  6 L  6 Minute Oxygen Saturation %  90 %     6 Minute Liters of Oxygen  6 L     2 Minute Post Oxygen Saturation %  96 %     2 Minute Post Liters of Oxygen  3 L        Oxygen Initial Assessment: Oxygen Initial Assessment - 03/25/17 0700      Initial 6 min Walk   Oxygen Used  Continuous;E-Tanks    Liters per minute  6    Resting Oxygen Saturation   92 % 2liters    Exercise Oxygen Saturation  during 6 min walk  85 % 85-87% 2 liters 90% on 6 liters      Program Oxygen Prescription   Program Oxygen Prescription  Continuous;E-Tanks    Liters per minute  6       Oxygen  Re-Evaluation: Oxygen Re-Evaluation    Row Name 04/28/17 1721 05/21/17 1209 06/20/17 2139 07/12/17 1559 08/03/17 0747     Program Oxygen Prescription   Program Oxygen Prescription  Continuous;E-Tanks  Continuous;E-Tanks  Continuous;E-Tanks  Continuous;E-Tanks  Continuous;E-Tanks   Liters per minute  _0 Comments  order recieved to maintain saturations>85%  order recieved to maintain saturations>85%  order recieved to maintain saturations>85%. utilizing a non-rebreather to eliminate nasal drying from pendant  order recieved to maintain saturations>85%. utilizing a non-rebreather to eliminate nasal drying from pendant  order recieved to maintain saturations>85%. utilizing a non-rebreather to eliminate nasal drying from pendant     Home Oxygen   Home Oxygen Device  E-Tanks;Home Concentrator  E-Tanks;Home Concentrator  E-Tanks;Home Concentrator  E-Tanks;Home Concentrator  E-Tanks;Home Concentrator   Sleep Oxygen Prescription  Continuous  Continuous  Continuous  Continuous  Continuous   Liters per minute  _1 Home Exercise Oxygen Prescription  Continuous  Continuous  Continuous  Continuous  Continuous   Liters per minute  - home exercise not complete until it is determined that patient has oxygen source sufficient to maintain high literflow  -  8 8 liters per MD at this time  8 per MD order  8   Home at Rest Exercise Oxygen Prescription  Continuous  Continuous  Continuous  Continuous  Continuous   Liters per minute  2.5  2  -  2  2   Compliance with Home Oxygen Use  Yes  Yes  Yes  Yes  Yes     Goals/Expected Outcomes   Short Term Goals  To learn and exhibit compliance with exercise, home and travel O2 prescription;To learn and understand importance of monitoring SPO2 with pulse oximeter and demonstrate accurate use of the pulse oximeter.;To learn and understand importance of maintaining oxygen saturations>88%;To learn and demonstrate proper pursed lip breathing techniques  or other breathing techniques.;To learn and demonstrate proper use of respiratory medications  To learn and exhibit compliance with exercise, home and travel O2 prescription;To learn and understand importance of monitoring SPO2 with pulse oximeter and demonstrate accurate use of the pulse oximeter.;To learn and understand importance of maintaining oxygen saturations>88%;To learn and demonstrate proper pursed lip breathing techniques or other breathing techniques.;To learn and demonstrate proper use of respiratory medications  To learn and exhibit compliance with exercise, home and travel O2 prescription;To learn and understand importance of monitoring SPO2 with pulse oximeter and demonstrate accurate use of the pulse oximeter.;To learn and understand importance of maintaining oxygen saturations>88%;To learn and demonstrate  proper pursed lip breathing techniques or other breathing techniques.;To learn and demonstrate proper use of respiratory medications  To learn and exhibit compliance with exercise, home and travel O2 prescription;To learn and understand importance of monitoring SPO2 with pulse oximeter and demonstrate accurate use of the pulse oximeter.;To learn and understand importance of maintaining oxygen saturations>88%;To learn and demonstrate proper pursed lip breathing techniques or other breathing techniques.;To learn and demonstrate proper use of respiratory medications  To learn and exhibit compliance with exercise, home and travel O2 prescription;To learn and understand importance of monitoring SPO2 with pulse oximeter and demonstrate accurate use of the pulse oximeter.;To learn and understand importance of maintaining oxygen saturations>88%;To learn and demonstrate proper pursed lip breathing techniques or other breathing techniques.;To learn and demonstrate proper use of respiratory medications   Long  Term Goals  Exhibits compliance with exercise, home and travel O2 prescription;Verbalizes  importance of monitoring SPO2 with pulse oximeter and return demonstration;Maintenance of O2 saturations>88%;Exhibits proper breathing techniques, such as pursed lip breathing or other method taught during program session;Compliance with respiratory medication  Exhibits compliance with exercise, home and travel O2 prescription;Verbalizes importance of monitoring SPO2 with pulse oximeter and return demonstration;Maintenance of O2 saturations>88%;Exhibits proper breathing techniques, such as pursed lip breathing or other method taught during program session;Compliance with respiratory medication  Exhibits compliance with exercise, home and travel O2 prescription;Verbalizes importance of monitoring SPO2 with pulse oximeter and return demonstration;Maintenance of O2 saturations>88%;Exhibits proper breathing techniques, such as pursed lip breathing or other method taught during program session;Compliance with respiratory medication  Exhibits compliance with exercise, home and travel O2 prescription;Verbalizes importance of monitoring SPO2 with pulse oximeter and return demonstration;Maintenance of O2 saturations>88%;Exhibits proper breathing techniques, such as pursed lip breathing or other method taught during program session;Compliance with respiratory medication  Exhibits compliance with exercise, home and travel O2 prescription;Verbalizes importance of monitoring SPO2 with pulse oximeter and return demonstration;Maintenance of O2 saturations>88%;Exhibits proper breathing techniques, such as pursed lip breathing or other method taught during program session;Compliance with respiratory medication   Comments  -  patient has appointment with pulmonologist Sept 26 to discuss increased need for oxygen  patient met with MD to discuss oxygen needs. Patient was referred to [facility] transplant clinic and was declined  patient has had second opinion at [facility] with pulmonologist and has been referred to a pulmonary vascular  doctor for second opinion and treatment options for pulmonary hypertension  patient awaiting appointment on 12/6 with pulmonary vascular MD at [facility] to see if they have oxygen suggestions   Goals/Expected Outcomes  patient will verbalize home oxygen compliance  patient will verbalize home oxygen compliance  patient will verbalize home oxygen compliance  patient will verbalize home oxygen compliance  patient will verbalize home oxygen compliance      Oxygen Discharge (Final Oxygen Re-Evaluation): Oxygen Re-Evaluation - 08/03/17 0747      Program Oxygen Prescription   Program Oxygen Prescription  Continuous;E-Tanks    Liters per minute  25    Comments  order recieved to maintain saturations>85%. utilizing a non-rebreather to eliminate nasal drying from pendant      Home Oxygen   Home Oxygen Device  E-Tanks;Home Concentrator    Sleep Oxygen Prescription  Continuous    Liters per minute  2    Home Exercise Oxygen Prescription  Continuous    Liters per minute  8    Home at Rest Exercise Oxygen Prescription  Continuous    Liters per minute  2    Compliance  with Home Oxygen Use  Yes      Goals/Expected Outcomes   Short Term Goals  To learn and exhibit compliance with exercise, home and travel O2 prescription;To learn and understand importance of monitoring SPO2 with pulse oximeter and demonstrate accurate use of the pulse oximeter.;To learn and understand importance of maintaining oxygen saturations>88%;To learn and demonstrate proper pursed lip breathing techniques or other breathing techniques.;To learn and demonstrate proper use of respiratory medications    Long  Term Goals  Exhibits compliance with exercise, home and travel O2 prescription;Verbalizes importance of monitoring SPO2 with pulse oximeter and return demonstration;Maintenance of O2 saturations>88%;Exhibits proper breathing techniques, such as pursed lip breathing or other method taught during program session;Compliance with  respiratory medication    Comments  patient awaiting appointment on 12/6 with pulmonary vascular MD at [facility] to see if they have oxygen suggestions    Goals/Expected Outcomes  patient will verbalize home oxygen compliance       Initial Exercise Prescription: Initial Exercise Prescription - 03/25/17 0700      Date of Initial Exercise RX and Referring Provider   Date  03/25/17    Referring Provider  Dr. Haroldine Laws      Oxygen   Oxygen  Continuous    Liters  6      Bike   Level  0.5    Minutes  17      NuStep   Level  2    Minutes  17    METs  1.5      Track   Laps  7    Minutes  17      Prescription Details   Frequency (times per week)  2    Duration  Progress to 45 minutes of aerobic exercise without signs/symptoms of physical distress      Intensity   THRR 40-80% of Max Heartrate  60-119    Ratings of Perceived Exertion  11-13    Perceived Dyspnea  0-4      Progression   Progression  Continue progressive overload as per policy without signs/symptoms or physical distress.      Resistance Training   Training Prescription  Yes    Weight  orange bands    Reps  10-15       Perform Capillary Blood Glucose checks as needed.  Exercise Prescription Changes: Exercise Prescription Changes    Row Name 03/30/17 1200 04/01/17 1359 04/06/17 1200 04/08/17 1200 04/13/17 1200     Response to Exercise   Blood Pressure (Admit)  118/70  100/64  92/52  112/66  100/58   Blood Pressure (Exercise)  122/70  -  132/62  120/58  144/60   Blood Pressure (Exit)  96/62  114/62  98/64  105/69  104/60   Heart Rate (Admit)  59 bpm  55 bpm  66 bpm  60 bpm  59 bpm   Heart Rate (Exercise)  86 bpm  88 bpm  92 bpm  85 bpm  87 bpm   Heart Rate (Exit)  62 bpm  53 bpm  59 bpm  61 bpm  60 bpm   Oxygen Saturation (Admit)  98 %  98 %  96 %  92 %  96 %   Oxygen Saturation (Exercise)  84 %  84 %  87 %  86 %  85 % with forehead probe   Oxygen Saturation (Exit)  94 %  94 %  93 %  93 %  99 %   Rating of  Perceived Exertion (Exercise)  _0 Perceived Dyspnea (Exercise)  _1 0   Duration  Continue with 45 min of aerobic exercise without signs/symptoms of physical distress.  Continue with 45 min of aerobic exercise without signs/symptoms of physical distress.  Continue with 45 min of aerobic exercise without signs/symptoms of physical distress.  Continue with 45 min of aerobic exercise without signs/symptoms of physical distress.  Continue with 45 min of aerobic exercise without signs/symptoms of physical distress.   Intensity  THRR unchanged  THRR unchanged  THRR unchanged  THRR unchanged  THRR unchanged     Progression   Progression  Continue to progress workloads to maintain intensity without signs/symptoms of physical distress.  Continue to progress workloads to maintain intensity without signs/symptoms of physical distress.  Continue to progress workloads to maintain intensity without signs/symptoms of physical distress.  Continue to progress workloads to maintain intensity without signs/symptoms of physical distress.  Continue to progress workloads to maintain intensity without signs/symptoms of physical distress.     Resistance Training   Training Prescription  Yes  Yes  Yes  Yes  Yes   Weight  orange bands  orange bands  orange bands  orange bands  orange bands   Reps  10-15  10-15  10-15  10-15  10-15   Time  10 Minutes  10 Minutes  10 Minutes  10 Minutes  10 Minutes     Oxygen   Oxygen  Continuous  Continuous  Continuous  Continuous  Continuous   Liters  _2 8-10  8-10     Treadmill   MPH  1.6  2  2.3  -  2.2   Grade  0  0  2  -  0   Minutes  _3 -  17     Bike   Level  0.5  -  0.6  0.6  0.8   Minutes  17  -  _4 NuStep   Level  2  -  _5 Minutes  17  -  _6 METs  1.7  -  1.8  1.9  1.7   Row Name 04/15/17 1230 04/20/17 1200 04/22/17 1245 04/27/17 1231 04/29/17 1248     Response to Exercise   Blood Pressure (Admit)   100/66  100/60  100/60  98/62  86/62 reck 110/58   Blood Pressure (Exercise)  116/62  106/60  104/81  144/68  166/72   Blood Pressure (Exit)  98/50  104/70  112/60  100/66  108/64   Heart Rate (Admit)  60 bpm  61 bpm  57 bpm  55 bpm  64 bpm   Heart Rate (Exercise)  82 bpm  89 bpm  89 bpm  111 bpm  101 bpm   Heart Rate (Exit)  58 bpm  63 bpm  63 bpm  68 bpm  66 bpm   Oxygen Saturation (Admit)  97 %  99 %  98 %  98 %  96 % 2 liters   Oxygen Saturation (Exercise)  87 % with forehead probe  76 % Sat on TM down O2 on 6L increased to 10L. Oxymizer placed.  83 % Sat on TM down O2 on 6L increased to 10L. Oxymizer placed.  86 %  15 L  84 % 10L INCREASED TO 15L   Oxygen Saturation (Exit)  95 %  95 %  95 %  92 %  96 % 2 liters   Rating of Perceived Exertion (Exercise)  _0 Perceived Dyspnea (Exercise)  0  _1 Duration  Continue with 45 min of aerobic exercise without signs/symptoms of physical distress.  Continue with 45 min of aerobic exercise without signs/symptoms of physical distress.  Continue with 45 min of aerobic exercise without signs/symptoms of physical distress.  Continue with 45 min of aerobic exercise without signs/symptoms of physical distress.  Continue with 45 min of aerobic exercise without signs/symptoms of physical distress.   Intensity  THRR unchanged  THRR unchanged  THRR unchanged  THRR unchanged  THRR unchanged     Progression   Progression  Continue to progress workloads to maintain intensity without signs/symptoms of physical distress.  Continue to progress workloads to maintain intensity without signs/symptoms of physical distress.  Continue to progress workloads to maintain intensity without signs/symptoms of physical distress.  Continue to progress workloads to maintain intensity without signs/symptoms of physical distress.  Continue to progress workloads to maintain intensity without signs/symptoms of physical distress.     Resistance Training   Training  Prescription  Yes  Yes  Yes  Yes  Yes   Weight  orange bands  orange bands  orange bands  orange bands  orange bands   Reps  10-15  10-15  10-15  10-15  10-15   Time  10 Minutes  10 Minutes  10 Minutes  10 Minutes  10 Minutes     Oxygen   Oxygen  Continuous  Continuous  Continuous  Continuous  Continuous   Liters  8-10  8-10  8-_2 Treadmill   MPH  2.2  2.1  2.1  2.4  -   Grade  0  0  0  3  -   Minutes  _3 -     Bike   Level  0.8  0.8  1  1.4  1 decreased workload per patients request. still desatuates   Minutes  _4 NuStep   Level  3  4  -  4  4   Minutes  17  17  -  17  17   METs  1.7  1.8  -  2.8  2.8   Row Name 05/04/17 1200 05/06/17 1200 05/11/17 1200 05/13/17 1209 05/18/17 1600     Response to Exercise   Blood Pressure (Admit)  90/52  90/50  96/62  90/58  116/60   Blood Pressure (Exercise)  -  110/62  120/64  142/62  152/72   Blood Pressure (Exit)  100/50  98/58  106/60  116/68  120/60   Heart Rate (Admit)  62 bpm  59 bpm  67 bpm  68 bpm  61 bpm   Heart Rate (Exercise)  103 bpm  101 bpm  92 bpm  106 bpm  102 bpm   Heart Rate (Exit)  66 bpm  69 bpm  65 bpm  76 bpm  72 bpm   Oxygen Saturation (Admit)  99 %  93 %  92 %  90 %  99 %   Oxygen Saturation (Exercise)  81 % O2 increased to 15 L sat up to 88  83 % O2 increased to 15L sat up to 88%  84 % O2 increased to 15L sat up to 88%  74 % o2 increased to 25L and patient pulled off [employer] bike  85 %   Oxygen Saturation (Exit)  95 %  97 %  96 %  92 %  96 %   Rating of Perceived Exertion (Exercise)  _0 Perceived Dyspnea (Exercise)  _1 Duration  Continue with 45 min of aerobic exercise without signs/symptoms of physical distress.  Continue with 45 min of aerobic exercise without signs/symptoms of physical distress.  Continue with 45 min of aerobic exercise without signs/symptoms of physical distress.  Continue with 45 min of aerobic exercise without  signs/symptoms of physical distress.  Continue with 45 min of aerobic exercise without signs/symptoms of physical distress.   Intensity  THRR unchanged  THRR unchanged  THRR unchanged  THRR unchanged  THRR unchanged     Progression   Progression  Continue to progress workloads to maintain intensity without signs/symptoms of physical distress.  Continue to progress workloads to maintain intensity without signs/symptoms of physical distress.  Continue to progress workloads to maintain intensity without signs/symptoms of physical distress.  Continue to progress workloads to maintain intensity without signs/symptoms of physical distress.  Continue to progress workloads to maintain intensity without signs/symptoms of physical distress.     Resistance Training   Training Prescription  Yes  Yes  Yes  Yes  Yes   Weight  orange bands  orange bands  orange bands  orange bands  orange bands   Reps  10-15  10-15  10-15  10-15  10-15   Time  10 Minutes  10 Minutes  10 Minutes  10 Minutes  10 Minutes     Oxygen   Oxygen  Continuous  Continuous  Continuous  Continuous  Continuous   Liters  _2 Treadmill   MPH  2.4  2.4  2.4  2.4  2.4   Grade  0  0  _3 Minutes  _4 Bike   Level  1.4  1.4  -  1.4  1.4   Minutes  17  17  -  -  -     NuStep   Level  4  -  _5 Minutes  17  -  _6 METs  -  -  2.5  2.7  2.9   Row Name 05/20/17 1200 05/25/17 1226 05/27/17 1200 06/01/17 1200 06/03/17 1200     Response to Exercise   Blood Pressure (Admit)  104/60  108/64  116/64  112/62  110/70   Blood Pressure (Exercise)  134/66  158/70  130/68  128/70  120/72   Blood Pressure (Exit)  106/60  106/60  118/70  114/72  112/70   Heart Rate (Admit)  76 bpm  65 bpm  62 bpm  70 bpm  63 bpm   Heart Rate (Exercise)  108 bpm  112 bpm  109 bpm  98 bpm  103 bpm   Heart Rate (Exit)  65 bpm  79 bpm  75 bpm  67 bpm  65 bpm   Oxygen Saturation (Admit)  96 %  95 %  94 %  96 %   100 %   Oxygen Saturation (Exercise)  86 % 15 liters moved to 25liters  85 %  86 %  95 %  93 %   Oxygen Saturation (Exit)  90 %  89 %  92 %  92 %  98 %   Rating of Perceived Exertion (Exercise)  _0 Perceived Dyspnea (Exercise)  _1 Duration  Continue with 45 min of aerobic exercise without signs/symptoms of physical distress.  Continue with 45 min of aerobic exercise without signs/symptoms of physical distress.  Continue with 45 min of aerobic exercise without signs/symptoms of physical distress.  Continue with 45 min of aerobic exercise without signs/symptoms of physical distress.  Continue with 45 min of aerobic exercise without signs/symptoms of physical distress.   Intensity  THRR unchanged  THRR unchanged  THRR unchanged  THRR unchanged  THRR unchanged     Progression   Progression  Continue to progress workloads to maintain intensity without signs/symptoms of physical distress.  Continue to progress workloads to maintain intensity without signs/symptoms of physical distress.  Continue to progress workloads to maintain intensity without signs/symptoms of physical distress.  Continue to progress workloads to maintain intensity without signs/symptoms of physical distress.  Continue to progress workloads to maintain intensity without signs/symptoms of physical distress.     Resistance Training   Training Prescription  Yes  Yes  Yes  Yes  Yes   Weight  orange bands  orange bands  orange bands  orange bands  orange bands   Reps  10-15  10-15  10-15  10-15  10-15   Time  10 Minutes  10 Minutes  10 Minutes  10 Minutes  10 Minutes     Oxygen   Oxygen  Continuous  Continuous  -  Continuous  Continuous   Liters  25  25  -  25  25     Treadmill   MPH  -  2.4  -  2.4  2.4   Grade  -  3  -  3  3   Minutes  -  17  -  17  34     Bike   Level  1.4  1.4  1.1  1  -   Minutes  _2 -     NuStep   Level  5  5  2.4  5  -   Minutes  _3 -   METs   2.5  2.8  3  2.1  -   Row Name 06/08/17 1200 06/10/17 1200 06/15/17 1200 06/17/17 1254 06/24/17 1200     Response to Exercise   Blood Pressure (Admit)  94/56  96/62  90/44  102/58  96/50   Blood Pressure (Exercise)  130/80  148/68  122/64  146/80  138/66   Blood Pressure (Exit)  100/60  114/66  112/68  114/70  92/60   Heart Rate (Admit)  65 bpm  61 bpm  78 bpm  54 bpm  52 bpm   Heart Rate (Exercise)  107 bpm  115 bpm  100 bpm  104 bpm  102 bpm   Heart Rate (Exit)  77 bpm  72 bpm  60 bpm  69 bpm  88 bpm   Oxygen Saturation (Admit)  94 %  93 %  93 %  92 %  97 %   Oxygen Saturation (Exercise)  93 %  95 %  85 %  98 %  95 %   Oxygen Saturation (Exit)  89 %  97 %  93 %  96 %  96 %   Rating of Perceived Exertion (Exercise)  _0 Perceived Dyspnea (Exercise)  2  0  _1 Duration  Continue with 45 min of aerobic exercise without signs/symptoms of physical distress.  Continue with 45 min of aerobic exercise without signs/symptoms of physical distress.  Continue with 45 min of aerobic exercise without signs/symptoms of physical distress.  Continue with 45 min of aerobic exercise without signs/symptoms of physical distress.  Continue with 45 min of aerobic exercise without signs/symptoms of physical distress.   Intensity  THRR unchanged  THRR unchanged  THRR unchanged  THRR unchanged  THRR unchanged     Progression   Progression  Continue to progress workloads to maintain intensity without signs/symptoms of physical distress.  Continue to progress workloads to maintain intensity without signs/symptoms of physical distress.  Continue to progress workloads to maintain intensity without signs/symptoms of physical distress.  Continue to progress workloads to maintain intensity without signs/symptoms of physical distress.  Continue to progress workloads to maintain intensity without signs/symptoms of physical distress.     Resistance Training   Training Prescription  Yes  Yes  Yes  Yes  Yes    Weight  orange bands  orange bands  orange bands  orange bands  orange bands   Reps  10-15  10-15  10-15  10-15  10-15   Time  10 Minutes  10 Minutes  10 Minutes  10 Minutes  10 Minutes     Oxygen   Oxygen  Continuous  Continuous  Continuous  Continuous  Continuous   Liters  _2 Treadmill   MPH  2  2.9  2.2 patient not feeling well today   3  3   Grade  _3 Minutes  34  34  34  34  17     Bike   Level  -  -  1.4  -  1.4   Minutes  -  -  17  -  17     NuStep   Level  -  5  -  -  -   Minutes  -  17  -  -  -   METs  -  2.1  -  -  -   Row Name 06/29/17 1213 07/01/17 1239 07/06/17 1200 07/08/17 1316 07/13/17 1200     Response to Exercise   Blood Pressure (Admit)  98/66  100/50  100/54  106/66  100/56   Blood Pressure (Exercise)  110/60  -  140/70  132/80  114/70   Blood Pressure (Exit)  108/62  104/64  98/56  118/72  96/58   Heart Rate (Admit)  54 bpm  54 bpm  61 bpm  60 bpm  66 bpm   Heart Rate (Exercise)  108 bpm  108 bpm  107 bpm  108 bpm  102 bpm   Heart Rate (Exit)  60 bpm  70 bpm  67 bpm  70 bpm  79 bpm   Oxygen Saturation (Admit)  97 %  96 %  94 %  91 %  96 %   Oxygen Saturation (Exercise)  92 %  90 %  89 %  95 %  90 %   Oxygen Saturation (Exit)  94 %  97 %  93 %  94 %  91 %   Rating of Perceived Exertion (Exercise)  _0 Perceived Dyspnea (Exercise)  _1 Duration  Continue with 45 min of aerobic exercise without signs/symptoms of physical distress.  Continue with 45 min of aerobic exercise without signs/symptoms of physical distress.  Continue with 45 min of aerobic exercise without signs/symptoms of physical distress.  Continue with 45 min of aerobic exercise without signs/symptoms of physical distress.  Continue with 45 min of aerobic exercise without signs/symptoms of physical distress.   Intensity  THRR unchanged  THRR unchanged  THRR unchanged  THRR unchanged  THRR unchanged     Progression   Progression   Continue to progress workloads to maintain intensity without signs/symptoms of physical distress.  Continue to progress workloads to maintain intensity without signs/symptoms of physical distress.  Continue to progress workloads to maintain intensity without signs/symptoms of physical distress.  Continue to progress workloads to maintain intensity without signs/symptoms of physical distress.  Continue to progress workloads to maintain intensity without signs/symptoms of physical distress.     Resistance Training   Training Prescription  Yes  Yes  Yes  Yes  Yes   Weight  orange bands  orange bands  orange bands  orange bands  orange bands   Reps  10-15  10-15  10-15  10-15  10-15   Time  10 Minutes  10 Minutes  10 Minutes  10 Minutes  10 Minutes     Oxygen   Oxygen  Continuous  Continuous  Continuous  Continuous  Continuous   Liters  _2 Treadmill   MPH  _3 Grade  _4 Minutes  34  34  34  34  34     Bike   Level  -  -  1.4  -  1.5   Minutes  -  -  17  -  17     NuStep   Level  5  -  -  -  -   Minutes  17  -  -  -  -   METs  2.2  -  -  -  -   Row Name 07/15/17 1200 07/20/17 1200 07/27/17 1200 07/29/17 1300 08/03/17 1200     Response to Exercise   Blood Pressure (Admit)  98/60  100/54  114/60  100/50  102/56   Blood Pressure (Exercise)  140/80  130/60  108/66  138/64  156/76   Blood Pressure (Exit)  110/60  102/60  104/60  110/70  114/60   Heart Rate (Admit)  54 bpm  56 bpm  71 bpm  57 bpm  60 bpm   Heart Rate (Exercise)  102 bpm  103 bpm  112 bpm  110 bpm  110 bpm   Heart Rate (Exit)  65 bpm  76 bpm  80 bpm  68 bpm  71 bpm   Oxygen Saturation (Admit)  97 %  97 %  97 %  92 %  94 %   Oxygen Saturation (Exercise)  97 %  89 %  89 %  92 %  88 %   Oxygen Saturation (Exit)  96 %  93 %  96 %  94 %  94 %   Rating of Perceived Exertion (Exercise)  _0 Perceived Dyspnea (Exercise)  _1 Duration  Continue with 45 min of  aerobic exercise without signs/symptoms of physical distress.  Continue with 45 min of aerobic exercise without signs/symptoms of physical distress.  Continue with 45 min of aerobic exercise without signs/symptoms of physical distress.  Continue with 45 min of aerobic exercise without signs/symptoms of physical distress.  Continue with 45 min of aerobic exercise without signs/symptoms of physical distress.   Intensity  THRR unchanged  THRR unchanged  THRR unchanged  THRR unchanged  THRR unchanged     Progression   Progression  Continue to progress workloads to maintain intensity without signs/symptoms of physical distress.  Continue to progress workloads to maintain intensity without signs/symptoms of physical distress.  Continue to progress workloads to maintain intensity without signs/symptoms of physical distress.  Continue to progress workloads to maintain intensity without signs/symptoms of physical distress.  Continue to progress workloads to maintain intensity without signs/symptoms of physical distress.     Resistance Training   Training Prescription  Yes  Yes  Yes  Yes  Yes   Weight  orange bands  orange bands  orange bands  orange bands  orange bands   Reps  10-15  10-15  10-15  10-15  10-15   Time  10 Minutes  10 Minutes  10 Minutes  10 Minutes  10 Minutes     Oxygen   Oxygen  Continuous  Continuous  Continuous  Continuous  Continuous   Liters  _2 Treadmill   MPH  3  3  3.2  3.2  3.2   Grade  _3 Minutes  34  34  34  34  34     Bike   Level  -  1.4  1.4  -  1.4   Minutes  -  17  17  -  17      Exercise Comments:   Exercise Goals and Review: Exercise Goals    Row Name 03/19/17 1125             Exercise Goals   Increase Physical Activity  Yes       Intervention  Provide advice, education, support and counseling about physical activity/exercise needs.;Develop an individualized exercise prescription for aerobic and resistive training  based on initial evaluation findings, risk stratification, comorbidities and participant's personal goals.       Expected Outcomes  Achievement of increased cardiorespiratory fitness and enhanced flexibility, muscular endurance and strength shown through measurements of functional capacity and personal statement of participant.       Increase Strength and Stamina  Yes       Intervention  Provide advice, education, support and counseling about physical activity/exercise needs.;Develop an individualized exercise prescription for aerobic and resistive training based on initial evaluation findings, risk stratification, comorbidities and participant's personal  goals.       Expected Outcomes  Achievement of increased cardiorespiratory fitness and enhanced flexibility, muscular endurance and strength shown through measurements of functional capacity and personal statement of participant.          Exercise Goals Re-Evaluation : Exercise Goals Re-Evaluation    Row Name 03/26/17 1527 04/27/17 0814 05/17/17 1151 06/21/17 1006 07/15/17 1551     Exercise Goal Re-Evaluation   Exercise Goals Review  Increase Strenth and Stamina;Increase Physical Activity  Increase Physical Activity;Increase Strength and Stamina;Able to understand and use Dyspnea scale;Able to understand and use rate of perceived exertion (RPE) scale;Knowledge and understanding of Target Heart Rate Range (THRR);Understanding of Exercise Prescription  Increase Physical Activity;Increase Strength and Stamina;Able to understand and use Dyspnea scale;Knowledge and understanding of Target Heart Rate Range (THRR);Able to understand and use rate of perceived exertion (RPE) scale;Understanding of Exercise Prescription  Increase Strength and Stamina;Increase Physical Activity;Able to understand and use Dyspnea scale;Able to understand and use rate of perceived exertion (RPE) scale;Knowledge and understanding of Target Heart Rate Range (THRR);Understanding of  Exercise Prescription  Increase Strength and Stamina;Increase Physical Activity;Able to understand and use Dyspnea scale;Able to understand and use rate of perceived exertion (RPE) scale;Knowledge and understanding of Target Heart Rate Range (THRR);Understanding of Exercise Prescription   Comments  Patient has only completed his 51mt. Will cont. to monitor and progress as able.   Patient has been desatruating at low workloads. Just receieved order from Dr. to increase liter flow to maintain oxygen saturation above 85%. Will now be able to increase workloads.   Patient has been increased to 25 liters during certain exercises. This causes him mental stress. Will cont. to monitor and progress as able on higher flows.    Patient is exercising consistently at his threshold. 25 liters most of the time. Open to workload changes. Will cont. to monitor and progress as able.   Patient is very motivated to work hard. His attendance is consistent. He is building his walking endurance on the treadmill. Working at his higher threshold. On 25 liters he is able to push himself and make noticeable gains. He is not exercising at home because he doesnt have 25 liters. Will cont. to monitor and motivate.    Expected Outcomes  Through the exercise at rehab and at home, patient will increase physical activity, strength, and stamina.   Through the exercise at rehab and at home, patient will increase physical activity, strength, and stamina.   Through the exercise at rehab and at home, patient will increase physical activity, strength, and stamina.   Through the exercise at rehab and at home, patient will increase physical activity, strength, and stamina.   Through exercise at rehab and at home, patient will increase strength and stamina making ADL's easier to perform. Patient will also have a better understanding of safe exercise and what they are capable to do outside of clinical supervision.   RLong BeachName 08/03/17 0731              Exercise Goal Re-Evaluation   Exercise Goals Review  Understanding of Exercise Prescription;Knowledge and understanding of Target Heart Rate Range (THRR);Able to understand and use rate of perceived exertion (RPE) scale;Increase Physical Activity;Increase Strength and Stamina;Able to understand and use Dyspnea scale       Comments  Patient is very motivated to work hard. His attendance is consistent. He is building his walking endurance on the treadmill. Working at his higher threshold. On 25 liters he is able  to push himself and make noticeable gains. He is not exercising at home because he doesnt have 25 liters. 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 Will cont. to monitor and motivate.        Expected Outcomes  Through exercise at rehab and at home, patient will increase strength and stamina making ADL's easier to perform. Patient will also have a better understanding of safe exercise and what they are capable to do outside of clinical supervision.          Discharge Exercise Prescription (Final Exercise Prescription Changes): Exercise Prescription Changes - 08/03/17 1200      Response to Exercise   Blood Pressure (Admit)  102/56    Blood Pressure (Exercise)  156/76    Blood Pressure (Exit)  114/60    Heart Rate (Admit)  60 bpm    Heart Rate (Exercise)  110 bpm    Heart Rate (Exit)  71 bpm    Oxygen Saturation (Admit)  94 %    Oxygen Saturation (Exercise)  88 %    Oxygen Saturation (Exit)  94 %    Rating of Perceived Exertion (Exercise)  14    Perceived Dyspnea (Exercise)  2    Duration  Continue with 45 min of aerobic exercise without signs/symptoms of physical distress.    Intensity  THRR unchanged      Progression   Progression  Continue to progress workloads to maintain intensity without signs/symptoms of physical distress.      Resistance Training   Training Prescription  Yes    Weight  orange bands    Reps  10-15    Time  10 Minutes      Oxygen    Oxygen  Continuous    Liters  25      Treadmill   MPH  3.2    Grade  6    Minutes  34      Bike   Level  1.4    Minutes  17       Nutrition:  Target Goals: Understanding of nutrition guidelines, daily intake of sodium <1542m, cholesterol <2058m calories 30% from fat and 7% or less from saturated fats, daily to have 5 or more servings of fruits and vegetables.  Biometrics: Pre Biometrics - 03/25/17 0700      Pre Biometrics   Grip Strength  36 kg        Nutrition Therapy Plan and Nutrition Goals: Nutrition Therapy & Goals - 04/29/17 1356      Nutrition Therapy   Diet  TLC      Personal Nutrition Goals   Nutrition Goal  Describe the benefit of including fruits, vegetables, whole grains, and low-fat dairy products in a healthy meal plan.    Personal Goal #2  Encourage wt gain to a minimum wt gain goal of 180 lb and long-term wt gain goal of ~195 lb.       Intervention Plan   Intervention  Prescribe, educate and counsel regarding individualized specific dietary modifications aiming towards targeted core components such as weight, hypertension, lipid management, diabetes, heart failure and other comorbidities.;Nutrition handout(s) given to patient. Handouts given for High Calorie, High Protein diet, recipes, and Suggestions for increasing calorie and protein intake    Expected Outcomes  Short Term Goal: Understand basic principles of dietary content, such as calories, fat, sodium, cholesterol and nutrients.;Long Term Goal: Adherence to prescribed nutrition plan.       Nutrition Discharge: Rate Your  Plate Scores: Nutrition Assessments - 04/29/17 1357      Rate Your Plate Scores   Pre Score  64       Nutrition Goals Re-Evaluation:   Nutrition Goals Discharge (Final Nutrition Goals Re-Evaluation):   Psychosocial: Target Goals: Acknowledge presence or absence of significant depression and/or stress, maximize coping skills, provide positive support system.  Participant is able to verbalize types and ability to use techniques and skills needed for reducing stress and depression.  Initial Review & Psychosocial Screening: Initial Psych Review & Screening - 03/19/17 1149      Initial Review   Current issues with  Current Depression      Family Dynamics   Good Support System?  Yes      Barriers   Psychosocial barriers to participate in program  Psychosocial barriers identified (see note);The patient should benefit from training in stress management and relaxation.      Screening Interventions   Interventions  Yes;To provide support and resources with identified psychosocial needs;Provide feedback about the scores to participant;Program counselor consult;Encouraged to exercise    Expected Outcomes  Short Term goal: Utilizing psychosocial counselor, staff and physician to assist with identification of specific Stressors or current issues interfering with healing process. Setting desired goal for each stressor or current issue identified.       Quality of Life Scores:   PHQ-9: Recent Review Flowsheet Data    Depression screen [facility] 2/9 03/19/2017   Decreased Interest 0   Down, Depressed, Hopeless 2   PHQ - 2 Score 2   Altered sleeping 0   Tired, decreased energy 1   Change in appetite 0   Feeling bad or failure about yourself  2   Trouble concentrating 0   Moving slowly or fidgety/restless 0   Suicidal thoughts 0   PHQ-9 Score 5   Difficult doing work/chores Not difficult at all     Interpretation of Total Score  Total Score Depression Severity:  1-4 = Minimal depression, 5-9 = Mild depression, 10-14 = Moderate depression, 15-19 = Moderately severe depression, 20-27 = Severe depression   Psychosocial Evaluation and Intervention: Psychosocial Evaluation - 03/19/17 1159      Psychosocial Evaluation & Interventions   Comments  referral to Jeanella Craze  (Pended)     Continue Psychosocial Services   Follow up required by staff  Roni Bread)         Psychosocial Re-Evaluation: Psychosocial Re-Evaluation    Row Name 04/28/17 1728 05/21/17 1212 06/20/17 2145 07/12/17 1605 08/03/17 0755     Psychosocial Re-Evaluation   Current issues with  Current Stress Concerns;Current Depression;History of Depression;Current Anxiety/Panic;Current Sleep Concerns  Current Stress Concerns;Current Depression;History of Depression;Current Anxiety/Panic;Current Sleep Concerns  Current Depression;History of Depression;Current Sleep Concerns;Current Stress Concerns  Current Depression;History of Depression;Current Sleep Concerns;Current Stress Concerns  Current Depression;History of Depression;Current Sleep Concerns;Current Stress Concerns   Comments  patient has significant depression. it is affecting his participation in rehab. he has been referred to [facility] and sees him on a regular basis for counceling  patient has significant depression. it is affecting his participation in rehab. he has been referred to [facility] and sees him on a regular basis for counceling. He has been placed on an antidepressant by his pcp and seems to be happier and more positive in rehab  patient felt he was beginning to see a positive difference in his mood until he was declined for transplant. now he is not pushing himself as hard in the gym  and he is reluctant to workload increases. he continues to meet with a counselor weekly  patient has grieved the loss of a potential transplant and is most recently working harder than ever to maintain his level of fittness and to prevent further decline in his functional ability.  patient has grieved the loss of a potential transplant and is most recently working harder than ever to maintain his level of fittness and to prevent further decline in his functional ability.   Expected Outcomes  patient will have a reduction in his psychosocial barrier symptoms (a reduction in tearfulness while at rehab)  patient will have a reduction in his  psychosocial barrier symptoms (a reduction in tearfulness while at rehab)  patient will have a reduction in his psychosocial barrier symptoms (a reduction in tearfulness while at rehab)  patient will have a reduction in his psychosocial barrier symptoms (a reduction in tearfulness while at rehab)  patient will have a reduction in his psychosocial barrier symptoms (a reduction in tearfulness while at rehab)   Interventions  Physician referral;Therapist referral;Encouraged to attend Pulmonary Rehabilitation for the exercise  Physician referral;Therapist referral;Encouraged to attend Pulmonary Rehabilitation for the exercise  Physician referral;Therapist referral;Encouraged to attend Pulmonary Rehabilitation for the exercise  Physician referral;Therapist referral;Encouraged to attend Pulmonary Rehabilitation for the exercise  Physician referral;Therapist referral;Encouraged to attend Pulmonary Rehabilitation for the exercise   Continue Psychosocial Services   Follow up required by staff  Follow up required by staff  Follow up required by staff  Follow up required by staff  Follow up required by staff     Initial Review   Source of Stress Concerns  Chronic Illness;Family;Unable to participate in former interests or hobbies;Unable to perform yard/household activities;Retirement/disability  Chronic Illness;Family;Unable to participate in former interests or hobbies;Unable to perform yard/household activities;Retirement/disability  Chronic Illness;Family;Unable to participate in former interests or hobbies;Unable to perform yard/household activities;Retirement/disability  Chronic Illness;Family;Unable to participate in former interests or hobbies;Unable to perform yard/household activities;Retirement/disability  Chronic Illness;Family;Unable to participate in former interests or hobbies;Unable to perform yard/household activities;Retirement/disability      Psychosocial Discharge (Final Psychosocial  Re-Evaluation): Psychosocial Re-Evaluation - 08/03/17 0755      Psychosocial Re-Evaluation   Current issues with  Current Depression;History of Depression;Current Sleep Concerns;Current Stress Concerns    Comments  patient has grieved the loss of a potential transplant and is most recently working harder than ever to maintain his level of fittness and to prevent further decline in his functional ability.    Expected Outcomes  patient will have a reduction in his psychosocial barrier symptoms (a reduction in tearfulness while at rehab)    Interventions  Physician referral;Therapist referral;Encouraged to attend Pulmonary Rehabilitation for the exercise    Continue Psychosocial Services   Follow up required by staff      Initial Review   Source of Stress Concerns  Chronic Illness;Family;Unable to participate in former interests or hobbies;Unable to perform yard/household activities;Retirement/disability       Education: Education Goals: Education classes will be provided on a weekly basis, covering required topics. Participant will state understanding/return demonstration of topics presented.  Learning Barriers/Preferences: Learning Barriers/Preferences - 03/19/17 1147      Learning Barriers/Preferences   Learning Barriers  None    Learning Preferences  Written Material;Skilled Demonstration;Individual Instruction;Group Instruction       Education Topics: Risk Factor Reduction:  -Group instruction that is supported by a PowerPoint presentation. Instructor discusses the definition of a risk factor, different risk factors for pulmonary disease,  and how the heart and lungs work together.     Nutrition for Pulmonary Patient:  -Group instruction provided by PowerPoint slides, verbal discussion, and written materials to support subject matter. The instructor gives an explanation and review of healthy diet recommendations, which includes a discussion on weight management, recommendations for  fruit and vegetable consumption, as well as protein, fluid, caffeine, fiber, sodium, sugar, and alcohol. Tips for eating when patients are short of breath are discussed.   PULMONARY REHAB OTHER RESPIRATORY from 07/29/2017 in [place]  Date  06/17/17  Educator  Nutritionist  Instruction Review Code  2- meets goals/outcomes      Pursed Lip Breathing:  -Group instruction that is supported by demonstration and informational handouts. Instructor discusses the benefits of pursed lip and diaphragmatic breathing and detailed demonstration on how to preform both.     PULMONARY REHAB OTHER RESPIRATORY from 07/29/2017 in [place]  Date  05/06/17  Educator  RT  Instruction Review Code  2- meets goals/outcomes      Oxygen Safety:  -Group instruction provided by PowerPoint, verbal discussion, and written material to support subject matter. There is an overview of "What is Oxygen" and "Why do we need it".  Instructor also reviews how to create a safe environment for oxygen use, the importance of using oxygen as prescribed, and the risks of noncompliance. There is a brief discussion on traveling with oxygen and resources the patient may utilize.   PULMONARY REHAB OTHER RESPIRATORY from 07/29/2017 in [place]  Date  04/08/17  Educator  Truddie Crumble  Instruction Review Code  2- meets goals/outcomes      Oxygen Equipment:  -Group instruction provided by [employer] Staff utilizing handouts, written materials, and equipment demonstrations.   PULMONARY REHAB OTHER RESPIRATORY from 07/29/2017 in [place]  Date  07/29/17  Educator  Ace Gins  Instruction Review Code  2- meets goals/outcomes      Signs and Symptoms:  -Group instruction provided by written material and verbal discussion to support subject matter. Warning signs and symptoms of infection, stroke, and heart attack  are reviewed and when to call the physician/911 reinforced. Tips for preventing the spread of infection discussed.   PULMONARY REHAB OTHER RESPIRATORY from 07/29/2017 in [place]  Date  05/20/17  Educator  RN  Instruction Review Code  2- meets goals/outcomes      Advanced Directives:  -Group instruction provided by verbal instruction and written material to support subject matter. Instructor reviews Advanced Directive laws and proper instruction for filling out document.   Pulmonary Video:  -Group video education that reviews the importance of medication and oxygen compliance, exercise, good nutrition, pulmonary hygiene, and pursed lip and diaphragmatic breathing for the pulmonary patient.   PULMONARY REHAB OTHER RESPIRATORY from 07/29/2017 in [place]  Date  05/27/17  Instruction Review Code  2- meets goals/outcomes      Exercise for the Pulmonary Patient:  -Group instruction that is supported by a PowerPoint presentation. Instructor discusses benefits of exercise, core components of exercise, frequency, duration, and intensity of an exercise routine, importance of utilizing pulse oximetry during exercise, safety while exercising, and options of places to exercise outside of rehab.     PULMONARY REHAB OTHER RESPIRATORY from 07/29/2017 in [place]  Date  07/15/17  Educator  Cloyde Reams  Instruction Review Code  2-  meets goals/outcomes      Pulmonary Medications:  -Verbally interactive group education provided by instructor with focus on inhaled medications and proper administration.   PULMONARY REHAB OTHER RESPIRATORY from 07/29/2017 in [place]  Date  07/01/17  Educator  pharmacist  Instruction Review Code  R- Review/reinforce      Anatomy and Physiology of the Respiratory System and Intimacy:  -Group instruction provided by PowerPoint, verbal  discussion, and written material to support subject matter. Instructor reviews respiratory cycle and anatomical components of the respiratory system and their functions. Instructor also reviews differences in obstructive and restrictive respiratory diseases with examples of each. Intimacy, Sex, and Sexuality differences are reviewed with a discussion on how relationships can change when diagnosed with pulmonary disease. Common sexual concerns are reviewed.   MD DAY -A group question and answer session with a medical doctor that allows participants to ask questions that relate to their pulmonary disease state.   PULMONARY REHAB OTHER RESPIRATORY from 07/29/2017 in [place]  Date  06/08/17  Educator  yacoub  Instruction Review Code  2- meets goals/outcomes      OTHER EDUCATION -Group or individual verbal, written, or video instructions that support the educational goals of the pulmonary rehab program.   PULMONARY REHAB OTHER RESPIRATORY from 07/29/2017 in [place]  Date  07/08/17 [JOACZYS Eating]  Educator  RD  Instruction Review Code  1- Verbalizes Understanding      Knowledge Questionnaire Score: Knowledge Questionnaire Score - 03/31/17 0825      Knowledge Questionnaire Score   Pre Score  11/13       Core Components/Risk Factors/Patient Goals at Admission: Personal Goals and Risk Factors at Admission - 03/19/17 1148      Core Components/Risk Factors/Patient Goals on Admission   Improve shortness of breath with ADL's  Yes    Intervention  Provide education, individualized exercise plan and daily activity instruction to help decrease symptoms of SOB with activities of daily living.    Expected Outcomes  Short Term: Achieves a reduction of symptoms when performing activities of daily living.    Develop more efficient breathing techniques such as purse lipped breathing and diaphragmatic breathing; and practicing  self-pacing with activity  Yes    Intervention  Provide education, demonstration and support about specific breathing techniuqes utilized for more efficient breathing. Include techniques such as pursed lipped breathing, diaphragmatic breathing and self-pacing activity.    Expected Outcomes  Short Term: Participant will be able to demonstrate and use breathing techniques as needed throughout daily activities.    Stress  Yes    Intervention  Offer individual and/or small group education and counseling on adjustment to heart disease, stress management and health-related lifestyle change. Teach and support self-help strategies.;Refer participants experiencing significant psychosocial distress to appropriate mental health specialists for further evaluation and treatment. When possible, include family members and significant others in education/counseling sessions.    Expected Outcomes  Short Term: Participant demonstrates changes in health-related behavior, relaxation and other stress management skills, ability to obtain effective social support, and compliance with psychotropic medications if prescribed.;Long Term: Emotional wellbeing is indicated by absence of clinically significant psychosocial distress or social isolation.       Core Components/Risk Factors/Patient Goals Review:  Goals and Risk Factor Review    Row Name 04/28/17 1724 05/21/17 1210 06/20/17 2141 07/12/17 1601 08/03/17 0749     Core Components/Risk Factors/Patient Goals Review   Personal  Goals Review  Improve shortness of breath with ADL's;Develop more efficient breathing techniques such as purse lipped breathing and diaphragmatic breathing and practicing self-pacing with activity.;Stress  Improve shortness of breath with ADL's;Develop more efficient breathing techniques such as purse lipped breathing and diaphragmatic breathing and practicing self-pacing with activity.;Stress  Improve shortness of breath with ADL's;Develop more efficient  breathing techniques such as purse lipped breathing and diaphragmatic breathing and practicing self-pacing with activity.;Stress  Improve shortness of breath with ADL's;Develop more efficient breathing techniques such as purse lipped breathing and diaphragmatic breathing and practicing self-pacing with activity.;Stress  Improve shortness of breath with ADL's;Develop more efficient breathing techniques such as purse lipped breathing and diaphragmatic breathing and practicing self-pacing with activity.;Stress   Review  Patient has struggles significantly in the program thus far. he is unable to exercise at what he feels to be a sufficient workload related to significant desaturations. recently recieved order from Dr. Lake Bells for oxygen titration to maintain saturations >85%. Patient also has significant depression. he has regular sessions with Jeanella Craze. he has been encouraged to seek medical advice for his depression. he is constantly tearful during his rehab session and requires alot of one on one attention to discuss stressors and exercise intolerance. he had a follow-up with pulmonologist today and hopefully these issues will be addressed.  patient has been placed on an antidepressant and is more accepting of his high oxygen requirements. this acceptance has allowed [place] to increase his workloads as tolerated and he acutally stated this past session that he is beginning to tell a difference in how he feels. He is able to better tolerate workload increases and feels he can do more at home.  patient is on an Statistician. he was referred to [facility] for transplant and recieved a formed letter in the mail telling him he was declined. He then recieved a phone call from his local pulmonologist stating that he(pulmonologist) would still like to referr him to pulmonologist at [facility] for a second opinion for medical care. Patient reluctantly agreed. he states he can tell a difference in his stamina and strength  however his high and low moods definitely affect his performance in rehab.  patient continues to be on an Statistician. Denied for transplant however he has been re-evaluated at [facility] for a second opinion and also referred to a pulmonary vascular physician. He states he is feeling stronger and his shortness of breath is getting better especially with walking on an incline on the treadmill. He utilizes PLB without cueing. His stress levels have not improved related to the issues previously stated. He feels if he could have some concrete information or decisions about his diagnosis/prognosis he would more readily accept what is to come.  patient seems to have settled into his diagnosis and prognosis and is looking forward to his appointment at [facility] on 12/6. He continues to work hard and pushes himself continuously at each session making sure his RPE is closer to a 13 than an 11. He will graduate from the undergraduate program on 12/13 and be slotted into the maintenance program ASAP to minimize any deconditioning at home. He states his stress level related to his diagnosis is improving however he is still dealing with some stress at home. He see a therapist for this.   Expected Outcomes  see admission outcomes  see admission outcomes  see admission outcomes  see admission outcomes  see admission outcomes      Core Components/Risk Factors/Patient Goals at Discharge (Final  Review):  Goals and Risk Factor Review - 08/03/17 0749      Core Components/Risk Factors/Patient Goals Review   Personal Goals Review  Improve shortness of breath with ADL's;Develop more efficient breathing techniques such as purse lipped breathing and diaphragmatic breathing and practicing self-pacing with activity.;Stress    Review  patient seems to have settled into his diagnosis and prognosis and is looking forward to his appointment at [facility] on 12/6. He continues to work hard and pushes himself continuously at each session  making sure his RPE is closer to a 13 than an 11. He will graduate from the undergraduate program on 12/13 and be slotted into the maintenance program ASAP to minimize any deconditioning at home. He states his stress level related to his diagnosis is improving however he is still dealing with some stress at home. He see a therapist for this.    Expected Outcomes  see admission outcomes       ITP Comments:   Comments: patient has attended 21 sessions since admission

## 2017-08-06 ENCOUNTER — Telehealth (HOSPITAL_COMMUNITY): Payer: Self-pay | Admitting: Cardiology

## 2017-08-06 NOTE — Telephone Encounter (Signed)
Patient called to report he was seen at [facility] by Dr Christa See. Patient was advised to NOT start opsumit, scheduled for [facility]HC in 3 weeks, with possibility of starting tyvasso in the future.  Dr Christa See did request CT lung imaging in CD format. (High Resolution CT Chest and VQ scan)  Advised patient normally office will send over correspondense notes for referring provider to review, in the meantime will mail CD to Dr. Christa See and forward to Dr Haroldine Laws as Juluis Rainier   CD mailed to: [facility] [place] [id number] [place] [address]

## 2017-08-10 ENCOUNTER — Encounter (HOSPITAL_COMMUNITY)
Admission: RE | Admit: 2017-08-10 | Discharge: 2017-08-10 | Disposition: A | Discharge status: Home / Self Care | Payer: Medicare Other | Source: Ambulatory Visit | Attending: Pulmonary Disease | Admitting: Pulmonary Disease

## 2017-08-10 DIAGNOSIS — I272 Pulmonary hypertension, unspecified: Secondary | ICD-10-CM | POA: Diagnosis not present

## 2017-08-12 ENCOUNTER — Encounter (HOSPITAL_COMMUNITY)
Admission: RE | Admit: 2017-08-12 | Discharge: 2017-08-12 | Disposition: A | Discharge status: Home / Self Care | Payer: Medicare Other | Source: Ambulatory Visit | Attending: Emergency Medicine | Admitting: Emergency Medicine

## 2017-08-12 VITALS — Wt 172.0 lb

## 2017-08-12 DIAGNOSIS — I272 Pulmonary hypertension, unspecified: Secondary | ICD-10-CM

## 2017-08-12 NOTE — Progress Notes (Signed)
Daily Session Note  Patient Details  Name: [name] MRN: [id number] Date of Birth: 25-Aug-1946 Referring Provider:     Pulmonary Rehab Walk Test from 03/23/2017 in [place]  Referring Provider  Dr. Haroldine Laws      Encounter Date: 08/12/2017  Check In: Session Check In - 08/12/17 1018      Check-In   Location  [facility]    Staff Present  Su Hilt, MS, ACSM RCEP, Exercise Physiologist;Portia Rollene Rotunda, RN, Maxcine Ham, RN, BSN    Supervising physician immediately available to respond to emergencies  [facility] immediately available    Physician(s)  Dr. Starla Link    Medication changes reported      No    Fall or balance concerns reported     No    Tobacco Cessation  No Change    Warm-up and Cool-down  Performed as group-led instruction    Resistance Training Performed  Yes    VAD Patient?  No      Pain Assessment   Currently in Pain?  No/denies    Multiple Pain Sites  No       Capillary Blood Glucose: No results found for this or any previous visit (from the past 24 hour(s)).  Exercise Prescription Changes - 08/12/17 1200      Response to Exercise   Blood Pressure (Admit)  104/60    Blood Pressure (Exercise)  100/70    Blood Pressure (Exit)  102/60    Heart Rate (Admit)  57 bpm    Heart Rate (Exercise)  93 bpm    Heart Rate (Exit)  60 bpm    Oxygen Saturation (Admit)  89 %    Oxygen Saturation (Exercise)  88 %    Oxygen Saturation (Exit)  90 %    Rating of Perceived Exertion (Exercise)  15    Perceived Dyspnea (Exercise)  4    Duration  Continue with 45 min of aerobic exercise without signs/symptoms of physical distress.    Intensity  THRR unchanged      Progression   Progression  Continue to progress workloads to maintain intensity without signs/symptoms of physical distress.      Resistance Training   Training Prescription  Yes    Weight  orange bands    Reps  10-15    Time  10 Minutes       Oxygen   Oxygen  Continuous    Liters  25      Treadmill   MPH  3    Grade  5    Minutes  34      Bike   Level  1.4    Minutes  17       Social History   Tobacco Use  Smoking Status Former Smoker  . Packs/day: 1.00  . Years: 18.00  . Pack years: 18.00  . Last attempt to quit: 09/27/1985  . Years since quitting: 31.8  Smokeless Tobacco Never Used    Goals Met:  Exercise tolerated well No report of cardiac concerns or symptoms Strength training completed today  Goals Unmet:  Not Applicable  Comments: Service time is from 10:30a to 12:05p    Dr. Rush Farmer is Medical Director for Pulmonary Rehab at [facility]

## 2017-08-13 ENCOUNTER — Other Ambulatory Visit: Payer: Self-pay

## 2017-08-13 DIAGNOSIS — I272 Pulmonary hypertension, unspecified: Secondary | ICD-10-CM

## 2017-08-17 ENCOUNTER — Encounter (HOSPITAL_COMMUNITY)
Admission: RE | Admit: 2017-08-17 | Discharge: 2017-08-17 | Disposition: A | Discharge status: Home / Self Care | Payer: Medicare Other | Source: Ambulatory Visit | Attending: Emergency Medicine | Admitting: Emergency Medicine

## 2017-08-17 DIAGNOSIS — I272 Pulmonary hypertension, unspecified: Secondary | ICD-10-CM

## 2017-08-17 NOTE — Progress Notes (Signed)
Daily Session Note  Patient Details  Name: WESTIN KNOTTS MRN: [id number] Date of Birth: [birth year] Referring Provider:     Pulmonary Rehab Walk Test from 03/23/2017 in [place]  Referring Provider  Dr. Haroldine Laws      Encounter Date: 2020/10/1716  Check In: Session Check In - 08/17/17 1402      Check-In   Location  [facility]    Staff Present  Su Hilt, MS, ACSM RCEP, Exercise Physiologist;Thomasene Dubow Ysidro Evert, RN;Portia Goodwin, RN, Maxcine Ham, RN, BSN    Supervising physician immediately available to respond to emergencies  [facility] immediately available    Physician(s)  Dr. Eliseo Squires    Medication changes reported      No    Fall or balance concerns reported     No    Tobacco Cessation  No Change    Warm-up and Cool-down  Performed as group-led instruction    Resistance Training Performed  Yes    VAD Patient?  No      Pain Assessment   Currently in Pain?  No/denies       Capillary Blood Glucose: No results found for this or any previous visit (from the past 24 hour(s)).  Exercise Prescription Changes - 08/17/17 1400      Response to Exercise   Blood Pressure (Admit)  92/58    Blood Pressure (Exercise)  124/80    Blood Pressure (Exit)  96/60    Heart Rate (Admit)  72 bpm    Heart Rate (Exercise)  98 bpm    Heart Rate (Exit)  67 bpm    Oxygen Saturation (Admit)  89 %    Oxygen Saturation (Exercise)  87 %    Oxygen Saturation (Exit)  94 %    Rating of Perceived Exertion (Exercise)  15    Perceived Dyspnea (Exercise)  3    Duration  Continue with 45 min of aerobic exercise without signs/symptoms of physical distress.    Intensity  THRR unchanged      Progression   Progression  Continue to progress workloads to maintain intensity without signs/symptoms of physical distress.      Resistance Training   Training Prescription  Yes    Weight  orange bands    Reps  10-15    Time  10 Minutes      Oxygen   Oxygen  Continuous    Liters  25      Treadmill   MPH  3    Grade  5    Minutes  34      Bike   Level  0.9    Minutes  17       Social History   Tobacco Use  Smoking Status Former Smoker  . Packs/day: 1.00  . Years: 18.00  . Pack years: 18.00  . Last attempt to quit: 09/27/1985  . Years since quitting: 31.9  Smokeless Tobacco Never Used    Goals Met:  Exercise tolerated well No report of cardiac concerns or symptoms Strength training completed today  Goals Unmet:  Not Applicable  Comments: Service time is from 1030 to 1215    Dr. Rush Farmer is Medical Director for Pulmonary Rehab at [facility].

## 2017-09-06 ENCOUNTER — Encounter (HOSPITAL_COMMUNITY): Payer: Self-pay

## 2017-09-06 DIAGNOSIS — I272 Pulmonary hypertension, unspecified: Secondary | ICD-10-CM

## 2017-09-06 NOTE — Progress Notes (Signed)
Discharge Progress Report  Patient Details  Name: [name] MRN: [id number] Date of Birth: 11-10-1945 Referring Provider:     Pulmonary Rehab Walk Test from 03/23/2017 in [place]  Referring Provider  Dr. Haroldine Laws       Number of Visits: 27  Reason for Discharge:  Patient reached a stable level of exercise. Patient independent in their exercise. Patient has met program and personal goals.  Smoking History:  Social History   Tobacco Use  Smoking Status Former Smoker  . Packs/day: 1.00  . Years: 18.00  . Pack years: 18.00  . Last attempt to quit: 09/27/1985  . Years since quitting: 31.9  Smokeless Tobacco Never Used    Diagnosis:  Pulmonary hypertension ([place])  ADL UCSD: Pulmonary Assessment Scores    Row Name 08/10/17 1432         ADL UCSD   ADL Phase  Exit       mMRC Score   mMRC Score  0        Initial Exercise Prescription:   Discharge Exercise Prescription (Final Exercise Prescription Changes): Exercise Prescription Changes - 08/17/17 1400      Response to Exercise   Blood Pressure (Admit)  92/58    Blood Pressure (Exercise)  124/80    Blood Pressure (Exit)  96/60    Heart Rate (Admit)  72 bpm    Heart Rate (Exercise)  98 bpm    Heart Rate (Exit)  67 bpm    Oxygen Saturation (Admit)  89 %    Oxygen Saturation (Exercise)  87 %    Oxygen Saturation (Exit)  94 %    Rating of Perceived Exertion (Exercise)  15    Perceived Dyspnea (Exercise)  3    Duration  Continue with 45 min of aerobic exercise without signs/symptoms of physical distress.    Intensity  THRR unchanged      Progression   Progression  Continue to progress workloads to maintain intensity without signs/symptoms of physical distress.      Resistance Training   Training Prescription  Yes    Weight  orange bands    Reps  10-15    Time  10 Minutes      Oxygen   Oxygen  Continuous    Liters  25      Treadmill   MPH  3    Grade  5     Minutes  34      Bike   Level  0.9    Minutes  17       Functional Capacity: 6 Minute Walk    Row Name 08/10/17 1420         6 Minute Walk   Phase  Discharge     Distance  1400 feet     Distance Feet Change  547 ft     Walk Time  5 minutes stopped at 5 minutes     # of Rest Breaks  1 1 (1 minute rest)     MPH  2.65     METS  3.07     RPE  11     Perceived Dyspnea   2     Symptoms  Yes (comment)     Comments  Patient has stopped Opsumit-desat was more prominent and recovery was slower.      Resting HR  60 bpm     Resting BP  96/54     Resting Oxygen Saturation   96 %  Exercise Oxygen Saturation  during 6 min walk  81 %     Max Ex. HR  112 bpm     Max Ex. BP  130/60       Interval HR   1 Minute HR  70     2 Minute HR  92     3 Minute HR  106     4 Minute HR  112     5 Minute HR  100     6 Minute HR  91     2 Minute Post HR  75     Interval Heart Rate?  Yes       Interval Oxygen   Interval Oxygen?  Yes     Baseline Oxygen Saturation %  96 %     1 Minute Oxygen Saturation %  91 %     1 Minute Liters of Oxygen  15 L     2 Minute Oxygen Saturation %  92 %     2 Minute Liters of Oxygen  25 L     3 Minute Oxygen Saturation %  86 %     3 Minute Liters of Oxygen  25 L     4 Minute Oxygen Saturation %  88 %     4 Minute Liters of Oxygen  25 L     5 Minute Oxygen Saturation %  81 %     5 Minute Liters of Oxygen  25 L     6 Minute Oxygen Saturation %  84 %     6 Minute Liters of Oxygen  25 L     2 Minute Post Oxygen Saturation %  94 %     2 Minute Post Liters of Oxygen  25 L        Psychological, QOL, Others - Outcomes: PHQ 2/9: Depression screen [facility] 2/9 08/10/2017 03/19/2017  Decreased Interest 0 0  Down, Depressed, Hopeless 0 2  PHQ - 2 Score 0 2  Altered sleeping 0 0  Tired, decreased energy 0 1  Change in appetite 0 0  Feeling bad or failure about yourself  1 2  Trouble concentrating 0 0  Moving slowly or fidgety/restless 0 0  Suicidal thoughts 0 0   PHQ-9 Score 1 5  Difficult doing work/chores Not difficult at all Not difficult at all    Quality of Life:   Personal Goals: Goals established at orientation with interventions provided to work toward goal.    Personal Goals Discharge: Goals and Risk Factor Review    Row Name 07/12/17 1601 08/03/17 0749 09/06/17 1025         Core Components/Risk Factors/Patient Goals Review   Personal Goals Review  Improve shortness of breath with ADL's;Develop more efficient breathing techniques such as purse lipped breathing and diaphragmatic breathing and practicing self-pacing with activity.;Stress  Improve shortness of breath with ADL's;Develop more efficient breathing techniques such as purse lipped breathing and diaphragmatic breathing and practicing self-pacing with activity.;Stress  Improve shortness of breath with ADL's;Develop more efficient breathing techniques such as purse lipped breathing and diaphragmatic breathing and practicing self-pacing with activity.;Stress     Review  patient continues to be on an Statistician. Denied for transplant however he has been re-evaluated at [facility] for a second opinion and also referred to a pulmonary vascular physician. He states he is feeling stronger and his shortness of breath is getting better especially with walking on an incline on the treadmill. He utilizes PLB without  cueing. His stress levels have not improved related to the issues previously stated. He feels if he could have some concrete information or decisions about his diagnosis/prognosis he would more readily accept what is to come.  patient seems to have settled into his diagnosis and prognosis and is looking forward to his appointment at [facility] on 12/6. He continues to work hard and pushes himself continuously at each session making sure his RPE is closer to a 13 than an 11. He will graduate from the undergraduate program on 12/13 and be slotted into the maintenance program ASAP to  minimize any deconditioning at home. He states his stress level related to his diagnosis is improving however he is still dealing with some stress at home. He see a therapist for this.  goal met     Expected Outcomes  see admission outcomes  see admission outcomes  -        Exercise Goals and Review:   Nutrition & Weight - Outcomes:    Nutrition:   Nutrition Discharge: Nutrition Assessments - 09/06/17 0843      Rate Your Plate Scores   Pre Score  64    Post Score  68       Education Questionnaire Score:   Goals reviewed with patient;  Patient plans to enroll in the pulmonary rehab maintenance program to continue his exercise regimen.

## 2017-09-07 ENCOUNTER — Encounter (HOSPITAL_COMMUNITY)
Admission: RE | Admit: 2017-09-07 | Discharge: 2017-09-07 | Disposition: A | Discharge status: Home / Self Care | Payer: Self-pay | Source: Ambulatory Visit | Attending: Pulmonary Disease | Admitting: Pulmonary Disease

## 2017-09-07 DIAGNOSIS — I272 Pulmonary hypertension, unspecified: Secondary | ICD-10-CM | POA: Insufficient documentation

## 2017-09-07 DIAGNOSIS — Z7982 Long term (current) use of aspirin: Secondary | ICD-10-CM | POA: Insufficient documentation

## 2017-09-07 DIAGNOSIS — F411 Generalized anxiety disorder: Secondary | ICD-10-CM | POA: Insufficient documentation

## 2017-09-07 DIAGNOSIS — Z79899 Other long term (current) drug therapy: Secondary | ICD-10-CM | POA: Insufficient documentation

## 2017-09-07 DIAGNOSIS — E785 Hyperlipidemia, unspecified: Secondary | ICD-10-CM | POA: Insufficient documentation

## 2017-09-07 DIAGNOSIS — I2510 Atherosclerotic heart disease of native coronary artery without angina pectoris: Secondary | ICD-10-CM | POA: Insufficient documentation

## 2017-09-07 DIAGNOSIS — Z87891 Personal history of nicotine dependence: Secondary | ICD-10-CM | POA: Insufficient documentation

## 2017-09-09 ENCOUNTER — Encounter (HOSPITAL_COMMUNITY)
Admission: RE | Admit: 2017-09-09 | Discharge: 2017-09-09 | Disposition: A | Discharge status: Home / Self Care | Payer: Self-pay | Source: Ambulatory Visit | Attending: Internal Medicine | Admitting: Internal Medicine

## 2017-09-14 ENCOUNTER — Encounter (HOSPITAL_COMMUNITY)
Admission: RE | Admit: 2017-09-14 | Discharge: 2017-09-14 | Disposition: A | Discharge status: Home / Self Care | Payer: Self-pay | Source: Ambulatory Visit | Attending: Internal Medicine | Admitting: Internal Medicine

## 2017-09-15 ENCOUNTER — Ambulatory Visit: Payer: Medicare Other | Admitting: Pulmonary Disease

## 2017-09-15 ENCOUNTER — Encounter: Payer: Self-pay | Admitting: Pulmonary Disease

## 2017-09-15 VITALS — BP 120/78 | HR 56 | Ht 72.0 in | Wt 172.6 lb

## 2017-09-15 DIAGNOSIS — J432 Centrilobular emphysema: Secondary | ICD-10-CM

## 2017-09-15 DIAGNOSIS — J9611 Chronic respiratory failure with hypoxia: Secondary | ICD-10-CM | POA: Diagnosis not present

## 2017-09-15 DIAGNOSIS — I272 Pulmonary hypertension, unspecified: Secondary | ICD-10-CM | POA: Diagnosis not present

## 2017-09-15 NOTE — Patient Instructions (Signed)
Chronic respiratory failure with hypoxemia: Continue oxygen at 3 L/min at rest, enough to maintain your O2 saturation greater than 80% with exercise  Severe emphysema/COPD: Continue Trelegy daily Practice good hand hygiene this time of year Continue exercise: I would like for you to increase the amount of weight you are using with resistance training and know that from my standpoint is okay for your O2 saturation to drop as low as 80% while exercising  Pulmonary hypertension: Continue follow-up with cardiology here and at [facility] I am okay with you taking Tyvaso, watch out for your O2 saturation dropping after taking it however  I will see you back in 3-4 months or sooner if needed

## 2017-09-15 NOTE — Progress Notes (Signed)
Subjective:    Patient ID: [name], male    DOB: 02-27-1946, 72 y.o.   MRN: [id number]  Synopsis: Patient followed by Dr. Lamonte Sakai for years transferred care to [facility] in 2018. Has a past medical history significant for pulmonary hypertension, COPD. 2018 CT scan of the chest showed centrilobular emphysema.  HPI Chief Complaint  Patient presents with  . Follow-up    Pt states he has been doing okay since last visit. Breathing is about the same. Becomes SOB with exercise. Denies any cough or CP.   Sircharles stopped Opsumit for about 18 days per the recommendations from [place], but he says that he didn't feel OK.  He went for a repeat RHC and he was told that the pressure in his right ventricle had improved from 43 to 39.  He reports that he had some reversibility during the test based on using an inhaled medicine.  He says that they recommended that he start Tyvaso and they plan to re-evaluate him further.    When he is on the treadmill he is on 25 Lpm of O2.  He is able to do 30 minutes of treadmill time with an elevation.  He is doing the elliptical.    He has gained some weight and he is up to 170 pounds.    Past Medical History:  Diagnosis Date  . CAD (coronary artery disease)   . GAD (generalized anxiety disorder)   . Hyperlipidemia   . Overweight(278.02)   . Pulmonary hypertension ([place])        Review of Systems  Constitutional: Negative for fever and unexpected weight change.  HENT: Negative for congestion, dental problem, ear pain, nosebleeds, postnasal drip, rhinorrhea, sinus pressure, sneezing, sore throat and trouble swallowing.   Eyes: Negative for redness and itching.  Respiratory: Positive for shortness of breath. Negative for cough, chest tightness and wheezing.   Cardiovascular: Negative for palpitations and leg swelling.  Gastrointestinal: Negative for nausea and vomiting.  Genitourinary: Negative for dysuria.  Musculoskeletal: Negative for joint swelling.  Skin:  Negative for rash.  Allergic/Immunologic: Negative.  Negative for environmental allergies, food allergies and immunocompromised state.  Neurological: Negative for headaches.  Hematological: Does not bruise/bleed easily.  Psychiatric/Behavioral: Negative for dysphoric mood. The patient is not nervous/anxious.        Objective:   Physical Exam Vitals:   09/15/17 0908  BP: 120/78  Pulse: (!) 56  SpO2: 93%  Weight: 172 lb 9.6 oz (78.3 kg)  Height: 6' (1.829 m)   3L   Gen: chronically ill  appearing HENT: OP clear, TM's clear, neck supple PULM: Poor air movement but no wheezing B, normal percussion CV: RRR, no mgr, trace edema GI: BS+, soft, nontender Derm: no cyanosis or rash Psyche: normal mood and affect     Chest imaging: April 2018 CT chest images independently reviewed showing moderate to severe centrilobular emphysema, 3 mm right upper lobe solid pulmonary nodule. Emphysema is upper lobe predominant April 2018 VQ scan showed some mismatch in the upper lobes where he has emphysema but otherwise no worrisome findings for chronic, embolic pulmonary hypertension  Echo: February 2018 LVEF 60-65% with grade 1 diastolic dysfunction RV mildly dilated with normal function. 05/2017 LVEF 60-65%, RV mildly dilated, RVSP 99mHg  PFT: January 2018: Ratio 65%, FEV1 2.96 L 85% predicted, 13 % change post bronchodilator, FVC 4.5 L 96% predicted, total lung capacity 6.84 L 92% predicted, DLCO 8.5 324% predicted  Heart Cath: 10/2016 RHC: RA 4, RV  78/4, PA 79/23 (43), PCW 4, Fick 5.2/2.5 10/2016 LHC:   Mild to moderate left sided CAD.  Mid RCA lesion, 100 %stenosed. 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  Ost LM to LM lesion, 20 %stenosed.  The left ventricular systolic function is normal.  LV end diastolic pressure is normal.  08/27/2018 RHC [facility]: Cardiac Index (l/min/m2) 3.0 L/min/m2  Right Atrium Mean Pressure (mmHg) 3 mmHg  Right Ventricle  Systolic Pressure (mmHg) 68 mmHg  Pulmonary Artery Mean Pressure (mmHg) 39 mmHg  Pulmonary Wedge Pressure (mmHg) 5 mmHg  Pulmonary Vascular Resistance (Wood units) 5.7 Wood units        Assessment & Plan:   No diagnosis found.  Discussion: Nathanyl looks great today.  He has been exercising more, his attitude is better.  He is tolerating more exercise it sounds like.  I reviewed the records from [facility] and the most recent right heart catheterization showed a slight improvement in his pulmonary vascular resistance.  They are planning to add Tyvaso which I think is not unreasonable as he still has some relatively spared lung units in the bases.  Hopefully we will see improved exercise tolerance and improved oxygen needs.  However, I have advised him to watch out for paradoxical worsening of his hypoxemia.  In regards to COPD it seems that things are stable.  No new wheezing cough or shortness of breath.  No recent exacerbations.  He is tolerant of Trelegy.  I believe that he could exercise more.  It is totally reasonable to allow him to have an O2 saturation as low as 80% while exercising as this is only a transient thing.  We know that patients with emphysema to have transient hypoxemia with exercise have no difference in mortality compared to those who are supplemented with oxygen to maintain an O2 saturation greater than 88% with exercise.  Plan: Chronic respiratory failure with hypoxemia: Continue oxygen at 3 L/min at rest, enough to maintain your O2 saturation greater than 80% with exercise  Severe emphysema/COPD: Continue Trelegy daily Practice good hand hygiene this time of year Continue exercise: I would like for you to increase the amount of weight you are using with resistance training and know that from my standpoint is okay for your O2 saturation to drop as low as 80% while exercising  Pulmonary hypertension: Continue follow-up with cardiology here and at [facility] I am okay with you  taking Tyvaso, watch out for your O2 saturation dropping after taking it however  I will see you back in 3-4 months or sooner if needed    Current Outpatient Medications:  .  aspirin 81 MG tablet, Take 81 mg by mouth every evening. , Disp: , Rfl:  .  Azelastine-Fluticasone (DYMISTA) 137-50 MCG/ACT SUSP, Place 1-2 sprays into the nose daily., Disp: 1 Bottle, Rfl: 5 .  clonazePAM (KLONOPIN) 0.5 MG tablet, Take 0.5 mg 3 (three) times daily as needed by mouth for anxiety. , Disp: , Rfl: 0 .  Coenzyme Q10 (VITALINE COQ10) 100 MG WAFR, Take 60 mg by mouth daily., Disp: , Rfl:  .  DULoxetine (CYMBALTA) 30 MG capsule, TK 1 TO 2 CS PO QD, Disp: , Rfl:  .  ezetimibe (ZETIA) 10 MG tablet, Take 1 tablet (10 mg total) by mouth every evening., Disp: 90 tablet, Rfl: 3 .  Fluticasone-Umeclidin-Vilant (TRELEGY ELLIPTA) 100-62.5-25 MCG/INH AEPB, Inhale 1 puff into the lungs daily., Disp: 60 each, Rfl: 5 .  Lactobacillus (ACIDOPHILUS PO), Take 1  capsule by mouth daily. , Disp: , Rfl:  .  Macitentan (OPSUMIT) 10 MG TABS, Take 1 tablet (10 mg total) by mouth daily., Disp: 30 tablet, Rfl: 11 .  mirtazapine (REMERON) 15 MG tablet, Take 15 mg by mouth at bedtime., Disp: , Rfl:  .  Multiple Vitamins-Minerals (CENTRUM CARDIO PO), Take 1 tablet by mouth daily. , Disp: , Rfl:  .  Omega-3 Fatty Acids (OMEGA 3 PO), Take 3,200 mg by mouth 2 (two) times daily. Nordic Natural ultimate Omega 3 , Disp: , Rfl:  .  rosuvastatin (CRESTOR) 40 MG tablet, Take 40 mg daily by mouth., Disp: , Rfl:  .  rosuvastatin (CRESTOR) 40 MG tablet, Take 1 tablet (40 mg total) by mouth daily., Disp: 90 tablet, Rfl: 3

## 2017-09-16 ENCOUNTER — Encounter (HOSPITAL_COMMUNITY)
Admission: RE | Admit: 2017-09-16 | Discharge: 2017-09-16 | Disposition: A | Discharge status: Home / Self Care | Payer: Self-pay | Source: Ambulatory Visit | Attending: Internal Medicine | Admitting: Internal Medicine

## 2017-09-17 ENCOUNTER — Telehealth (HOSPITAL_COMMUNITY): Payer: Self-pay | Admitting: Pharmacist

## 2017-09-17 NOTE — Telephone Encounter (Signed)
Fax from [facility] stating that they were closing his grant was sent to the wrong MD fax number so we never received it. Patient will email to me and I will forward to PANF to help with decision to reopen.   Ruta Hinds. Velva Harman, PharmD, BCPS, CPP Clinical Pharmacist Phone: [phone] 09/17/2017 3:39 PM

## 2017-09-17 NOTE — Telephone Encounter (Signed)
Patient called stating he got a message from [facility] that his [place] grant was closed on 09/15/17 because he had not used it for >120 days because he had reached his out of pocket max last year. With the new year, he is needing to use the PANF again and has called to see if it can be reopened for him. The decision will take 15-21 days to decide if it will be reopened. In the meantime, I have sent in a new PA for renewal so that that will not hold [place] up. Left VM to call me back.   Ruta Hinds. Velva Harman, PharmD, BCPS, CPP Clinical Pharmacist Phone: [phone] 09/17/2017 9:59 AM

## 2017-09-17 NOTE — Telephone Encounter (Signed)
Opsumit PA approved by [employer] D through 08/30/18.  Ruta Hinds. Velva Harman, PharmD, BCPS, CPP Clinical Pharmacist Phone: [phone] 09/17/2017 3:36 PM

## 2017-09-21 ENCOUNTER — Telehealth (HOSPITAL_COMMUNITY): Payer: Self-pay | Admitting: Pharmacist

## 2017-09-21 ENCOUNTER — Encounter (HOSPITAL_COMMUNITY)
Admission: RE | Admit: 2017-09-21 | Discharge: 2017-09-21 | Disposition: A | Discharge status: Home / Self Care | Payer: Self-pay | Source: Ambulatory Visit | Attending: Internal Medicine | Admitting: Internal Medicine

## 2017-09-21 NOTE — Telephone Encounter (Signed)
Opsumit PA approved by [employer] through 08/30/18.   Ruta Hinds. Velva Harman, PharmD, BCPS, CPP Clinical Pharmacist Phone: [phone] 09/21/2017 1:44 PM

## 2017-09-23 ENCOUNTER — Encounter (HOSPITAL_COMMUNITY)
Admission: RE | Admit: 2017-09-23 | Discharge: 2017-09-23 | Disposition: A | Discharge status: Home / Self Care | Payer: Self-pay | Source: Ambulatory Visit | Attending: Internal Medicine | Admitting: Internal Medicine

## 2017-09-28 ENCOUNTER — Encounter (HOSPITAL_COMMUNITY)
Admission: RE | Admit: 2017-09-28 | Discharge: 2017-09-28 | Disposition: A | Discharge status: Home / Self Care | Payer: Self-pay | Source: Ambulatory Visit | Attending: Internal Medicine | Admitting: Internal Medicine

## 2017-09-30 ENCOUNTER — Encounter (HOSPITAL_COMMUNITY)
Admission: RE | Admit: 2017-09-30 | Discharge: 2017-09-30 | Disposition: A | Discharge status: Home / Self Care | Payer: Self-pay | Source: Ambulatory Visit | Attending: Internal Medicine | Admitting: Internal Medicine

## 2017-10-05 ENCOUNTER — Encounter (HOSPITAL_COMMUNITY)
Admission: RE | Admit: 2017-10-05 | Discharge: 2017-10-05 | Disposition: A | Discharge status: Home / Self Care | Payer: Self-pay | Source: Ambulatory Visit | Attending: Pulmonary Disease | Admitting: Pulmonary Disease

## 2017-10-05 DIAGNOSIS — I2510 Atherosclerotic heart disease of native coronary artery without angina pectoris: Secondary | ICD-10-CM | POA: Insufficient documentation

## 2017-10-05 DIAGNOSIS — Z87891 Personal history of nicotine dependence: Secondary | ICD-10-CM | POA: Insufficient documentation

## 2017-10-05 DIAGNOSIS — Z7982 Long term (current) use of aspirin: Secondary | ICD-10-CM | POA: Insufficient documentation

## 2017-10-05 DIAGNOSIS — I272 Pulmonary hypertension, unspecified: Secondary | ICD-10-CM | POA: Insufficient documentation

## 2017-10-05 DIAGNOSIS — Z79899 Other long term (current) drug therapy: Secondary | ICD-10-CM | POA: Insufficient documentation

## 2017-10-05 DIAGNOSIS — E785 Hyperlipidemia, unspecified: Secondary | ICD-10-CM | POA: Insufficient documentation

## 2017-10-05 DIAGNOSIS — F411 Generalized anxiety disorder: Secondary | ICD-10-CM | POA: Insufficient documentation

## 2017-10-07 ENCOUNTER — Encounter (HOSPITAL_COMMUNITY): Payer: Self-pay

## 2017-10-12 ENCOUNTER — Encounter (HOSPITAL_COMMUNITY)
Admission: RE | Admit: 2017-10-12 | Discharge: 2017-10-12 | Disposition: A | Discharge status: Home / Self Care | Payer: Self-pay | Source: Ambulatory Visit | Attending: Internal Medicine | Admitting: Internal Medicine

## 2017-10-14 ENCOUNTER — Encounter (HOSPITAL_COMMUNITY)
Admission: RE | Admit: 2017-10-14 | Discharge: 2017-10-14 | Disposition: A | Discharge status: Home / Self Care | Payer: Self-pay | Source: Ambulatory Visit | Attending: Internal Medicine | Admitting: Internal Medicine

## 2017-10-19 ENCOUNTER — Encounter (HOSPITAL_COMMUNITY)
Admission: RE | Admit: 2017-10-19 | Discharge: 2017-10-19 | Disposition: A | Discharge status: Home / Self Care | Payer: Self-pay | Source: Ambulatory Visit | Attending: Internal Medicine | Admitting: Internal Medicine

## 2017-10-21 ENCOUNTER — Encounter (HOSPITAL_COMMUNITY)
Admission: RE | Admit: 2017-10-21 | Discharge: 2017-10-21 | Disposition: A | Discharge status: Home / Self Care | Payer: Self-pay | Source: Ambulatory Visit | Attending: Internal Medicine | Admitting: Internal Medicine

## 2017-10-26 ENCOUNTER — Encounter (HOSPITAL_COMMUNITY)
Admission: RE | Admit: 2017-10-26 | Discharge: 2017-10-26 | Disposition: A | Discharge status: Home / Self Care | Payer: Self-pay | Source: Ambulatory Visit | Attending: Internal Medicine | Admitting: Internal Medicine

## 2017-10-28 ENCOUNTER — Encounter (HOSPITAL_COMMUNITY)
Admission: RE | Admit: 2017-10-28 | Discharge: 2017-10-28 | Disposition: A | Discharge status: Home / Self Care | Payer: Self-pay | Source: Ambulatory Visit | Attending: Internal Medicine | Admitting: Internal Medicine

## 2017-11-02 ENCOUNTER — Encounter (HOSPITAL_COMMUNITY)
Admission: RE | Admit: 2017-11-02 | Discharge: 2017-11-02 | Disposition: A | Discharge status: Home / Self Care | Payer: Self-pay | Source: Ambulatory Visit | Attending: Pulmonary Disease | Admitting: Pulmonary Disease

## 2017-11-02 DIAGNOSIS — I2510 Atherosclerotic heart disease of native coronary artery without angina pectoris: Secondary | ICD-10-CM | POA: Insufficient documentation

## 2017-11-02 DIAGNOSIS — Z87891 Personal history of nicotine dependence: Secondary | ICD-10-CM | POA: Insufficient documentation

## 2017-11-02 DIAGNOSIS — F411 Generalized anxiety disorder: Secondary | ICD-10-CM | POA: Insufficient documentation

## 2017-11-02 DIAGNOSIS — I272 Pulmonary hypertension, unspecified: Secondary | ICD-10-CM | POA: Insufficient documentation

## 2017-11-02 DIAGNOSIS — Z79899 Other long term (current) drug therapy: Secondary | ICD-10-CM | POA: Insufficient documentation

## 2017-11-02 DIAGNOSIS — E785 Hyperlipidemia, unspecified: Secondary | ICD-10-CM | POA: Insufficient documentation

## 2017-11-02 DIAGNOSIS — Z7982 Long term (current) use of aspirin: Secondary | ICD-10-CM | POA: Insufficient documentation

## 2017-11-04 ENCOUNTER — Encounter (HOSPITAL_COMMUNITY)
Admission: RE | Admit: 2017-11-04 | Discharge: 2017-11-04 | Disposition: A | Discharge status: Home / Self Care | Payer: Self-pay | Source: Ambulatory Visit | Attending: Internal Medicine | Admitting: Internal Medicine

## 2017-11-09 ENCOUNTER — Encounter (HOSPITAL_COMMUNITY)
Admission: RE | Admit: 2017-11-09 | Discharge: 2017-11-09 | Disposition: A | Discharge status: Home / Self Care | Payer: Self-pay | Source: Ambulatory Visit | Attending: Internal Medicine | Admitting: Internal Medicine

## 2017-11-09 ENCOUNTER — Other Ambulatory Visit: Payer: Self-pay | Admitting: Acute Care

## 2017-11-10 ENCOUNTER — Telehealth: Payer: Self-pay | Admitting: Pulmonary Disease

## 2017-11-10 MED ORDER — FLUTICASONE-UMECLIDIN-VILANT 100-62.5-25 MCG/INH IN AEPB: 1.0000 | INHALATION_SPRAY | Freq: Every day | RESPIRATORY_TRACT | 5 refills | Status: DC

## 2017-11-10 NOTE — Telephone Encounter (Signed)
Pt was on Trelegy, not Anoro., Sent in Rx's to [facility]. Nothing further is needed.    Patient Instructions by Juanito Doom, MD at 09/15/2017 9:00 AM   Author: Juanito Doom, MD Author Type: Physician Filed: 09/15/2017 9:45 AM  Note Status: Signed Cosign: Cosign Not Required Encounter Date: 09/15/2017  Editor: Juanito Doom, MD (Physician)    Chronic respiratory failure with hypoxemia: Continue oxygen at 3 L/min at rest, enough to maintain your O2 saturation greater than 80% with exercise  Severe emphysema/COPD: Continue Trelegy daily Practice good hand hygiene this time of year Continue exercise: I would like for you to increase the amount of weight you are using with resistance training and know that from my standpoint is okay for your O2 saturation to drop as low as 80% while exercising  Pulmonary hypertension: Continue follow-up with cardiology here and at [facility] I am okay with you taking Tyvaso, watch out for your O2 saturation dropping after taking it however  I will see you back in 3-4 months or sooner if needed

## 2017-11-11 ENCOUNTER — Encounter (HOSPITAL_COMMUNITY)
Admission: RE | Admit: 2017-11-11 | Discharge: 2017-11-11 | Disposition: A | Discharge status: Home / Self Care | Payer: Self-pay | Source: Ambulatory Visit | Attending: Internal Medicine | Admitting: Internal Medicine

## 2017-11-15 ENCOUNTER — Telehealth: Payer: Self-pay | Admitting: Pulmonary Disease

## 2017-11-15 NOTE — Telephone Encounter (Signed)
[facility] was needing an OV note showing patient was on o2. Note sent and faxed to [phone] nothing further needed.

## 2017-11-15 NOTE — Telephone Encounter (Signed)
Called courtney with [facility]. Unable to reach left detailed message to give [place] a call back.

## 2017-11-15 NOTE — Telephone Encounter (Signed)
[name] is calling back  [phone]

## 2017-11-16 ENCOUNTER — Telehealth: Payer: Self-pay | Admitting: Pulmonary Disease

## 2017-11-16 ENCOUNTER — Encounter (HOSPITAL_COMMUNITY)
Admission: RE | Admit: 2017-11-16 | Discharge: 2017-11-16 | Disposition: A | Discharge status: Home / Self Care | Payer: Self-pay | Source: Ambulatory Visit | Attending: Internal Medicine | Admitting: Internal Medicine

## 2017-11-16 NOTE — Telephone Encounter (Signed)
Spoke with [name]  She states needing ov note faxed to her at [phone] Done and nothing further needed

## 2017-11-18 ENCOUNTER — Encounter (HOSPITAL_COMMUNITY)
Admission: RE | Admit: 2017-11-18 | Discharge: 2017-11-18 | Disposition: A | Discharge status: Home / Self Care | Payer: Self-pay | Source: Ambulatory Visit | Attending: Internal Medicine | Admitting: Internal Medicine

## 2017-11-19 IMAGING — DX DG CHEST 1V PORT
1 series · 1 of 1 positions shown · non-contrast
Comparison: 09/24/2016

CLINICAL DATA: Worsening chest pain and dyspnea since late
yesterday evening

EXAM:
PORTABLE CHEST 1 VIEW

[chest ap]
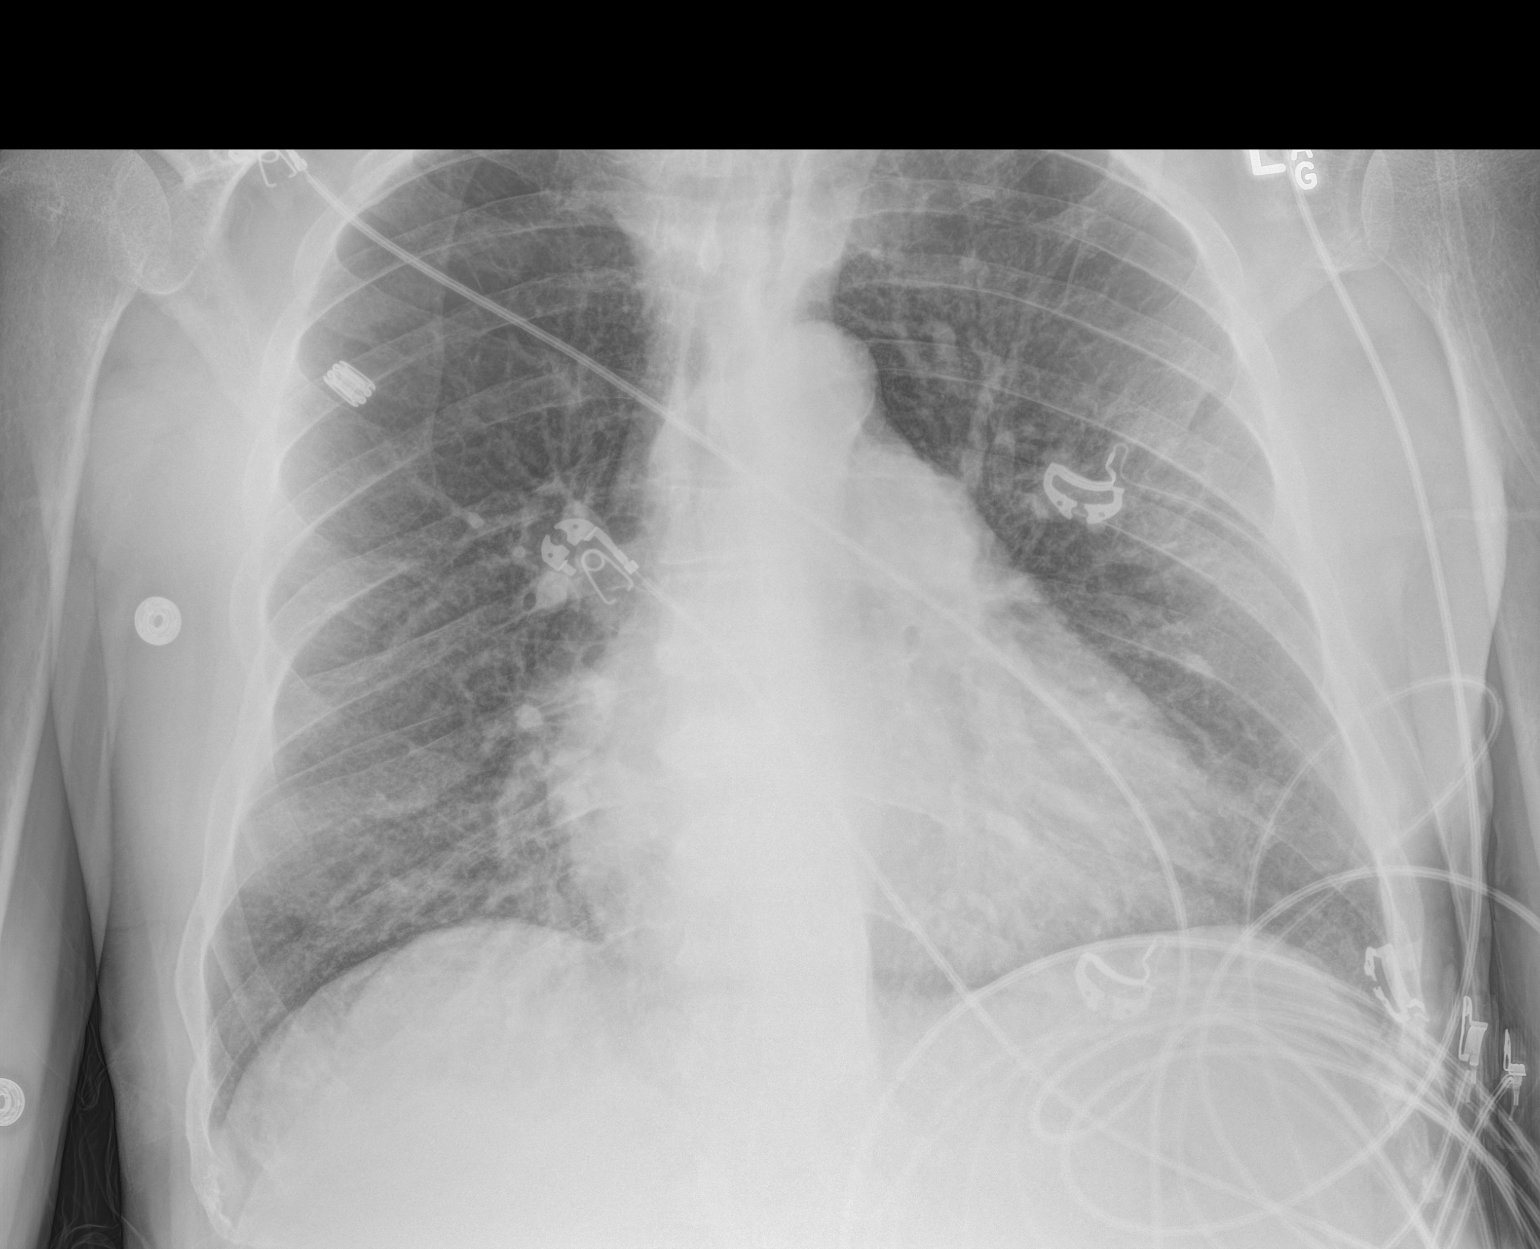

[1 of 1 positions shown; findings below may reference images not displayed]

FINDINGS: A single AP portable view of the chest demonstrates no focal
airspace consolidation or alveolar edema. The lungs are grossly
clear. There is no large effusion or pneumothorax. Cardiac and
mediastinal contours appear unremarkable.
IMPRESSION: No active disease.

## 2017-11-23 ENCOUNTER — Encounter (HOSPITAL_COMMUNITY)
Admission: RE | Admit: 2017-11-23 | Discharge: 2017-11-23 | Disposition: A | Discharge status: Home / Self Care | Payer: Self-pay | Source: Ambulatory Visit | Attending: Internal Medicine | Admitting: Internal Medicine

## 2017-11-25 ENCOUNTER — Encounter (HOSPITAL_COMMUNITY)
Admission: RE | Admit: 2017-11-25 | Discharge: 2017-11-25 | Disposition: A | Discharge status: Home / Self Care | Payer: Self-pay | Source: Ambulatory Visit | Attending: Internal Medicine | Admitting: Internal Medicine

## 2017-11-30 ENCOUNTER — Encounter (HOSPITAL_COMMUNITY): Payer: Self-pay

## 2017-11-30 DIAGNOSIS — F411 Generalized anxiety disorder: Secondary | ICD-10-CM | POA: Insufficient documentation

## 2017-11-30 DIAGNOSIS — I272 Pulmonary hypertension, unspecified: Secondary | ICD-10-CM | POA: Insufficient documentation

## 2017-11-30 DIAGNOSIS — Z7982 Long term (current) use of aspirin: Secondary | ICD-10-CM | POA: Insufficient documentation

## 2017-11-30 DIAGNOSIS — I2510 Atherosclerotic heart disease of native coronary artery without angina pectoris: Secondary | ICD-10-CM | POA: Insufficient documentation

## 2017-11-30 DIAGNOSIS — Z87891 Personal history of nicotine dependence: Secondary | ICD-10-CM | POA: Insufficient documentation

## 2017-11-30 DIAGNOSIS — Z79899 Other long term (current) drug therapy: Secondary | ICD-10-CM | POA: Insufficient documentation

## 2017-11-30 DIAGNOSIS — E785 Hyperlipidemia, unspecified: Secondary | ICD-10-CM | POA: Insufficient documentation

## 2017-12-02 ENCOUNTER — Encounter (HOSPITAL_COMMUNITY)
Admission: RE | Admit: 2017-12-02 | Discharge: 2017-12-02 | Disposition: A | Discharge status: Home / Self Care | Payer: Medicare Other | Source: Ambulatory Visit | Attending: Pulmonary Disease | Admitting: Pulmonary Disease

## 2017-12-07 ENCOUNTER — Encounter (HOSPITAL_COMMUNITY)
Admission: RE | Admit: 2017-12-07 | Discharge: 2017-12-07 | Disposition: A | Discharge status: Home / Self Care | Payer: Self-pay | Source: Ambulatory Visit | Attending: Internal Medicine | Admitting: Internal Medicine

## 2017-12-07 ENCOUNTER — Telehealth: Payer: Self-pay | Admitting: Acute Care

## 2017-12-07 NOTE — Progress Notes (Signed)
[name] arrived to exercise at the Pulmonary Maintenance program today. He states that he has swelling in his left leg. On assessment he has edema in both legs below the knees left greater than the right. He states that he noticed it yesterday. His lungs has rales in the bases. He states that his weight is up some, but he has not been keeping a record of it. Called Dr. Anastasia Pall office to see if he could get an appointment today. I was able to get him an appointment 4/10 at 4:30 pm with Judson Roch. The Pulmonologist wanted him checked today to rule out a blood clot. I called and got [name] an appointment with his PCP Melinda Crutch today at 1:45 pm. Discussed with [name] low sodium diet choices and daily weights. Gave him a handout on the diet.

## 2017-12-07 NOTE — Telephone Encounter (Signed)
Called and spoke with Portia from pulm rehab who stated pt was there for a session today being seen by Lattie Haw who does the maintenance pulm rehab program.  Per Truddie Crumble, pt's lt leg had increased edema being 3+ edema.  Pt's rt leg also had edema but not nearly as bad as pt's lt leg.  When [facility] asked pt when he first noticed the edema, pt stated it was noticed yesterday, 12/06/17. Pt also stated when he weighed today, his weight was 1/2 pound difference from yesterday with today's weight.  Truddie Crumble also stated pt had some tenderness in his lt leg.  Stated to [place] pt needs to call PCP to get an appt with them to get edema checked out.  Truddie Crumble stated to me an appt was made tomorrow for pt to see SG and I stated to her if pcp feels like pt should keep that appt, we can keep it or if pcp felt like pt did not need to come to our office tomorrow, we could cancel that appt for pt.  Portia expressed understanding. Nothing further needed at this time.

## 2017-12-08 ENCOUNTER — Ambulatory Visit: Payer: Medicare Other | Admitting: Acute Care

## 2017-12-08 ENCOUNTER — Ambulatory Visit (HOSPITAL_BASED_OUTPATIENT_CLINIC_OR_DEPARTMENT_OTHER)
Admission: RE | Admit: 2017-12-08 | Discharge: 2017-12-08 | Disposition: A | Discharge status: Home / Self Care | Payer: Medicare Other | Source: Ambulatory Visit | Attending: Family Medicine | Admitting: Family Medicine

## 2017-12-08 ENCOUNTER — Other Ambulatory Visit (HOSPITAL_BASED_OUTPATIENT_CLINIC_OR_DEPARTMENT_OTHER): Payer: Self-pay | Admitting: Family Medicine

## 2017-12-08 DIAGNOSIS — M7989 Other specified soft tissue disorders: Secondary | ICD-10-CM | POA: Diagnosis present

## 2017-12-09 ENCOUNTER — Encounter (HOSPITAL_COMMUNITY): Payer: Self-pay

## 2017-12-11 ENCOUNTER — Other Ambulatory Visit: Payer: Self-pay | Admitting: Interventional Cardiology

## 2017-12-13 ENCOUNTER — Other Ambulatory Visit: Payer: Self-pay | Admitting: Interventional Cardiology

## 2017-12-14 ENCOUNTER — Encounter (HOSPITAL_COMMUNITY)
Admission: RE | Admit: 2017-12-14 | Discharge: 2017-12-14 | Disposition: A | Discharge status: Home / Self Care | Payer: Self-pay | Source: Ambulatory Visit | Attending: Internal Medicine | Admitting: Internal Medicine

## 2017-12-16 ENCOUNTER — Encounter (HOSPITAL_COMMUNITY)
Admission: RE | Admit: 2017-12-16 | Discharge: 2017-12-16 | Disposition: A | Discharge status: Home / Self Care | Payer: Self-pay | Source: Ambulatory Visit | Attending: Internal Medicine | Admitting: Internal Medicine

## 2017-12-21 ENCOUNTER — Encounter (HOSPITAL_COMMUNITY)
Admission: RE | Admit: 2017-12-21 | Discharge: 2017-12-21 | Disposition: A | Discharge status: Home / Self Care | Payer: Medicare Other | Source: Ambulatory Visit | Attending: Internal Medicine | Admitting: Internal Medicine

## 2017-12-23 ENCOUNTER — Encounter (HOSPITAL_COMMUNITY)
Admission: RE | Admit: 2017-12-23 | Discharge: 2017-12-23 | Disposition: A | Discharge status: Home / Self Care | Payer: Medicare Other | Source: Ambulatory Visit | Attending: Internal Medicine | Admitting: Internal Medicine

## 2017-12-28 ENCOUNTER — Encounter (HOSPITAL_COMMUNITY)
Admission: RE | Admit: 2017-12-28 | Discharge: 2017-12-28 | Disposition: A | Discharge status: Home / Self Care | Payer: Self-pay | Source: Ambulatory Visit | Attending: Internal Medicine | Admitting: Internal Medicine

## 2017-12-30 ENCOUNTER — Encounter (HOSPITAL_COMMUNITY)
Admission: RE | Admit: 2017-12-30 | Discharge: 2017-12-30 | Disposition: A | Discharge status: Home / Self Care | Payer: Self-pay | Source: Ambulatory Visit | Attending: Pulmonary Disease | Admitting: Pulmonary Disease

## 2017-12-30 DIAGNOSIS — I272 Pulmonary hypertension, unspecified: Secondary | ICD-10-CM | POA: Insufficient documentation

## 2017-12-30 DIAGNOSIS — Z7982 Long term (current) use of aspirin: Secondary | ICD-10-CM | POA: Insufficient documentation

## 2017-12-30 DIAGNOSIS — E785 Hyperlipidemia, unspecified: Secondary | ICD-10-CM | POA: Insufficient documentation

## 2017-12-30 DIAGNOSIS — F411 Generalized anxiety disorder: Secondary | ICD-10-CM | POA: Insufficient documentation

## 2017-12-30 DIAGNOSIS — Z79899 Other long term (current) drug therapy: Secondary | ICD-10-CM | POA: Insufficient documentation

## 2017-12-30 DIAGNOSIS — Z87891 Personal history of nicotine dependence: Secondary | ICD-10-CM | POA: Insufficient documentation

## 2017-12-30 DIAGNOSIS — I2510 Atherosclerotic heart disease of native coronary artery without angina pectoris: Secondary | ICD-10-CM | POA: Insufficient documentation

## 2018-01-04 ENCOUNTER — Encounter (HOSPITAL_COMMUNITY)
Admission: RE | Admit: 2018-01-04 | Discharge: 2018-01-04 | Disposition: A | Discharge status: Home / Self Care | Payer: Self-pay | Source: Ambulatory Visit | Attending: Internal Medicine | Admitting: Internal Medicine

## 2018-01-04 NOTE — Progress Notes (Signed)
Patient arrived to Pulmonary Rehab Maintenance today. During our warm up (light stretching) the patient fell backwards into the wall and slid down the wall into the chair. Patient did not report any dizziness or lightheadedness. He was not sure why he lost his balance. Patient stated that he felt fine and continued to exercise on seated equipment. He reports to [employer].

## 2018-01-06 ENCOUNTER — Encounter (HOSPITAL_COMMUNITY): Payer: Self-pay

## 2018-01-10 ENCOUNTER — Ambulatory Visit (INDEPENDENT_AMBULATORY_CARE_PROVIDER_SITE_OTHER): Payer: Medicare Other | Admitting: Pulmonary Disease

## 2018-01-10 ENCOUNTER — Encounter: Payer: Self-pay | Admitting: Pulmonary Disease

## 2018-01-10 VITALS — BP 106/62 | HR 66 | Ht 72.0 in | Wt 168.8 lb

## 2018-01-10 DIAGNOSIS — J9611 Chronic respiratory failure with hypoxia: Secondary | ICD-10-CM | POA: Diagnosis not present

## 2018-01-10 DIAGNOSIS — J440 Chronic obstructive pulmonary disease with acute lower respiratory infection: Secondary | ICD-10-CM

## 2018-01-10 DIAGNOSIS — I272 Pulmonary hypertension, unspecified: Secondary | ICD-10-CM | POA: Diagnosis not present

## 2018-01-10 DIAGNOSIS — J432 Centrilobular emphysema: Secondary | ICD-10-CM | POA: Diagnosis not present

## 2018-01-10 NOTE — Progress Notes (Signed)
Subjective:    Patient ID: [name], male    DOB: [birth year], 72 y.o.   MRN: [id number]  Synopsis: Patient followed by Dr. Lamonte Sakai for years transferred care to [facility] in 2018. Has a past medical history significant for pulmonary hypertension, COPD. 2018 CT scan of the chest showed centrilobular emphysema. He is started [facility] March 2018.  Started Tyvaso February 2019 but he had to hold this due to fatigue.  Sildenafil 20 mg 3 times daily was started May 2019  HPI Chief Complaint  Patient presents with  . Follow-up    3 mo ROV    He tried taking Tyvaso and it made him feel more tired and his O2 level dropped. So he stopped.  He hasn't picked up the sildenafil yet. He has been taking furosemide daily now for about a week.   He wonders if he has a predisoposition for low O2 saturation.  He doesn't feel more short of breath necessarily when his O2 saturation.  Past Medical History:  Diagnosis Date  . CAD (coronary artery disease)   . GAD (generalized anxiety disorder)   . Hyperlipidemia   . Overweight(278.02)   . Pulmonary hypertension ([place])        Review of Systems  Constitutional: Negative for fever and unexpected weight change.  HENT: Negative for congestion, dental problem, ear pain, nosebleeds, postnasal drip, rhinorrhea, sinus pressure, sneezing, sore throat and trouble swallowing.   Eyes: Negative for redness and itching.  Respiratory: Positive for shortness of breath. Negative for cough, chest tightness and wheezing.   Cardiovascular: Negative for palpitations and leg swelling.  Gastrointestinal: Negative for nausea and vomiting.  Genitourinary: Negative for dysuria.  Musculoskeletal: Negative for joint swelling.  Skin: Negative for rash.  Allergic/Immunologic: Negative.  Negative for environmental allergies, food allergies and immunocompromised state.  Neurological: Negative for headaches.  Hematological: Does not bruise/bleed easily.  Psychiatric/Behavioral:  Negative for dysphoric mood. The patient is not nervous/anxious.        Objective:   Physical Exam Vitals:   01/10/18 0954  BP: 106/62  Pulse: 66  SpO2: 90%  Weight: 168 lb 12.8 oz (76.6 kg)  Height: 6' (1.829 m)   2L O2 started at 72% after he walked in, it increased to 90% after sitting and resting  Gen: chronically ill appearing HENT: OP clear, TM's clear, neck supple PULM: Poor air movement B, normal percussion CV: RRR, no mgr, trace edema GI: BS+, soft, nontender Derm: no cyanosis or rash Psyche: normal mood and affect     Chest imaging: April 2018 CT chest images independently reviewed showing moderate to severe centrilobular emphysema, 3 mm right upper lobe solid pulmonary nodule. Emphysema is upper lobe predominant April 2018 VQ scan showed some mismatch in the upper lobes where he has emphysema but otherwise no worrisome findings for chronic, embolic pulmonary hypertension  Echo: February 2018 LVEF 60-65% with grade 1 diastolic dysfunction RV mildly dilated with normal function. 05/2017 LVEF 60-65%, RV mildly dilated, RVSP 12mHg May 2019 [facility] normal LV function, mild RV dysfunction, RVSP 74 mmHg   PFT: January 2018: Ratio 65%, FEV1 2.96 L 85% predicted, 13 % change post bronchodilator, FVC 4.5 L 96% predicted, total lung capacity 6.84 L 92% predicted, DLCO 8.5 324% predicted November 2018: Ratio 98%, FEV1 97% predicted, total lung capacity 103% predicted, DLCO 30.8% predicted  6-minute walk test: May 2019 [facility] 327 m O2 saturation nadir to 72% on 15 L nasal cannula.  Heart Cath: 10/2016 RHC: RA 4,  RV 78/4, PA 79/23 (14), PCW 4, Fick 5.2/2.5 10/2016 LHC:   Mild to moderate left sided CAD.  Mid RCA lesion, 100 %stenosed. 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  Ost LM to LM lesion, 20 %stenosed.  The left ventricular systolic function is normal.  LV end diastolic pressure is normal.  08/27/2018 RHC [facility]: Cardiac Index  (l/min/m2) 3.0 L/min/m2  Right Atrium Mean Pressure (mmHg) 3 mmHg  Right Ventricle Systolic Pressure (mmHg) 68 mmHg  Pulmonary Artery Mean Pressure (mmHg) 39 mmHg  Pulmonary Wedge Pressure (mmHg) 5 mmHg  Pulmonary Vascular Resistance (Wood units) 5.7 Wood units   Records from his visit with the [facility] cardiology and pulmonary vascular disease clinic reviewed from May 2019.  Sildenafil was started, Lasix was started O2 saturation adjusted to 8 lpm.     Assessment & Plan:   Centrilobular emphysema ([place])  Pulmonary hypertension (HCC)  Chronic respiratory failure with hypoxia (HCC)  COPD with acute lower respiratory infection ([place])  Discussion: 6-minute walk distance testing, ambulatory oxygenation numbers, and echocardiogram findings performed recently would suggest that the pulmonary hypertension is worsening.  He says that he feels okay which is a good thing.  There is certainly been no exacerbations of his COPD recently.  Today we talked about the importance of using an appropriate amount of oxygen when he is out doing regular activities.  It should be noted that today in clinic his O2 saturation at rest was 90% on 2 L nasal cannula.  We used a device that measures his O2 saturation from his forehead.  Plan: Chronic respiratory failure with hypoxemia: Keep using 2 L of oxygen at rest, 4 L with minimal exertion, and 15 L with heavy exertion at pulmonary rehab We will make arrangements to have an arterial blood sample sent for co-oximetry where we will test for methemoglobin anemia, sulfhemoglobin anemia and other hemoglobin abnormalities.  Pulmonary hypertension: Continue OPSUMIT as directed by cardiology clinic Start taking sildenafil as directed by the cardiology clinic Continue furosemide daily  COPD:  Continue taking Trelegy daily  We will see you back in 3 months or sooner if needed  > 50% of this 25 minute visit spent face to face   Current Outpatient Medications:  .   aspirin 81 MG tablet, Take 81 mg by mouth every evening. , Disp: , Rfl:  .  Azelastine-Fluticasone (DYMISTA) 137-50 MCG/ACT SUSP, Place 1-2 sprays into the nose daily., Disp: 1 Bottle, Rfl: 5 .  clonazePAM (KLONOPIN) 0.5 MG tablet, Take 0.5 mg 3 (three) times daily as needed by mouth for anxiety. , Disp: , Rfl: 0 .  Coenzyme Q10 (VITALINE COQ10) 100 MG WAFR, Take 60 mg by mouth daily., Disp: , Rfl:  .  DULoxetine (CYMBALTA) 30 MG capsule, TK 1 TO 2 CS PO QD, Disp: , Rfl:  .  ezetimibe (ZETIA) 10 MG tablet, Take 1 tablet (10 mg total) by mouth every evening., Disp: 90 tablet, Rfl: 3 .  Fluticasone-Umeclidin-Vilant (TRELEGY ELLIPTA) 100-62.5-25 MCG/INH AEPB, Inhale 1 puff into the lungs daily., Disp: 60 each, Rfl: 5 .  Lactobacillus (ACIDOPHILUS PO), Take 1 capsule by mouth daily. , Disp: , Rfl:  .  Macitentan (OPSUMIT) 10 MG TABS, Take 1 tablet (10 mg total) by mouth daily., Disp: 30 tablet, Rfl: 11 .  mirtazapine (REMERON) 15 MG tablet, Take 15 mg by mouth at bedtime., Disp: , Rfl:  .  Multiple Vitamins-Minerals (CENTRUM CARDIO PO), Take 1 tablet by mouth daily. , Disp: ,  Rfl:  .  Omega-3 Fatty Acids (OMEGA 3 PO), Take 3,200 mg by mouth 2 (two) times daily. Nordic Natural ultimate Omega 3 , Disp: , Rfl:  .  rosuvastatin (CRESTOR) 40 MG tablet, TAKE 1 TABLET BY MOUTH DAILY, Disp: 90 tablet, Rfl: 0

## 2018-01-10 NOTE — Patient Instructions (Signed)
Chronic respiratory failure with hypoxemia: Keep using 2 L of oxygen at rest, 4 L with minimal exertion, and 15 L with heavy exertion at pulmonary rehab We will make arrangements to have an arterial blood sample sent for co-oximetry where we will test for methemoglobin anemia, sulfhemoglobin anemia and other hemoglobin abnormalities.  Pulmonary hypertension: Continue OPSUMIT as directed by cardiology clinic Start taking sildenafil as directed by the cardiology clinic Continue furosemide daily  COPD:  Continue taking Trelegy daily  We will see you back in 3 months or sooner if needed

## 2018-01-11 ENCOUNTER — Encounter (HOSPITAL_COMMUNITY)
Admission: RE | Admit: 2018-01-11 | Discharge: 2018-01-11 | Disposition: A | Discharge status: Home / Self Care | Payer: Self-pay | Source: Ambulatory Visit | Attending: Internal Medicine | Admitting: Internal Medicine

## 2018-01-13 ENCOUNTER — Encounter (HOSPITAL_COMMUNITY)
Admission: RE | Admit: 2018-01-13 | Discharge: 2018-01-13 | Disposition: A | Discharge status: Home / Self Care | Payer: Medicare Other | Source: Ambulatory Visit | Attending: Internal Medicine | Admitting: Internal Medicine

## 2018-01-18 ENCOUNTER — Encounter (HOSPITAL_COMMUNITY)
Admission: RE | Admit: 2018-01-18 | Discharge: 2018-01-18 | Disposition: A | Discharge status: Home / Self Care | Payer: Self-pay | Source: Ambulatory Visit | Attending: Internal Medicine | Admitting: Internal Medicine

## 2018-01-20 ENCOUNTER — Encounter (HOSPITAL_COMMUNITY)
Admission: RE | Admit: 2018-01-20 | Discharge: 2018-01-20 | Disposition: A | Discharge status: Home / Self Care | Payer: Self-pay | Source: Ambulatory Visit | Attending: Internal Medicine | Admitting: Internal Medicine

## 2018-01-24 ENCOUNTER — Other Ambulatory Visit: Payer: Self-pay | Admitting: Interventional Cardiology

## 2018-01-25 ENCOUNTER — Encounter (HOSPITAL_COMMUNITY): Payer: Self-pay

## 2018-01-25 ENCOUNTER — Other Ambulatory Visit: Payer: Self-pay | Admitting: Interventional Cardiology

## 2018-01-27 ENCOUNTER — Encounter (HOSPITAL_COMMUNITY)
Admission: RE | Admit: 2018-01-27 | Discharge: 2018-01-27 | Disposition: A | Discharge status: Home / Self Care | Payer: Medicare Other | Source: Ambulatory Visit | Attending: Internal Medicine | Admitting: Internal Medicine

## 2018-02-01 ENCOUNTER — Encounter (HOSPITAL_COMMUNITY)
Admission: RE | Admit: 2018-02-01 | Discharge: 2018-02-01 | Disposition: A | Discharge status: Home / Self Care | Payer: Self-pay | Source: Ambulatory Visit | Attending: Pulmonary Disease | Admitting: Pulmonary Disease

## 2018-02-01 DIAGNOSIS — I2510 Atherosclerotic heart disease of native coronary artery without angina pectoris: Secondary | ICD-10-CM | POA: Insufficient documentation

## 2018-02-01 DIAGNOSIS — Z79899 Other long term (current) drug therapy: Secondary | ICD-10-CM | POA: Insufficient documentation

## 2018-02-01 DIAGNOSIS — E785 Hyperlipidemia, unspecified: Secondary | ICD-10-CM | POA: Insufficient documentation

## 2018-02-01 DIAGNOSIS — Z87891 Personal history of nicotine dependence: Secondary | ICD-10-CM | POA: Insufficient documentation

## 2018-02-01 DIAGNOSIS — Z7982 Long term (current) use of aspirin: Secondary | ICD-10-CM | POA: Insufficient documentation

## 2018-02-01 DIAGNOSIS — I272 Pulmonary hypertension, unspecified: Secondary | ICD-10-CM | POA: Insufficient documentation

## 2018-02-01 DIAGNOSIS — F411 Generalized anxiety disorder: Secondary | ICD-10-CM | POA: Insufficient documentation

## 2018-02-03 ENCOUNTER — Encounter (HOSPITAL_COMMUNITY)
Admission: RE | Admit: 2018-02-03 | Discharge: 2018-02-03 | Disposition: A | Discharge status: Home / Self Care | Payer: Self-pay | Source: Ambulatory Visit | Attending: Internal Medicine | Admitting: Internal Medicine

## 2018-02-04 ENCOUNTER — Encounter: Payer: Self-pay | Admitting: Pulmonary Disease

## 2018-02-08 ENCOUNTER — Encounter (HOSPITAL_COMMUNITY)
Admission: RE | Admit: 2018-02-08 | Discharge: 2018-02-08 | Disposition: A | Discharge status: Home / Self Care | Payer: Self-pay | Source: Ambulatory Visit | Attending: Internal Medicine | Admitting: Internal Medicine

## 2018-02-10 ENCOUNTER — Encounter (HOSPITAL_COMMUNITY)
Admission: RE | Admit: 2018-02-10 | Discharge: 2018-02-10 | Disposition: A | Discharge status: Home / Self Care | Payer: Self-pay | Source: Ambulatory Visit | Attending: Internal Medicine | Admitting: Internal Medicine

## 2018-02-15 ENCOUNTER — Encounter (HOSPITAL_COMMUNITY)
Admission: RE | Admit: 2018-02-15 | Discharge: 2018-02-15 | Disposition: A | Discharge status: Home / Self Care | Payer: Self-pay | Source: Ambulatory Visit | Attending: Internal Medicine | Admitting: Internal Medicine

## 2018-02-17 ENCOUNTER — Encounter (HOSPITAL_COMMUNITY)
Admission: RE | Admit: 2018-02-17 | Discharge: 2018-02-17 | Disposition: A | Discharge status: Home / Self Care | Payer: Self-pay | Source: Ambulatory Visit | Attending: Internal Medicine | Admitting: Internal Medicine

## 2018-02-22 ENCOUNTER — Encounter (HOSPITAL_COMMUNITY)
Admission: RE | Admit: 2018-02-22 | Discharge: 2018-02-22 | Disposition: A | Discharge status: Home / Self Care | Payer: Medicare Other | Source: Ambulatory Visit | Attending: Internal Medicine | Admitting: Internal Medicine

## 2018-02-23 ENCOUNTER — Other Ambulatory Visit: Payer: Self-pay | Admitting: Interventional Cardiology

## 2018-02-24 ENCOUNTER — Encounter (HOSPITAL_COMMUNITY): Payer: Self-pay

## 2018-02-28 ENCOUNTER — Other Ambulatory Visit: Payer: Self-pay | Admitting: Interventional Cardiology

## 2018-03-01 ENCOUNTER — Encounter (HOSPITAL_COMMUNITY)
Admission: RE | Admit: 2018-03-01 | Discharge: 2018-03-01 | Disposition: A | Discharge status: Home / Self Care | Payer: Self-pay | Source: Ambulatory Visit | Attending: Pulmonary Disease | Admitting: Pulmonary Disease

## 2018-03-01 DIAGNOSIS — Z79899 Other long term (current) drug therapy: Secondary | ICD-10-CM | POA: Insufficient documentation

## 2018-03-01 DIAGNOSIS — Z7982 Long term (current) use of aspirin: Secondary | ICD-10-CM | POA: Insufficient documentation

## 2018-03-01 DIAGNOSIS — I272 Pulmonary hypertension, unspecified: Secondary | ICD-10-CM | POA: Insufficient documentation

## 2018-03-01 DIAGNOSIS — E785 Hyperlipidemia, unspecified: Secondary | ICD-10-CM | POA: Insufficient documentation

## 2018-03-01 DIAGNOSIS — F411 Generalized anxiety disorder: Secondary | ICD-10-CM | POA: Insufficient documentation

## 2018-03-01 DIAGNOSIS — Z87891 Personal history of nicotine dependence: Secondary | ICD-10-CM | POA: Insufficient documentation

## 2018-03-01 DIAGNOSIS — I2510 Atherosclerotic heart disease of native coronary artery without angina pectoris: Secondary | ICD-10-CM | POA: Insufficient documentation

## 2018-03-01 MED ORDER — ROSUVASTATIN CALCIUM 40 MG PO TABS: 40.0000 mg | ORAL_TABLET | Freq: Every day | ORAL | 0 refills | Status: DC

## 2018-03-01 MED ORDER — EZETIMIBE 10 MG PO TABS: 10.0000 mg | ORAL_TABLET | Freq: Every day | ORAL | 0 refills | Status: DC

## 2018-03-08 ENCOUNTER — Encounter (HOSPITAL_COMMUNITY)
Admission: RE | Admit: 2018-03-08 | Discharge: 2018-03-08 | Disposition: A | Discharge status: Home / Self Care | Payer: Self-pay | Source: Ambulatory Visit | Attending: Internal Medicine | Admitting: Internal Medicine

## 2018-03-10 ENCOUNTER — Encounter (HOSPITAL_COMMUNITY)
Admission: RE | Admit: 2018-03-10 | Discharge: 2018-03-10 | Disposition: A | Discharge status: Home / Self Care | Payer: Self-pay | Source: Ambulatory Visit | Attending: Internal Medicine | Admitting: Internal Medicine

## 2018-03-15 ENCOUNTER — Encounter (HOSPITAL_COMMUNITY)
Admission: RE | Admit: 2018-03-15 | Discharge: 2018-03-15 | Disposition: A | Discharge status: Home / Self Care | Payer: Self-pay | Source: Ambulatory Visit | Attending: Internal Medicine | Admitting: Internal Medicine

## 2018-03-17 ENCOUNTER — Encounter (HOSPITAL_COMMUNITY)
Admission: RE | Admit: 2018-03-17 | Discharge: 2018-03-17 | Disposition: A | Discharge status: Home / Self Care | Payer: Self-pay | Source: Ambulatory Visit | Attending: Internal Medicine | Admitting: Internal Medicine

## 2018-03-22 ENCOUNTER — Encounter (HOSPITAL_COMMUNITY)
Admission: RE | Admit: 2018-03-22 | Discharge: 2018-03-22 | Disposition: A | Discharge status: Home / Self Care | Payer: Self-pay | Source: Ambulatory Visit | Attending: Internal Medicine | Admitting: Internal Medicine

## 2018-03-24 ENCOUNTER — Encounter (HOSPITAL_COMMUNITY)
Admission: RE | Admit: 2018-03-24 | Discharge: 2018-03-24 | Disposition: A | Discharge status: Home / Self Care | Payer: Self-pay | Source: Ambulatory Visit | Attending: Internal Medicine | Admitting: Internal Medicine

## 2018-03-29 ENCOUNTER — Encounter (HOSPITAL_COMMUNITY)
Admission: RE | Admit: 2018-03-29 | Discharge: 2018-03-29 | Disposition: A | Discharge status: Home / Self Care | Payer: Self-pay | Source: Ambulatory Visit | Attending: Internal Medicine | Admitting: Internal Medicine

## 2018-03-31 ENCOUNTER — Encounter (HOSPITAL_COMMUNITY)
Admission: RE | Admit: 2018-03-31 | Discharge: 2018-03-31 | Disposition: A | Discharge status: Home / Self Care | Payer: Self-pay | Source: Ambulatory Visit | Attending: Pulmonary Disease | Admitting: Pulmonary Disease

## 2018-03-31 DIAGNOSIS — F411 Generalized anxiety disorder: Secondary | ICD-10-CM | POA: Insufficient documentation

## 2018-03-31 DIAGNOSIS — Z7982 Long term (current) use of aspirin: Secondary | ICD-10-CM | POA: Insufficient documentation

## 2018-03-31 DIAGNOSIS — I272 Pulmonary hypertension, unspecified: Secondary | ICD-10-CM | POA: Insufficient documentation

## 2018-03-31 DIAGNOSIS — Z87891 Personal history of nicotine dependence: Secondary | ICD-10-CM | POA: Insufficient documentation

## 2018-03-31 DIAGNOSIS — Z79899 Other long term (current) drug therapy: Secondary | ICD-10-CM | POA: Insufficient documentation

## 2018-03-31 DIAGNOSIS — I2510 Atherosclerotic heart disease of native coronary artery without angina pectoris: Secondary | ICD-10-CM | POA: Insufficient documentation

## 2018-03-31 DIAGNOSIS — E785 Hyperlipidemia, unspecified: Secondary | ICD-10-CM | POA: Insufficient documentation

## 2018-04-05 ENCOUNTER — Encounter (HOSPITAL_COMMUNITY)
Admission: RE | Admit: 2018-04-05 | Discharge: 2018-04-05 | Disposition: A | Discharge status: Home / Self Care | Payer: Self-pay | Source: Ambulatory Visit | Attending: Pulmonary Disease | Admitting: Pulmonary Disease

## 2018-04-07 ENCOUNTER — Encounter (HOSPITAL_COMMUNITY)
Admission: RE | Admit: 2018-04-07 | Discharge: 2018-04-07 | Disposition: A | Discharge status: Home / Self Care | Payer: Self-pay | Source: Ambulatory Visit | Attending: Pulmonary Disease | Admitting: Pulmonary Disease

## 2018-04-12 ENCOUNTER — Encounter (HOSPITAL_COMMUNITY)
Admission: RE | Admit: 2018-04-12 | Discharge: 2018-04-12 | Disposition: A | Discharge status: Home / Self Care | Payer: Self-pay | Source: Ambulatory Visit | Attending: Pulmonary Disease | Admitting: Pulmonary Disease

## 2018-04-14 ENCOUNTER — Encounter (HOSPITAL_COMMUNITY)
Admission: RE | Admit: 2018-04-14 | Discharge: 2018-04-14 | Disposition: A | Discharge status: Home / Self Care | Payer: Self-pay | Source: Ambulatory Visit | Attending: Pulmonary Disease | Admitting: Pulmonary Disease

## 2018-04-15 IMAGING — DX DG CHEST 2V
2 series · 2 of 2 positions shown · non-contrast
Comparison: CT 12/24/2016.  Chest x-ray 12/24/2016.

CLINICAL DATA: Emphysema.

EXAM:
CHEST  2 VIEW

[chest pa]
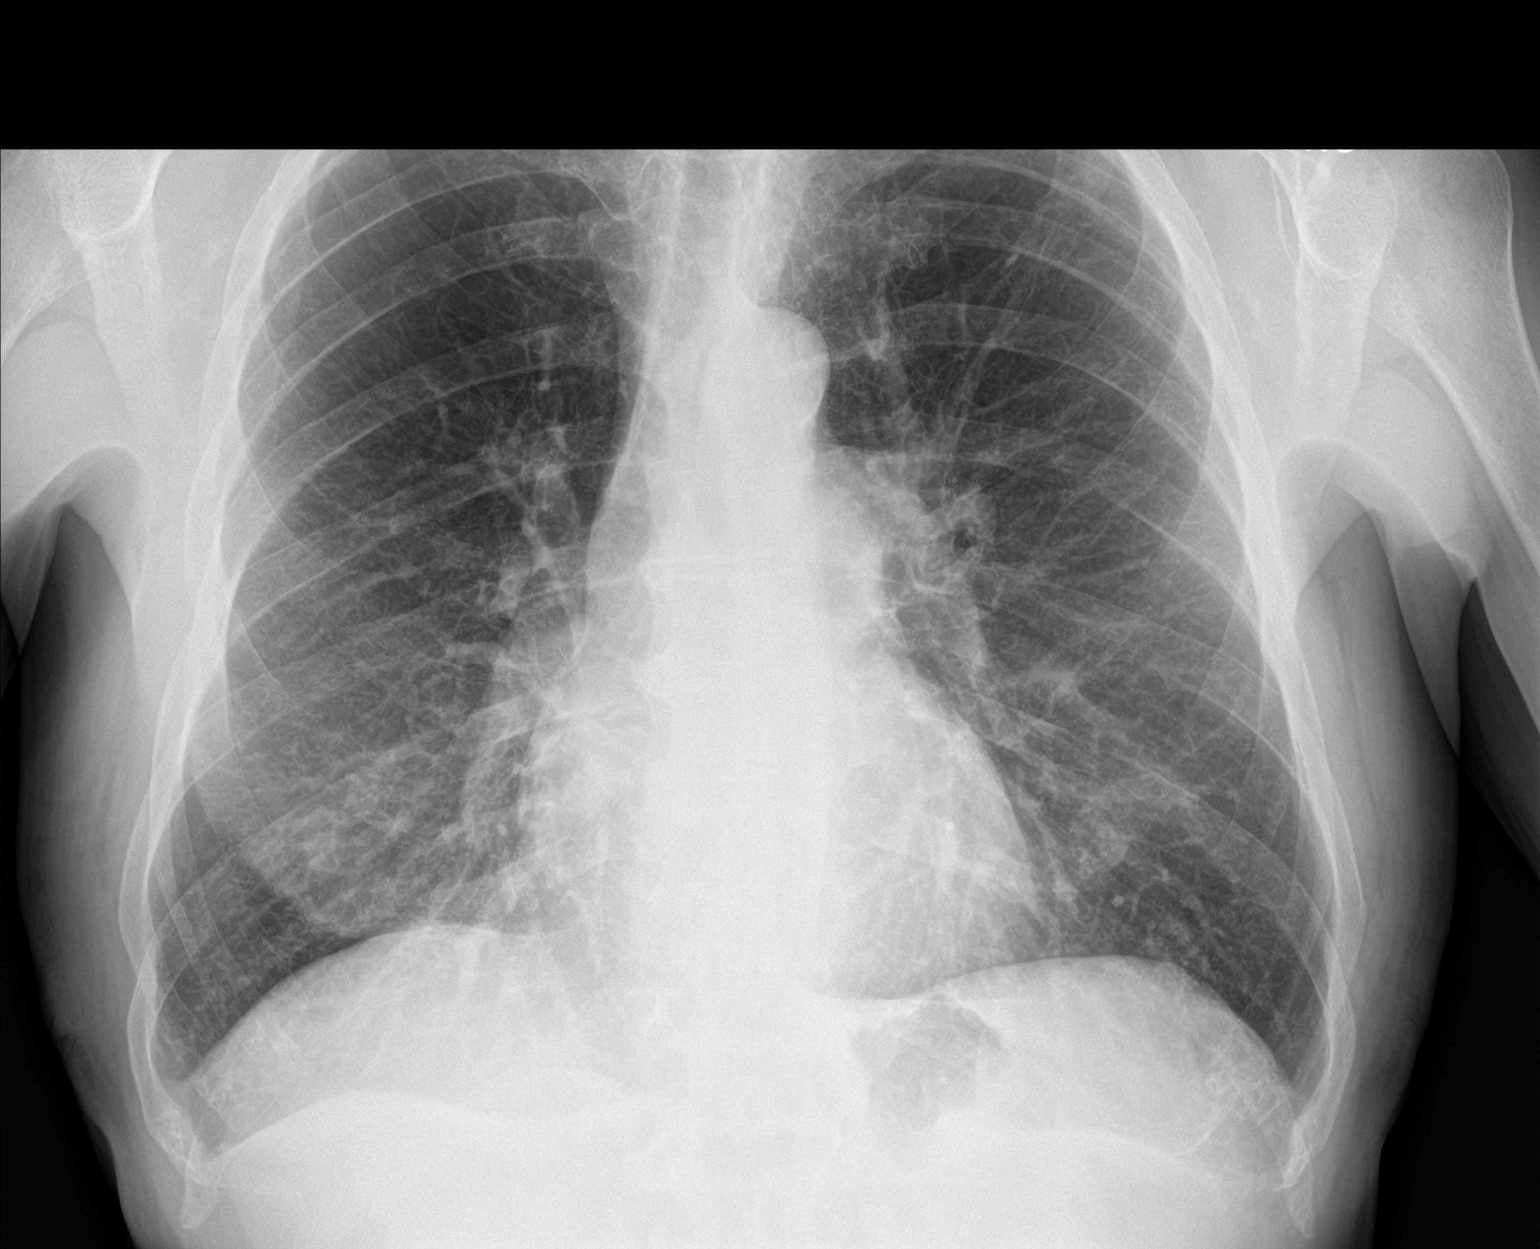

[chest lat]
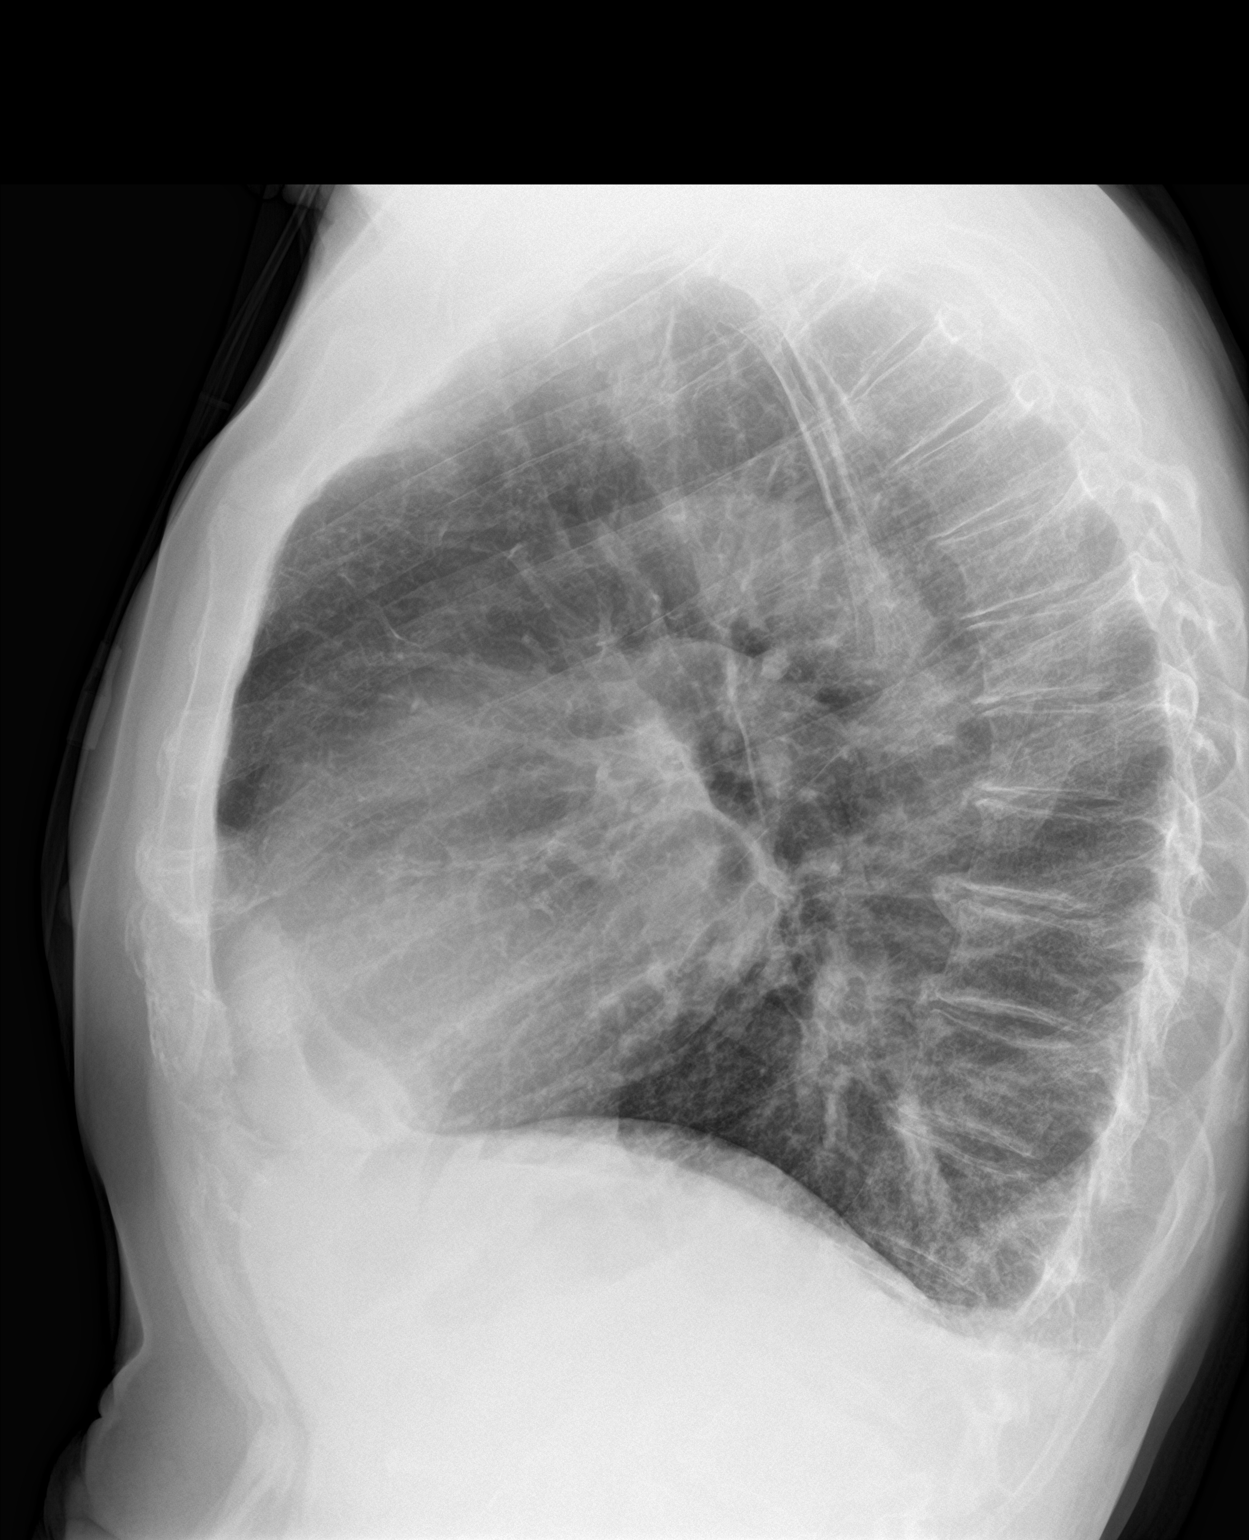

[2 of 2 positions shown; findings below may reference images not displayed]

FINDINGS: Mediastinum and hilar structures are normal. Heart size normal.
Stable bilateral interstitial prominence consistent with chronic
interstitial lung disease. No acute infiltrate. COPD. No pleural
effusion or pneumothorax .
IMPRESSION: Chronic interstitial lung disease. COPD . No acute cardiopulmonary
disease.

## 2018-04-19 ENCOUNTER — Encounter (HOSPITAL_COMMUNITY)
Admission: RE | Admit: 2018-04-19 | Discharge: 2018-04-19 | Disposition: A | Discharge status: Home / Self Care | Payer: Self-pay | Source: Ambulatory Visit | Attending: Pulmonary Disease | Admitting: Pulmonary Disease

## 2018-04-19 ENCOUNTER — Encounter: Payer: Self-pay | Admitting: Pulmonary Disease

## 2018-04-19 ENCOUNTER — Ambulatory Visit (INDEPENDENT_AMBULATORY_CARE_PROVIDER_SITE_OTHER): Payer: Medicare Other | Admitting: Pulmonary Disease

## 2018-04-19 VITALS — BP 112/60 | HR 71 | Ht 70.55 in | Wt 166.0 lb

## 2018-04-19 DIAGNOSIS — I272 Pulmonary hypertension, unspecified: Secondary | ICD-10-CM

## 2018-04-19 DIAGNOSIS — J300 Vasomotor rhinitis: Secondary | ICD-10-CM

## 2018-04-19 DIAGNOSIS — J9611 Chronic respiratory failure with hypoxia: Secondary | ICD-10-CM | POA: Diagnosis not present

## 2018-04-19 DIAGNOSIS — J432 Centrilobular emphysema: Secondary | ICD-10-CM

## 2018-04-19 MED ORDER — IPRATROPIUM BROMIDE 0.06 % NA SOLN: 2.0000 | Freq: Four times a day (QID) | NASAL | 5 refills | Status: AC | PRN

## 2018-04-19 NOTE — Progress Notes (Signed)
Subjective:    Patient ID: [name], male    DOB: [birth year], 72 y.o.   MRN: [id number]  Synopsis: Patient followed by Dr. Lamonte Sakai for years transferred care to [facility] in 2018. Has a past medical history significant for pulmonary hypertension, COPD. 2018 CT scan of the chest showed centrilobular emphysema. He is started [facility] March 2018.  Started Tyvaso February 2019 but he had to hold this due to fatigue.  Sildenafil 20 mg 3 times daily was started May 2019  HPI Chief Complaint  Patient presents with  . Follow-up   Renly has been doing fairly well since the last visit.  He has not had any problems with bronchitis or a respiratory infection.  He says that he has been tolerating the OPSUMIT and sildenafil and he feels that his exercise tolerance has improved.  He is capable of exercising on the bike and treadmill with oxygen set at 25 L/min and his O2 saturation will remain around 92 to 94%.  He says that the physicians at [facility] are planning to increase the dose later this year.  (Sildenafil).  He continues to take Trelegy every day and has no difficulty from it.  He does note a continuous drainage from his nose typically in the mornings when he eats breakfast.  He says that he has tried varying his timing of medications and eating but this does not seem to make any difference.  It is a clear, watery drainage which is near continuous and flow.  Past Medical History:  Diagnosis Date  . CAD (coronary artery disease)   . GAD (generalized anxiety disorder)   . Hyperlipidemia   . Overweight(278.02)   . Pulmonary hypertension ([place])        Review of Systems  Constitutional: Negative for fever and unexpected weight change.  HENT: Negative for congestion, dental problem, ear pain, nosebleeds, postnasal drip, rhinorrhea, sinus pressure, sneezing, sore throat and trouble swallowing.   Eyes: Negative for redness and itching.  Respiratory: Positive for shortness of breath. Negative for  cough, chest tightness and wheezing.   Cardiovascular: Negative for palpitations and leg swelling.  Gastrointestinal: Negative for nausea and vomiting.  Genitourinary: Negative for dysuria.  Musculoskeletal: Negative for joint swelling.  Skin: Negative for rash.  Allergic/Immunologic: Negative.  Negative for environmental allergies, food allergies and immunocompromised state.  Neurological: Negative for headaches.  Hematological: Does not bruise/bleed easily.  Psychiatric/Behavioral: Negative for dysphoric mood. The patient is not nervous/anxious.        Objective:   Physical Exam Vitals:   04/19/18 1406  BP: 112/60  Pulse: 71  SpO2: 96%  Weight: 166 lb (75.3 kg)  Height: 5' 10.55" (1.792 m)   2.5L continuous flow  Gen: chronically ill appearing HENT: OP clear, TM's clear, neck supple PULM: Few crackles bases B, normal percussion CV: RRR, no mgr, trace edema GI: BS+, soft, nontender Derm: no cyanosis or rash Psyche: normal mood and affect     Chest imaging: April 2018 CT chest images independently reviewed showing moderate to severe centrilobular emphysema, 3 mm right upper lobe solid pulmonary nodule. Emphysema is upper lobe predominant April 2018 VQ scan showed some mismatch in the upper lobes where he has emphysema but otherwise no worrisome findings for chronic, embolic pulmonary hypertension  Echo: February 2018 LVEF 60-65% with grade 1 diastolic dysfunction RV mildly dilated with normal function. 05/2017 LVEF 60-65%, RV mildly dilated, RVSP 49mHg May 2019 [facility] normal LV function, mild RV dysfunction, RVSP 74 mmHg  PFT: January 2018: Ratio 65%, FEV1 2.96 L 85% predicted, 13 % change post bronchodilator, FVC 4.5 L 96% predicted, total lung capacity 6.84 L 92% predicted, DLCO 8.5 324% predicted November 2018: Ratio 98%, FEV1 97% predicted, total lung capacity 103% predicted, DLCO 30.8% predicted  6-minute walk test: May 2019 [facility] 327 m O2 saturation nadir to 72%  on 15 L nasal cannula.  Heart Cath: 10/2016 RHC: RA 4, RV 78/4, PA 79/23 (43), PCW 4, Fick 5.2/2.5 10/2016 LHC:   Mild to moderate left sided CAD.  Mid RCA lesion, 100 %stenosed. 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  Ost LM to LM lesion, 20 %stenosed.  The left ventricular systolic function is normal.  LV end diastolic pressure is normal.  08/27/2018 RHC [facility]: Cardiac Index (l/min/m2) 3.0 L/min/m2  Right Atrium Mean Pressure (mmHg) 3 mmHg  Right Ventricle Systolic Pressure (mmHg) 68 mmHg  Pulmonary Artery Mean Pressure (mmHg) 39 mmHg  Pulmonary Wedge Pressure (mmHg) 5 mmHg  Pulmonary Vascular Resistance (Wood units) 5.7 Wood units   Records from his visit with the [place] cardiology and pulmonary vascular disease clinic reviewed from June 2019, no changes were made to his medication regimen, plans were made for a repeat TTE.      Assessment & Plan:   Pulmonary hypertension ([place]) - Plan: Ambulatory Referral for DME  Centrilobular emphysema ([place])  Chronic respiratory failure with hypoxia (HCC)  Vasomotor rhinitis  Discussion: I have seen and examined Mr. plane today, he seems to be stable.  The doses of the endothelium receptor antagonist and PDE 5 agonist seem to be working fairly well.  He has not had a flare of his COPD since the last visit and he remains compliant with his exercise routine and oxygen use.  Plan: Centrilobular emphysema: Continue Trelegy daily Practice good hand hygiene Stay active I am glad you have already had a flu shot  Chronic respiratory failure with hypoxemia: Continue 2-1/2 L with rest, up to 25 L with exercise.  For pulmonary hypertension Continue opsumit and sildenafil as directed by the pulmonary vascular center at [facility]  Vasomotor rhinitis: We will prescribe an oxygen humidifier to see if this helps However, if that is not helpful then take the ipratropium nose spray we prescribed today  We will see  you back in 4 months or sooner if needed  > 50% of this 26 minute visit spent face to face   Current Outpatient Medications:  .  aspirin 81 MG tablet, Take 81 mg by mouth every evening. , Disp: , Rfl:  .  Azelastine-Fluticasone (DYMISTA) 137-50 MCG/ACT SUSP, Place 1-2 sprays into the nose daily., Disp: 1 Bottle, Rfl: 5 .  clonazePAM (KLONOPIN) 0.5 MG tablet, Take 0.5 mg 3 (three) times daily as needed by mouth for anxiety. , Disp: , Rfl: 0 .  Coenzyme Q10 (VITALINE COQ10) 100 MG WAFR, Take 60 mg by mouth daily., Disp: , Rfl:  .  DULoxetine (CYMBALTA) 30 MG capsule, TK 1 TO 2 CS PO QD, Disp: , Rfl:  .  ezetimibe (ZETIA) 10 MG tablet, Take 1 tablet (10 mg total) by mouth daily., Disp: 90 tablet, Rfl: 0 .  Fluticasone-Umeclidin-Vilant (TRELEGY ELLIPTA) 100-62.5-25 MCG/INH AEPB, Inhale 1 puff into the lungs daily., Disp: 60 each, Rfl: 5 .  Lactobacillus (ACIDOPHILUS PO), Take 1 capsule by mouth daily. , Disp: , Rfl:  .  Macitentan (OPSUMIT) 10 MG TABS, Take 1 tablet (10 mg total) by mouth daily., Disp: 30 tablet,  Rfl: 11 .  mirtazapine (REMERON) 15 MG tablet, Take 15 mg by mouth at bedtime., Disp: , Rfl:  .  Multiple Vitamins-Minerals (CENTRUM CARDIO PO), Take 1 tablet by mouth daily. , Disp: , Rfl:  .  Omega-3 Fatty Acids (OMEGA 3 PO), Take 3,200 mg by mouth 2 (two) times daily. [employer] Natural ultimate Omega 3 , Disp: , Rfl:  .  rosuvastatin (CRESTOR) 40 MG tablet, Take 1 tablet (40 mg total) by mouth daily., Disp: 90 tablet, Rfl: 0 .  sildenafil (REVATIO) 20 MG tablet, Take 20 mg by mouth 3 (three) times daily. , Disp: , Rfl:  .  ipratropium (ATROVENT) 0.06 % nasal spray, Place 2 sprays into both nostrils 4 (four) times daily as needed for rhinitis., Disp: 15 mL, Rfl: 5

## 2018-04-19 NOTE — Patient Instructions (Signed)
Centrilobular emphysema: Continue Trelegy daily Practice good hand hygiene Stay active I am glad you have already had a flu shot  Chronic respiratory failure with hypoxemia: Continue 2-1/2 L with rest, up to 25 L with exercise.  Vasomotor rhinitis: We will prescribe an oxygen humidifier to see if this helps However, if that is not helpful then take the ipratropium nose spray we prescribed today  We will see you back in 4 months or sooner if needed

## 2018-04-21 ENCOUNTER — Encounter (HOSPITAL_COMMUNITY)
Admission: RE | Admit: 2018-04-21 | Discharge: 2018-04-21 | Disposition: A | Discharge status: Home / Self Care | Payer: Self-pay | Source: Ambulatory Visit | Attending: Pulmonary Disease | Admitting: Pulmonary Disease

## 2018-04-26 ENCOUNTER — Encounter (HOSPITAL_COMMUNITY)
Admission: RE | Admit: 2018-04-26 | Discharge: 2018-04-26 | Disposition: A | Discharge status: Home / Self Care | Payer: Self-pay | Source: Ambulatory Visit | Attending: Pulmonary Disease | Admitting: Pulmonary Disease

## 2018-05-03 ENCOUNTER — Encounter (HOSPITAL_COMMUNITY)
Admission: RE | Admit: 2018-05-03 | Discharge: 2018-05-03 | Disposition: A | Discharge status: Home / Self Care | Payer: Self-pay | Source: Ambulatory Visit | Attending: Pulmonary Disease | Admitting: Pulmonary Disease

## 2018-05-03 DIAGNOSIS — E785 Hyperlipidemia, unspecified: Secondary | ICD-10-CM | POA: Insufficient documentation

## 2018-05-03 DIAGNOSIS — I272 Pulmonary hypertension, unspecified: Secondary | ICD-10-CM | POA: Insufficient documentation

## 2018-05-03 DIAGNOSIS — Z87891 Personal history of nicotine dependence: Secondary | ICD-10-CM | POA: Insufficient documentation

## 2018-05-03 DIAGNOSIS — Z7982 Long term (current) use of aspirin: Secondary | ICD-10-CM | POA: Insufficient documentation

## 2018-05-03 DIAGNOSIS — Z79899 Other long term (current) drug therapy: Secondary | ICD-10-CM | POA: Insufficient documentation

## 2018-05-03 DIAGNOSIS — F411 Generalized anxiety disorder: Secondary | ICD-10-CM | POA: Insufficient documentation

## 2018-05-03 DIAGNOSIS — I2510 Atherosclerotic heart disease of native coronary artery without angina pectoris: Secondary | ICD-10-CM | POA: Insufficient documentation

## 2018-05-05 ENCOUNTER — Encounter (HOSPITAL_COMMUNITY)
Admission: RE | Admit: 2018-05-05 | Discharge: 2018-05-05 | Disposition: A | Discharge status: Home / Self Care | Payer: Self-pay | Source: Ambulatory Visit | Attending: Pulmonary Disease | Admitting: Pulmonary Disease

## 2018-05-07 ENCOUNTER — Other Ambulatory Visit: Payer: Self-pay | Admitting: Pulmonary Disease

## 2018-05-10 ENCOUNTER — Encounter (HOSPITAL_COMMUNITY)
Admission: RE | Admit: 2018-05-10 | Discharge: 2018-05-10 | Disposition: A | Discharge status: Home / Self Care | Payer: Self-pay | Source: Ambulatory Visit | Attending: Pulmonary Disease | Admitting: Pulmonary Disease

## 2018-05-12 ENCOUNTER — Encounter (HOSPITAL_COMMUNITY)
Admission: RE | Admit: 2018-05-12 | Discharge: 2018-05-12 | Disposition: A | Discharge status: Home / Self Care | Payer: Self-pay | Source: Ambulatory Visit | Attending: Pulmonary Disease | Admitting: Pulmonary Disease

## 2018-05-17 ENCOUNTER — Encounter (HOSPITAL_COMMUNITY)
Admission: RE | Admit: 2018-05-17 | Discharge: 2018-05-17 | Disposition: A | Discharge status: Home / Self Care | Payer: Self-pay | Source: Ambulatory Visit | Attending: Pulmonary Disease | Admitting: Pulmonary Disease

## 2018-05-19 ENCOUNTER — Encounter (HOSPITAL_COMMUNITY)
Admission: RE | Admit: 2018-05-19 | Discharge: 2018-05-19 | Disposition: A | Discharge status: Home / Self Care | Payer: Self-pay | Source: Ambulatory Visit | Attending: Pulmonary Disease | Admitting: Pulmonary Disease

## 2018-05-26 ENCOUNTER — Encounter (HOSPITAL_COMMUNITY)
Admission: RE | Admit: 2018-05-26 | Discharge: 2018-05-26 | Disposition: A | Discharge status: Home / Self Care | Payer: Self-pay | Source: Ambulatory Visit | Attending: Pulmonary Disease | Admitting: Pulmonary Disease

## 2018-05-31 ENCOUNTER — Encounter (HOSPITAL_COMMUNITY)
Admission: RE | Admit: 2018-05-31 | Discharge: 2018-05-31 | Disposition: A | Discharge status: Home / Self Care | Payer: Self-pay | Source: Ambulatory Visit | Attending: Pulmonary Disease | Admitting: Pulmonary Disease

## 2018-05-31 DIAGNOSIS — I272 Pulmonary hypertension, unspecified: Secondary | ICD-10-CM | POA: Insufficient documentation

## 2018-05-31 DIAGNOSIS — Z79899 Other long term (current) drug therapy: Secondary | ICD-10-CM | POA: Insufficient documentation

## 2018-05-31 DIAGNOSIS — Z87891 Personal history of nicotine dependence: Secondary | ICD-10-CM | POA: Insufficient documentation

## 2018-05-31 DIAGNOSIS — F411 Generalized anxiety disorder: Secondary | ICD-10-CM | POA: Insufficient documentation

## 2018-05-31 DIAGNOSIS — I2510 Atherosclerotic heart disease of native coronary artery without angina pectoris: Secondary | ICD-10-CM | POA: Insufficient documentation

## 2018-05-31 DIAGNOSIS — Z7982 Long term (current) use of aspirin: Secondary | ICD-10-CM | POA: Insufficient documentation

## 2018-05-31 DIAGNOSIS — E785 Hyperlipidemia, unspecified: Secondary | ICD-10-CM | POA: Insufficient documentation

## 2018-06-02 ENCOUNTER — Other Ambulatory Visit: Payer: Self-pay | Admitting: Interventional Cardiology

## 2018-06-02 ENCOUNTER — Encounter (HOSPITAL_COMMUNITY)
Admission: RE | Admit: 2018-06-02 | Discharge: 2018-06-02 | Disposition: A | Discharge status: Home / Self Care | Payer: Self-pay | Source: Ambulatory Visit | Attending: Pulmonary Disease | Admitting: Pulmonary Disease

## 2018-06-03 ENCOUNTER — Other Ambulatory Visit: Payer: Self-pay | Admitting: Interventional Cardiology

## 2018-06-03 NOTE — Telephone Encounter (Signed)
This was refilled today as below for #30 with a note to call and schedule an appointment for further refills but pharmacy is now requesting #90. Upon reviewing patients chart, it is noted that he is to f/u with Dr Irish Lack as needed. Please advise on request. Thanks, MI

## 2018-06-07 ENCOUNTER — Encounter (HOSPITAL_COMMUNITY)
Admission: RE | Admit: 2018-06-07 | Discharge: 2018-06-07 | Disposition: A | Discharge status: Home / Self Care | Payer: Self-pay | Source: Ambulatory Visit | Attending: Pulmonary Disease | Admitting: Pulmonary Disease

## 2018-06-07 NOTE — Telephone Encounter (Signed)
Called and spoke to patient who states that he has an appointment with his heart doctor at [facility] and PCP at the end of the week and he will call back and let us know if they are going to take over filling his crestor.

## 2018-06-14 ENCOUNTER — Encounter (HOSPITAL_COMMUNITY)
Admission: RE | Admit: 2018-06-14 | Discharge: 2018-06-14 | Disposition: A | Discharge status: Home / Self Care | Payer: Self-pay | Source: Ambulatory Visit | Attending: Pulmonary Disease | Admitting: Pulmonary Disease

## 2018-06-16 ENCOUNTER — Telehealth: Payer: Self-pay | Admitting: Pharmacy Technician

## 2018-06-16 DIAGNOSIS — I272 Pulmonary hypertension, unspecified: Secondary | ICD-10-CM

## 2018-06-16 NOTE — Telephone Encounter (Signed)
Called and spoke with pt who states he has a prescription for labwork from Dr. Johnell Comings office and pt is wanting to know if BQ will okay for him to get the labs done here if BQ will sign off on it.  Dr. Lake Bells, please advise on this for pt if you are okay signing off on the labs. Thanks!

## 2018-06-16 NOTE — Telephone Encounter (Signed)
Yes, OK by me

## 2018-06-16 NOTE — Telephone Encounter (Signed)
LMTCB

## 2018-06-17 NOTE — Telephone Encounter (Signed)
Called and spoke with pt letting him know that BQ was fine signing off on the lab orders.  Stated to pt to bring the sheet with all the labs that Dr. Winfield Rast was wanting him to get done so that way we could reorder the labs under Dr. Anastasia Pall name to make it to where he could get them done at our building.  Pt expressed understanding and stated he would bring the sheet by our office.  Will leave encounter open until all the labs have been able to be ordered for pt under BQ's name.

## 2018-06-20 NOTE — Telephone Encounter (Signed)
Pt want's to know if he can come in today and get the labs done and want's to know if he needs to fast.  [phone]

## 2018-06-20 NOTE — Telephone Encounter (Signed)
Called and spoke to pt, who states [facility] ordered for him to have Bmp, nt-pro and bnp. Pt is wanting to know if labs could be performed here under BQ.   BQ please advise. Thanks.

## 2018-06-20 NOTE — Telephone Encounter (Signed)
Pt is aware of below message and voiced his understanding. Labs have been ordered. Nothing further is needed.

## 2018-06-20 NOTE — Telephone Encounter (Signed)
Yes Suspect BMET is "BMP" proBNP and BNP for others but if there is an nt-pro lab OK to order that too

## 2018-06-21 ENCOUNTER — Other Ambulatory Visit (INDEPENDENT_AMBULATORY_CARE_PROVIDER_SITE_OTHER): Payer: Medicare Other

## 2018-06-21 ENCOUNTER — Encounter (HOSPITAL_COMMUNITY)
Admission: RE | Admit: 2018-06-21 | Discharge: 2018-06-21 | Disposition: A | Discharge status: Home / Self Care | Payer: Self-pay | Source: Ambulatory Visit | Attending: Pulmonary Disease | Admitting: Pulmonary Disease

## 2018-06-21 DIAGNOSIS — I272 Pulmonary hypertension, unspecified: Secondary | ICD-10-CM

## 2018-06-21 LAB — BASIC METABOLIC PANEL
BUN: 26 mg/dL — AB (ref 6–23)
CHLORIDE: 106 meq/L (ref 96–112)
CO2: 26 mEq/L (ref 19–32)
Calcium: 8.9 mg/dL (ref 8.4–10.5)
Creatinine, Ser: 1.23 mg/dL (ref 0.40–1.50)
GFR: 61.36 mL/min (ref >60.00)
GLUCOSE: 110 mg/dL — AB (ref 70–99)
POTASSIUM: 3.9 meq/L (ref 3.5–5.1)
Sodium: 143 mEq/L (ref 135–145)

## 2018-06-21 LAB — BRAIN NATRIURETIC PEPTIDE: Pro B Natriuretic peptide (BNP): 1050 pg/mL — ABNORMAL HIGH (ref 0.0–100.0)

## 2018-06-23 ENCOUNTER — Encounter (HOSPITAL_COMMUNITY)
Admission: RE | Admit: 2018-06-23 | Discharge: 2018-06-23 | Disposition: A | Discharge status: Home / Self Care | Payer: Self-pay | Source: Ambulatory Visit | Attending: Pulmonary Disease | Admitting: Pulmonary Disease

## 2018-06-23 LAB — NT-PROBNP: Pro B Natriuretic peptide (BNP): 4976 pg/mL

## 2018-06-30 ENCOUNTER — Encounter (HOSPITAL_COMMUNITY)
Admission: RE | Admit: 2018-06-30 | Discharge: 2018-06-30 | Disposition: A | Discharge status: Home / Self Care | Payer: Self-pay | Source: Ambulatory Visit | Attending: Pulmonary Disease | Admitting: Pulmonary Disease

## 2018-07-05 ENCOUNTER — Encounter (HOSPITAL_COMMUNITY)
Admission: RE | Admit: 2018-07-05 | Discharge: 2018-07-05 | Disposition: A | Discharge status: Home / Self Care | Payer: Self-pay | Source: Ambulatory Visit | Attending: Pulmonary Disease | Admitting: Pulmonary Disease

## 2018-07-05 DIAGNOSIS — Z87891 Personal history of nicotine dependence: Secondary | ICD-10-CM | POA: Insufficient documentation

## 2018-07-05 DIAGNOSIS — I272 Pulmonary hypertension, unspecified: Secondary | ICD-10-CM | POA: Insufficient documentation

## 2018-07-05 DIAGNOSIS — E785 Hyperlipidemia, unspecified: Secondary | ICD-10-CM | POA: Insufficient documentation

## 2018-07-05 DIAGNOSIS — Z7982 Long term (current) use of aspirin: Secondary | ICD-10-CM | POA: Insufficient documentation

## 2018-07-05 DIAGNOSIS — Z79899 Other long term (current) drug therapy: Secondary | ICD-10-CM | POA: Insufficient documentation

## 2018-07-05 DIAGNOSIS — I2510 Atherosclerotic heart disease of native coronary artery without angina pectoris: Secondary | ICD-10-CM | POA: Insufficient documentation

## 2018-07-05 DIAGNOSIS — F411 Generalized anxiety disorder: Secondary | ICD-10-CM | POA: Insufficient documentation

## 2018-07-07 ENCOUNTER — Other Ambulatory Visit: Payer: Self-pay | Admitting: Interventional Cardiology

## 2018-07-07 ENCOUNTER — Encounter (HOSPITAL_COMMUNITY)
Admission: RE | Admit: 2018-07-07 | Discharge: 2018-07-07 | Disposition: A | Discharge status: Home / Self Care | Payer: Self-pay | Source: Ambulatory Visit | Attending: Pulmonary Disease | Admitting: Pulmonary Disease

## 2018-07-12 ENCOUNTER — Encounter (HOSPITAL_COMMUNITY)
Admission: RE | Admit: 2018-07-12 | Discharge: 2018-07-12 | Disposition: A | Discharge status: Home / Self Care | Payer: Self-pay | Source: Ambulatory Visit | Attending: Pulmonary Disease | Admitting: Pulmonary Disease

## 2018-07-14 ENCOUNTER — Encounter (HOSPITAL_COMMUNITY)
Admission: RE | Admit: 2018-07-14 | Discharge: 2018-07-14 | Disposition: A | Discharge status: Home / Self Care | Payer: Self-pay | Source: Ambulatory Visit | Attending: Pulmonary Disease | Admitting: Pulmonary Disease

## 2018-08-02 ENCOUNTER — Encounter (HOSPITAL_COMMUNITY)
Admission: RE | Admit: 2018-08-02 | Discharge: 2018-08-02 | Disposition: A | Discharge status: Home / Self Care | Payer: Self-pay | Source: Ambulatory Visit | Attending: Pulmonary Disease | Admitting: Pulmonary Disease

## 2018-08-02 DIAGNOSIS — F411 Generalized anxiety disorder: Secondary | ICD-10-CM | POA: Insufficient documentation

## 2018-08-02 DIAGNOSIS — Z7982 Long term (current) use of aspirin: Secondary | ICD-10-CM | POA: Insufficient documentation

## 2018-08-02 DIAGNOSIS — Z79899 Other long term (current) drug therapy: Secondary | ICD-10-CM | POA: Insufficient documentation

## 2018-08-02 DIAGNOSIS — E785 Hyperlipidemia, unspecified: Secondary | ICD-10-CM | POA: Insufficient documentation

## 2018-08-02 DIAGNOSIS — I272 Pulmonary hypertension, unspecified: Secondary | ICD-10-CM | POA: Insufficient documentation

## 2018-08-02 DIAGNOSIS — I2510 Atherosclerotic heart disease of native coronary artery without angina pectoris: Secondary | ICD-10-CM | POA: Insufficient documentation

## 2018-08-02 DIAGNOSIS — Z87891 Personal history of nicotine dependence: Secondary | ICD-10-CM | POA: Insufficient documentation

## 2018-08-04 ENCOUNTER — Encounter (HOSPITAL_COMMUNITY)
Admission: RE | Admit: 2018-08-04 | Discharge: 2018-08-04 | Disposition: A | Discharge status: Home / Self Care | Payer: Self-pay | Source: Ambulatory Visit | Attending: Pulmonary Disease | Admitting: Pulmonary Disease

## 2018-08-09 ENCOUNTER — Encounter (HOSPITAL_COMMUNITY)
Admission: RE | Admit: 2018-08-09 | Discharge: 2018-08-09 | Disposition: A | Discharge status: Home / Self Care | Payer: Self-pay | Source: Ambulatory Visit | Attending: Pulmonary Disease | Admitting: Pulmonary Disease

## 2018-08-11 ENCOUNTER — Encounter (HOSPITAL_COMMUNITY)
Admission: RE | Admit: 2018-08-11 | Discharge: 2018-08-11 | Disposition: A | Discharge status: Home / Self Care | Payer: Self-pay | Source: Ambulatory Visit | Attending: Pulmonary Disease | Admitting: Pulmonary Disease

## 2018-08-16 ENCOUNTER — Encounter (HOSPITAL_COMMUNITY)
Admission: RE | Admit: 2018-08-16 | Discharge: 2018-08-16 | Disposition: A | Discharge status: Home / Self Care | Payer: Self-pay | Source: Ambulatory Visit | Attending: Pulmonary Disease | Admitting: Pulmonary Disease

## 2018-08-18 ENCOUNTER — Encounter (HOSPITAL_COMMUNITY)
Admission: RE | Admit: 2018-08-18 | Discharge: 2018-08-18 | Disposition: A | Discharge status: Home / Self Care | Payer: Self-pay | Source: Ambulatory Visit | Attending: Pulmonary Disease | Admitting: Pulmonary Disease

## 2018-08-19 ENCOUNTER — Ambulatory Visit (INDEPENDENT_AMBULATORY_CARE_PROVIDER_SITE_OTHER): Payer: Medicare Other | Admitting: Pulmonary Disease

## 2018-08-19 ENCOUNTER — Encounter: Payer: Self-pay | Admitting: Pulmonary Disease

## 2018-08-19 VITALS — BP 108/62 | HR 80 | Ht 70.5 in | Wt 161.0 lb

## 2018-08-19 DIAGNOSIS — J432 Centrilobular emphysema: Secondary | ICD-10-CM

## 2018-08-19 DIAGNOSIS — I272 Pulmonary hypertension, unspecified: Secondary | ICD-10-CM | POA: Diagnosis not present

## 2018-08-19 DIAGNOSIS — J9611 Chronic respiratory failure with hypoxia: Secondary | ICD-10-CM

## 2018-08-19 DIAGNOSIS — Z5181 Encounter for therapeutic drug level monitoring: Secondary | ICD-10-CM | POA: Diagnosis not present

## 2018-08-19 LAB — COMPREHENSIVE METABOLIC PANEL
ALBUMIN: 3.9 g/dL (ref 3.5–5.2)
ALT: 16 U/L (ref 0–53)
AST: 27 U/L (ref 0–37)
Alkaline Phosphatase: 70 U/L (ref 39–117)
BILIRUBIN TOTAL: 1 mg/dL (ref 0.2–1.2)
BUN: 21 mg/dL (ref 6–23)
CALCIUM: 9 mg/dL (ref 8.4–10.5)
CO2: 30 mEq/L (ref 19–32)
Chloride: 101 mEq/L (ref 96–112)
Creatinine, Ser: 1.1 mg/dL (ref 0.40–1.50)
GFR: 69.77 mL/min (ref >60.00)
GLUCOSE: 98 mg/dL (ref 70–99)
POTASSIUM: 3.7 meq/L (ref 3.5–5.1)
Sodium: 138 mEq/L (ref 135–145)
TOTAL PROTEIN: 6.5 g/dL (ref 6.0–8.3)

## 2018-08-19 LAB — BRAIN NATRIURETIC PEPTIDE: PRO B NATRI PEPTIDE: 659 pg/mL — AB (ref 0.0–100.0)

## 2018-08-19 NOTE — Addendum Note (Signed)
Addended by: Suzzanne Cloud E on: 08/19/2018 09:36 AM   Modules accepted: Orders

## 2018-08-19 NOTE — Progress Notes (Signed)
Subjective:    Patient ID: [name], male    DOB: 05/05/1946, 72 y.o.   MRN: [id number]  Synopsis: Patient followed by Dr. Lamonte Sakai for years transferred care to [facility] in 2018. Has a past medical history significant for pulmonary hypertension, COPD. 2018 CT scan of the chest showed centrilobular emphysema. He is started [facility] March 2018.  Started Tyvaso February 2019 but he had to hold this due to fatigue.  Sildenafil 20 mg 3 times daily was started May 2019  HPI Chief Complaint  Patient presents with  . Follow-up    pt c/o stable sob with exertion.  denies mucus production, chest pain.    Kohle says that he is doing "OK all things considered". He saw the physicians at [facility] and they adjusted his diuretics the last visit. He has been taking sildenafil 40 tid and his furosemide is now 67m and he started taking aldactone.  He lost about 5 pounds of fluid.    He says that the adjustment in his weight due to diuretics didn't really affect his breathlessness.  He has been out of his regular cycle of exercise because his wife had breast cancer.   No new breathing problems. His COPD has not flared up since the last visit.    Past Medical History:  Diagnosis Date  . CAD (coronary artery disease)   . GAD (generalized anxiety disorder)   . Hyperlipidemia   . Overweight(278.02)   . Pulmonary hypertension ([place]        Review of Systems  Constitutional: Negative for fever and unexpected weight change.  HENT: Negative for congestion, dental problem, ear pain, nosebleeds, postnasal drip, rhinorrhea, sinus pressure, sneezing, sore throat and trouble swallowing.   Eyes: Negative for redness and itching.  Respiratory: Positive for shortness of breath. Negative for cough, chest tightness and wheezing.   Cardiovascular: Negative for palpitations and leg swelling.  Gastrointestinal: Negative for nausea and vomiting.  Genitourinary: Negative for dysuria.  Musculoskeletal: Negative for joint  swelling.  Skin: Negative for rash.  Allergic/Immunologic: Negative.  Negative for environmental allergies, food allergies and immunocompromised state.  Neurological: Negative for headaches.  Hematological: Does not bruise/bleed easily.  Psychiatric/Behavioral: Negative for dysphoric mood. The patient is not nervous/anxious.        Objective:   Physical Exam Vitals:   08/19/18 0904  BP: 108/62  Pulse: 80  SpO2: 95%  Weight: 161 lb (73 kg)  Height: 5' 10.5" (1.791 m)   2.5L continuous flow   Gen: chronically ill appearing HENT: OP clear, TM's clear, neck supple PULM: Poor air movement B, normal percussion CV: RRR, no mgr, trace edema GI: BS+, soft, nontender Derm: no cyanosis or rash Psyche: normal mood and affect   Chest imaging: April 2018 CT chest images independently reviewed showing moderate to severe centrilobular emphysema, 3 mm right upper lobe solid pulmonary nodule. Emphysema is upper lobe predominant April 2018 VQ scan showed some mismatch in the upper lobes where he has emphysema but otherwise no worrisome findings for chronic, embolic pulmonary hypertension  Echo: February 2018 LVEF 60-65% with grade 1 diastolic dysfunction RV mildly dilated with normal function. 05/2017 LVEF 60-65%, RV mildly dilated, RVSP 254mg May 2019 [facility] normal LV function, mild RV dysfunction, RVSP 74 mmHg 05/2018 [facility] LVEF 55%, RVSP 7623mg, no significant valvular disease; RV mod enlarged  PFT: January 2018: Ratio 65%, FEV1 2.96 L 85% predicted, 13 % change post bronchodilator, FVC 4.5 L 96% predicted, total lung capacity 6.84 L 92%  predicted, DLCO 8.5 324% predicted November 2018: Ratio 98%, FEV1 97% predicted, total lung capacity 103% predicted, DLCO 30.8% predicted  6-minute walk test: May 2019 [facility] 327 m O2 saturation nadir to 72% on 15 L nasal cannula. October 2019 [facility] 279 m O2 saturation nadir 83% 15 LPM  Heart Cath: 10/2016 RHC: RA 4, RV 78/4, PA 79/23 (43), PCW 4, Fick  5.2/2.5 10/2016 LHC:   Mild to moderate left sided CAD.  Mid RCA lesion, 100 %stenosed. 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  Ost LM to LM lesion, 20 %stenosed.  The left ventricular systolic function is normal.  LV end diastolic pressure is normal.  08/27/2018 RHC [facility]: Cardiac Index (l/min/m2) 3.0 L/min/m2  Right Atrium Mean Pressure (mmHg) 3 mmHg  Right Ventricle Systolic Pressure (mmHg) 68 mmHg  Pulmonary Artery Mean Pressure (mmHg) 39 mmHg  Pulmonary Wedge Pressure (mmHg) 5 mmHg  Pulmonary Vascular Resistance (Wood units) 5.7 Wood units   Records from his visit with [facility] reviewed, he had a decreased 6MW distance so they increased his diuresis with furosemide and spironolactone.  They were considering reducing his sildenafil as well.      Assessment & Plan:   Pulmonary hypertension (HCC)  Centrilobular emphysema (HCC)  Chronic respiratory failure with hypoxia (HCC)  Discussion: From a COPD standpoint this has been a stable interval for [place].  He has not had an exacerbation since the last visit.  He remains compliant with his Trelegy.  His pulmonary hypertension regimen was not adjusted after his last visit with his pulmonary vascular doctors at [facility] but his diuretics were.  It did not seem to make too much of an impact on his breathing.  We will check a proBNP and basic metabolic panel today so that information is available for his pulmonary vascular team.  Plan: [place] [employer] group 1 and 3 pulmonary hypertension: Continue macitentan Continue sildenafil Continue furosemide Continue Aldactone Check comprehensive metabolic panel to monitor electrolytes and monitor for drug toxicity Check proBNP to monitor RV stretch/fluid retention  Chronic respiratory failure with hypoxemia: Keep using oxygen as you are doing, 2 to 4 L at rest, upwards of 15 L with heavy exertion  COPD/centrilobular  emphysema: I am glad that your flu shot is up to date Practice good hand hygiene Remain active, continue pulmonary rehab Continue Trelegy 1 puff daily no matter how you feel  We will see you back in 4 months or sooner if needed   Current Outpatient Medications:  .  aspirin 81 MG tablet, Take 81 mg by mouth every evening. , Disp: , Rfl:  .  Azelastine-Fluticasone (DYMISTA) 137-50 MCG/ACT SUSP, Place 1-2 sprays into the nose daily. (Patient taking differently: Place 1-2 sprays into the nose daily as needed. ), Disp: 1 Bottle, Rfl: 5 .  clonazePAM (KLONOPIN) 0.5 MG tablet, Take 0.5 mg 3 (three) times daily as needed by mouth for anxiety. , Disp: , Rfl: 0 .  Coenzyme Q10 (VITALINE COQ10) 100 MG WAFR, Take 60 mg by mouth daily., Disp: , Rfl:  .  DULoxetine (CYMBALTA) 30 MG capsule, TK 1 TO 2 CS PO QD, Disp: , Rfl:  .  ipratropium (ATROVENT) 0.06 % nasal spray, Place 2 sprays into both nostrils 4 (four) times daily as needed for rhinitis., Disp: 15 mL, Rfl: 5 .  Lactobacillus (ACIDOPHILUS PO), Take 1 capsule by mouth daily. , Disp: , Rfl:  .  Macitentan (OPSUMIT) 10 MG TABS, Take 1 tablet (  10 mg total) by mouth daily., Disp: 30 tablet, Rfl: 11 .  mirtazapine (REMERON) 15 MG tablet, Take 15 mg by mouth at bedtime., Disp: , Rfl:  .  Multiple Vitamins-Minerals (CENTRUM CARDIO PO), Take 1 tablet by mouth daily. , Disp: , Rfl:  .  rosuvastatin (CRESTOR) 40 MG tablet, Take 1 tablet (40 mg total) by mouth daily. Please make overdue appt with Dr. Irish Lack before anymore refills. 2nd attempt, Disp: 15 tablet, Rfl: 0 .  sildenafil (REVATIO) 20 MG tablet, Take 40 mg by mouth 3 (three) times daily. , Disp: , Rfl:  .  TRELEGY ELLIPTA 100-62.5-25 MCG/INH AEPB, INHALE 1 PUFF INTO THE LUNGS DAILY, Disp: 1 each, Rfl: 3

## 2018-08-19 NOTE — Patient Instructions (Signed)
[employer] Organization group 1 and 3 pulmonary hypertension: Continue macitentan Continue sildenafil Continue furosemide Continue Aldactone Check basic metabolic panel to monitor electrolytes Check proBNP to monitor RV stretch/fluid retention  Chronic respiratory failure with hypoxemia: Keep using oxygen as you are doing, 2 to 4 L at rest, upwards of 15 L with heavy exertion  COPD/centrilobular emphysema: I am glad that your flu shot is up to date Practice good hand hygiene Remain active, continue pulmonary rehab Continue Trelegy 1 puff daily no matter how you feel  We will see you back in 4 months or sooner if needed

## 2018-08-19 NOTE — Addendum Note (Signed)
Addended by: Len Blalock on: 08/19/2018 09:28 AM   Modules accepted: Orders

## 2018-08-23 ENCOUNTER — Encounter (HOSPITAL_COMMUNITY)
Admission: RE | Admit: 2018-08-23 | Discharge: 2018-08-23 | Disposition: A | Discharge status: Home / Self Care | Payer: Self-pay | Source: Ambulatory Visit | Attending: Pulmonary Disease | Admitting: Pulmonary Disease

## 2018-08-25 ENCOUNTER — Encounter (HOSPITAL_COMMUNITY)
Admission: RE | Admit: 2018-08-25 | Discharge: 2018-08-25 | Disposition: A | Discharge status: Home / Self Care | Payer: Self-pay | Source: Ambulatory Visit | Attending: Pulmonary Disease | Admitting: Pulmonary Disease

## 2018-08-30 ENCOUNTER — Encounter (HOSPITAL_COMMUNITY): Payer: Self-pay

## 2018-09-01 ENCOUNTER — Encounter (HOSPITAL_COMMUNITY)
Admission: RE | Admit: 2018-09-01 | Discharge: 2018-09-01 | Disposition: A | Discharge status: Home / Self Care | Payer: Medicare Other | Source: Ambulatory Visit | Attending: Pulmonary Disease | Admitting: Pulmonary Disease

## 2018-09-01 DIAGNOSIS — Z87891 Personal history of nicotine dependence: Secondary | ICD-10-CM | POA: Insufficient documentation

## 2018-09-01 DIAGNOSIS — F411 Generalized anxiety disorder: Secondary | ICD-10-CM | POA: Insufficient documentation

## 2018-09-01 DIAGNOSIS — Z7982 Long term (current) use of aspirin: Secondary | ICD-10-CM | POA: Insufficient documentation

## 2018-09-01 DIAGNOSIS — I272 Pulmonary hypertension, unspecified: Secondary | ICD-10-CM | POA: Insufficient documentation

## 2018-09-01 DIAGNOSIS — Z79899 Other long term (current) drug therapy: Secondary | ICD-10-CM | POA: Insufficient documentation

## 2018-09-01 DIAGNOSIS — E785 Hyperlipidemia, unspecified: Secondary | ICD-10-CM | POA: Insufficient documentation

## 2018-09-01 DIAGNOSIS — I2510 Atherosclerotic heart disease of native coronary artery without angina pectoris: Secondary | ICD-10-CM | POA: Insufficient documentation

## 2018-09-06 ENCOUNTER — Encounter (HOSPITAL_COMMUNITY)
Admission: RE | Admit: 2018-09-06 | Discharge: 2018-09-06 | Disposition: A | Discharge status: Home / Self Care | Payer: Self-pay | Source: Ambulatory Visit | Attending: Pulmonary Disease | Admitting: Pulmonary Disease

## 2018-09-08 ENCOUNTER — Encounter (HOSPITAL_COMMUNITY)
Admission: RE | Admit: 2018-09-08 | Discharge: 2018-09-08 | Disposition: A | Discharge status: Home / Self Care | Payer: Self-pay | Source: Ambulatory Visit | Attending: Pulmonary Disease | Admitting: Pulmonary Disease

## 2018-09-12 ENCOUNTER — Encounter: Payer: Self-pay | Admitting: Podiatry

## 2018-09-12 ENCOUNTER — Other Ambulatory Visit: Payer: Self-pay | Admitting: Pulmonary Disease

## 2018-09-12 ENCOUNTER — Ambulatory Visit: Payer: Medicare Other | Admitting: Podiatry

## 2018-09-12 DIAGNOSIS — M216X2 Other acquired deformities of left foot: Secondary | ICD-10-CM

## 2018-09-12 DIAGNOSIS — Q828 Other specified congenital malformations of skin: Secondary | ICD-10-CM

## 2018-09-13 ENCOUNTER — Encounter (HOSPITAL_COMMUNITY)
Admission: RE | Admit: 2018-09-13 | Discharge: 2018-09-13 | Disposition: A | Discharge status: Home / Self Care | Payer: Self-pay | Source: Ambulatory Visit | Attending: Pulmonary Disease | Admitting: Pulmonary Disease

## 2018-09-15 ENCOUNTER — Encounter (HOSPITAL_COMMUNITY)
Admission: RE | Admit: 2018-09-15 | Discharge: 2018-09-15 | Disposition: A | Discharge status: Home / Self Care | Payer: Self-pay | Source: Ambulatory Visit | Attending: Pulmonary Disease | Admitting: Pulmonary Disease

## 2018-09-18 NOTE — Progress Notes (Signed)
Subjective: 73 year old male presents the office today for concerns of a skin lesion on the left foot submetatarsal 4.  He states he has the same area that he had previously is done well for some time but recently started to develop pain again.  Denies any redness or drainage or any swelling to the area.  No recent injury.  No other concerns. Denies any systemic complaints such as fevers, chills, nausea, vomiting. No acute changes since last appointment, and no other complaints at this time.   Objective: AAO x3, NAD DP/PT pulses palpable bilaterally, CRT less than 3 seconds Punctate annular hyperkeratotic lesion left foot submetatarsal 4.  Upon debridement there is no underlying ulceration, drainage or any clinical signs of infection noted today.  No open lesions are identified otherwise.   Prominent metatarsal heads plantarly with atrophy of the fat pad..  No pain with calf compression, swelling, warmth, erythema  Assessment: Symptomatic porokeratosis left foot  Plan: -All treatment options discussed with the patient including all alternatives, risks, complications.  -Lesion was sharply debrided without any complications or bleeding.  Discussed offloading as well as moisturizer to the area daily. -Patient encouraged to call the office with any questions, concerns, change in symptoms.   Trula Slade DPM

## 2018-09-20 ENCOUNTER — Encounter (HOSPITAL_COMMUNITY)
Admission: RE | Admit: 2018-09-20 | Discharge: 2018-09-20 | Disposition: A | Discharge status: Home / Self Care | Payer: Self-pay | Source: Ambulatory Visit | Attending: Pulmonary Disease | Admitting: Pulmonary Disease

## 2018-09-22 ENCOUNTER — Encounter (HOSPITAL_COMMUNITY)
Admission: RE | Admit: 2018-09-22 | Discharge: 2018-09-22 | Disposition: A | Discharge status: Home / Self Care | Payer: Self-pay | Source: Ambulatory Visit | Attending: Pulmonary Disease | Admitting: Pulmonary Disease

## 2018-09-27 ENCOUNTER — Encounter (HOSPITAL_COMMUNITY)
Admission: RE | Admit: 2018-09-27 | Discharge: 2018-09-27 | Disposition: A | Discharge status: Home / Self Care | Payer: Self-pay | Source: Ambulatory Visit | Attending: Pulmonary Disease | Admitting: Pulmonary Disease

## 2018-09-29 ENCOUNTER — Encounter (HOSPITAL_COMMUNITY): Payer: Self-pay

## 2018-10-04 ENCOUNTER — Encounter (HOSPITAL_COMMUNITY)
Admission: RE | Admit: 2018-10-04 | Discharge: 2018-10-04 | Disposition: A | Discharge status: Home / Self Care | Payer: Self-pay | Source: Ambulatory Visit | Attending: Pulmonary Disease | Admitting: Pulmonary Disease

## 2018-10-04 DIAGNOSIS — Z7982 Long term (current) use of aspirin: Secondary | ICD-10-CM | POA: Insufficient documentation

## 2018-10-04 DIAGNOSIS — E785 Hyperlipidemia, unspecified: Secondary | ICD-10-CM | POA: Insufficient documentation

## 2018-10-04 DIAGNOSIS — Z87891 Personal history of nicotine dependence: Secondary | ICD-10-CM | POA: Insufficient documentation

## 2018-10-04 DIAGNOSIS — I2510 Atherosclerotic heart disease of native coronary artery without angina pectoris: Secondary | ICD-10-CM | POA: Insufficient documentation

## 2018-10-04 DIAGNOSIS — F411 Generalized anxiety disorder: Secondary | ICD-10-CM | POA: Insufficient documentation

## 2018-10-04 DIAGNOSIS — Z79899 Other long term (current) drug therapy: Secondary | ICD-10-CM | POA: Insufficient documentation

## 2018-10-04 DIAGNOSIS — I272 Pulmonary hypertension, unspecified: Secondary | ICD-10-CM | POA: Insufficient documentation

## 2018-10-06 ENCOUNTER — Encounter (HOSPITAL_COMMUNITY): Payer: Self-pay

## 2018-10-11 ENCOUNTER — Encounter (HOSPITAL_COMMUNITY)
Admission: RE | Admit: 2018-10-11 | Discharge: 2018-10-11 | Disposition: A | Discharge status: Home / Self Care | Payer: Self-pay | Source: Ambulatory Visit | Attending: Pulmonary Disease | Admitting: Pulmonary Disease

## 2018-10-13 ENCOUNTER — Encounter (HOSPITAL_COMMUNITY)
Admission: RE | Admit: 2018-10-13 | Discharge: 2018-10-13 | Disposition: A | Discharge status: Home / Self Care | Payer: Self-pay | Source: Ambulatory Visit | Attending: Pulmonary Disease | Admitting: Pulmonary Disease

## 2018-10-18 ENCOUNTER — Encounter (HOSPITAL_COMMUNITY)
Admission: RE | Admit: 2018-10-18 | Discharge: 2018-10-18 | Disposition: A | Discharge status: Home / Self Care | Payer: Self-pay | Source: Ambulatory Visit | Attending: Pulmonary Disease | Admitting: Pulmonary Disease

## 2018-10-20 ENCOUNTER — Encounter (HOSPITAL_COMMUNITY): Payer: Self-pay

## 2018-10-25 ENCOUNTER — Encounter (HOSPITAL_COMMUNITY)
Admission: RE | Admit: 2018-10-25 | Discharge: 2018-10-25 | Disposition: A | Discharge status: Home / Self Care | Payer: Self-pay | Source: Ambulatory Visit | Attending: Pulmonary Disease | Admitting: Pulmonary Disease

## 2018-10-27 ENCOUNTER — Encounter (HOSPITAL_COMMUNITY): Payer: Self-pay

## 2018-11-01 ENCOUNTER — Encounter (HOSPITAL_COMMUNITY): Payer: Self-pay

## 2018-11-01 DIAGNOSIS — I272 Pulmonary hypertension, unspecified: Secondary | ICD-10-CM | POA: Insufficient documentation

## 2018-11-01 DIAGNOSIS — E785 Hyperlipidemia, unspecified: Secondary | ICD-10-CM | POA: Insufficient documentation

## 2018-11-01 DIAGNOSIS — Z87891 Personal history of nicotine dependence: Secondary | ICD-10-CM | POA: Insufficient documentation

## 2018-11-01 DIAGNOSIS — I2510 Atherosclerotic heart disease of native coronary artery without angina pectoris: Secondary | ICD-10-CM | POA: Insufficient documentation

## 2018-11-01 DIAGNOSIS — Z7982 Long term (current) use of aspirin: Secondary | ICD-10-CM | POA: Insufficient documentation

## 2018-11-01 DIAGNOSIS — Z79899 Other long term (current) drug therapy: Secondary | ICD-10-CM | POA: Insufficient documentation

## 2018-11-01 DIAGNOSIS — F411 Generalized anxiety disorder: Secondary | ICD-10-CM | POA: Insufficient documentation

## 2018-11-03 ENCOUNTER — Encounter (HOSPITAL_COMMUNITY): Payer: Self-pay

## 2018-11-08 ENCOUNTER — Encounter (HOSPITAL_COMMUNITY)
Admission: RE | Admit: 2018-11-08 | Discharge: 2018-11-08 | Disposition: A | Discharge status: Home / Self Care | Payer: Self-pay | Source: Ambulatory Visit | Attending: Pulmonary Disease | Admitting: Pulmonary Disease

## 2018-11-10 ENCOUNTER — Other Ambulatory Visit: Payer: Self-pay

## 2018-11-10 ENCOUNTER — Encounter (HOSPITAL_COMMUNITY)
Admission: RE | Admit: 2018-11-10 | Discharge: 2018-11-10 | Disposition: A | Discharge status: Home / Self Care | Payer: Self-pay | Source: Ambulatory Visit | Attending: Pulmonary Disease | Admitting: Pulmonary Disease

## 2018-11-10 ENCOUNTER — Telehealth: Payer: Self-pay

## 2018-11-10 DIAGNOSIS — I272 Pulmonary hypertension, unspecified: Secondary | ICD-10-CM

## 2018-11-10 DIAGNOSIS — R0609 Other forms of dyspnea: Secondary | ICD-10-CM

## 2018-11-10 DIAGNOSIS — J432 Centrilobular emphysema: Secondary | ICD-10-CM

## 2018-11-10 NOTE — Telephone Encounter (Signed)
LVM for [name] at pulmonary rehab-[facility] at phone [phone]. X1

## 2018-11-10 NOTE — Telephone Encounter (Signed)
Received call from [facility] at Pulmonary Rehab, she is requesting a order for a Oxymizer be sent to [facility] for this patient to be able to use during his rehab.   Call back number: [phone]

## 2018-11-14 ENCOUNTER — Telehealth (HOSPITAL_COMMUNITY): Payer: Self-pay | Admitting: *Deleted

## 2018-11-15 ENCOUNTER — Encounter (HOSPITAL_COMMUNITY): Payer: Self-pay

## 2018-11-15 NOTE — Telephone Encounter (Signed)
OK by me 

## 2018-11-15 NOTE — Telephone Encounter (Signed)
[name] from Pulmonary Rehab returning phone call.  [name] phone number is [phone].

## 2018-11-15 NOTE — Telephone Encounter (Signed)
Spoke with Lattie Haw. Advised her that we had received the message and that I would have to check with BQ first since he had not seen the patient recently. Per his chart, he has been seen at [facility].  BQ, please advise if you are ok with [place] ordering an oxymizer for the patient. Thanks!

## 2018-11-16 NOTE — Telephone Encounter (Signed)
Called back Pulmonary Rehab and spoke with Lattie Haw to advise her to request was approved.  Order placed. Nothing further needed at this time.

## 2018-11-17 ENCOUNTER — Encounter (HOSPITAL_COMMUNITY): Payer: Self-pay

## 2018-11-22 ENCOUNTER — Telehealth (HOSPITAL_COMMUNITY): Payer: Self-pay | Admitting: *Deleted

## 2018-11-22 ENCOUNTER — Encounter (HOSPITAL_COMMUNITY): Payer: Self-pay

## 2018-11-24 ENCOUNTER — Encounter (HOSPITAL_COMMUNITY): Payer: Self-pay

## 2018-11-29 ENCOUNTER — Encounter (HOSPITAL_COMMUNITY): Payer: Self-pay

## 2018-12-01 ENCOUNTER — Encounter (HOSPITAL_COMMUNITY): Payer: Self-pay

## 2018-12-06 ENCOUNTER — Encounter (HOSPITAL_COMMUNITY): Payer: Self-pay

## 2018-12-06 ENCOUNTER — Other Ambulatory Visit: Payer: Self-pay

## 2018-12-06 ENCOUNTER — Ambulatory Visit: Payer: Medicare Other | Admitting: Adult Health

## 2018-12-06 NOTE — Progress Notes (Signed)
  Visit rescheduled for 12/07/18 per pt

## 2018-12-07 ENCOUNTER — Ambulatory Visit (INDEPENDENT_AMBULATORY_CARE_PROVIDER_SITE_OTHER): Payer: Medicare Other | Admitting: Adult Health

## 2018-12-07 ENCOUNTER — Other Ambulatory Visit: Payer: Self-pay

## 2018-12-07 ENCOUNTER — Encounter: Payer: Self-pay | Admitting: Adult Health

## 2018-12-07 DIAGNOSIS — J9611 Chronic respiratory failure with hypoxia: Secondary | ICD-10-CM | POA: Diagnosis not present

## 2018-12-07 DIAGNOSIS — I272 Pulmonary hypertension, unspecified: Secondary | ICD-10-CM

## 2018-12-07 DIAGNOSIS — J449 Chronic obstructive pulmonary disease, unspecified: Secondary | ICD-10-CM

## 2018-12-07 NOTE — Progress Notes (Signed)
Virtual Visit via Telephone Note  I connected with [name] on 12/07/18 at  9:30 AM EDT by telephone and verified that I am speaking with the correct person using two identifiers.   I discussed the limitations, risks, security and privacy concerns of performing an evaluation and management service by telephone and the availability of in person appointments. I also discussed with the patient that there may be a patient responsible charge related to this service. The patient expressed understanding and agreed to proceed.   History of Present Illness: Today tele-visit is a 60-monthfollow-up for pulmonary hypertension, COPD/emphysema.  73year old male former smoker followed for hypertension (on OPSUMIT, and sildenafil) [facility]  He follows with Dr. [place]ake Bell[place].  He is classified [place] 3 pulmonary hypertension.  He remains on furosemide and Aldactone diuretics.  He is maintained on oxygen for chronic hypoxemia with 2 to 4 L at rest and 15 L with heavy activity/exercise.  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 No flare in cough or wheezing . Dyspnea is at baseline.  Daily weights , currently at 156lbs. Has lower extremity swelling about the same. Uses compression socks and keeps legs elevated. Better in the mornings.  Activity level has been up with recent move.  Says under a lot of stress , sold house last month. Zapped his energy with all the extra work.  [place]  [facility] CM[facility] was ok except for BUN , LFT nml , H/H and WBC nml . BNP was elevated 21610>9604 Plt 94K.  Has follow up with [facility] on 12/22/18 .  Discussed COVID precautions. Discussed wearing masks outside of home.  Remains on Oxygen 2-4 l/m rest and 15 l. With heavy activity /exercise. No increased oxygen demands. Pulmonary rehab is on hold.   Complained of runny nose especially in the morning after getting up.     Observations/Objective: April 2018 CT chest images independently reviewed showing moderate to severe centrilobular emphysema, 3 mm right upper lobe solid pulmonary nodule. Emphysema is upper lobe predominant April 2018 VQ scan showed some mismatch in the upper lobes where he has emphysema but otherwise no worrisome findings for chronic, embolic pulmonary hypertension  Echo: February 2018 LVEF 60-65% with grade 1 diastolic dysfunction RV mildly dilated with normal function. 05/2017 LVEF 60-65%, RV mildly dilated, RVSP 246mg May 2019 [facility] normal LV function, mild RV dysfunction, RVSP 74 mmHg 05/2018 [facility] LVEF 55%, RVSP 7617mg, no significant valvular disease; RV mod enlarged  PFT: January 2018: Ratio 65%, FEV1 2.96 L 85% predicted, 13 % change post bronchodilator, FVC 4.5 L 96% predicted, total lung capacity 6.84 L 92% predicted, DLCO 8.5 324% predicted November 2018: Ratio 98%, FEV1 97% predicted, total lung capacity 103% predicted, DLCO 30.8% predicted  6-minute walk test: May 2019 [facility] 327 m O2 saturation nadir to 72% on 15 L nasal cannula. 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  Mid RCA lesion, 100 %stenosed. 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  Ost LM to LM lesion, 20 %stenosed.  The left ventricular systolic function is normal.  LV end diastolic pressure  is normal.  08/27/2018 RHC [facility]: Cardiac Index (l/min/m2) 3.0 L/min/m2  Right Atrium Mean Pressure (mmHg) 3 mmHg  Right Ventricle Systolic Pressure (mmHg) 68 mmHg  Pulmonary Artery Mean Pressure (mmHg) 39 mmHg  Pulmonary Wedge Pressure (mmHg) 5 mmHg  Pulmonary Vascular Resistance (Wood units) 5.7 Wood units   Records from his visit with [facility]  reviewed, he had a decreased 6MW distance so they increased his diuresis with furosemide and spironolactone.  They were considering reducing his sildenafil as well.     Assessment and Plan: COPD -stable on TRELEGY   Pulmonary HTN -WHO group 1 and 3 -appears stable , labs recently okay except BUN tr up. No evidence of volume overload with weights or leg swelling, no increased oxgyen demands. Since pulmonary rehab on hold, encouraged on activity in home with light exercises as tolerated.   O2 RF -no increased O2 demands.   Plan  Patient Instructions  Saline nasal spray Twice daily  As needed   Saline nasal gel At bedtime As needed   Dymista As needed    [place] group 1 and 3 pulmonary hypertension: Continue macitentan Continue sildenafil Continue furosemide Continue Aldactone Keep Follow up with [facility] as planned this month and labs as planned.    Chronic respiratory failure with hypoxemia: Keep using oxygen as you are doing, 2 to 4 L at rest, upwards of 15 L with heavy exertion  COPD/centrilobular emphysema: Continue Trelegy 1 puff daily no matter how you feel  Follow up with Dr. Lake Bells in 4 months and As needed         Follow Up Instructions: Follow up with Dr. Lake Bells in 4 months and As needed      I discussed the assessment and treatment plan with the patient. The patient was provided an opportunity to ask questions and all were answered. The patient agreed with the plan and demonstrated an understanding of the instructions.   The patient was advised to call back or seek an in-person evaluation if the symptoms worsen or if the condition fails to improve as anticipated.  I provided 28  minutes of non-face-to-face time during this encounter.   Rexene Edison, NP

## 2018-12-07 NOTE — Patient Instructions (Addendum)
Saline nasal spray Twice daily  As needed   Saline nasal gel At bedtime As needed   Dymista As needed    [place] group 1 and 3 pulmonary hypertension: Continue macitentan Continue sildenafil Continue furosemide Continue Aldactone Keep Follow up with [facility] as planned this month and labs as planned.    Chronic respiratory failure with hypoxemia: Keep using oxygen as you are doing, 2 to 4 L at rest, upwards of 15 L with heavy exertion  COPD/centrilobular emphysema: Continue Trelegy 1 puff daily no matter how you feel  Follow up with Dr. Lake Bells in 4 months and As needed

## 2018-12-08 ENCOUNTER — Encounter (HOSPITAL_COMMUNITY): Payer: Self-pay

## 2018-12-08 ENCOUNTER — Telehealth: Payer: Self-pay | Admitting: Pulmonary Disease

## 2018-12-08 MED ORDER — AZELASTINE-FLUTICASONE 137-50 MCG/ACT NA SUSP: 1.0000 | Freq: Every day | NASAL | 99 refills | Status: AC | PRN

## 2018-12-08 NOTE — Telephone Encounter (Signed)
That is fine  Please refill Dymista Nasal #1 , 2 puffs daily As needed  As needed  Refills  Please contact office for sooner follow up if symptoms do not improve or worsen or seek emergency care

## 2018-12-08 NOTE — Telephone Encounter (Signed)
Dymista Rx sent to pt's preferred pharmacy. Called and spoke with pt stating this info to him. Pt expressed understanding. Nothing further needed.

## 2018-12-08 NOTE — Progress Notes (Signed)
Reviewed, agree 

## 2018-12-08 NOTE — Telephone Encounter (Signed)
Called and spoke with pt. Pt stated he needs a refill of one of his nasal sprays due to having a constant runny nose and also feels like he will sneeze all the time. Pt has atrovent and dymista nasal sprays on his med list but pt wants to know which one would possibly work the best to help with his symptoms.  Tammy, please advise which one you think would work better for pt. Thanks!

## 2018-12-13 ENCOUNTER — Telehealth (HOSPITAL_COMMUNITY): Payer: Self-pay | Admitting: *Deleted

## 2018-12-13 ENCOUNTER — Encounter (HOSPITAL_COMMUNITY): Payer: Self-pay

## 2018-12-15 ENCOUNTER — Encounter (HOSPITAL_COMMUNITY): Payer: Self-pay

## 2018-12-19 ENCOUNTER — Ambulatory Visit: Payer: Medicare Other | Admitting: Pulmonary Disease

## 2018-12-20 ENCOUNTER — Encounter (HOSPITAL_COMMUNITY): Payer: Self-pay

## 2018-12-22 ENCOUNTER — Encounter (HOSPITAL_COMMUNITY): Payer: Self-pay

## 2018-12-27 ENCOUNTER — Encounter (HOSPITAL_COMMUNITY): Payer: Self-pay

## 2018-12-29 ENCOUNTER — Encounter (HOSPITAL_COMMUNITY): Payer: Self-pay

## 2019-01-03 ENCOUNTER — Encounter (HOSPITAL_COMMUNITY): Payer: Self-pay

## 2019-01-05 ENCOUNTER — Encounter (HOSPITAL_COMMUNITY): Payer: Self-pay

## 2019-01-10 ENCOUNTER — Encounter (HOSPITAL_COMMUNITY): Payer: Self-pay

## 2019-01-12 ENCOUNTER — Encounter (HOSPITAL_COMMUNITY): Payer: Self-pay

## 2019-01-17 ENCOUNTER — Encounter (HOSPITAL_COMMUNITY): Payer: Self-pay

## 2019-01-19 ENCOUNTER — Encounter (HOSPITAL_COMMUNITY): Payer: Self-pay

## 2019-01-24 ENCOUNTER — Encounter (HOSPITAL_COMMUNITY): Payer: Self-pay

## 2019-01-26 ENCOUNTER — Encounter (HOSPITAL_COMMUNITY): Payer: Self-pay

## 2019-01-31 ENCOUNTER — Encounter (HOSPITAL_COMMUNITY): Payer: Self-pay

## 2019-02-02 ENCOUNTER — Encounter (HOSPITAL_COMMUNITY): Payer: Self-pay

## 2019-02-07 ENCOUNTER — Encounter (HOSPITAL_COMMUNITY): Payer: Self-pay

## 2019-02-09 ENCOUNTER — Encounter (HOSPITAL_COMMUNITY): Payer: Self-pay

## 2019-02-14 ENCOUNTER — Encounter (HOSPITAL_COMMUNITY): Payer: Self-pay

## 2019-02-16 ENCOUNTER — Encounter (HOSPITAL_COMMUNITY): Payer: Self-pay

## 2019-02-18 ENCOUNTER — Other Ambulatory Visit: Payer: Self-pay | Admitting: Pulmonary Disease

## 2019-02-21 ENCOUNTER — Encounter (HOSPITAL_COMMUNITY): Payer: Self-pay

## 2019-02-23 ENCOUNTER — Encounter (HOSPITAL_COMMUNITY): Payer: Self-pay

## 2019-02-28 ENCOUNTER — Encounter (HOSPITAL_COMMUNITY): Payer: Self-pay

## 2019-03-02 ENCOUNTER — Encounter (HOSPITAL_COMMUNITY): Payer: Self-pay

## 2019-03-07 ENCOUNTER — Encounter (HOSPITAL_COMMUNITY): Payer: Self-pay

## 2019-03-08 ENCOUNTER — Telehealth (HOSPITAL_COMMUNITY): Payer: Self-pay | Admitting: *Deleted

## 2019-03-09 ENCOUNTER — Encounter (HOSPITAL_COMMUNITY): Payer: Self-pay

## 2019-03-14 ENCOUNTER — Encounter (HOSPITAL_COMMUNITY): Payer: Self-pay

## 2019-03-16 ENCOUNTER — Encounter (HOSPITAL_COMMUNITY): Payer: Self-pay

## 2019-03-21 ENCOUNTER — Encounter (HOSPITAL_COMMUNITY): Payer: Self-pay

## 2019-03-29 ENCOUNTER — Other Ambulatory Visit: Payer: Self-pay | Admitting: Pulmonary Disease

## 2019-05-25 ENCOUNTER — Telehealth (HOSPITAL_COMMUNITY): Payer: Self-pay | Admitting: *Deleted

## 2019-06-14 ENCOUNTER — Telehealth: Payer: Self-pay | Admitting: Pulmonary Disease

## 2019-06-14 MED ORDER — TRELEGY ELLIPTA 100-62.5-25 MCG/INH IN AEPB: 1.0000 | INHALATION_SPRAY | Freq: Every day | RESPIRATORY_TRACT | 1 refills | Status: AC

## 2019-06-14 NOTE — Telephone Encounter (Signed)
Rx for trelegy was refilled  Pt aware  Nothing further needed

## 2019-06-21 ENCOUNTER — Ambulatory Visit (INDEPENDENT_AMBULATORY_CARE_PROVIDER_SITE_OTHER): Payer: Medicare Other | Admitting: Adult Health

## 2019-06-21 ENCOUNTER — Encounter: Payer: Self-pay | Admitting: Adult Health

## 2019-06-21 DIAGNOSIS — J9611 Chronic respiratory failure with hypoxia: Secondary | ICD-10-CM

## 2019-06-21 DIAGNOSIS — J449 Chronic obstructive pulmonary disease, unspecified: Secondary | ICD-10-CM | POA: Diagnosis not present

## 2019-06-21 DIAGNOSIS — J300 Vasomotor rhinitis: Secondary | ICD-10-CM

## 2019-06-21 DIAGNOSIS — J432 Centrilobular emphysema: Secondary | ICD-10-CM | POA: Diagnosis not present

## 2019-06-21 DIAGNOSIS — I272 Pulmonary hypertension, unspecified: Secondary | ICD-10-CM

## 2019-06-21 NOTE — Patient Instructions (Signed)
Saline nasal spray Twice daily  As needed   Saline nasal gel At bedtime As needed   Flonase Nasal daily As needed    [place] group 1 and 3 pulmonary hypertension: Continue macitentan Continue sildenafil Continue furosemide Continue Aldactone Keep Follow up with [facility] as planned this week as planned to discuss Uptravi .    Chronic respiratory failure with hypoxemia: Keep using oxygen as you are doing, 2 to 4 L at rest, 6 l/m with activity , upwards of 15 L with heavy exertion (goal to have O2 sats >88-90%)  Activity as tolerated.   COPD/centrilobular emphysema: Continue Trelegy 1 puff daily, rinse after use.   Follow up with Dr. Vaughan Browner or [place] NP  in 6 months and As needed

## 2019-06-21 NOTE — Progress Notes (Signed)
Virtual Visit via Telephone Note  I connected with [name] on 06/21/19 at  9:30 AM EDT by telephone and verified that I am speaking with the correct person using two identifiers.  Location: Patient: Home  Provider: Office    I discussed the limitations, risks, security and privacy concerns of performing an evaluation and management service by telephone and the availability of in person appointments. I also discussed with the patient that there may be a patient responsible charge related to this service. The patient expressed understanding and agreed to proceed.   History of Present Illness: Today's televisit is a 6 month follow up for Pulmonary Hypertension, COPD/Emphysema   73 yo male former smoker followed for pulmonary hypertension (on OPSUMIT, Sildenafil ) followed [facility]. He follows with Dr. Lake Bells locally . He is classified WHO group 1 and 3 Pulmonary HTN. He is maintained on diuretics with aldactone 50m daily and lasix 642mTwice daily    . On chronic oxygen 2- l/m rest , 6l/m activity and 15l/m with heavy activity /exercise. He is maintained on Trelegy for COPD . Denies flare of cough or wheezing.  Says overall breathing is doing about the same. Gets short of breath with activity . No significant dyspnea at rest. Has power wheelchair , hard to walk for long periods. Doing physical therapy twice week. .  Does daily weights, current weight is 151lbs. Has chronic lower extremity swelling , better first thing in am , worse in evening. Wears support hose.  [facility]HO[facility] , PA 72/38, mean 52. Has been recommended to begin additional therapy with Uptravi. Has follow up with Pulmonary HTN clinic this week to discuss starting Uptravi.   [place]   Observations/Objective:  April 2018 CT chest images independently reviewed showing moderate to severe centrilobular emphysema, 3 mm right upper  lobe solid pulmonary nodule. Emphysema is upper lobe predominant April 2018 VQ scan showed some mismatch in the upper lobes where he has emphysema but otherwise no worrisome findings for chronic, embolic pulmonary hypertension  Echo: February 2018 LVEF 60-65% with grade 1 diastolic dysfunction RV mildly dilated with normal function. 05/2017 LVEF 60-65%, RV mildly dilated, RVSP 2870m May 2019 [facility] normal LV function, mild RV dysfunction, RVSP 74 mmHg 05/2018 [facility] LVEF 55%, RVSP 47m1m, no significant valvular disease; RV mod enlarged  PFT: January 2018: Ratio 65%, FEV1 2.96 L 85% predicted, 13 % change post bronchodilator, FVC 4.5 L 96% predicted, total lung capacity 6.84 L 92% predicted, DLCO 8.5 324% predicted November 2018: Ratio 98%, FEV1 97% predicted, total lung capacity 103% predicted, DLCO 30.8% predicted  6-minute walk test: May 2019 [facility] 327 m O2 saturation nadir to 72% on 15 L nasal cannula. 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  Mid RCA lesion, 100 %stenosed. 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  Ost LM to LM lesion, 20 %stenosed.  The left ventricular systolic function is normal.  LV end diastolic pressure is normal.  08/27/2018 RHC [facility]: Cardiac Index (l/min/m2) 3.0 L/min/m2  Right Atrium Mean Pressure (mmHg) 3 mmHg  Right Ventricle Systolic Pressure (mmHg) 68 mmHg  Pulmonary Artery Mean Pressure (mmHg) 39 mmHg  Pulmonary Wedge Pressure (mmHg) 5 mmHg  Pulmonary Vascular Resistance (Wood units) 5.7 Wood units    Assessment and Plan: COPD -stable on Trelegy   Pulmonary HTN -WHO group 1 and 3 -recent RHC at [facility] with persistent high pressures despite dual therapy. Per [facility] notes to begin Uptravi .   Oxygen dependent Respiratory Failure -goal O2 sats >88-90%.   Plan  Patient  Instructions  Saline nasal spray Twice daily  As needed   Saline nasal gel At bedtime As needed   Flonase Nasal daily As needed    [place] group 1 and 3 pulmonary hypertension: Continue macitentan Continue sildenafil Continue furosemide Continue Aldactone Keep Follow up with [facility] as planned this week as planned to discuss Uptravi .    Chronic respiratory failure with hypoxemia: Keep using oxygen as you are doing, 2 to 4 L at rest, 6 l/m with activity , upwards of 15 L with heavy exertion (goal to have O2 sats >88-90%)  Activity as tolerated.   COPD/centrilobular emphysema: Continue Trelegy 1 puff daily, rinse after use.   Follow up with Dr. Vaughan Browner or Cashlyn Huguley NP  in 6 months and As needed       Follow Up Instructions: Follow up with Dr. Vaughan Browner in 6 months and As needed     I discussed the assessment and treatment plan with the patient. The patient was provided an opportunity to ask questions and all were answered. The patient agreed with the plan and demonstrated an understanding of the instructions.   The patient was advised to call back or seek an in-person evaluation if the symptoms worsen or if the condition fails to improve as anticipated.  I provided 25  minutes of non-face-to-face time during this encounter.   Rexene Edison, NP

## 2019-08-08 ENCOUNTER — Emergency Department (HOSPITAL_COMMUNITY): Payer: Medicare Other

## 2019-08-08 ENCOUNTER — Encounter (HOSPITAL_COMMUNITY): Payer: Self-pay | Admitting: Emergency Medicine

## 2019-08-08 ENCOUNTER — Other Ambulatory Visit: Payer: Self-pay

## 2019-08-08 ENCOUNTER — Inpatient Hospital Stay (HOSPITAL_COMMUNITY)
Admission: EM | Admit: 2019-08-08 | Discharge: 2019-08-10 | DRG: 291 | Disposition: A | Discharge status: Skilled Nursing Facility | Payer: Medicare Other | Source: Skilled Nursing Facility | Attending: Internal Medicine | Admitting: Internal Medicine

## 2019-08-08 DIAGNOSIS — Z9981 Dependence on supplemental oxygen: Secondary | ICD-10-CM | POA: Diagnosis not present

## 2019-08-08 DIAGNOSIS — Z87891 Personal history of nicotine dependence: Secondary | ICD-10-CM | POA: Diagnosis not present

## 2019-08-08 DIAGNOSIS — Z20828 Contact with and (suspected) exposure to other viral communicable diseases: Secondary | ICD-10-CM | POA: Diagnosis present

## 2019-08-08 DIAGNOSIS — M25559 Pain in unspecified hip: Secondary | ICD-10-CM | POA: Diagnosis not present

## 2019-08-08 DIAGNOSIS — R64 Cachexia: Secondary | ICD-10-CM | POA: Diagnosis present

## 2019-08-08 DIAGNOSIS — Z880 Allergy status to penicillin: Secondary | ICD-10-CM | POA: Diagnosis not present

## 2019-08-08 DIAGNOSIS — Z7189 Other specified counseling: Secondary | ICD-10-CM | POA: Diagnosis not present

## 2019-08-08 DIAGNOSIS — Z7951 Long term (current) use of inhaled steroids: Secondary | ICD-10-CM

## 2019-08-08 DIAGNOSIS — Z66 Do not resuscitate: Secondary | ICD-10-CM | POA: Diagnosis present

## 2019-08-08 DIAGNOSIS — I361 Nonrheumatic tricuspid (valve) insufficiency: Secondary | ICD-10-CM

## 2019-08-08 DIAGNOSIS — R609 Edema, unspecified: Secondary | ICD-10-CM

## 2019-08-08 DIAGNOSIS — Z8249 Family history of ischemic heart disease and other diseases of the circulatory system: Secondary | ICD-10-CM

## 2019-08-08 DIAGNOSIS — Z888 Allergy status to other drugs, medicaments and biological substances status: Secondary | ICD-10-CM | POA: Diagnosis not present

## 2019-08-08 DIAGNOSIS — J439 Emphysema, unspecified: Secondary | ICD-10-CM | POA: Diagnosis present

## 2019-08-08 DIAGNOSIS — Z79899 Other long term (current) drug therapy: Secondary | ICD-10-CM

## 2019-08-08 DIAGNOSIS — Z85828 Personal history of other malignant neoplasm of skin: Secondary | ICD-10-CM

## 2019-08-08 DIAGNOSIS — I10 Essential (primary) hypertension: Secondary | ICD-10-CM | POA: Diagnosis present

## 2019-08-08 DIAGNOSIS — F329 Major depressive disorder, single episode, unspecified: Secondary | ICD-10-CM | POA: Diagnosis present

## 2019-08-08 DIAGNOSIS — R55 Syncope and collapse: Secondary | ICD-10-CM | POA: Diagnosis present

## 2019-08-08 DIAGNOSIS — W010XXA Fall on same level from slipping, tripping and stumbling without subsequent striking against object, initial encounter: Secondary | ICD-10-CM | POA: Diagnosis present

## 2019-08-08 DIAGNOSIS — I5031 Acute diastolic (congestive) heart failure: Secondary | ICD-10-CM | POA: Diagnosis not present

## 2019-08-08 DIAGNOSIS — Z8679 Personal history of other diseases of the circulatory system: Secondary | ICD-10-CM

## 2019-08-08 DIAGNOSIS — N179 Acute kidney failure, unspecified: Secondary | ICD-10-CM | POA: Diagnosis present

## 2019-08-08 DIAGNOSIS — I2781 Cor pulmonale (chronic): Secondary | ICD-10-CM | POA: Diagnosis present

## 2019-08-08 DIAGNOSIS — I5033 Acute on chronic diastolic (congestive) heart failure: Secondary | ICD-10-CM | POA: Diagnosis present

## 2019-08-08 DIAGNOSIS — Z6823 Body mass index (BMI) 23.0-23.9, adult: Secondary | ICD-10-CM

## 2019-08-08 DIAGNOSIS — S72011A Unspecified intracapsular fracture of right femur, initial encounter for closed fracture: Secondary | ICD-10-CM | POA: Diagnosis present

## 2019-08-08 DIAGNOSIS — I959 Hypotension, unspecified: Secondary | ICD-10-CM | POA: Diagnosis present

## 2019-08-08 DIAGNOSIS — F411 Generalized anxiety disorder: Secondary | ICD-10-CM | POA: Diagnosis present

## 2019-08-08 DIAGNOSIS — E785 Hyperlipidemia, unspecified: Secondary | ICD-10-CM | POA: Diagnosis present

## 2019-08-08 DIAGNOSIS — I878 Other specified disorders of veins: Secondary | ICD-10-CM | POA: Diagnosis present

## 2019-08-08 DIAGNOSIS — I2729 Other secondary pulmonary hypertension: Secondary | ICD-10-CM | POA: Diagnosis present

## 2019-08-08 DIAGNOSIS — I2510 Atherosclerotic heart disease of native coronary artery without angina pectoris: Secondary | ICD-10-CM | POA: Diagnosis present

## 2019-08-08 DIAGNOSIS — I272 Pulmonary hypertension, unspecified: Secondary | ICD-10-CM

## 2019-08-08 DIAGNOSIS — E875 Hyperkalemia: Secondary | ICD-10-CM | POA: Diagnosis present

## 2019-08-08 DIAGNOSIS — Z823 Family history of stroke: Secondary | ICD-10-CM

## 2019-08-08 DIAGNOSIS — I110 Hypertensive heart disease with heart failure: Secondary | ICD-10-CM | POA: Diagnosis present

## 2019-08-08 DIAGNOSIS — I5030 Unspecified diastolic (congestive) heart failure: Secondary | ICD-10-CM | POA: Diagnosis present

## 2019-08-08 DIAGNOSIS — I071 Rheumatic tricuspid insufficiency: Secondary | ICD-10-CM | POA: Diagnosis present

## 2019-08-08 DIAGNOSIS — R0603 Acute respiratory distress: Secondary | ICD-10-CM | POA: Diagnosis not present

## 2019-08-08 DIAGNOSIS — J9611 Chronic respiratory failure with hypoxia: Secondary | ICD-10-CM | POA: Diagnosis present

## 2019-08-08 DIAGNOSIS — I420 Dilated cardiomyopathy: Secondary | ICD-10-CM | POA: Diagnosis present

## 2019-08-08 DIAGNOSIS — S72001A Fracture of unspecified part of neck of right femur, initial encounter for closed fracture: Secondary | ICD-10-CM

## 2019-08-08 DIAGNOSIS — Z7982 Long term (current) use of aspirin: Secondary | ICD-10-CM

## 2019-08-08 LAB — POCT I-STAT EG7
Acid-base deficit: 6 mmol/L — ABNORMAL HIGH (ref 0.0–2.0)
Bicarbonate: 18.5 mmol/L — ABNORMAL LOW (ref 20.0–28.0)
Calcium, Ion: 0.96 mmol/L — ABNORMAL LOW (ref 1.15–1.40)
HCT: 48 % (ref 39.0–52.0)
Hemoglobin: 16.3 g/dL (ref 13.0–17.0)
O2 Saturation: 65 %
Potassium: 5.4 mmol/L — ABNORMAL HIGH (ref 3.5–5.1)
Sodium: 135 mmol/L (ref 135–145)
TCO2: 20 mmol/L — ABNORMAL LOW (ref 22–32)
pCO2, Ven: 34.8 mmHg — ABNORMAL LOW (ref 44.0–60.0)
pH, Ven: 7.335 (ref 7.250–7.430)
pO2, Ven: 35 mmHg (ref 32.0–45.0)

## 2019-08-08 LAB — COMPREHENSIVE METABOLIC PANEL
ALT: 32 U/L (ref 0–44)
AST: 64 U/L — ABNORMAL HIGH (ref 15–41)
Albumin: 3.4 g/dL — ABNORMAL LOW (ref 3.5–5.0)
Alkaline Phosphatase: 144 U/L — ABNORMAL HIGH (ref 38–126)
Anion gap: 17 — ABNORMAL HIGH (ref 5–15)
BUN: 47 mg/dL — ABNORMAL HIGH (ref 8–23)
CO2: 18 mmol/L — ABNORMAL LOW (ref 22–32)
Calcium: 8.6 mg/dL — ABNORMAL LOW (ref 8.9–10.3)
Chloride: 101 mmol/L (ref 98–111)
Creatinine, Ser: 2.09 mg/dL — ABNORMAL HIGH (ref 0.61–1.24)
GFR calc Af Amer: 35 mL/min — ABNORMAL LOW (ref >60)
GFR calc non Af Amer: 30 mL/min — ABNORMAL LOW (ref >60)
Glucose, Bld: 157 mg/dL — ABNORMAL HIGH (ref 70–99)
Potassium: 5.6 mmol/L — ABNORMAL HIGH (ref 3.5–5.1)
Sodium: 136 mmol/L (ref 135–145)
Total Bilirubin: 2.2 mg/dL — ABNORMAL HIGH (ref 0.3–1.2)
Total Protein: 6.7 g/dL (ref 6.5–8.1)

## 2019-08-08 LAB — URINALYSIS, ROUTINE W REFLEX MICROSCOPIC
Bilirubin Urine: NEGATIVE
Glucose, UA: NEGATIVE mg/dL
Hgb urine dipstick: NEGATIVE
Ketones, ur: NEGATIVE mg/dL
Leukocytes,Ua: NEGATIVE
Nitrite: NEGATIVE
Protein, ur: 100 mg/dL — AB
Specific Gravity, Urine: 1.011 (ref 1.005–1.030)
pH: 5 (ref 5.0–8.0)

## 2019-08-08 LAB — CBC WITH DIFFERENTIAL/PLATELET
Abs Immature Granulocytes: 0.05 10*3/uL (ref 0.00–0.07)
Basophils Absolute: 0 10*3/uL (ref 0.0–0.1)
Basophils Relative: 0 %
Eosinophils Absolute: 0 10*3/uL (ref 0.0–0.5)
Eosinophils Relative: 0 %
HCT: 50.5 % (ref 39.0–52.0)
Hemoglobin: 16.3 g/dL (ref 13.0–17.0)
Immature Granulocytes: 1 %
Lymphocytes Relative: 6 %
Lymphs Abs: 0.5 10*3/uL — ABNORMAL LOW (ref 0.7–4.0)
MCH: 30.9 pg (ref 26.0–34.0)
MCHC: 32.3 g/dL (ref 30.0–36.0)
MCV: 95.8 fL (ref 80.0–100.0)
Monocytes Absolute: 0.8 10*3/uL (ref 0.1–1.0)
Monocytes Relative: 11 %
Neutro Abs: 6.3 10*3/uL (ref 1.7–7.7)
Neutrophils Relative %: 82 %
Platelets: 102 10*3/uL — ABNORMAL LOW (ref 150–400)
RBC: 5.27 MIL/uL (ref 4.22–5.81)
RDW: 17.9 % — ABNORMAL HIGH (ref 11.5–15.5)
WBC: 7.7 10*3/uL (ref 4.0–10.5)
nRBC: 0 % (ref 0.0–0.2)

## 2019-08-08 LAB — POC SARS CORONAVIRUS 2 AG -  ED: SARS Coronavirus 2 Ag: NEGATIVE

## 2019-08-08 LAB — RESPIRATORY PANEL BY RT PCR (FLU A&B, COVID)
Influenza A by PCR: NEGATIVE
Influenza B by PCR: NEGATIVE
SARS Coronavirus 2 by RT PCR: NEGATIVE

## 2019-08-08 LAB — ECHOCARDIOGRAM COMPLETE
Height: 71 in
Weight: 2645.52 oz

## 2019-08-08 LAB — LACTIC ACID, PLASMA
Lactic Acid, Venous: 5.4 mmol/L (ref 0.5–1.9)
Lactic Acid, Venous: 3.2 mmol/L (ref 0.5–1.9)

## 2019-08-08 LAB — TROPONIN I (HIGH SENSITIVITY)
Troponin I (High Sensitivity): 28 ng/L — ABNORMAL HIGH (ref <18)
Troponin I (High Sensitivity): 58 ng/L — ABNORMAL HIGH (ref <18)

## 2019-08-08 LAB — SARS CORONAVIRUS 2 (TAT 6-24 HRS): SARS Coronavirus 2: NEGATIVE

## 2019-08-08 LAB — PROCALCITONIN: Procalcitonin: 0.23 ng/mL

## 2019-08-08 LAB — BRAIN NATRIURETIC PEPTIDE: B Natriuretic Peptide: 1046.6 pg/mL — ABNORMAL HIGH (ref 0.0–100.0)

## 2019-08-08 MED ORDER — SODIUM CHLORIDE 0.9% FLUSH
3.0000 mL | Freq: Two times a day (BID) | INTRAVENOUS | Status: DC
  Administered 2019-08-09 (×2): 3 mL via INTRAVENOUS

## 2019-08-08 MED ORDER — SILDENAFIL CITRATE 20 MG PO TABS: 60.0000 mg | ORAL_TABLET | Freq: Three times a day (TID) | ORAL | Status: DC

## 2019-08-08 MED ORDER — SILDENAFIL CITRATE 20 MG PO TABS
40.0000 mg | ORAL_TABLET | Freq: Three times a day (TID) | ORAL | Status: DC
  Filled 2019-08-08 (×3): qty 2

## 2019-08-08 MED ORDER — BACID PO TABS
2.0000 | ORAL_TABLET | Freq: Every day | ORAL | Status: DC
  Administered 2019-08-09 – 2019-08-10 (×2): 2 via ORAL
  Filled 2019-08-08 (×2): qty 2

## 2019-08-08 MED ORDER — UMECLIDINIUM BROMIDE 62.5 MCG/INH IN AEPB
1.0000 | INHALATION_SPRAY | Freq: Every day | RESPIRATORY_TRACT | Status: DC
  Administered 2019-08-10: 1 via RESPIRATORY_TRACT
  Filled 2019-08-08: qty 7

## 2019-08-08 MED ORDER — POTASSIUM CHLORIDE ER 10 MEQ PO TBCR
30.0000 meq | EXTENDED_RELEASE_TABLET | Freq: Every day | ORAL | Status: DC
  Filled 2019-08-08: qty 3

## 2019-08-08 MED ORDER — DULOXETINE HCL 60 MG PO CPEP
60.0000 mg | ORAL_CAPSULE | Freq: Every day | ORAL | Status: DC
  Administered 2019-08-08 – 2019-08-10 (×3): 60 mg via ORAL
  Filled 2019-08-08 (×3): qty 1

## 2019-08-08 MED ORDER — ACETAMINOPHEN 325 MG PO TABS
650.0000 mg | ORAL_TABLET | ORAL | Status: DC | PRN
  Administered 2019-08-09: 11:00:00 650 mg via ORAL
  Filled 2019-08-08: qty 2

## 2019-08-08 MED ORDER — SELEXIPAG 200 MCG PO TABS: 400.0000 ug | ORAL_TABLET | Freq: Two times a day (BID) | ORAL | Status: DC

## 2019-08-08 MED ORDER — MACITENTAN 10 MG PO TABS: 10.0000 mg | ORAL_TABLET | Freq: Every day | ORAL | Status: DC

## 2019-08-08 MED ORDER — SELEXIPAG 200 MCG PO TABS
600.0000 ug | ORAL_TABLET | Freq: Every day | ORAL | Status: DC
  Administered 2019-08-09: 23:00:00 600 ug via ORAL
  Filled 2019-08-08 (×2): qty 3

## 2019-08-08 MED ORDER — FUROSEMIDE 40 MG PO TABS: 60.0000 mg | ORAL_TABLET | Freq: Two times a day (BID) | ORAL | Status: DC

## 2019-08-08 MED ORDER — FUROSEMIDE 10 MG/ML IJ SOLN: 40.0000 mg | Freq: Two times a day (BID) | INTRAMUSCULAR | Status: DC

## 2019-08-08 MED ORDER — MIDODRINE HCL 5 MG PO TABS
5.0000 mg | ORAL_TABLET | Freq: Three times a day (TID) | ORAL | Status: DC
  Administered 2019-08-08 – 2019-08-10 (×6): 5 mg via ORAL
  Filled 2019-08-08 (×8): qty 1

## 2019-08-08 MED ORDER — SODIUM CHLORIDE 0.9 % IV SOLN: 250.0000 mL | INTRAVENOUS | Status: DC | PRN

## 2019-08-08 MED ORDER — SODIUM BICARBONATE 8.4 % IV SOLN
50.0000 meq | Freq: Once | INTRAVENOUS | Status: AC
  Administered 2019-08-08: 50 meq via INTRAVENOUS
  Filled 2019-08-08: qty 50

## 2019-08-08 MED ORDER — NOREPINEPHRINE 4 MG/250ML-% IV SOLN
2.0000 ug/min | INTRAVENOUS | Status: DC
  Filled 2019-08-08: qty 250

## 2019-08-08 MED ORDER — ADULT MULTIVITAMIN W/MINERALS CH
1.0000 | ORAL_TABLET | Freq: Every day | ORAL | Status: DC
  Administered 2019-08-09 – 2019-08-10 (×2): 1 via ORAL
  Filled 2019-08-08 (×3): qty 1

## 2019-08-08 MED ORDER — METHYLPREDNISOLONE SODIUM SUCC 125 MG IJ SOLR
125.0000 mg | Freq: Once | INTRAMUSCULAR | Status: AC
  Administered 2019-08-08: 03:00:00 125 mg via INTRAVENOUS
  Filled 2019-08-08: qty 2

## 2019-08-08 MED ORDER — SELEXIPAG 200 MCG PO TABS
400.0000 ug | ORAL_TABLET | Freq: Every morning | ORAL | Status: DC
  Administered 2019-08-09 – 2019-08-10 (×2): 400 ug via ORAL
  Filled 2019-08-08 (×2): qty 2

## 2019-08-08 MED ORDER — FUROSEMIDE 10 MG/ML IJ SOLN
40.0000 mg | Freq: Two times a day (BID) | INTRAMUSCULAR | Status: AC
  Administered 2019-08-08 – 2019-08-09 (×2): 40 mg via INTRAVENOUS
  Filled 2019-08-08 (×2): qty 4

## 2019-08-08 MED ORDER — IPRATROPIUM BROMIDE 0.06 % NA SOLN
2.0000 | Freq: Four times a day (QID) | NASAL | Status: DC | PRN
  Administered 2019-08-09: 2 via NASAL
  Filled 2019-08-08: qty 15

## 2019-08-08 MED ORDER — SODIUM CHLORIDE 0.9 % IV BOLUS
500.0000 mL | Freq: Once | INTRAVENOUS | Status: AC
  Administered 2019-08-08: 500 mL via INTRAVENOUS

## 2019-08-08 MED ORDER — FLUTICASONE-UMECLIDIN-VILANT 100-62.5-25 MCG/INH IN AEPB: 1.0000 | INHALATION_SPRAY | Freq: Every day | RESPIRATORY_TRACT | Status: DC

## 2019-08-08 MED ORDER — FUROSEMIDE 10 MG/ML IJ SOLN
40.0000 mg | Freq: Two times a day (BID) | INTRAMUSCULAR | Status: DC
  Administered 2019-08-08: 40 mg via INTRAVENOUS

## 2019-08-08 MED ORDER — TADALAFIL 20 MG PO TABS: 60.0000 mg | ORAL_TABLET | Freq: Two times a day (BID) | ORAL | Status: DC

## 2019-08-08 MED ORDER — ENOXAPARIN SODIUM 40 MG/0.4ML ~~LOC~~ SOLN
40.0000 mg | SUBCUTANEOUS | Status: DC
  Administered 2019-08-08 – 2019-08-09 (×2): 40 mg via SUBCUTANEOUS
  Filled 2019-08-08 (×2): qty 0.4

## 2019-08-08 MED ORDER — FUROSEMIDE 20 MG PO TABS
20.0000 mg | ORAL_TABLET | Freq: Every day | ORAL | Status: DC
  Administered 2019-08-08: 20 mg via ORAL
  Filled 2019-08-08: qty 1

## 2019-08-08 MED ORDER — TADALAFIL 20 MG PO TABS
40.0000 mg | ORAL_TABLET | Freq: Every day | ORAL | Status: DC
  Administered 2019-08-08 – 2019-08-10 (×3): 40 mg via ORAL
  Filled 2019-08-08 (×3): qty 2

## 2019-08-08 MED ORDER — FUROSEMIDE 20 MG PO TABS
60.0000 mg | ORAL_TABLET | Freq: Two times a day (BID) | ORAL | Status: DC
  Filled 2019-08-08: qty 3

## 2019-08-08 MED ORDER — SODIUM CHLORIDE 0.9% FLUSH: 3.0000 mL | INTRAVENOUS | Status: DC | PRN

## 2019-08-08 MED ORDER — ROSUVASTATIN CALCIUM 20 MG PO TABS
40.0000 mg | ORAL_TABLET | Freq: Every day | ORAL | Status: DC
  Administered 2019-08-09 – 2019-08-10 (×2): 40 mg via ORAL
  Filled 2019-08-08 (×3): qty 2

## 2019-08-08 MED ORDER — FUROSEMIDE 10 MG/ML IJ SOLN: 40.0000 mg | Freq: Once | INTRAMUSCULAR | Status: DC

## 2019-08-08 MED ORDER — MIRTAZAPINE 15 MG PO TABS
15.0000 mg | ORAL_TABLET | Freq: Every day | ORAL | Status: DC
  Administered 2019-08-08 – 2019-08-09 (×2): 15 mg via ORAL
  Filled 2019-08-08 (×3): qty 1

## 2019-08-08 MED ORDER — MACITENTAN 10 MG PO TABS
10.0000 mg | ORAL_TABLET | Freq: Every day | ORAL | Status: DC
  Administered 2019-08-08 – 2019-08-10 (×3): 10 mg via ORAL
  Filled 2019-08-08 (×3): qty 1

## 2019-08-08 MED ORDER — FLUTICASONE FUROATE-VILANTEROL 100-25 MCG/INH IN AEPB
1.0000 | INHALATION_SPRAY | Freq: Every day | RESPIRATORY_TRACT | Status: DC
  Administered 2019-08-08 – 2019-08-10 (×3): 1 via RESPIRATORY_TRACT
  Filled 2019-08-08: qty 28

## 2019-08-08 MED ORDER — SODIUM CHLORIDE 0.9 % IV BOLUS
1000.0000 mL | Freq: Once | INTRAVENOUS | Status: AC
  Administered 2019-08-08: 1000 mL via INTRAVENOUS

## 2019-08-08 MED ORDER — SELEXIPAG 200 MCG PO TABS: 600.0000 ug | ORAL_TABLET | Freq: Every day | ORAL | Status: DC

## 2019-08-08 MED ORDER — SPIRONOLACTONE 25 MG PO TABS
25.0000 mg | ORAL_TABLET | Freq: Every day | ORAL | Status: DC
  Administered 2019-08-08 – 2019-08-09 (×2): 25 mg via ORAL
  Filled 2019-08-08 (×2): qty 1

## 2019-08-08 MED ORDER — LORAZEPAM 0.5 MG PO TABS: 0.5000 mg | ORAL_TABLET | Freq: Every day | ORAL | Status: DC | PRN

## 2019-08-08 MED ORDER — CLONAZEPAM 0.5 MG PO TABS
0.5000 mg | ORAL_TABLET | Freq: Three times a day (TID) | ORAL | Status: DC | PRN
  Administered 2019-08-08: 0.5 mg via ORAL
  Filled 2019-08-08: qty 1

## 2019-08-08 MED ORDER — ONDANSETRON HCL 4 MG/2ML IJ SOLN
4.0000 mg | Freq: Four times a day (QID) | INTRAMUSCULAR | Status: DC | PRN
  Administered 2019-08-09: 4 mg via INTRAVENOUS
  Filled 2019-08-08: qty 2

## 2019-08-08 MED ORDER — SODIUM CHLORIDE 0.9 % IV SOLN
250.0000 mL | INTRAVENOUS | Status: DC
  Administered 2019-08-08: 250 mL via INTRAVENOUS

## 2019-08-08 MED ORDER — FUROSEMIDE 20 MG PO TABS: 60.0000 mg | ORAL_TABLET | Freq: Two times a day (BID) | ORAL | Status: DC

## 2019-08-08 MED ORDER — IPRATROPIUM BROMIDE 0.06 % NA SOLN
2.0000 | Freq: Four times a day (QID) | NASAL | Status: DC
  Filled 2019-08-08: qty 15

## 2019-08-08 MED ORDER — ALBUTEROL SULFATE HFA 108 (90 BASE) MCG/ACT IN AERS
6.0000 | INHALATION_SPRAY | Freq: Once | RESPIRATORY_TRACT | Status: AC
  Administered 2019-08-08: 03:00:00 6 via RESPIRATORY_TRACT
  Filled 2019-08-08: qty 6.7

## 2019-08-08 MED ORDER — AZELASTINE-FLUTICASONE 137-50 MCG/ACT NA SUSP: 1.0000 | Freq: Every day | NASAL | Status: DC | PRN

## 2019-08-08 MED ORDER — ASPIRIN EC 81 MG PO TBEC
81.0000 mg | DELAYED_RELEASE_TABLET | Freq: Every evening | ORAL | Status: DC
  Administered 2019-08-08 – 2019-08-09 (×2): 81 mg via ORAL
  Filled 2019-08-08 (×2): qty 1

## 2019-08-08 NOTE — Consult Note (Signed)
   NAME:  [name], MRN:  [id number], DOB:  May 16, 1946, LOS: 0 ADMISSION DATE:  08/08/2019, CONSULTATION DATE:  08/08/19 REFERRING MD:  ER, CHIEF COMPLAINT:  Hip pain   Brief History   73 year old man with advanced PAH presenting after fall with hip pain.  History of present illness   73 year old man with advanced PAH presenting after fall with hip pain. Last LHC showing severe disease (wedge 9, PVR 13 woods units, CI 1.8).  Has groups 1/3 disease on triple home oral therapy.  PFTs 2018 showing preserved FEV1, FVC, but markedly reduced DLCO.  CT with severe emphysema.  Has R hip pain, limited ROM, x-ray looks like a fracture but final read pending. Low Bps and low sats prompted ICU evaluation. Patient states breathing is at baseline, denies chest pain or dizziness.  ROS as below  Past Medical History  Cor pulmonale and end stage PAH Anxiety/depression COPD Pulmonary cachexia  Significant Hospital Events   08/08/19  Consults:  ?Ortho  Procedures:  N/A  Significant Diagnostic Tests:  Hip x-ray >> Lactate 5>>3  Micro Data:  COVID neg  Antimicrobials:  None   Interim history/subjective:  Admitted  Objective   Blood pressure (!) 84/58, pulse 67, temperature (!) 97.4 F (36.3 C), temperature source Axillary, resp. rate 16, height _0  (1.803 m), weight 75 kg, SpO2 99 %.       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 Local block - Restart home PAH meds: sildenafil (lower dose given hypotension), macitentan, patient may need to bring uptravi from home - Restart home lasix half dose - Breo, incruse in lieu of trelegy (not on our formulary) - Trial of midodrine low dose - Continue home O2, use forehead probe, his peripheral sats are not reliable - Should be fine for stepdown as I do not think he needs pressors and breathing status is baseline - Will follow with you  Erskine Emery MD PCCM

## 2019-08-08 NOTE — ED Notes (Signed)
Patient transported to X-ray

## 2019-08-08 NOTE — Progress Notes (Signed)
  Echocardiogram 2D Echocardiogram has been performed.  Jennette Dubin 08/08/2019, 11:17 AM

## 2019-08-08 NOTE — ED Provider Notes (Addendum)
[facility] EMERGENCY DEPARTMENT Provider Note   CSN: [id number] Arrival date & time: 08/08/19  1638     History   Chief Complaint Chief Complaint  Patient presents with  . Shortness of Breath  . Fall    HPI Nikolas Casher Jaco is a 73 y.o. male.     Patient is a 73 year old male with past medical history of pulmonary hypertension on home oxygen, hyperlipidemia, emphysema.  He is brought by EMS for evaluation of shortness of breath.  Patient reports the onset of difficulty breathing earlier today and this has worsened rapidly.  This evening he became short of breath and dizzy with ambulation, causing him to fall.  He was helped back into bed by his assisted living facility staff, then EMS was called.  He was initially profoundly hypoxic with oxygen saturations in the 60's.  He denies chest pain, fevers, or chills.  He does have some leg swelling that is somewhat worse than baseline.  He denies any definite exposures to COVID-19.  The history is provided by the patient and the EMS personnel.  Shortness of Breath Severity:  Moderate Onset quality:  Sudden Duration:  12 hours Timing:  Constant Progression:  Worsening Chronicity:  Recurrent Context: activity   Context: not URI   Relieved by:  Nothing Worsened by:  Nothing Ineffective treatments:  None tried Associated symptoms: no fever and no sputum production     Past Medical History:  Diagnosis Date  . CAD (coronary artery disease)   . GAD (generalized anxiety disorder)   . Hyperlipidemia   . Overweight(278.02)   . Pulmonary hypertension [facility])     Patient Active Problem List   Diagnosis Date Noted  . Protein calorie malnutrition ([place]) 04/28/2017  . Thrombocytopenia ([place]) 04/28/2017  . Centrilobular emphysema ([place]) 02/12/2017  . Pulmonary hypertension ([place]) 02/12/2017  . Chronic respiratory failure with hypoxia ([place]) 02/12/2017  . Acute stress disorder 12/07/2016  . Allergic rhinitis due to pollen  12/07/2016  . Angina pectoris ([place]) 12/07/2016  . Anxiety 12/07/2016  . Cardiac arrhythmia 12/07/2016  . Insomnia 12/07/2016  . Low back pain 12/07/2016  . Obesity 12/07/2016  . Raynaud phenomenon 12/07/2016  . Seborrheic dermatitis 12/07/2016  . Umbilical hernia 46/65/9935  . Dyspnea on exertion 09/24/2016  . Essential hypertension, benign 11/06/2013  . CAD (coronary artery disease)   . GAD (generalized anxiety disorder)   . Hyperlipidemia   . Overweight(278.02)     Past Surgical History:  Procedure Laterality Date  . ANAL FISSURE REPAIR    . CARDIAC CATHETERIZATION  2005  . RIGHT/LEFT HEART CATH AND CORONARY ANGIOGRAPHY N/A 11/24/2016   Procedure: Right/Left Heart Cath and Coronary Angiography;  Surgeon: Jettie Booze, MD;  Location: [facility] CV LAB;  Service: Cardiovascular;  Laterality: N/A;  . SKIN CANCER EXCISION    . TONSILLECTOMY  1955  . UMBILICAL HERNIA REPAIR  10/07/2011   Procedure: HERNIA REPAIR UMBILICAL ADULT;  Surgeon: Rolm Bookbinder, MD;  Location: [facility] ORS;  Service: General;  Laterality: N/A;  with mesh         Home Medications    Prior to Admission medications   Medication Sig Start Date End Date Taking? Authorizing Provider  aspirin 81 MG tablet Take 81 mg by mouth every evening.     [provider]  Azelastine-Fluticasone (DYMISTA) 137-50 MCG/ACT SUSP Place 1-2 sprays into the nose daily as needed. 12/08/18   Parrett, Fonnie Mu, NP  clonazePAM (KLONOPIN) 0.5 MG tablet Take 0.5 mg  3 (three) times daily as needed by mouth for anxiety.  10/10/14   [provider]  Coenzyme Q10 (VITALINE COQ10) 100 MG WAFR Take 60 mg by mouth daily.    [provider]  DULoxetine (CYMBALTA) 30 MG capsule TK 1 TO 2 CS PO QD 06/23/17   [provider]  Fluticasone-Umeclidin-Vilant (TRELEGY ELLIPTA) 100-62.5-25 MCG/INH AEPB Inhale 1 puff into the lungs daily. 06/14/19   Parrett, Fonnie Mu, NP  furosemide (LASIX) 20 MG tablet Take by mouth.     [provider]  ipratropium (ATROVENT) 0.06 % nasal spray Place 2 sprays into both nostrils 4 (four) times daily as needed for rhinitis. Patient not taking: Reported on 06/21/2019 04/19/18   Juanito Doom, MD  Lactobacillus (ACIDOPHILUS PO) Take 1 capsule by mouth daily.     [provider]  Macitentan (OPSUMIT) 10 MG TABS Take 1 tablet (10 mg total) by mouth daily. 01/21/17   Bensimhon, Shaune Pascal, MD  mirtazapine (REMERON) 15 MG tablet Take 15 mg by mouth at bedtime.    [provider]  Multiple Vitamins-Minerals (CENTRUM CARDIO PO) Take 1 tablet by mouth daily.     [provider]  potassium chloride (KLOR-CON) 10 MEQ tablet Take by mouth. 12/01/18 12/01/19  [provider]  rosuvastatin (CRESTOR) 40 MG tablet Take 1 tablet (40 mg total) by mouth daily. Please make overdue appt with Dr. Irish Lack before anymore refills. 2nd attempt 07/07/18   Jettie Booze, MD  sildenafil (REVATIO) 20 MG tablet Take 60 mg by mouth 3 (three) times daily.  03/28/18   [provider]  spironolactone (ALDACTONE) 25 MG tablet  06/10/19   [provider]  UPTRAVI 200 MCG TABS  06/19/19   [provider]    Family History Family History  Problem Relation Age of Onset  . Cancer Sister        breast  . Heart disease Father   . Heart attack Father   . Stroke Father     Social History Social History   Tobacco Use  . Smoking status: Former Smoker    Packs/day: 1.00    Years: 18.00    Pack years: 18.00    Quit date: 09/27/1985    Years since quitting: 33.8  . Smokeless tobacco: Never Used  Substance Use Topics  . Alcohol use: Yes  . Drug use: No     Allergies   Penicillins; Nitrates, organic; and Symbicort [budesonide-formoterol fumarate]   Review of Systems Review of Systems  Constitutional: Negative for fever.  Respiratory: Negative for sputum production.   All other systems reviewed and are negative.    Physical  Exam Updated Vital Signs Ht _0  (1.803 m)   Wt 75 kg   BMI 23.06 kg/m   Physical Exam Vitals signs and nursing note reviewed.  Constitutional:      General: He is not in acute distress.    Appearance: He is well-developed. He is not diaphoretic.  HENT:     Head: Normocephalic and atraumatic.  Neck:     Musculoskeletal: Normal range of motion and neck supple.  Cardiovascular:     Rate and Rhythm: Normal rate and regular rhythm.     Heart sounds: No murmur. No friction rub.  Pulmonary:     Effort: Pulmonary effort is normal. No respiratory distress.     Breath sounds: Normal breath sounds. No wheezing or rales.  Abdominal:     General: Bowel sounds are normal. There is no  distension.     Palpations: Abdomen is soft.     Tenderness: There is no abdominal tenderness.  Musculoskeletal: Normal range of motion.     Right lower leg: Edema present.     Left lower leg: Edema present.     Comments: There is 2+ pitting edema of both lower extremities.  Skin:    General: Skin is warm and dry.  Neurological:     Mental Status: He is alert and oriented to person, place, and time.     Coordination: Coordination normal.      ED Treatments / Results  Labs (all labs ordered are listed, but only abnormal results are displayed) Labs Reviewed  COMPREHENSIVE METABOLIC PANEL  CBC WITH DIFFERENTIAL/PLATELET  BRAIN NATRIURETIC PEPTIDE  BLOOD GAS, ARTERIAL  LACTIC ACID, PLASMA  TROPONIN I (HIGH SENSITIVITY)    EKG EKG Interpretation  Date/Time:  Tuesday August 08 2019 02:40:15 EST Ventricular Rate:  79 PR Interval:    QRS Duration: 119 QT Interval:  404 QTC Calculation: 464 R Axis:   -99 Text Interpretation: Sinus rhythm Atrial premature complex Prolonged PR interval Nonspecific IVCD with LAD Anterolateral infarct, age indeterminate Confirmed by Veryl Speak [phone]) on 08/08/2019 2:51:06 AM   Radiology No results found.  Procedures Procedures (including critical care  time)  Medications Ordered in ED Medications  methylPREDNISolone sodium succinate (SOLU-MEDROL) 125 mg/2 mL injection 125 mg (has no administration in time range)  albuterol (VENTOLIN HFA) 108 (90 Base) MCG/ACT inhaler 6 puff (has no administration in time range)     Initial Impression / Assessment and Plan / ED Course  I have reviewed the triage vital signs and the nursing notes.  Pertinent labs & imaging results that were available during my care of the patient were reviewed by me and considered in my medical decision making (see chart for details).  Patient with history of pulmonary hypertension presenting with complaints of worsening shortness of breath and weakness.  Patient apparently fell prior to coming here but appears uninjured.  He was found to be profoundly hypoxic by EMS with oxygen saturations in the 60s.  He was placed on nonrebreather and transported here.  Patient hypotensive upon arrival.  From what the patient tells me, his blood pressure normally runs in the 38-937 systolic range.  Blood pressure here has been consistently in the low 80s.  Vitals are otherwise stable.  Laboratory studies returned showing worsening renal function, elevated BNP, and lactate of 5.4.  Chest x-ray shows cardiomegaly and possible underlying pericardial effusion.  Bedside ultrasound performed shows no evidence for large effusion or tamponade.  Patient does have a bump in his creatinine above baseline.  Patient hydrated with normal saline without much change.    Patient seen by critical care.  Rearrangement of the pulse oximetry probe from his fingers to his forehead has revealed an improvement in his oxygen saturations.  Patient states that he has bad circulation to his fingers and has informed them that it is impossible to get good readings from his fingers.  He told the critical care team that his breathing issues are chronic and does not feel any worse than normal.  The main reason he has  come in today is because of the fall.  He is complaining of pain in his right hip.  Patient will undergo x-rays of this hip.  Final disposition pending results of the xrays.  Care signed out to Dr. Sherry Ruffing at shift change.  He will obtain the results and determine the  final disposition.   CRITICAL CARE Performed by: Veryl Speak Total critical care time: 45 minutes Critical care time was exclusive of separately billable procedures and treating other patients. Critical care was necessary to treat or prevent imminent or life-threatening deterioration. Critical care was time spent personally by me on the following activities: development of treatment plan with patient and/or surrogate as well as nursing, discussions with consultants, evaluation of patient's response to treatment, examination of patient, obtaining history from patient or surrogate, ordering and performing treatments and interventions, ordering and review of laboratory studies, ordering and review of radiographic studies, pulse oximetry and re-evaluation of patient's condition.   Final Clinical Impressions(s) / ED Diagnoses   Final diagnoses:  None    ED Discharge Orders    None       Veryl Speak, MD 08/08/19 [id number]    Veryl Speak, MD 08/08/19 [phone]

## 2019-08-08 NOTE — Plan of Care (Signed)

## 2019-08-08 NOTE — ED Triage Notes (Signed)
Patient arrived with EMS from [facility] assisted living , staff reports SOB this evening and fell , no injury /denies LOC , CBG = 250 , O2 sat at facility is 60's , NRB mask applied prior to arrival .

## 2019-08-08 NOTE — Progress Notes (Signed)
Lower extremity venous has been completed.   Preliminary results in CV Proc.   Abram Sander 08/08/2019 8:49 AM

## 2019-08-08 NOTE — ED Notes (Signed)
Wife called, says pt sees [email or web address] at [facility] if needed have drs # and pager #

## 2019-08-08 NOTE — Consult Note (Signed)
Reason for Consult:Left hip fx  Referring Physician: C Tegeler  [name] is an 72 y.o. male.  HPI: [name] was walking towards the kitchen and fell. He's unsure if he tripped on a rug or on his own feet but fell half onto his couch and then onto the floor. This happened last night. He had some right hip pain/weakness and could not get up or ambulate but was able to be helped to bed. This morning he had an episode with SOB and decreased BP and was brought to the ED. As he was having hip pain still x-rays were ordered which showed a ND femoral neck fx and orthopedic surgery was consulted. He has severe pulmonary HTN and is an extremely high-risk surgical candidate.  Past Medical History:  Diagnosis Date  . CAD (coronary artery disease)   . GAD (generalized anxiety disorder)   . Hyperlipidemia   . Overweight(278.02)   . Pulmonary hypertension ([place])     Past Surgical History:  Procedure Laterality Date  . ANAL FISSURE REPAIR    . CARDIAC CATHETERIZATION  2005  . RIGHT/LEFT HEART CATH AND CORONARY ANGIOGRAPHY N/A 11/24/2016   Procedure: Right/Left Heart Cath and Coronary Angiography;  Surgeon: Jettie Booze, MD;  Location: [facility];  Service: Cardiovascular;  Laterality: N/A;  . SKIN CANCER EXCISION    . TONSILLECTOMY  1955  . UMBILICAL HERNIA REPAIR  10/07/2011   Procedure: HERNIA REPAIR UMBILICAL ADULT;  Surgeon: Rolm Bookbinder, MD;  Location: [facility];  Service: General;  Laterality: N/A;  with mesh     Family History  Problem Relation Age of Onset  . Cancer Sister        breast  . Heart disease Father   . Heart attack Father   . Stroke Father     Social History:  reports that he quit smoking about 33 years ago. He has a 18.00 pack-year smoking history. He has never used smokeless tobacco. He reports current alcohol use. He reports that he does not use drugs.  Allergies:  Allergies  Allergen Reactions  . Penicillins Hives    Has patient had a PCN reaction  causing immediate rash, facial/tongue/throat swelling, SOB or lightheadedness with hypotension: No Has patient had a PCN reaction causing severe rash involving mucus membranes or skin necrosis: Yes Has patient had a PCN reaction that required hospitalization Unknown Has patient had a PCN reaction occurring within the last 10 years: No If all of the above answers are "NO", then may proceed with Cephalosporin use.   . Nitrates, Organic   . Symbicort [Budesonide-Formoterol Fumarate] Other (See Comments) and Cough    Severe cough    Medications: I have reviewed the patient's current medications.  Results for orders placed or performed during the hospital encounter of 08/08/19 (from the past 48 hour(s))  Comprehensive metabolic panel     Status: Abnormal   Collection Time: 08/08/19  2:45 AM  Result Value Ref Range   Sodium 136 135 - 145 mmol/L   Potassium 5.6 (H) 3.5 - 5.1 mmol/L   Chloride 101 98 - 111 mmol/L   CO2 18 (L) 22 - 32 mmol/L   Glucose, Bld 157 (H) 70 - 99 mg/dL   BUN 47 (H) 8 - 23 mg/dL   Creatinine, Ser 2.09 (H) 0.61 - 1.24 mg/dL   Calcium 8.6 (L) 8.9 - 10.3 mg/dL   Total Protein 6.7 6.5 - 8.1 g/dL   Albumin 3.4 (L) 3.5 - 5.0 g/dL  AST 64 (H) 15 - 41 U/L   ALT 32 0 - 44 U/L   Alkaline Phosphatase 144 (H) 38 - 126 U/L   Total Bilirubin 2.2 (H) 0.3 - 1.2 mg/dL   GFR calc non Af Amer 30 (L) >60 mL/min   GFR calc Af Amer 35 (L) >60 mL/min   Anion gap 17 (H) 5 - 15    Comment: Performed at [facility] [address], [place], [place] [address]  CBC with Differential     Status: Abnormal   Collection Time: 08/08/19  2:45 AM  Result Value Ref Range   WBC 7.7 4.0 - 10.5 K/uL   RBC 5.27 4.22 - 5.81 MIL/uL   Hemoglobin 16.3 13.0 - 17.0 g/dL   HCT 50.5 39.0 - 52.0 %   MCV 95.8 80.0 - 100.0 fL   MCH 30.9 26.0 - 34.0 pg   MCHC 32.3 30.0 - 36.0 g/dL   RDW 17.9 (H) 11.5 - 15.5 %   Platelets 102 (L) 150 - 400 K/uL    Comment: REPEATED TO VERIFY PLATELET COUNT  CONFIRMED BY SMEAR Immature Platelet Fraction may be clinically indicated, consider ordering this additional test [id number]    nRBC 0.0 0.0 - 0.2 %   Neutrophils Relative % 82 %   Neutro Abs 6.3 1.7 - 7.7 K/uL   Lymphocytes Relative 6 %   Lymphs Abs 0.5 (L) 0.7 - 4.0 K/uL   Monocytes Relative 11 %   Monocytes Absolute 0.8 0.1 - 1.0 K/uL   Eosinophils Relative 0 %   Eosinophils Absolute 0.0 0.0 - 0.5 K/uL   Basophils Relative 0 %   Basophils Absolute 0.0 0.0 - 0.1 K/uL   Immature Granulocytes 1 %   Abs Immature Granulocytes 0.05 0.00 - 0.07 K/uL    Comment: Performed at [facility], [place] [address], [place], [place] [address]  Troponin I (High Sensitivity)     Status: Abnormal   Collection Time: 08/08/19  2:45 AM  Result Value Ref Range   Troponin I (High Sensitivity) 28 (H) <18 ng/L    Comment: (NOTE) Elevated high sensitivity troponin I (hsTnI) values and significant  changes across serial measurements may suggest ACS but many other  chronic and acute conditions are known to elevate hsTnI results.  Refer to the "Links" section for chest pain algorithms and additional  guidance. Performed at [facility], [place] [address], [place], [place] [address]   Brain natriuretic peptide     Status: Abnormal   Collection Time: 08/08/19  2:45 AM  Result Value Ref Range   B Natriuretic Peptide 1,046.6 (H) 0.0 - 100.0 pg/mL    Comment: Performed at [facility] [address], [place], [place] [address]  Lactate     Status: Abnormal   Collection Time: 08/08/19  2:45 AM  Result Value Ref Range   Lactic Acid, Venous 5.4 (HH) 0.5 - 1.9 mmol/L    Comment: CRITICAL RESULT CALLED TO, READ BACK BY AND VERIFIED WITH: Hope Pigeon B,RN 08/08/19 0331 WAYK Performed at [facility], [place] [address] [place], [place] [address]   POC SARS Coronavirus 2 Ag-ED - Nasal Swab (BD Veritor Kit)     Status: None   Collection Time: 08/08/19  3:35 AM  Result Value Ref Range   SARS  Coronavirus 2 Ag NEGATIVE NEGATIVE    Comment: (NOTE) SARS-CoV-2 antigen NOT DETECTED.  Negative results are presumptive.  Negative results do not preclude SARS-CoV-2 infection  and should not be used as the sole basis for treatment or other patient management decisions, including infection  control decisions, particularly in the presence of clinical signs and  symptoms consistent with COVID-19, or in those who have been in contact with the virus.  Negative results must be combined with clinical observations, patient history, and epidemiological information. The expected result is Negative. Fact Sheet for Patients: PodPark.tn Fact Sheet for Healthcare Providers: GiftContent.is This test is not yet approved or cleared by the [place] FDA and  has been authorized for detection and/or diagnosis of SARS-CoV-2 by FDA under an Emergency Use Authorization (EUA).  This EUA will remain in effect (meaning this test can be used) for the duration of  the COVID-19 de claration under Section 564(b)(1) of the Act, 21 U.S.C. section 360bbb-3(b)(1), unless the authorization is terminated or revoked sooner.   SARS CORONAVIRUS 2 (TAT 6-24 HRS) Nasopharyngeal Nasopharyngeal Swab     Status: None   Collection Time: 08/08/19  4:30 AM   Specimen: Nasopharyngeal Swab  Result Value Ref Range   SARS Coronavirus 2 NEGATIVE NEGATIVE    Comment: (NOTE) SARS-CoV-2 target nucleic acids are NOT DETECTED. The SARS-CoV-2 RNA is generally detectable in upper and lower respiratory specimens during the acute phase of infection. Negative results do not preclude SARS-CoV-2 infection, do not rule out co-infections with other pathogens, and should not be used as the sole basis for treatment or other patient management decisions. Negative results must be combined with clinical observations, patient history, and epidemiological information. The expected result  is Negative. Fact Sheet for Patients: SugarRoll.be Fact Sheet for Healthcare Providers: https://www.woods-mathews.com/ This test is not yet approved or cleared by the [place] FDA and  has been authorized for detection and/or diagnosis of SARS-CoV-2 by FDA under an Emergency Use Authorization (EUA). This EUA will remain  in effect (meaning this test can be used) for the duration of the COVID-19 declaration under Section 56 4(b)(1) of the Act, 21 U.S.C. section 360bbb-3(b)(1), unless the authorization is terminated or revoked sooner. Performed at [facility], [place] [address] [place], [place] [address]   Troponin I (High Sensitivity)     Status: Abnormal   Collection Time: 08/08/19  4:40 AM  Result Value Ref Range   Troponin I (High Sensitivity) 58 (H) <18 ng/L    Comment: RESULT CALLED TO, READ BACK BY AND VERIFIED WITH: Hope Pigeon B,RN 08/08/19 0552 WAYK Performed at [facility], [place] [address], [place], [place] [address]   Respiratory Panel by RT PCR (Flu A&B, Covid) - Nasopharyngeal Swab     Status: None   Collection Time: 08/08/19  5:26 AM   Specimen: Nasopharyngeal Swab  Result Value Ref Range   SARS Coronavirus 2 by RT PCR NEGATIVE NEGATIVE    Comment: (NOTE) SARS-CoV-2 target nucleic acids are NOT DETECTED. The SARS-CoV-2 RNA is generally detectable in upper respiratoy specimens during the acute phase of infection. The lowest concentration of SARS-CoV-2 viral copies this assay can detect is 131 copies/mL. A negative result does not preclude SARS-Cov-2 infection and should not be used as the sole basis for treatment or other patient management decisions. A negative result may occur with  improper specimen collection/handling, submission of specimen other than nasopharyngeal swab, presence of viral mutation(s) within the areas targeted by this assay, and inadequate number of viral copies (<131 copies/mL). A negative  result must be combined with clinical observations, patient history, and epidemiological information. The expected result is Negative. Fact  Sheet for Patients:  PinkCheek.be Fact Sheet for Healthcare Providers:  GravelBags.it This test is not yet ap proved or cleared by the [place] FDA and  has been authorized for detection and/or diagnosis of SARS-CoV-2 by FDA under an Emergency Use Authorization (EUA). This EUA will remain  in effect (meaning this test can be used) for the duration of the COVID-19 declaration under Section 564(b)(1) of the Act, 21 U.S.C. section 360bbb-3(b)(1), unless the authorization is terminated or revoked sooner.    Influenza A by PCR NEGATIVE NEGATIVE   Influenza B by PCR NEGATIVE NEGATIVE    Comment: (NOTE) The Xpert Xpress SARS-CoV-2/FLU/RSV assay is intended as an aid in  the diagnosis of influenza from Nasopharyngeal swab specimens and  should not be used as a sole basis for treatment. Nasal washings and  aspirates are unacceptable for Xpert Xpress SARS-CoV-2/FLU/RSV  testing. Fact Sheet for Patients: PinkCheek.be Fact Sheet for Healthcare Providers: GravelBags.it This test is not yet approved or cleared by the [place] FDA and  has been authorized for detection and/or diagnosis of SARS-CoV-2 by  FDA under an Emergency Use Authorization (EUA). This EUA will remain  in effect (meaning this test can be used) for the duration of the  Covid-19 declaration under Section 564(b)(1) of the Act, 21  U.S.C. section 360bbb-3(b)(1), unless the authorization is  terminated or revoked. Performed at [facility], [place] [address], [place], [place] [address]   Culture, blood (routine x 2)     Status: None (Preliminary result)   Collection Time: 08/08/19  5:55 AM   Specimen: BLOOD RIGHT WRIST  Result Value Ref Range   Specimen  Description BLOOD RIGHT WRIST    Special Requests      BOTTLES DRAWN AEROBIC AND ANAEROBIC Blood Culture adequate volume   Culture      NO GROWTH <12 HOURS Performed at [facility], [place] [address], [place], [place] [address]    Report Status PENDING   Procalcitonin - Baseline     Status: None   Collection Time: 08/08/19  6:00 AM  Result Value Ref Range   Procalcitonin 0.23 ng/mL    Comment:        Interpretation: PCT (Procalcitonin) <= 0.5 ng/mL: Systemic infection (sepsis) is not likely. Local bacterial infection is possible. (NOTE)       Sepsis PCT Algorithm           Lower Respiratory Tract                                      Infection PCT Algorithm    ----------------------------     ----------------------------         PCT < 0.25 ng/mL                PCT < 0.10 ng/mL         Strongly encourage             Strongly discourage   discontinuation of antibiotics    initiation of antibiotics    ----------------------------     -----------------------------       PCT 0.25 - 0.50 ng/mL            PCT 0.10 - 0.25 ng/mL               OR       >80% decrease in PCT  Discourage initiation of                                            antibiotics      Encourage discontinuation           of antibiotics    ----------------------------     -----------------------------         PCT >= 0.50 ng/mL              PCT 0.26 - 0.50 ng/mL               AND        <80% decrease in PCT             Encourage initiation of                                             antibiotics       Encourage continuation           of antibiotics    ----------------------------     -----------------------------        PCT >= 0.50 ng/mL                  PCT > 0.50 ng/mL               AND         increase in PCT                  Strongly encourage                                      initiation of antibiotics    Strongly encourage escalation           of antibiotics                                      -----------------------------                                           PCT <= 0.25 ng/mL                                                 OR                                        > 80% decrease in PCT                                     Discontinue / Do not initiate  antibiotics Performed at [facility], [place] [address] [place], [place] [address]   Lactic acid, plasma     Status: Abnormal   Collection Time: 08/08/19  6:00 AM  Result Value Ref Range   Lactic Acid, Venous 3.2 (HH) 0.5 - 1.9 mmol/L    Comment: CRITICAL VALUE NOTED.  VALUE IS CONSISTENT WITH PREVIOUSLY REPORTED AND CALLED VALUE. Performed at [facility], [place] [address], [place], [place] [address]   Culture, blood (routine x 2)     Status: None (Preliminary result)   Collection Time: 08/08/19  6:08 AM   Specimen: BLOOD  Result Value Ref Range   Specimen Description BLOOD RIGHT ANTECUBITAL    Special Requests      BOTTLES DRAWN AEROBIC AND ANAEROBIC Blood Culture adequate volume   Culture      NO GROWTH <12 HOURS Performed at [facility], [place] [address], [place], [place] [address]    Report Status PENDING   POCT I-Stat EG7     Status: Abnormal   Collection Time: 08/08/19  6:09 AM  Result Value Ref Range   pH, Ven 7.335 7.250 - 7.430   pCO2, Ven 34.8 (L) 44.0 - 60.0 mmHg   pO2, Ven 35.0 32.0 - 45.0 mmHg   Bicarbonate 18.5 (L) 20.0 - 28.0 mmol/L   TCO2 20 (L) 22 - 32 mmol/L   O2 Saturation 65.0 %   Acid-base deficit 6.0 (H) 0.0 - 2.0 mmol/L   Sodium 135 135 - 145 mmol/L   Potassium 5.4 (H) 3.5 - 5.1 mmol/L   Calcium, Ion 0.96 (L) 1.15 - 1.40 mmol/L   HCT 48.0 39.0 - 52.0 %   Hemoglobin 16.3 13.0 - 17.0 g/dL   Patient temperature HIDE    Sample type VENOUS    Comment NOTIFIED PHYSICIAN     Ct Hip Right Wo Contrast  Result Date: 08/08/2019 CLINICAL DATA:  Right hip pain secondary to a fall. EXAM: CT OF THE RIGHT HIP WITHOUT CONTRAST  TECHNIQUE: Multidetector CT imaging of the right hip was performed according to the standard protocol. Multiplanar CT image reconstructions were also generated. COMPARISON:  Radiographs dated 08/08/2019 FINDINGS: Bones/Joint/Cartilage There is a very subtle subcapital fracture of the right femoral neck with no displacement. The visualized pelvic bones are intact. Muscles and Tendons Normal. Soft tissues Extensive sigmoid diverticulosis. Slight edema in the subcutaneous fat of the buttocks and proximal right femur, indeterminate. IMPRESSION: Subtle subcapital fracture of the right femoral neck. Electronically Signed   By: Lorriane Shire M.D.   On: 08/08/2019 10:01   Dg Chest Port 1 View  Result Date: 08/08/2019 CLINICAL DATA:  Shortness of breath. EXAM: PORTABLE CHEST 1 VIEW COMPARISON:  04/28/2017 FINDINGS: Cardiomegaly, new from prior exam. There is a globular configuration of the cardiopericardial silhouette. Atherosclerosis of the thoracic aorta. No focal airspace disease. No evidence of pulmonary edema. Mild emphysema, apical predominant. No pleural effusion or pneumothorax. No acute osseous abnormalities are seen. IMPRESSION: 1. Cardiomegaly, progressed from 2018. Slightly globular configuration of the cardiopericardial silhouette, which can be seen with underlying pericardial effusion. Consider echocardiogram. 2. Emphysema. Aortic Atherosclerosis (ICD10-I70.0) and Emphysema (ICD10-J43.9). Electronically Signed   By: Keith Rake M.D.   On: 08/08/2019 02:55   Dg Hip Unilat W Or Wo Pelvis 2-3 Views Right  Result Date: 08/08/2019 CLINICAL DATA:  Fall with right hip pain EXAM: DG HIP (WITH OR WITHOUT PELVIS) 2-3V RIGHT COMPARISON:  None. FINDINGS: Limited assessment of the right femoral neck due  to inward rotation. No definite fracture, although there is subtle cortical overlapping at the lateral femoral neck. No dislocation. Osteopenia. IMPRESSION: Subtle cortical irregularity at the lateral femoral  neck, not definite for fracture. Depending on suspicion for fracture a CT or MRI could be obtained. Electronically Signed   By: Monte Fantasia M.D.   On: 08/08/2019 07:57   Vas [place] Lower Extremity Venous (dvt)  Result Date: 08/08/2019  Lower Venous Study Indications: Edema.  Comparison Study: no prior Performing Technologist: Abram Sander RVS  Examination Guidelines: A complete evaluation includes B-mode imaging, spectral Doppler, color Doppler, and power Doppler as needed of all accessible portions of each vessel. Bilateral testing is considered an integral part of a complete examination. Limited examinations for reoccurring indications may be performed as noted.  +---------+---------------+---------+-----------+----------+--------------+ RIGHT    CompressibilityPhasicitySpontaneityPropertiesThrombus Aging +---------+---------------+---------+-----------+----------+--------------+ CFV      Full           Yes      Yes                                 +---------+---------------+---------+-----------+----------+--------------+ SFJ      Full                                                        +---------+---------------+---------+-----------+----------+--------------+ FV Prox  Full                                                        +---------+---------------+---------+-----------+----------+--------------+ FV Mid   Full                                                        +---------+---------------+---------+-----------+----------+--------------+ FV DistalFull                                                        +---------+---------------+---------+-----------+----------+--------------+ PFV      Full                                                        +---------+---------------+---------+-----------+----------+--------------+ POP      Full           Yes      Yes                                  +---------+---------------+---------+-----------+----------+--------------+ PTV      Full                                                        +---------+---------------+---------+-----------+----------+--------------+  PERO                                                  Not visualized +---------+---------------+---------+-----------+----------+--------------+   +---------+---------------+---------+-----------+----------+--------------+ LEFT     CompressibilityPhasicitySpontaneityPropertiesThrombus Aging +---------+---------------+---------+-----------+----------+--------------+ CFV      Full           Yes      Yes                                 +---------+---------------+---------+-----------+----------+--------------+ SFJ      Full                                                        +---------+---------------+---------+-----------+----------+--------------+ FV Prox  Full                                                        +---------+---------------+---------+-----------+----------+--------------+ FV Mid   Full                                                        +---------+---------------+---------+-----------+----------+--------------+ FV DistalFull                                                        +---------+---------------+---------+-----------+----------+--------------+ PFV      Full                                                        +---------+---------------+---------+-----------+----------+--------------+ POP      Full           Yes      Yes                                 +---------+---------------+---------+-----------+----------+--------------+ PTV      Full                                                        +---------+---------------+---------+-----------+----------+--------------+ PERO                                                  Not visualized  +---------+---------------+---------+-----------+----------+--------------+  Summary: Right: There is no evidence of deep vein thrombosis in the lower extremity. No cystic structure found in the popliteal fossa. Left: There is no evidence of deep vein thrombosis in the lower extremity. No cystic structure found in the popliteal fossa.  *See table(s) above for measurements and observations.    Preliminary     Review of Systems  Constitutional: Negative for weight loss.  HENT: Negative for ear discharge, ear pain, hearing loss and tinnitus.   Eyes: Negative for blurred vision, double vision, photophobia and pain.  Respiratory: Positive for shortness of breath. Negative for cough and sputum production.   Cardiovascular: Negative for chest pain.  Gastrointestinal: Negative for abdominal pain, nausea and vomiting.  Genitourinary: Negative for dysuria, flank pain, frequency and urgency.  Musculoskeletal: Positive for joint pain (Right post hip). Negative for back pain, falls, myalgias and neck pain.  Neurological: Negative for dizziness, tingling, sensory change, focal weakness, loss of consciousness and headaches.  Endo/Heme/Allergies: Does not bruise/bleed easily.  Psychiatric/Behavioral: Negative for depression, memory loss and substance abuse. The patient is not nervous/anxious.    Blood pressure 98/65, pulse 69, temperature (!) 97.4 F (36.3 C), temperature source Axillary, resp. rate 18, height 5' 11" (1.803 m), weight 75 kg, SpO2 100 %. Physical Exam  Constitutional: He appears well-developed and well-nourished. No distress.  HENT:  Head: Normocephalic and atraumatic.  Eyes: Conjunctivae are normal. Right eye exhibits no discharge. Left eye exhibits no discharge. No scleral icterus.  Neck: Normal range of motion.  Cardiovascular: Normal rate and regular rhythm.  Respiratory: Effort normal. No respiratory distress.  Musculoskeletal:     Comments: LLE No traumatic wounds, ecchymosis, or  rash  Mod TTP posterior hip  No knee or ankle effusion  Knee stable to varus/ valgus and anterior/posterior stress  Sens DPN, SPN, TN intact  Motor EHL, ext, flex, evers 5/5  DP 1+, PT 1+, No significant edema  Neurological: He is alert.  Skin: Skin is warm and dry. He is not diaphoretic.  Psychiatric: He has a normal mood and affect. His behavior is normal.    Assessment/Plan: Left hip fx -- Given his high risk for surgery and the fact this is ND would recommend conservative management with NWB RLE. We discussed the possibility that this may complete regardless and then we would have to weigh risks involved with surgical vs medical management. He should f/u with Dr. Stann Mainland in the next 2 weeks for repeat x-rays. Multiple medical problems including pulmonary hypertension on home oxygen, hyperlipidemia, and emphysema -- per primary service    Lisette Abu, PA-C Orthopedic Surgery [phone] 08/08/2019, 12:00 PM

## 2019-08-08 NOTE — ED Notes (Signed)
Patient transported to CT 

## 2019-08-08 NOTE — H&P (Signed)
History and Physical    [name] DEY:[id number] DOB: [birth year] DOA: 08/08/2019  PCP: Lawerance Cruel, MD (Confirm with patient/family/NH records and if not entered, this has to be entered at [facility] point of entry) Patient coming from: independent living at [facility]  I have personally briefly reviewed patient's old medical records in [place]  Chief Complaint: leg pain after fall, increased SOB  HPI: [name] is a 73 y.o. male with medical history significant of  past medical history of severe pulmonary hypertension on home oxygen, hyperlipidemia, emphysema.  He is brought by EMS for evaluation of shortness of breath.  Patient reports the onset of difficulty breathing earlier today and this has worsened rapidly.  He reports tripping over his feet and tubing causing him to fall   He did have right hip pain. He was helped back into bed by his assisted living facility staff. Per his wife he had two episode of LOC when attempting to get up that were very brief in duration. Because of his leg pain, increased SOB and syncope EMS was called.  He was initially profoundly hypoxic with oxygen saturations in the 60's.  He denies chest pain, fevers, or chills.  He does have some leg swelling that is somewhat worse than baseline.  He denies any definite exposures to COVID-19.   ED Course: Patient in ED was mildly hypotensive and SOB, worse than baseline. He had xray suspicious for hip fx confirmed by CT revealing subtle subcapital Fx R femur. CXR revealed severe emphysema, question of pericardial effusion. Ortho was consulted: opined that patient did not require surgery. CCM was consult for hypoxemia- opined that patient was not needing ICU care or mechanical ventilatory support and recommended TRH admit to step-down for medical management with CCM to consult as needed.   Review of Systems: As per HPI otherwise 10 point review of systems negative.    Past Medical History:    Diagnosis Date   CAD (coronary artery disease)    GAD (generalized anxiety disorder)    Hyperlipidemia    Overweight(278.02)    Pulmonary hypertension ([place])     Past Surgical History:  Procedure Laterality Date   ANAL FISSURE REPAIR     CARDIAC CATHETERIZATION  2005   RIGHT/LEFT HEART CATH AND CORONARY ANGIOGRAPHY N/A 11/24/2016   Procedure: Right/Left Heart Cath and Coronary Angiography;  Surgeon: Jettie Booze, MD;  Location: [place] [facility];  Service: Cardiovascular;  Laterality: N/A;   SKIN CANCER EXCISION     TONSILLECTOMY  3149   UMBILICAL HERNIA REPAIR  10/07/2011   Procedure: HERNIA REPAIR UMBILICAL ADULT;  Surgeon: Rolm Bookbinder, MD;  Location: [facility];  Service: General;  Laterality: N/A;  with mesh    Social Hx: - College grad. 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 He was married twice, both ending in divorce. He had 4 children, eldest a girl and 3 sons. He has 6 grandchildren. He has been married to his third wife for 18 years. They Live at [facility].   reports that he quit smoking about 33 years ago. He has a 18.00 pack-year smoking history. He has never used smokeless tobacco. He reports current alcohol use. He reports that he does not use drugs.  Allergies  Allergen Reactions   Penicillins Hives    Has patient had a PCN reaction causing immediate rash, facial/tongue/throat swelling, SOB or  lightheadedness with hypotension: No Has patient had a PCN reaction causing severe rash involving mucus membranes or skin necrosis: Yes Has patient had a PCN reaction that required hospitalization Unknown Has patient had a PCN reaction occurring within the last 10 years: No If all of the above answers are "NO", then may proceed with Cephalosporin use.    Nitrates, Organic    Symbicort [Budesonide-Formoterol Fumarate] Other (See Comments) and Cough    Severe  cough    Family History  Problem Relation Age of Onset   Cancer Sister        breast   Heart disease Father    Heart attack Father    Stroke Father    Unacceptable: Noncontributory, unremarkable, or negative. Acceptable: Family history reviewed and not pertinent (If you reviewed it)  Prior to Admission medications   Medication Sig Start Date End Date Taking? Authorizing Provider  ammonium lactate (AMLACTIN) 12 % cream Apply 1 application topically daily as needed for dry skin. 05/16/19  Yes [provider]  aspirin 81 MG tablet Take 81 mg by mouth every evening.    Yes [provider]  Azelastine-Fluticasone (DYMISTA) 137-50 MCG/ACT SUSP Place 1-2 sprays into the nose daily as needed. Patient taking differently: Place 1-2 sprays into the nose daily as needed (allergies).  12/08/18  Yes Parrett, Tammy S, NP  clonazePAM (KLONOPIN) 0.5 MG tablet Take 0.5 mg 3 (three) times daily as needed by mouth for anxiety.  10/10/14  Yes [provider]  Coenzyme Q10 (VITALINE COQ10) 100 MG WAFR Take 60 mg by mouth daily.   Yes [provider]  DULoxetine (CYMBALTA) 60 MG capsule Take 60 mg by mouth daily.   Yes [provider]  Fluticasone-Umeclidin-Vilant (TRELEGY ELLIPTA) 100-62.5-25 MCG/INH AEPB Inhale 1 puff into the lungs daily. 06/14/19  Yes Parrett, Tammy S, NP  furosemide (LASIX) 20 MG tablet Take 60 mg by mouth daily.    Yes [provider]  ipratropium (ATROVENT) 0.06 % nasal spray Place 2 sprays into both nostrils 4 (four) times daily as needed for rhinitis. 04/19/18  Yes Juanito Doom, MD  Lactobacillus (ACIDOPHILUS PO) Take 1 capsule by mouth daily.    Yes [provider]  LORazepam (ATIVAN) 0.5 MG tablet Take 0.5 mg by mouth daily as needed for anxiety or sleep.   Yes [provider]  Macitentan (OPSUMIT) 10 MG TABS Take 1 tablet (10 mg total) by mouth daily. 01/21/17  Yes Bensimhon, Shaune Pascal, MD  mirtazapine  (REMERON) 15 MG tablet Take 15 mg by mouth at bedtime.   Yes [provider]  Multiple Vitamins-Minerals (CENTRUM CARDIO PO) Take 1 tablet by mouth daily.    Yes [provider]  potassium chloride (KLOR-CON) 10 MEQ tablet Take 30 mEq by mouth daily in the afternoon.  12/01/18 12/01/19 Yes [provider]  rosuvastatin (CRESTOR) 40 MG tablet Take 1 tablet (40 mg total) by mouth daily. Please make overdue appt with Dr. Irish Lack before anymore refills. 2nd attempt 07/07/18  Yes Jettie Booze, MD  spironolactone (ALDACTONE) 25 MG tablet Take 25 mg by mouth daily.  06/10/19  Yes [provider]  tadalafil (CIALIS) 20 MG tablet Take 60 mg by mouth 2 (two) times daily.   Yes [provider]  UPTRAVI 200 MCG TABS Take 400-600 mcg by mouth 2 (two) times daily. Take 2 tablets in the morning and 3 tablets at night 06/19/19  Yes [provider]  sildenafil (REVATIO) 20 MG tablet Take  60 mg by mouth 3 (three) times daily.  03/28/18   [provider]  triamcinolone cream (KENALOG) 0.1 % Apply 1 application topically daily as needed for irritation. 05/16/19   [provider]    Physical Exam: Vitals:   08/08/19 1500 08/08/19 1530 08/08/19 1600 08/08/19 1630  BP: _0 106/67  Pulse: 78 67 71 65  Resp: _1 Temp:      TempSrc:      SpO2: 100% 100% 100% 100%  Weight:      Height:        Constitutional: NAD, calm, comfortable Vitals:   08/08/19 1500 08/08/19 1530 08/08/19 1600 08/08/19 1630  BP: _2 106/67  Pulse: 78 67 71 65  Resp: _3 Temp:      TempSrc:      SpO2: 100% 100% 100% 100%  Weight:      Height:       Gneral appearance -  A gaunt, thing man in no distress. Eyes: PERRL, lids and conjunctivae normal Head - bald, temporal wasting noted. Oxygen censor on forehead 2/2 poor peripheral circulation. ENMT: Mucous membranes are moist. Posterior pharynx clear of any exudate or  lesions.Normal dentition.  Neck: normal, supple, no masses, no thyromegaly Respiratory: clear to auscultation bilaterally, no wheezing, no crackles. Normal respiratory effort. No accessory muscle use.  Cardiovascular: Regular rate and rhythm, no murmurs / rubs / gallops. 2+ extremity edema to below knees,. trace pedal pulses. No carotid bruits.  Abdomen: no tenderness, no masses palpated. No hepatosplenomegaly. Bowel sounds positive.  Musculoskeletal: Very thin.  no clubbing / cyanosis. Hammer toe deformities worse on the left foot. Good ROM, no contractures. Decreased muscle tone.  Skin: no rashes. Induration of distal LEs with erythema. No venous stasis ulcers Neurologic: CN 2-12 grossly intact. Sensation intact,   Psychiatric: Normal judgment and insight. Alert and oriented x 3. Normal mood.   (Anything < 9 systems with 2 bullets each down codes to level 1) (If patient refuses exam cant bill higher level) (Make sure to document decubitus ulcers present on admission -- if possible -- and whether patient has chronic indwelling catheter at time of admission)  Labs on Admission: I have personally reviewed following labs and imaging studies  CBC: Recent Labs  Lab 08/08/19 0245 08/08/19 0609  WBC 7.7  --   NEUTROABS 6.3  --   HGB 16.3 16.3  HCT 50.5 48.0  MCV 95.8  --   PLT 102*  --    Basic Metabolic Panel: Recent Labs  Lab 08/08/19 0245 08/08/19 0609  NA 136 135  K 5.6* 5.4*  CL 101  --   CO2 18*  --   GLUCOSE 157*  --   BUN 47*  --   CREATININE 2.09*  --   CALCIUM 8.6*  --    GFR: Estimated Creatinine Clearance: 33.4 mL/min (A) (by C-G formula based on SCr of 2.09 mg/dL (H)). Liver Function Tests: Recent Labs  Lab 08/08/19 0245  AST 64*  ALT 32  ALKPHOS 144*  BILITOT 2.2*  PROT 6.7  ALBUMIN 3.4*   No results for input(s): LIPASE, AMYLASE in the last 168 hours. No results for input(s): AMMONIA in the last 168 hours. Coagulation Profile: No results for  input(s): INR, PROTIME in the last 168 hours. Cardiac Enzymes: No results for input(s): CKTOTAL, CKMB, CKMBINDEX, TROPONINI in the last 168 hours. BNP (last 3 results) Recent Labs    08/19/18  0936  PROBNP 659.0*   HbA1C: No results for input(s): HGBA1C in the last 72 hours. CBG: No results for input(s): GLUCAP in the last 168 hours. Lipid Profile: No results for input(s): CHOL, HDL, LDLCALC, TRIG, CHOLHDL, LDLDIRECT in the last 72 hours. Thyroid Function Tests: No results for input(s): TSH, T4TOTAL, FREET4, T3FREE, THYROIDAB in the last 72 hours. Anemia Panel: No results for input(s): VITAMINB12, FOLATE, FERRITIN, TIBC, IRON, RETICCTPCT in the last 72 hours. Urine analysis:    Component Value Date/Time   COLORURINE AMBER (A) 08/08/2019 1238   APPEARANCEUR HAZY (A) 08/08/2019 1238   LABSPEC 1.011 08/08/2019 1238   PHURINE 5.0 08/08/2019 1238   GLUCOSEU NEGATIVE 08/08/2019 1238   HGBUR NEGATIVE 08/08/2019 1238   BILIRUBINUR NEGATIVE 08/08/2019 1238   [employer] 08/08/2019 1238   PROTEINUR 100 (A) 08/08/2019 1238   NITRITE NEGATIVE 08/08/2019 1238   LEUKOCYTESUR NEGATIVE 08/08/2019 1238    Radiological Exams on Admission: Ct Hip Right Wo Contrast  Result Date: 08/08/2019 CLINICAL DATA:  Right hip pain secondary to a fall. EXAM: CT OF THE RIGHT HIP WITHOUT CONTRAST TECHNIQUE: Multidetector CT imaging of the right hip was performed according to the standard protocol. Multiplanar CT image reconstructions were also generated. COMPARISON:  Radiographs dated 08/08/2019 FINDINGS: Bones/Joint/Cartilage There is a very subtle subcapital fracture of the right femoral neck with no displacement. The visualized pelvic bones are intact. Muscles and Tendons Normal. Soft tissues Extensive sigmoid diverticulosis. Slight edema in the subcutaneous fat of the buttocks and proximal right femur, indeterminate. IMPRESSION: Subtle subcapital fracture of the right femoral neck. Electronically  Signed   By: Lorriane Shire M.D.   On: 08/08/2019 10:01   Dg Chest Port 1 View  Result Date: 08/08/2019 CLINICAL DATA:  Shortness of breath. EXAM: PORTABLE CHEST 1 VIEW COMPARISON:  04/28/2017 FINDINGS: Cardiomegaly, new from prior exam. There is a globular configuration of the cardiopericardial silhouette. Atherosclerosis of the thoracic aorta. No focal airspace disease. No evidence of pulmonary edema. Mild emphysema, apical predominant. No pleural effusion or pneumothorax. No acute osseous abnormalities are seen. IMPRESSION: 1. Cardiomegaly, progressed from 2018. Slightly globular configuration of the cardiopericardial silhouette, which can be seen with underlying pericardial effusion. Consider echocardiogram. 2. Emphysema. Aortic Atherosclerosis (ICD10-I70.0) and Emphysema (ICD10-J43.9). Electronically Signed   By: Keith Rake M.D.   On: 08/08/2019 02:55   Dg Hip Unilat W Or Wo Pelvis 2-3 Views Right  Result Date: 08/08/2019 CLINICAL DATA:  Fall with right hip pain EXAM: DG HIP (WITH OR WITHOUT PELVIS) 2-3V RIGHT COMPARISON:  None. FINDINGS: Limited assessment of the right femoral neck due to inward rotation. No definite fracture, although there is subtle cortical overlapping at the lateral femoral neck. No dislocation. Osteopenia. IMPRESSION: Subtle cortical irregularity at the lateral femoral neck, not definite for fracture. Depending on suspicion for fracture a CT or MRI could be obtained. Electronically Signed   By: Monte Fantasia M.D.   On: 08/08/2019 07:57   Vas [place] Lower Extremity Venous (dvt)  Result Date: 08/08/2019  Lower Venous Study Indications: Edema.  Comparison Study: no prior Performing Technologist: Abram Sander RVS  Examination Guidelines: A complete evaluation includes B-mode imaging, spectral Doppler, color Doppler, and power Doppler as needed of all accessible portions of each vessel. Bilateral testing is considered an integral part of a complete examination. Limited  examinations for reoccurring indications may be performed as noted.  +---------+---------------+---------+-----------+----------+--------------+  RIGHT     Compressibility Phasicity Spontaneity Properties Thrombus Aging  +---------+---------------+---------+-----------+----------+--------------+  CFV  Full            Yes       Yes                                    +---------+---------------+---------+-----------+----------+--------------+  SFJ       Full                                                             +---------+---------------+---------+-----------+----------+--------------+  FV Prox   Full                                                             +---------+---------------+---------+-----------+----------+--------------+  FV Mid    Full                                                             +---------+---------------+---------+-----------+----------+--------------+  FV Distal Full                                                             +---------+---------------+---------+-----------+----------+--------------+  PFV       Full                                                             +---------+---------------+---------+-----------+----------+--------------+  POP       Full            Yes       Yes                                    +---------+---------------+---------+-----------+----------+--------------+  PTV       Full                                                             +---------+---------------+---------+-----------+----------+--------------+  PERO                                                       Not visualized  +---------+---------------+---------+-----------+----------+--------------+   +---------+---------------+---------+-----------+----------+--------------+  LEFT  Compressibility Phasicity Spontaneity Properties Thrombus Aging  +---------+---------------+---------+-----------+----------+--------------+  CFV       Full            Yes       Yes                                     +---------+---------------+---------+-----------+----------+--------------+  SFJ       Full                                                             +---------+---------------+---------+-----------+----------+--------------+  FV Prox   Full                                                             +---------+---------------+---------+-----------+----------+--------------+  FV Mid    Full                                                             +---------+---------------+---------+-----------+----------+--------------+  FV Distal Full                                                             +---------+---------------+---------+-----------+----------+--------------+  PFV       Full                                                             +---------+---------------+---------+-----------+----------+--------------+  POP       Full            Yes       Yes                                    +---------+---------------+---------+-----------+----------+--------------+  PTV       Full                                                             +---------+---------------+---------+-----------+----------+--------------+  PERO  Not visualized  +---------+---------------+---------+-----------+----------+--------------+     Summary: Right: There is no evidence of deep vein thrombosis in the lower extremity. No cystic structure found in the popliteal fossa. Left: There is no evidence of deep vein thrombosis in the lower extremity. No cystic structure found in the popliteal fossa.  *See table(s) above for measurements and observations.    Preliminary     EKG: Independently reviewed. SR, prolonged PR interval, IVCD, old anterolateral infarct.  Assessment/Plan Active Problems:   Acute diastolic CHF (congestive heart failure) (HCC)   GAD (generalized anxiety disorder)   Essential hypertension, benign   Pulmonary hypertension (HCC)    Chronic respiratory failure with hypoxia (HCC)   Diastolic heart failure ([place])  (please populate well all problems here in Problem List. (For example, if patient is on BP meds at home and you resume or decide to hold them, it is a problem that needs to be her. Same for CAD, COPD, HLD and so on)   1. Ortho - nondisplace fx subcapital right femur. Ortho recommends NWB RLE. Plan PT/OT eval  - will need guidance from ortho  2. Pul HTN - severe with chronic hypoxemia and oxygen dependent. Plan Continue home meds and oxygen support  3. HTN- patient on his current regimen reports that SBP in the low 90's is his baseline Plan  Continue home meds except see below for diuretic plan  4. Acute diastolic HF - patient with mildly increased hypoxemia and elevated BNP. Slight upward trend of Troponins likely demand related. 2D echo reveals no change in LVEF but increased diastolic dysfunction. All consistent with decompensated diastolic heart failure Plan Change to IV lasix 40 mg q 12 x 2 doses, then resume home dose of 60 mg bid.  Continue aldactone  F/u BNP in 24 hrs.  May need cardiology consult if lack of improvement  5. GAD - continue home meds  6. AKI - mild bump in Cr.  Plan  As above  F/u Bmet  7. Syncope - per wife's report. Suspect multi-factorial including pain of fx hip after fall. Neur exam unremarkable Plan CT head 12/10 r/o CVA   DVT prophylaxis: lovenox (Lovenox/Heparin/SCD's/anticoagulated/None (if comfort care) Code Status: DNR (Full/Partial (specify details) Family Communication: wife present during exam. Answered all questions. They understand the balance needed in treatment (Specify name, relationship. Do not write "discussed with patient". Specify tel # if discussed over the phone) Disposition Plan: to be determined (specify when and where you expect patient to be discharged) Consults called: Ortho has seen pt, CCM has seen patient: both available for consult (with  names) Admission status: inpatient-stepdown (inpatient / obs / tele / medical floor / SDU)   Adella Hare MD [facility] Pager [phone]  If 7PM-7AM, please contact night-coverage [email or web address] Password [facility]  08/08/2019, 4:47 PM

## 2019-08-08 NOTE — ED Notes (Signed)
Please call pts wife and update, her number is in the patients contacts.

## 2019-08-08 NOTE — ED Notes (Signed)
Dr Stark Jock informed of EG7 results

## 2019-08-08 NOTE — ED Notes (Signed)
Echo at bedside

## 2019-08-08 NOTE — ED Notes (Signed)
Admitting MD at bedside.

## 2019-08-08 NOTE — ED Notes (Signed)
RT unable to collect blood specimen for ABG , EDP notified .

## 2019-08-08 NOTE — ED Provider Notes (Signed)
7:38 AM Care assumed from Dr. Lanny Cramp.  At time of transfer of care, patient is awaiting results of urinalysis and x-ray of the hip to look for possible hip fracture or infection.  Patient was found to have worsened shortness of breath and worsened hypoxia on his home oxygen earlier and had a fall.  Patient symptoms began yesterday by report.  Patient already has some work-up returned showing initially lactic acid over 5 but is improved to 3.2.  His influenza and Covid test was negative.  Troponin however did go from 28-58 which is concerning as well as a BNP over 1000.  Creatinine is elevated at 2.09 which appears to be AKI compared to prior.  Chest x-ray did not show pneumonia but did show some abnormality of the cardiac silhouette which is slightly different than prior and could represent a pericardial effusion.  Ultrasound is recommended.  Patient was initially called for admission to medicine but then they requested critical care be involved.  Critical care reportedly came to the patient and as his blood pressures are reportedly normally in the 80s and 90s, they did not feel he needed critical care admission.  After x-ray and urinalysis has been completed, will call hospitalist team for admission.  Patient will likely need echo, troponin trending, possible diuresis, and monitoring of his oxygen status.  11:02 AM CT scan returned showing a small right femoral neck fracture.  Will call orthopedics will come see him.  I assessed the patient and he had pain and tenderness in his right hip and could not raise without pain.  He did have good sensation and strength in the foot.  Good pulses.   Due to the patient's AKI, continued low blood pressures, new hip fracture with decreased mobility, and fall, patient will need admission with critical care following to the hospital service.  Will wait for urinalysis to return.  And out catheter was ordered to get the urine before admission.  Urinalysis does not show  UTI.  Patient will be admitted for above symptoms.  Clinical Impression: 1. Respiratory distress   2. History of pulmonary hypertension   3. Closed fracture of neck of right femur, initial encounter ([place])     Disposition: Admit  This note was prepared with assistance of Dragon voice recognition software. Occasional wrong-word or sound-a-like substitutions may have occurred due to the inherent limitations of voice recognition software.     , Gwenyth Allegra, MD 08/08/19 [phone]

## 2019-08-08 NOTE — ED Notes (Signed)
ED TO INPATIENT HANDOFF REPORT  ED Nurse  Phone #: [phone]  S Name/Age/Gender [name] 73 y.o. male Room/Bed: TRAAC/TRAAC  Code Status   Code Status: Prior  SNF: [facility] {Patient oriented x4 Is this baseline? yes  Triage Complete: Triage complete  Chief Complaint cpap   Triage Note Patient arrived with EMS from [facility] , staff reports SOB this evening and fell , no injury /denies LOC , CBG = 250 , O2 sat at facility is 60's , NRB mask applied prior to arrival .    Allergies Allergies  Allergen Reactions  . Penicillins Hives    Has patient had a PCN reaction causing immediate rash, facial/tongue/throat swelling, SOB or lightheadedness with hypotension: No Has patient had a PCN reaction causing severe rash involving mucus membranes or skin necrosis: Yes Has patient had a PCN reaction that required hospitalization Unknown Has patient had a PCN reaction occurring within the last 10 years: No If all of the above answers are "NO", then may proceed with Cephalosporin use.   . Nitrates, Organic   . Symbicort [Budesonide-Formoterol Fumarate] Other (See Comments) and Cough    Severe cough    Level of Care/Admitting Diagnosis ED Disposition    None      B Medical/Surgery History Past Medical History:  Diagnosis Date  . CAD (coronary artery disease)   . GAD (generalized anxiety disorder)   . Hyperlipidemia   . Overweight(278.02)   . Pulmonary hypertension ([place])    Past Surgical History:  Procedure Laterality Date  . ANAL FISSURE REPAIR    . CARDIAC CATHETERIZATION  2005  . RIGHT/LEFT HEART CATH AND CORONARY ANGIOGRAPHY N/A 11/24/2016   Procedure: Right/Left Heart Cath and Coronary Angiography;  Surgeon: Jettie Booze, MD;  Location: [facility];  Service: Cardiovascular;  Laterality: N/A;  . SKIN CANCER EXCISION    . TONSILLECTOMY  1955  . UMBILICAL HERNIA REPAIR  10/07/2011   Procedure: HERNIA REPAIR  UMBILICAL ADULT;  Surgeon: Rolm Bookbinder, MD;  Location: [facility];  Service: General;  Laterality: N/A;  with mesh      A IV Location/Drains/Wounds Patient Lines/Drains/Airways Status   Active Line/Drains/Airways    Name:   Placement date:   Placement time:   Site:   Days:   Peripheral IV 08/08/19 Left Forearm   08/08/19    -    Forearm   less than 1   Peripheral IV 08/08/19 Right Antecubital   08/08/19    0301    Antecubital   less than 1   Sheath 11/24/16 Right Venous;Brachial   11/24/16    0751    Venous;Brachial   987   Incision 10/07/11 Abdomen   10/07/11    1058     2862          Intake/Output Last 24 hours No intake or output data in the 24 hours ending 08/08/19 0646  Labs/Imaging Results for orders placed or performed during the hospital encounter of 08/08/19 (from the past 48 hour(s))  Comprehensive metabolic panel     Status: Abnormal   Collection Time: 08/08/19  2:45 AM  Result Value Ref Range   Sodium 136 135 - 145 mmol/L   Potassium 5.6 (H) 3.5 - 5.1 mmol/L   Chloride 101 98 - 111 mmol/L   CO2 18 (L) 22 - 32 mmol/L   Glucose, Bld 157 (H) 70 - 99 mg/dL   BUN 47 (H) 8 - 23 mg/dL  Creatinine, Ser 2.09 (H) 0.61 - 1.24 mg/dL   Calcium 8.6 (L) 8.9 - 10.3 mg/dL   Total Protein 6.7 6.5 - 8.1 g/dL   Albumin 3.4 (L) 3.5 - 5.0 g/dL   AST 64 (H) 15 - 41 U/L   ALT 32 0 - 44 U/L   Alkaline Phosphatase 144 (H) 38 - 126 U/L   Total Bilirubin 2.2 (H) 0.3 - 1.2 mg/dL   GFR calc non Af Amer 30 (L) >60 mL/min   GFR calc Af Amer 35 (L) >60 mL/min   Anion gap 17 (H) 5 - 15    Comment: Performed at [facility] [address] [place], [place] [address]  CBC with Differential     Status: Abnormal   Collection Time: 08/08/19  2:45 AM  Result Value Ref Range   WBC 7.7 4.0 - 10.5 K/uL   RBC 5.27 4.22 - 5.81 MIL/uL   Hemoglobin 16.3 13.0 - 17.0 g/dL   HCT 50.5 39.0 - 52.0 %   MCV 95.8 80.0 - 100.0 fL   MCH 30.9 26.0 - 34.0 pg   MCHC 32.3 30.0 - 36.0 g/dL   RDW 17.9  (H) 11.5 - 15.5 %   Platelets 102 (L) 150 - 400 K/uL    Comment: REPEATED TO VERIFY PLATELET COUNT CONFIRMED BY SMEAR Immature Platelet Fraction may be clinically indicated, consider ordering this additional test [id number]    nRBC 0.0 0.0 - 0.2 %   Neutrophils Relative % 82 %   Neutro Abs 6.3 1.7 - 7.7 K/uL   Lymphocytes Relative 6 %   Lymphs Abs 0.5 (L) 0.7 - 4.0 K/uL   Monocytes Relative 11 %   Monocytes Absolute 0.8 0.1 - 1.0 K/uL   Eosinophils Relative 0 %   Eosinophils Absolute 0.0 0.0 - 0.5 K/uL   Basophils Relative 0 %   Basophils Absolute 0.0 0.0 - 0.1 K/uL   Immature Granulocytes 1 %   Abs Immature Granulocytes 0.05 0.00 - 0.07 K/uL    Comment: Performed at [facility], [place] [address] [place], [place] [address]  Troponin I (High Sensitivity)     Status: Abnormal   Collection Time: 08/08/19  2:45 AM  Result Value Ref Range   Troponin I (High Sensitivity) 28 (H) <18 ng/L    Comment: (NOTE) Elevated high sensitivity troponin I (hsTnI) values and significant  changes across serial measurements may suggest ACS but many other  chronic and acute conditions are known to elevate hsTnI results.  Refer to the "Links" section for chest pain algorithms and additional  guidance. Performed at [facility], [place] [address], [place], [place] [address]   Brain natriuretic peptide     Status: Abnormal   Collection Time: 08/08/19  2:45 AM  Result Value Ref Range   B Natriuretic Peptide 1,046.6 (H) 0.0 - 100.0 pg/mL    Comment: Performed at [facility] [address], [place],  [address]  Lactate     Status: Abnormal   Collection Time: 08/08/19  2:45 AM  Result Value Ref Range   Lactic Acid, Venous 5.4 (HH) 0.5 - 1.9 mmol/L    Comment: CRITICAL RESULT CALLED TO, READ BACK BY AND VERIFIED WITH: Hope Pigeon B,RN 08/08/19 0331 WAYK Performed at [facility], [place] [address] [place], [place] [address]   POC SARS Coronavirus 2 Ag-ED - Nasal Swab (BD  Veritor Kit)     Status: None   Collection Time: 08/08/19  3:35 AM  Result Value Ref Range   SARS Coronavirus 2 Ag NEGATIVE NEGATIVE    Comment: (NOTE) SARS-CoV-2 antigen NOT DETECTED.  Negative results are presumptive.  Negative results do not preclude SARS-CoV-2 infection and should not be used as the sole basis for treatment or other patient management decisions, including infection  control decisions, particularly in the presence of clinical signs and  symptoms consistent with COVID-19, or in those who have been in contact with the virus.  Negative results must be combined with clinical observations, patient history, and epidemiological information. The expected result is Negative. Fact Sheet for Patients: PodPark.tn Fact Sheet for Healthcare Providers: GiftContent.is This test is not yet approved or cleared by the [place] FDA and  has been authorized for detection and/or diagnosis of SARS-CoV-2 by FDA under an Emergency Use Authorization (EUA).  This EUA will remain in effect (meaning this test can be used) for the duration of  the COVID-19 de claration under Section 564(b)(1) of the Act, 21 U.S.C. section 360bbb-3(b)(1), unless the authorization is terminated or revoked sooner.   Troponin I (High Sensitivity)     Status: Abnormal   Collection Time: 08/08/19  4:40 AM  Result Value Ref Range   Troponin I (High Sensitivity) 58 (H) <18 ng/L    Comment: RESULT CALLED TO, READ BACK BY AND VERIFIED WITH: Hope Pigeon B,RN 08/08/19 0552 WAYK Performed at [facility], [place] [address], [place], [place] [address]   Respiratory Panel by RT PCR (Flu A&B, Covid) - Nasopharyngeal Swab     Status: None   Collection Time: 08/08/19  5:26 AM   Specimen: Nasopharyngeal Swab  Result Value Ref Range   SARS Coronavirus 2 by RT PCR NEGATIVE NEGATIVE    Comment: (NOTE) SARS-CoV-2 target nucleic acids are NOT DETECTED. The  SARS-CoV-2 RNA is generally detectable in upper respiratoy specimens during the acute phase of infection. The lowest concentration of SARS-CoV-2 viral copies this assay can detect is 131 copies/mL. A negative result does not preclude SARS-Cov-2 infection and should not be used as the sole basis for treatment or other patient management decisions. A negative result may occur with  improper specimen collection/handling, submission of specimen other than nasopharyngeal swab, presence of viral mutation(s) within the areas targeted by this assay, and inadequate number of viral copies (<131 copies/mL). A negative result must be combined with clinical observations, patient history, and epidemiological information. The expected result is Negative. Fact Sheet for Patients:  PinkCheek.be Fact Sheet for Healthcare Providers:  GravelBags.it This test is not yet ap proved or cleared by the [place] FDA and  has been authorized for detection and/or diagnosis of SARS-CoV-2 by FDA under an Emergency Use Authorization (EUA). This EUA will remain  in effect (meaning this test can be used) for the duration of the COVID-19 declaration under Section 564(b)(1) of the Act, 21 U.S.C. section 360bbb-3(b)(1), unless the authorization is terminated or revoked sooner.    Influenza A by PCR NEGATIVE NEGATIVE   Influenza B by PCR NEGATIVE NEGATIVE    Comment: (NOTE) The Xpert Xpress SARS-CoV-2/FLU/RSV assay is intended as an aid in  the diagnosis of influenza from Nasopharyngeal swab specimens and  should not be used as a sole basis for treatment. Nasal washings and  aspirates are unacceptable for Xpert Xpress SARS-CoV-2/FLU/RSV  testing. Fact Sheet for Patients: PinkCheek.be Fact Sheet for Healthcare Providers: GravelBags.it This test is not yet approved or cleared by the [place]  and  has been authorized  for detection and/or diagnosis of SARS-CoV-2 by  FDA under an Emergency Use Authorization (EUA). This EUA will remain  in effect (meaning this test can be used) for the duration of the  Covid-19 declaration under Section 564(b)(1) of the Act, 21  U.S.C. section 360bbb-3(b)(1), unless the authorization is  terminated or revoked. Performed at [facility], [place] [address], [place], [place] [address]   POCT I-Stat EG7     Status: Abnormal   Collection Time: 08/08/19  6:09 AM  Result Value Ref Range   pH, Ven 7.335 7.250 - 7.430   pCO2, Ven 34.8 (L) 44.0 - 60.0 mmHg   pO2, Ven 35.0 32.0 - 45.0 mmHg   Bicarbonate 18.5 (L) 20.0 - 28.0 mmol/L   TCO2 20 (L) 22 - 32 mmol/L   O2 Saturation 65.0 %   Acid-base deficit 6.0 (H) 0.0 - 2.0 mmol/L   Sodium 135 135 - 145 mmol/L   Potassium 5.4 (H) 3.5 - 5.1 mmol/L   Calcium, Ion 0.96 (L) 1.15 - 1.40 mmol/L   HCT 48.0 39.0 - 52.0 %   Hemoglobin 16.3 13.0 - 17.0 g/dL   Patient temperature HIDE    Sample type VENOUS    Comment NOTIFIED PHYSICIAN    Dg Chest Port 1 View  Result Date: 08/08/2019 CLINICAL DATA:  Shortness of breath. EXAM: PORTABLE CHEST 1 VIEW COMPARISON:  04/28/2017 FINDINGS: Cardiomegaly, new from prior exam. There is a globular configuration of the cardiopericardial silhouette. Atherosclerosis of the thoracic aorta. No focal airspace disease. No evidence of pulmonary edema. Mild emphysema, apical predominant. No pleural effusion or pneumothorax. No acute osseous abnormalities are seen. IMPRESSION: 1. Cardiomegaly, progressed from 2018. Slightly globular configuration of the cardiopericardial silhouette, which can be seen with underlying pericardial effusion. Consider echocardiogram. 2. Emphysema. Aortic Atherosclerosis (ICD10-I70.0) and Emphysema (ICD10-J43.9). Electronically Signed   By: Keith Rake M.D.   On: 08/08/2019 02:55    Pending Labs Unresulted Labs (From admission, onward)    Start      Ordered   08/09/19 0500  Procalcitonin  Daily,   R     08/08/19 0526   08/08/19 0548  Blood gas, venous  Once,   R     08/08/19 0547   08/08/19 0528  Lactic acid, plasma  Once,   STAT     08/08/19 0527   08/08/19 0528  Culture, blood (routine x 2)  BLOOD CULTURE X 2,   R (with STAT occurrences)     08/08/19 0527   08/08/19 0528  Urinalysis, Routine w reflex microscopic  Once,   STAT     08/08/19 0527   08/08/19 0527  Procalcitonin - Baseline  ONCE - STAT,   STAT     08/08/19 0526   08/08/19 0523  Blood gas, arterial  Once,   R     08/08/19 0522   08/08/19 0241  Blood gas, arterial  Once,   STAT     08/08/19 0240          Vitals/Pain Today's Vitals   08/08/19 0545 08/08/19 0558 08/08/19 0610 08/08/19 0644  BP: (!) 80/58   (!) 82/62  Pulse: 69   71  Resp: 20   20  Temp:      TempSrc:      SpO2: 90%   100%  Weight:      Height:      PainSc: 0-No pain 0-No pain 0-No pain 0-No pain    Isolation Precautions Airborne and Contact  precautions  Medications Medications  0.9 %  sodium chloride infusion (250 mLs Intravenous New Bag/Given 08/08/19 0631)  norepinephrine (LEVOPHED) 26m in 2544mpremix infusion (0 mcg/min Intravenous Hold 08/08/19 0640)  methylPREDNISolone sodium succinate (SOLU-MEDROL) 125 mg/2 mL injection 125 mg (125 mg Intravenous Given 08/08/19 0309)  albuterol (VENTOLIN HFA) 108 (90 Base) MCG/ACT inhaler 6 puff (6 puffs Inhalation Given 08/08/19 0309)  sodium chloride 0.9 % bolus 500 mL (0 mLs Intravenous Stopped 08/08/19 0426)  sodium chloride 0.9 % bolus 1,000 mL (0 mLs Intravenous Stopped 08/08/19 0531)  sodium bicarbonate injection 50 mEq (50 mEq Intravenous Given 08/08/19 0636)    Mobility walks Low fall risk   Focused Assessments Family notified on patient's admission .   R Recommendations: See Admitting Provider Note  Report given to:   Additional Notes:

## 2019-08-09 DIAGNOSIS — I2781 Cor pulmonale (chronic): Secondary | ICD-10-CM

## 2019-08-09 DIAGNOSIS — I5033 Acute on chronic diastolic (congestive) heart failure: Secondary | ICD-10-CM

## 2019-08-09 LAB — PROCALCITONIN: Procalcitonin: 1.19 ng/mL

## 2019-08-09 LAB — BASIC METABOLIC PANEL
Anion gap: 16 — ABNORMAL HIGH (ref 5–15)
BUN: 58 mg/dL — ABNORMAL HIGH (ref 8–23)
CO2: 18 mmol/L — ABNORMAL LOW (ref 22–32)
Calcium: 8.6 mg/dL — ABNORMAL LOW (ref 8.9–10.3)
Chloride: 102 mmol/L (ref 98–111)
Creatinine, Ser: 2.17 mg/dL — ABNORMAL HIGH (ref 0.61–1.24)
GFR calc Af Amer: 34 mL/min — ABNORMAL LOW (ref >60)
GFR calc non Af Amer: 29 mL/min — ABNORMAL LOW (ref >60)
Glucose, Bld: 105 mg/dL — ABNORMAL HIGH (ref 70–99)
Potassium: 5.7 mmol/L — ABNORMAL HIGH (ref 3.5–5.1)
Sodium: 136 mmol/L (ref 135–145)

## 2019-08-09 LAB — CK: Total CK: 90 U/L (ref 49–397)

## 2019-08-09 MED ORDER — DOXYCYCLINE HYCLATE 100 MG PO TABS
100.0000 mg | ORAL_TABLET | Freq: Two times a day (BID) | ORAL | Status: DC
  Administered 2019-08-09 – 2019-08-10 (×3): 100 mg via ORAL
  Filled 2019-08-09 (×3): qty 1

## 2019-08-09 MED ORDER — FUROSEMIDE 40 MG PO TABS: 60.0000 mg | ORAL_TABLET | Freq: Two times a day (BID) | ORAL | Status: DC

## 2019-08-09 MED ORDER — ENOXAPARIN SODIUM 30 MG/0.3ML ~~LOC~~ SOLN: 30.0000 mg | SUBCUTANEOUS | Status: DC

## 2019-08-09 NOTE — Evaluation (Signed)
Physical Therapy Evaluation Patient Details Name: [name] MRN: [id number] DOB: 1945/12/21 Today's Date: 08/09/2019   History of Present Illness  73 y.o. male with PMH of severe pulmonary hypertension on home oxygen, hyperlipidemia, emphysema.  He is brought by EMS for evaluation of shortness of breath and s/p fall. Imaging revealed ND right femoral neck fx. Ortho was consulted and determined pt is extremely high-risk surgical candidate, recommended conservative management.    Clinical Impression  Patient received in bed, agrees to PT/OT session. Anxious about mobility and requires multiple multi-modal cues for mobility. Required mod assist to raise up to sitting position. Cues to scoot forward and for WB status  Prior to standing. Instructed him to extend right leg out in front to reduce WB during transfer. He is able to stand with mod +2 assist from elevated surface and pulling on rolling walker. This was performed 2x, on 2nd trial he was bale to hop a couple of times with mo/max assist as his UEs became fatigued and he needed to sit down. Therefore we guided him safely into the recliner. Patient will benefit from continued skilled PT while here to improve bed mobility, transfers and gait initiation.       Follow Up Recommendations SNF;Supervision/Assistance - 24 hour    Equipment Recommendations  None recommended by PT;Other (comment)(TBD at next venue)    Recommendations for Other Services       Precautions / Restrictions Precautions Precautions: Fall Restrictions Weight Bearing Restrictions: Yes RLE Weight Bearing: Non weight bearing      Mobility  Bed Mobility Overal bed mobility: Needs Assistance Bed Mobility: Supine to Sit     Supine to sit: HOB elevated;Mod assist     General bed mobility comments: required mod assist to raise trunk to seated position  Transfers Overall transfer level: Needs assistance Equipment used: Rolling walker (2 wheeled) Transfers: Sit  to/from Stand Sit to Stand: +2 physical assistance;Mod assist;From elevated surface Stand pivot transfers: +2 physical assistance;Max assist       General transfer comment: Mod A on initial stand to powerup and steady. Cues for sequencing movements and rw technique. Pt preferring to pull up on rw so rw was stabilized by therapists. Pt cued to pivot L foot to advance in that direction but struggled with this and ended up doing more of a hop with LLE. Pt fatigued quickly and in a panicked voice asking if he could sit (prematurely). Assist to guide, steady and control descent of hips into recliner. Pt verbalizing fear of falling during pivot transfer. Able to initially hold RLE in NWB position well but as he fatigued and anxiety increased struggle with this just prior to descending into chair.   Ambulation/Gait Ambulation/Gait assistance: Mod assist Gait Distance (Feet): 2 Feet Assistive device: Rolling walker (2 wheeled) Gait Pattern/deviations: Step-to pattern Gait velocity: decreased   General Gait Details: demonstrates min ability to hop using RW, able to take a couple of steps however B UEs fatigue quickly  Stairs            Wheelchair Mobility    Modified Rankin (Stroke Patients Only)       Balance Overall balance assessment: Needs assistance;History of Falls Sitting-balance support: Feet supported;Bilateral upper extremity supported Sitting balance-Leahy Scale: Fair     Standing balance support: Bilateral upper extremity supported;During functional activity Standing balance-Leahy Scale: Poor Standing balance comment: rw and min steadying assist  Pertinent Vitals/Pain Pain Assessment: Faces Faces Pain Scale: Hurts a little bit Pain Location: R hip/leg Pain Descriptors / Indicators: Grimacing;Discomfort Pain Intervention(s): Monitored during session;Repositioned;Premedicated before session    Home Living Family/patient expects  to be discharged to:: Skilled nursing facility Living Arrangements: Spouse/significant other               Additional Comments: From ILF side of [facility], lives with spouse.     Prior Function Level of Independence: Independent         Comments: reports he walks around his home without difficulty but when out of the home uses wheelchair     Hand Dominance        Extremity/Trunk Assessment   Upper Extremity Assessment Upper Extremity Assessment: Defer to OT evaluation    Lower Extremity Assessment Lower Extremity Assessment: Generalized weakness    Cervical / Trunk Assessment Cervical / Trunk Assessment: Normal  Communication   Communication: No difficulties  Cognition Arousal/Alertness: Awake/alert Behavior During Therapy: WFL for tasks assessed/performed;Anxious Overall Cognitive Status: Within Functional Limits for tasks assessed                                 General Comments: Pt becoming very anxious during stand pivot transfer. Needed a few moments to gather his thoughts once in the recliner.       General Comments      Exercises Total Joint Exercises Ankle Circles/Pumps: AROM;Both;10 reps   Assessment/Plan    PT Assessment Patient needs continued PT services  PT Problem List Decreased strength;Decreased activity tolerance;Decreased balance;Cardiopulmonary status limiting activity;Decreased coordination;Decreased mobility;Decreased safety awareness;Decreased knowledge of precautions;Decreased knowledge of use of DME       PT Treatment Interventions DME instruction;Therapeutic activities;Gait training;Therapeutic exercise;Patient/family education;Balance training;Functional mobility training    PT Goals (Current goals can be found in the Care Plan section)  Acute Rehab PT Goals Patient Stated Goal: d/c to SNF then back to ILF PT Goal Formulation: With patient Time For Goal Achievement: 08/23/19 Potential to Achieve Goals:  Good    Frequency Min 3X/week   Barriers to discharge Decreased caregiver support      Co-evaluation PT/OT/SLP Co-Evaluation/Treatment: Yes Reason for Co-Treatment: For patient/therapist safety;To address functional/ADL transfers PT goals addressed during session: Mobility/safety with mobility;Balance;Proper use of DME OT goals addressed during session: ADL's and self-care;Proper use of Adaptive equipment and DME       AM-PAC PT "6 Clicks" Mobility  Outcome Measure Help needed turning from your back to your side while in a flat bed without using bedrails?: A Little Help needed moving from lying on your back to sitting on the side of a flat bed without using bedrails?: A Little Help needed moving to and from a bed to a chair (including a wheelchair)?: A Lot Help needed standing up from a chair using your arms (e.g., wheelchair or bedside chair)?: A Lot Help needed to walk in hospital room?: Total Help needed climbing 3-5 steps with a railing? : Total 6 Click Score: 12    End of Session Equipment Utilized During Treatment: Gait belt;Oxygen Activity Tolerance: Patient limited by fatigue Patient left: in chair;with chair alarm set;with call bell/phone within reach Nurse Communication: Mobility status PT Visit Diagnosis: Unsteadiness on feet (R26.81);Muscle weakness (generalized) (M62.81);Difficulty in walking, not elsewhere classified (R26.2);History of falling (Z91.81)    Time: 5397-6734 PT Time Calculation (min) (ACUTE ONLY): 58 min   Charges:   PT Evaluation $  PT Eval Moderate Complexity: 1 Mod PT Treatments $Gait Training: 8-22 mins        Shaiann Mcmanamon, PT, GCS 08/09/19,1:55 PM

## 2019-08-09 NOTE — TOC Initial Note (Signed)
Transition of Care [facility]) - Initial/Assessment Note    Patient Details  Name: [name] MRN: [id number] Date of Birth: [birth year]  Transition of Care [employer]) CM/SW Contact:    [name], [place] Phone Number: [phone] 08/09/2019, 3:55 PM  Clinical Narrative:                  CSW met with patient and spouse at bedside regarding SNF recommendation, they are agreeable to patient going back to [facility] but SNF side for short term rehab prior to returning to [facility] apartment.   CSW has initiated [facility] insurance auhtorization.  [facility] admissions reports they are able to accept [name] once auth received.   Expected Discharge Plan: Skilled Nursing Facility Barriers to Discharge: Continued Medical Work up   Patient Goals and CMS Choice Patient states their goals for this hospitalization and ongoing recovery are:: to go to rehab then home CMS Medicare.gov Compare Post Acute Care list provided to:: Patient Choice offered to / list presented to : Patient  Expected Discharge Plan and Services Expected Discharge Plan: [place] Choice: [place] arrangements for the past 2 months: Assisted Living Facility([facility])                                      Prior Living Arrangements/Services Living arrangements for the past 2 months: Assisted Living Facility([facility]) Lives with:: Spouse Patient language and need for interpreter reviewed:: Yes Do you feel safe going back to the place where you live?: Yes      Need for Family Participation in Patient Care: Yes (Comment) Care giver support system in place?: Yes (comment)   Criminal Activity/Legal Involvement Pertinent to Current Situation/Hospitalization: No - Comment as needed  Activities of Daily Living Home Assistive Devices/Equipment: None ADL Screening (condition at time of admission) Patient's cognitive  ability adequate to safely complete daily activities?: Yes Is the patient deaf or have difficulty hearing?: No Does the patient have difficulty seeing, even when wearing glasses/contacts?: No Does the patient have difficulty concentrating, remembering, or making decisions?: No Patient able to express need for assistance with ADLs?: Yes Does the patient have difficulty dressing or bathing?: Yes Independently performs ADLs?: No Communication: Independent Dressing (OT): Needs assistance, Appropriate for developmental age Is this a change from baseline?: Change from baseline, expected to last >3 days Grooming: Appropriate for developmental age, Needs assistance Is this a change from baseline?: Change from baseline, expected to last >3 days Feeding: Independent Bathing: Appropriate for developmental age, Needs assistance Is this a change from baseline?: Change from baseline, expected to last >3 days Toileting: Appropriate for developmental age, Needs assistance Is this a change from baseline?: Change from baseline, expected to last >3days In/Out Bed: Dependent, Appropriate for developmental age Is this a change from baseline?: Change from baseline, expected to last >3 days Walks in Home: Appropriate for developmental age, Dependent Is this a change from baseline?: Change from baseline, expected to last >3 days Does the patient have difficulty walking or climbing stairs?: Yes Weakness of Legs: Both Weakness of Arms/Hands: Both  Permission Sought/Granted Permission sought to share information with : Case Manager, Customer service manager, Family Supports Permission granted to share information with : Yes, Verbal Permission Granted  Share Information with NAME: [name]  Permission granted to share info  w AGENCY: SNFs  Permission granted to share info w Relationship: spouse  Permission granted to share info w Contact Information: [phone]  Emotional Assessment Appearance:: Appears  stated age Attitude/Demeanor/Rapport: Gracious Affect (typically observed): Calm Orientation: : Oriented to Self, Oriented to Place, Oriented to  Time, Oriented to Situation Alcohol / Substance Use: Not Applicable Psych Involvement: No (comment)  Admission diagnosis:  Respiratory distress [R06.03] History of pulmonary hypertension [Z86.79] Closed fracture of neck of right femur, initial encounter ([place]) [S72.001A] Patient Active Problem List   Diagnosis Date Noted  . Cor pulmonale (chronic) ([place]) 08/09/2019  . Acute diastolic CHF (congestive heart failure) ([place]) 08/08/2019  . Diastolic heart failure ([place]) 08/08/2019  . Protein calorie malnutrition ([place]) 04/28/2017  . Thrombocytopenia ([place]) 04/28/2017  . Centrilobular emphysema ([place]) 02/12/2017  . Pulmonary hypertension ([place]) 02/12/2017  . Chronic respiratory failure with hypoxia ([place]) 02/12/2017  . Acute stress disorder 12/07/2016  . Allergic rhinitis due to pollen 12/07/2016  . Angina pectoris ([place]) 12/07/2016  . Anxiety 12/07/2016  . Cardiac arrhythmia 12/07/2016  . Insomnia 12/07/2016  . Low back pain 12/07/2016  . Obesity 12/07/2016  . Raynaud phenomenon 12/07/2016  . Seborrheic dermatitis 12/07/2016  . Umbilical hernia 35/00/9381  . Dyspnea on exertion 09/24/2016  . Essential hypertension, benign 11/06/2013  . CAD (coronary artery disease)   . GAD (generalized anxiety disorder)   . Hyperlipidemia   . Overweight(278.02)    PCP:  Aaron Cruel, MD Pharmacy:   Dwaine Deter, [place] [address] Phone: [phone] Fax: [place], [place] [address] Phone: [phone] Fax: [place], [place] [address] Phone: [phone] Fax: [place],  [place] [address] Phone: [phone] Fax: [phone]     Social Determinants of Health (SDOH) Interventions    Readmission Risk Interventions No flowsheet data found.

## 2019-08-09 NOTE — NC FL2 (Signed)
[place] LEVEL OF CARE SCREENING TOOL     IDENTIFICATION  Patient Name: [name] Birthdate: 12/16/1945 Sex: male Admission Date (Current Location): 08/08/2019  [facility] and [place] Number:  Herbalist and Address:  [facility], [place] [address], [place], [place] [address]      Provider Number: [id number]  Attending Physician Name and Address:  Damita Lack, MD  Relative Name and Phone Number:  Eldridge Abrahams (spouse) [phone]    Current Level of Care: Hospital Recommended Level of Care: [place] Prior Approval Number:    Date Approved/Denied:   PASRR Number: [id number]  Discharge Plan: SNF    Current Diagnoses: Patient Active Problem List   Diagnosis Date Noted  . Cor pulmonale (chronic) ([place]) 08/09/2019  . Acute diastolic CHF (congestive heart failure) ([place]) 08/08/2019  . Diastolic heart failure ([place]) 08/08/2019  . Protein calorie malnutrition ([place]) 04/28/2017  . Thrombocytopenia ([place]) 04/28/2017  . Centrilobular emphysema ([place]) 02/12/2017  . Pulmonary hypertension ([place]) 02/12/2017  . Chronic respiratory failure with hypoxia ([place]) 02/12/2017  . Acute stress disorder 12/07/2016  . Allergic rhinitis due to pollen 12/07/2016  . Angina pectoris ([place]) 12/07/2016  . Anxiety 12/07/2016  . Cardiac arrhythmia 12/07/2016  . Insomnia 12/07/2016  . Low back pain 12/07/2016  . Obesity 12/07/2016  . Raynaud phenomenon 12/07/2016  . Seborrheic dermatitis 12/07/2016  . Umbilical hernia 09/32/6712  . Dyspnea on exertion 09/24/2016  . Essential hypertension, benign 11/06/2013  . CAD (coronary artery disease)   . GAD (generalized anxiety disorder)   . Hyperlipidemia   . Overweight(278.02)     Orientation RESPIRATION BLADDER Height & Weight     Self, Time, Situation, Place  O2(4.5 L/min nasal cannula) Continent Weight: 153 lb 14.1 oz (69.8 kg) Height:  _0  (180.3 cm)  BEHAVIORAL SYMPTOMS/MOOD  NEUROLOGICAL BOWEL NUTRITION STATUS      Continent Diet(see discharge summary)  AMBULATORY STATUS COMMUNICATION OF NEEDS Skin   Extensive Assist Verbally Other (Comment)(cellulities legs, ecchymosis right arm and left hand)                       Personal Care Assistance Level of Assistance  Bathing, Feeding, Dressing, Total care Bathing Assistance: Limited assistance Feeding assistance: Independent Dressing Assistance: Limited assistance Total Care Assistance: Maximum assistance   Functional Limitations Info  Sight, Speech, Hearing Sight Info: Adequate Hearing Info: Adequate Speech Info: Adequate    SPECIAL CARE FACTORS FREQUENCY  PT (By licensed PT), OT (By licensed OT)     PT Frequency: min 5x weekly OT Frequency: min 5x weekly            Contractures Contractures Info: Not present    Additional Factors Info  Code Status, Allergies Code Status Info: DNR Allergies Info: Penicillins, Nitrates, organic, symbicort (budesonide-formoterol fumarate)           Current Medications (08/09/2019):  This is the current hospital active medication list Current Facility-Administered Medications  Medication Dose Route Frequency Provider Last Rate Last Dose  . 0.9 %  sodium chloride infusion  250 mL Intravenous Continuous Omar Person, NP   Stopped at 08/08/19 0700  . 0.9 %  sodium chloride infusion  250 mL Intravenous PRN Norins, Heinz Knuckles, MD      . acetaminophen (TYLENOL) tablet 650 mg  650 mg Oral Q4H PRN Neena Rhymes, MD   650 mg at 08/09/19 1040  . aspirin EC tablet 81 mg  81 mg Oral QPM Norins, Heinz Knuckles, MD   81 mg at 08/08/19 1845  . clonazePAM (KLONOPIN) tablet 0.5 mg  0.5 mg Oral TID PRN Neena Rhymes, MD   0.5 mg at 08/08/19 2259  . doxycycline (VIBRA-TABS) tablet 100 mg  100 mg Oral Q12H Amin, Ankit Chirag, MD      . DULoxetine (CYMBALTA) DR capsule 60 mg  60 mg Oral Daily Norins, Heinz Knuckles, MD   60 mg at 08/09/19 3546  . enoxaparin (LOVENOX)  injection 40 mg  40 mg Subcutaneous Q24H Norins, Heinz Knuckles, MD   40 mg at 08/08/19 1840  . fluticasone furoate-vilanterol (BREO ELLIPTA) 100-25 MCG/INH 1 puff  1 puff Inhalation Daily Candee Furbish, MD   1 puff at 08/09/19 5681  . furosemide (LASIX) tablet 60 mg  60 mg Oral BID Norins, Heinz Knuckles, MD      . ipratropium (ATROVENT) 0.06 % nasal spray 2 spray  2 spray Each Nare QID PRN Neena Rhymes, MD   2 spray at 08/09/19 0815  . lactobacillus acidophilus (BACID) tablet 2 tablet  2 tablet Oral Daily Norins, Heinz Knuckles, MD   2 tablet at 08/09/19 0820  . LORazepam (ATIVAN) tablet 0.5 mg  0.5 mg Oral Daily PRN Norins, Heinz Knuckles, MD      . macitentan (OPSUMIT) tablet 10 mg  10 mg Oral Daily Candee Furbish, MD   10 mg at 08/09/19 2751  . midodrine (PROAMATINE) tablet 5 mg  5 mg Oral TID WC Candee Furbish, MD   5 mg at 08/09/19 1148  . mirtazapine (REMERON) tablet 15 mg  15 mg Oral QHS Norins, Heinz Knuckles, MD   15 mg at 08/08/19 2023  . multivitamin with minerals tablet 1 tablet  1 tablet Oral Daily Norins, Heinz Knuckles, MD   1 tablet at 08/09/19 [id number]  . ondansetron (ZOFRAN) injection 4 mg  4 mg Intravenous Q6H PRN Norins, Heinz Knuckles, MD      . rosuvastatin (CRESTOR) tablet 40 mg  40 mg Oral Daily Norins, Heinz Knuckles, MD   40 mg at 08/09/19 0820  . Selexipag TABS 400 mcg  400 mcg Oral q morning - 10a Candee Furbish, MD   400 mcg at 08/09/19 0825  . Selexipag TABS 600 mcg  600 mcg Oral QHS Tegeler, Gwenyth Allegra, MD      . sodium chloride flush (NS) 0.9 % injection 3 mL  3 mL Intravenous Q12H Norins, Heinz Knuckles, MD   3 mL at 08/09/19 0824  . sodium chloride flush (NS) 0.9 % injection 3 mL  3 mL Intravenous PRN Norins, Heinz Knuckles, MD      . tadalafil (CIALIS) tablet 40 mg  40 mg Oral Daily Candee Furbish, MD   40 mg at 08/09/19 7494  . umeclidinium bromide (INCRUSE ELLIPTA) 62.5 MCG/INH 1 puff  1 puff Inhalation Daily Candee Furbish, MD   Stopped at 08/09/19 [phone]     Discharge Medications: Please see  discharge summary for a list of discharge medications.  Relevant Imaging Results:  Relevant Lab Results:   Additional Information SSN: [id number]  Alberteen Sam, LCSW

## 2019-08-09 NOTE — Progress Notes (Signed)
PROGRESS NOTE    [name]  ZDG:[id number] DOB: [birth year] DOA: 08/08/2019 PCP: Lawerance Cruel, MD   Brief Narrative:  73 year old with history of severe pulmonary hypertension on home oxygen, HLD, emphysema brought to the hospital for worsening shortness of breath.  Tripped and fell at home causing right hip pain.  X-ray in the hospital showed subtle right subcapital femur fracture.  Orthopedic consulted who recommended conservative management.  Pulmonary consulted but recommended no intubation at this point.   Assessment & Plan:   Active Problems:   GAD (generalized anxiety disorder)   Essential hypertension, benign   Pulmonary hypertension (HCC)   Chronic respiratory failure with hypoxia (HCC)   Acute diastolic CHF (congestive heart failure) (HCC)   Diastolic heart failure (HCC)  Acute on chronic hypoxic respiratory distress, 3 L at rest Severe pulmonary hypertension, end-stage with cor pulmonale, group 1 and 3 Acute on chronic diastolic congestive heart failure History of COPD/emphysema -Supplemental oxygen.  Bronchodilators.  Incentive spirometer and flutter valve.  Supportive care. -IV Lasix twice daily monitor urine output. -Sildenafil, macitentan. -Bronchodilators. -Echocardiogram EF 60 to 65%, grade 2 DD, severe TR, severely reduced right ventricular function dilated cardiomyopathy, elevated right ventricular systolic pressures, trivial pericardial effusion -Aspirin.  Home bronchodilators -Pulmonary following -Procalcitonin mildly elevated.  We will place him on doxycycline 5 days  Mechanical fall causing nondisplaced right subcapital femur fracture -PT/OT-nonweightbearing in the right lower extremity -Orthopedic-no surgical intervention at this point.  Follow-up with Dr. Stann Mainland in 2 weeks for repeat x-ray  Acute kidney injury -Admission creatinine 2.1.  Baseline creatinine 1.1. -Check CK levels. -Unable to hydrate due to lactic acidosis and concerns for  volume overload.  Hyperkalemia -Stop potassium supplements and Aldactone.  Monitor renal function and electrolytes  Essential hypertension -Due to borderline low blood pressure, he is on Midrin  Depression/anxiety -On Cymbalta, mirtazapine  DVT prophylaxis: Lovenox Code Status: DNR Family Communication: None at bedside Disposition Plan: Maintain hospital stay until his hypoxia is better.  Still feeling little dyspneic with minimal exertion.  In the meantime awaiting renal function and potassium levels to improve  Consultants:   Pulmonary  Procedures:   None  Antimicrobials:   Doxycycline day 1   Subjective: Still having exertional shortness of breath and nonproductive cough.  Tells me his nose and ears are chronically cyanotic.  At baseline uses 3 L nasal cannula and can go up to as high as 6+ liters nasal cannula with exertion and therapy.  Review of Systems Otherwise negative except as per HPI, including: General: Denies fever, chills, night sweats or unintended weight loss. Resp: Denies cough, wheezing Cardiac: Denies chest pain, palpitations, orthopnea, paroxysmal nocturnal dyspnea. GI: Denies abdominal pain, nausea, vomiting, diarrhea or constipation GU: Denies dysuria, frequency, hesitancy or incontinence MS: Denies muscle aches, joint pain or swelling Neuro: Denies headache, neurologic deficits (focal weakness, numbness, tingling), abnormal gait Psych: Denies anxiety, depression, SI/HI/AVH Skin: Denies new rashes or lesions ID: Denies sick contacts, exotic exposures, travel  Objective: Vitals:   08/08/19 1906 08/09/19 0024 08/09/19 0406 08/09/19 0750  BP: _0 92/63  Pulse: 73 65 72 65  Resp: (!) _1 Temp: 98.1 F (36.7 C) 98.2 F (36.8 C) 98 F (36.7 C) 97.6 F (36.4 C)  TempSrc: Oral Oral Oral Oral  SpO2:  100% 100% (!) 75%  Weight: 69.9 kg  69.8 kg   Height: _2  (1.803 m)       Intake/Output Summary (Last 24 hours) at  08/09/2019 0812 Last data filed at 08/09/2019 0750 Gross per 24 hour  Intake --  Output 525 ml  Net -525 ml   Filed Weights   08/08/19 0238 08/08/19 1906 08/09/19 0406  Weight: 75 kg 69.9 kg 69.8 kg    Examination:  General exam: Appears calm and comfortable, 3 L nasal cannula Respiratory system: Diffuse diminished breath sounds Cardiovascular system: S1 & S2 heard, RRR. No JVD, murmurs, rubs, gallops or clicks. No pedal edema. Gastrointestinal system: Abdomen is nondistended, soft and nontender. No organomegaly or masses felt. Normal bowel sounds heard. Central nervous system: Alert and oriented. No focal neurological deficits. Extremities: Symmetric 5 x 5 power. Skin: Cyanotic Nose and Ear tips. Psychiatry: Judgement and insight appear normal. Mood & affect appropriate.     Data Reviewed:   CBC: Recent Labs  Lab 08/08/19 0245 08/08/19 0609  WBC 7.7  --   NEUTROABS 6.3  --   HGB 16.3 16.3  HCT 50.5 48.0  MCV 95.8  --   PLT 102*  --    Basic Metabolic Panel: Recent Labs  Lab 08/08/19 0245 08/08/19 0609 08/09/19 0319  NA 136 135 136  K 5.6* 5.4* 5.7*  CL 101  --  102  CO2 18*  --  18*  GLUCOSE 157*  --  105*  BUN 47*  --  58*  CREATININE 2.09*  --  2.17*  CALCIUM 8.6*  --  8.6*   GFR: Estimated Creatinine Clearance: 29.9 mL/min (A) (by C-G formula based on SCr of 2.17 mg/dL (H)). Liver Function Tests: Recent Labs  Lab 08/08/19 0245  AST 64*  ALT 32  ALKPHOS 144*  BILITOT 2.2*  PROT 6.7  ALBUMIN 3.4*   No results for input(s): LIPASE, AMYLASE in the last 168 hours. No results for input(s): AMMONIA in the last 168 hours. Coagulation Profile: No results for input(s): INR, PROTIME in the last 168 hours. Cardiac Enzymes: No results for input(s): CKTOTAL, CKMB, CKMBINDEX, TROPONINI in the last 168 hours. BNP (last 3 results) Recent Labs    08/19/18 0936  PROBNP 659.0*   HbA1C: No results for input(s): HGBA1C in the last 72 hours. CBG: No  results for input(s): GLUCAP in the last 168 hours. Lipid Profile: No results for input(s): CHOL, HDL, LDLCALC, TRIG, CHOLHDL, LDLDIRECT in the last 72 hours. Thyroid Function Tests: No results for input(s): TSH, T4TOTAL, FREET4, T3FREE, THYROIDAB in the last 72 hours. Anemia Panel: No results for input(s): VITAMINB12, FOLATE, FERRITIN, TIBC, IRON, RETICCTPCT in the last 72 hours. Sepsis Labs: Recent Labs  Lab 08/08/19 0245 08/08/19 0600 08/09/19 0319  PROCALCITON  --  0.23 1.19  LATICACIDVEN 5.4* 3.2*  --     Recent Results (from the past 240 hour(s))  SARS CORONAVIRUS 2 (TAT 6-24 HRS) Nasopharyngeal Nasopharyngeal Swab     Status: None   Collection Time: 08/08/19  4:30 AM   Specimen: Nasopharyngeal Swab  Result Value Ref Range Status   SARS Coronavirus 2 NEGATIVE NEGATIVE Final    Comment: (NOTE) SARS-CoV-2 target nucleic acids are NOT DETECTED. The SARS-CoV-2 RNA is generally detectable in upper and lower respiratory specimens during the acute phase of infection. Negative results do not preclude SARS-CoV-2 infection, do not rule out co-infections with other pathogens, and should not be used as the sole basis for treatment or other patient management decisions. Negative results must be combined with clinical observations, patient history, and epidemiological information. The expected result is Negative. Fact Sheet for Patients: SugarRoll.be Fact Sheet for Healthcare  Providers: https://www.woods-mathews.com/ This test is not yet approved or cleared by the [place] and  has been authorized for detection and/or diagnosis of SARS-CoV-2 by FDA under an Emergency Use Authorization (EUA). This EUA will remain  in effect (meaning this test can be used) for the duration of the COVID-19 declaration under Section 56 4(b)(1) of the Act, 21 U.S.C. section 360bbb-3(b)(1), unless the authorization is terminated or revoked  sooner. Performed at [facility], [place] [address] [place], [place] [address]   Respiratory Panel by RT PCR (Flu A&B, Covid) - Nasopharyngeal Swab     Status: None   Collection Time: 08/08/19  5:26 AM   Specimen: Nasopharyngeal Swab  Result Value Ref Range Status   SARS Coronavirus 2 by RT PCR NEGATIVE NEGATIVE Final    Comment: (NOTE) SARS-CoV-2 target nucleic acids are NOT DETECTED. The SARS-CoV-2 RNA is generally detectable in upper respiratoy specimens during the acute phase of infection. The lowest concentration of SARS-CoV-2 viral copies this assay can detect is 131 copies/mL. A negative result does not preclude SARS-Cov-2 infection and should not be used as the sole basis for treatment or other patient management decisions. A negative result may occur with  improper specimen collection/handling, submission of specimen other than nasopharyngeal swab, presence of viral mutation(s) within the areas targeted by this assay, and inadequate number of viral copies (<131 copies/mL). A negative result must be combined with clinical observations, patient history, and epidemiological information. The expected result is Negative. Fact Sheet for Patients:  PinkCheek.be Fact Sheet for Healthcare Providers:  GravelBags.it This test is not yet ap proved or cleared by the [place] FDA and  has been authorized for detection and/or diagnosis of SARS-CoV-2 by FDA under an Emergency Use Authorization (EUA). This EUA will remain  in effect (meaning this test can be used) for the duration of the COVID-19 declaration under Section 564(b)(1) of the Act, 21 U.S.C. section 360bbb-3(b)(1), unless the authorization is terminated or revoked sooner.    Influenza A by PCR NEGATIVE NEGATIVE Final   Influenza B by PCR NEGATIVE NEGATIVE Final    Comment: (NOTE) The Xpert Xpress SARS-CoV-2/FLU/RSV assay is intended as an aid in  the  diagnosis of influenza from Nasopharyngeal swab specimens and  should not be used as a sole basis for treatment. Nasal washings and  aspirates are unacceptable for Xpert Xpress SARS-CoV-2/FLU/RSV  testing. Fact Sheet for Patients: PinkCheek.be Fact Sheet for Healthcare Providers: GravelBags.it This test is not yet approved or cleared by the [place] FDA and  has been authorized for detection and/or diagnosis of SARS-CoV-2 by  FDA under an Emergency Use Authorization (EUA). This EUA will remain  in effect (meaning this test can be used) for the duration of the  Covid-19 declaration under Section 564(b)(1) of the Act, 21  U.S.C. section 360bbb-3(b)(1), unless the authorization is  terminated or revoked. Performed at [facility], [place] [address], [place], [place] [address]   Culture, blood (routine x 2)     Status: None (Preliminary result)   Collection Time: 08/08/19  5:55 AM   Specimen: BLOOD RIGHT WRIST  Result Value Ref Range Status   Specimen Description BLOOD RIGHT WRIST  Final   Special Requests   Final    BOTTLES DRAWN AEROBIC AND ANAEROBIC Blood Culture adequate volume   Culture   Final    NO GROWTH < 12 HOURS Performed at [facility], [place] [address] [place], [place] [address]  Report Status PENDING  Incomplete  Culture, blood (routine x 2)     Status: None (Preliminary result)   Collection Time: 08/08/19  6:08 AM   Specimen: BLOOD  Result Value Ref Range Status   Specimen Description BLOOD RIGHT ANTECUBITAL  Final   Special Requests   Final    BOTTLES DRAWN AEROBIC AND ANAEROBIC Blood Culture adequate volume   Culture   Final    NO GROWTH < 12 HOURS Performed at [facility], [address] [place], [place] [address]    Report Status PENDING  Incomplete         Radiology Studies: Ct Hip Right Wo Contrast  Result Date: 08/08/2019 CLINICAL DATA:  Right hip pain secondary to a  fall. EXAM: CT OF THE RIGHT HIP WITHOUT CONTRAST TECHNIQUE: Multidetector CT imaging of the right hip was performed according to the standard protocol. Multiplanar CT image reconstructions were also generated. COMPARISON:  Radiographs dated 08/08/2019 FINDINGS: Bones/Joint/Cartilage There is a very subtle subcapital fracture of the right femoral neck with no displacement. The visualized pelvic bones are intact. Muscles and Tendons Normal. Soft tissues Extensive sigmoid diverticulosis. Slight edema in the subcutaneous fat of the buttocks and proximal right femur, indeterminate. IMPRESSION: Subtle subcapital fracture of the right femoral neck. Electronically Signed   By: Lorriane Shire M.D.   On: 08/08/2019 10:01   Dg Chest Port 1 View  Result Date: 08/08/2019 CLINICAL DATA:  Shortness of breath. EXAM: PORTABLE CHEST 1 VIEW COMPARISON:  04/28/2017 FINDINGS: Cardiomegaly, new from prior exam. There is a globular configuration of the cardiopericardial silhouette. Atherosclerosis of the thoracic aorta. No focal airspace disease. No evidence of pulmonary edema. Mild emphysema, apical predominant. No pleural effusion or pneumothorax. No acute osseous abnormalities are seen. IMPRESSION: 1. Cardiomegaly, progressed from 2018. Slightly globular configuration of the cardiopericardial silhouette, which can be seen with underlying pericardial effusion. Consider echocardiogram. 2. Emphysema. Aortic Atherosclerosis (ICD10-I70.0) and Emphysema (ICD10-J43.9). Electronically Signed   By: Keith Rake M.D.   On: 08/08/2019 02:55   Dg Hip Unilat W Or Wo Pelvis 2-3 Views Right  Result Date: 08/08/2019 CLINICAL DATA:  Fall with right hip pain EXAM: DG HIP (WITH OR WITHOUT PELVIS) 2-3V RIGHT COMPARISON:  None. FINDINGS: Limited assessment of the right femoral neck due to inward rotation. No definite fracture, although there is subtle cortical overlapping at the lateral femoral neck. No dislocation. Osteopenia. IMPRESSION:  Subtle cortical irregularity at the lateral femoral neck, not definite for fracture. Depending on suspicion for fracture a CT or MRI could be obtained. Electronically Signed   By: Monte Fantasia M.D.   On: 08/08/2019 07:57   Vas [place] Lower Extremity Venous (dvt)  Result Date: 08/08/2019  Lower Venous Study Indications: Edema.  Comparison Study: no prior Performing Technologist: Abram Sander RVS  Examination Guidelines: A complete evaluation includes B-mode imaging, spectral Doppler, color Doppler, and power Doppler as needed of all accessible portions of each vessel. Bilateral testing is considered an integral part of a complete examination. Limited examinations for reoccurring indications may be performed as noted.  +---------+---------------+---------+-----------+----------+--------------+  RIGHT     Compressibility Phasicity Spontaneity Properties Thrombus Aging  +---------+---------------+---------+-----------+----------+--------------+  CFV       Full            Yes       Yes                                    +---------+---------------+---------+-----------+----------+--------------+  SFJ       Full                                                             +---------+---------------+---------+-----------+----------+--------------+  FV Prox   Full                                                             +---------+---------------+---------+-----------+----------+--------------+  FV Mid    Full                                                             +---------+---------------+---------+-----------+----------+--------------+  FV Distal Full                                                             +---------+---------------+---------+-----------+----------+--------------+  PFV       Full                                                             +---------+---------------+---------+-----------+----------+--------------+  POP       Full            Yes       Yes                                     +---------+---------------+---------+-----------+----------+--------------+  PTV       Full                                                             +---------+---------------+---------+-----------+----------+--------------+  PERO                                                       Not visualized  +---------+---------------+---------+-----------+----------+--------------+   +---------+---------------+---------+-----------+----------+--------------+  LEFT      Compressibility Phasicity Spontaneity Properties Thrombus Aging  +---------+---------------+---------+-----------+----------+--------------+  CFV       Full            Yes       Yes                                    +---------+---------------+---------+-----------+----------+--------------+  SFJ       Full                                                             +---------+---------------+---------+-----------+----------+--------------+  FV Prox   Full                                                             +---------+---------------+---------+-----------+----------+--------------+  FV Mid    Full                                                             +---------+---------------+---------+-----------+----------+--------------+  FV Distal Full                                                             +---------+---------------+---------+-----------+----------+--------------+  PFV       Full                                                             +---------+---------------+---------+-----------+----------+--------------+  POP       Full            Yes       Yes                                    +---------+---------------+---------+-----------+----------+--------------+  PTV       Full                                                             +---------+---------------+---------+-----------+----------+--------------+  PERO                                                       Not visualized   +---------+---------------+---------+-----------+----------+--------------+     Summary: Right: There is no evidence of deep vein thrombosis in the lower extremity. No cystic structure found in the popliteal fossa. Left: There is no evidence of deep vein thrombosis in the lower extremity. No cystic structure found in the popliteal fossa.  *See table(s) above for measurements and observations. Electronically signed by Harold Barban MD on 08/08/2019 at 5:28:13 PM.  Final         Scheduled Meds:  aspirin EC  81 mg Oral QPM   DULoxetine  60 mg Oral Daily   enoxaparin (LOVENOX) injection  40 mg Subcutaneous Q24H   fluticasone furoate-vilanterol  1 puff Inhalation Daily   furosemide  40 mg Intravenous Q12H   furosemide  60 mg Oral BID   lactobacillus acidophilus  2 tablet Oral Daily   macitentan  10 mg Oral Daily   midodrine  5 mg Oral TID WC   mirtazapine  15 mg Oral QHS   multivitamin with minerals  1 tablet Oral Daily   potassium chloride  30 mEq Oral Q1500   rosuvastatin  40 mg Oral Daily   Selexipag  400 mcg Oral q morning - 10a   Selexipag  600 mcg Oral QHS   sodium chloride flush  3 mL Intravenous Q12H   spironolactone  25 mg Oral Daily   tadalafil  40 mg Oral Daily   umeclidinium bromide  1 puff Inhalation Daily   Continuous Infusions:  sodium chloride Stopped (08/08/19 0700)   sodium chloride     norepinephrine (LEVOPHED) Adult infusion Stopped (08/08/19 0640)     LOS: 1 day   Time spent= 35 mins    Arohi Salvatierra Arsenio Loader, MD [facility]  If 7PM-7AM, please contact night-coverage  08/09/2019, 8:12 AM

## 2019-08-09 NOTE — Progress Notes (Signed)
NAME:  [name], MRN:  [id number], DOB:  1945/09/26, LOS: 1 ADMISSION DATE:  08/08/2019, CONSULTATION DATE:  08/08/19 REFERRING MD:  ER, CHIEF COMPLAINT:  Hip pain   Brief History   73 year old man with advanced PAH presenting after fall with hip pain.  Has groups 1/3 disease on triple home oral therapy.  PFTs 2018 showing preserved FEV1, FVC, but markedly reduced DLCO.  CT with severe emphysema.  Past Medical History  Cor pulmonale and end stage PAH,Anxiety/depression,COPD Pulmonary cachexia  Significant Hospital Events   08/08/19  Consults:  ?Ortho-->planning for non-operative treatment   Procedures:  N/A  Significant Diagnostic Tests:  Hip x-ray >>non-displaced right subscapular fracture of femur  Lactate 5>>3  Micro Data:  COVID neg  Antimicrobials:  None   Interim history/subjective:  Pain well controlled. Feels at baseline from breathing stand-point.  Objective   Blood pressure (Abnormal) 89/57, pulse 67, temperature (Abnormal) 97.3 F (36.3 C), temperature source Oral, resp. rate 15, height 5' 11" (1.803 m), weight 69.8 kg, SpO2 100 %.        Intake/Output Summary (Last 24 hours) at 08/09/2019 1147 Last data filed at 08/09/2019 1024 Gross per 24 hour  Intake no documentation  Output 725 ml  Net -725 ml  oxygen 4 to 4.5 liters  Filed Weights   08/08/19 0238 08/08/19 1906 08/09/19 0406  Weight: 75 kg 69.9 kg 69.8 kg    Examination: General chronically ill-appearing white male he is resting up in the chair currently with no acute distress HEENT normocephalic atraumatic no jugular venous distention appreciated his mucous membranes are moist Pulmonary: Clear to auscultation diminished bases no accessory use on 4 L nasal cannula of note his baseline is 3 Cardiac: Regular rate and rhythm Abdomen: Soft not tender no organomegaly Extremities are warm and dry he has chronic venous stasis changes with acute on chronic lower extremity edema, he reports his  edema is slightly worse than baseline Neuro awake oriented no focal deficits  Resolved Hospital Problem list   N/A  Assessment & Plan:  #non-displaced right hip fracture  # 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 Little to add here. Already DNR. Volume status improved.    Plan Cont PAH meds: getting cialis in place of sildenafil Cont home macitentan and uptravi Cont lasix as BP/BUN allow Breo in [place] of trelegy  Cont low dose midodrine  Cont oxygen  Consider palliative consult  He would like to have a virtual visit as a follow-up, the plan was for him to see Dr. Vaughan Browner.  Our office will help arrange this   Erick Colace ACNP-BC [place] Pager # [phone] OR # [phone] if no answer

## 2019-08-09 NOTE — Progress Notes (Signed)
Pt has low BP 85/54. Notified MD. MD advised to hole cardiac meds and lasix, continue monitor pt, if Bp drop lower, call MD for IVF.

## 2019-08-09 NOTE — Evaluation (Signed)
Occupational Therapy Evaluation Patient Details Name: [name] MRN: [id number] DOB: 07/11/1946 Today's Date: 08/09/2019    History of Present Illness 73 y.o. male with PMH of severe pulmonary hypertension on home oxygen, hyperlipidemia, emphysema.  He is brought by EMS for evaluation of shortness of breath and s/p fall. Imaging revealed ND right femoral neck fx. Ortho was consulted and determined pt is extremely high-risk surgical candidate, recommended conservative management.   Clinical Impression   Pt admitted with the above diagnoses and presents with below problem list. Pt will benefit from continued acute OT to address the below listed deficits and maximize independence with basic ADLs prior to d/c to venue below. At baseline, pt uses walker within his ILF unit, power chair for distances outside that space, mod I with basic ADLs. Pt lives with spouse. Pt currently mod-max +2 physical assist with LB ADLs and functional transfers, unable to progress mobility this session. Pt completed SPT to left side to sit up in chair. Became lightheaded on initial stand. BP assessed as follows: sitting EOB: 82/53, standing 77/59, 84/65 once in recliner. Pt initially able to keep RLE in NWB position well but did fatigue suddenly during pivot to recliner, became a bit panicked and verbalized fear of falling: max A to guide hips, steady and control descent into recliner. Struggled with endurance to keep RLE in NWB position to pivot to recliner. Pt left in recliner with all needs met.       Follow Up Recommendations  SNF    Equipment Recommendations  Other (comment)(defer to next venue)    Recommendations for Other Services       Precautions / Restrictions Precautions Precautions: Fall Restrictions Weight Bearing Restrictions: Yes RLE Weight Bearing: Non weight bearing      Mobility Bed Mobility Overal bed mobility: Needs Assistance Bed Mobility: Supine to Sit     Supine to sit: Mod  assist;HOB elevated     General bed mobility comments: assist to powerup trunk, able to advance BLE across bed with no pysical assist. pt used bed rail to facilitate powerup.  Transfers Overall transfer level: Needs assistance Equipment used: Rolling walker (2 wheeled) Transfers: Sit to/from [facility] Sit to Stand: Mod assist;From elevated surface;+2 physical assistance;Max assist Stand pivot transfers: Max assist;From elevated surface       General transfer comment: Mod A on initial stand to powerup and steady. Cues for sequencing movements and rw technique. Pt preferring to pull up on rw so rw was stabilized by therapists. Pt cued to pivot L foot to advance in that direction but struggled with this and ended up doing more of a hop with LLE. Pt fatigued quickly and in a panicked voice asking if he could sit (prematurely). Assist to guide, steady and control descent of hips into recliner. Pt verbalizing fear of falling during pivot transfer. Able to initially hold RLE in NWB position well but as he fatigued and anxiety increased struggle with this just prior to descending into chair.     Balance Overall balance assessment: Needs assistance;History of Falls Sitting-balance support: Bilateral upper extremity supported;Feet supported Sitting balance-Leahy Scale: Fair     Standing balance support: Bilateral upper extremity supported Standing balance-Leahy Scale: Poor Standing balance comment: rw and min steadying assist                           ADL either performed or assessed with clinical judgement   ADL Overall ADL's :  Needs assistance/impaired Eating/Feeding: Set up;Sitting   Grooming: Set up;Sitting   Upper Body Bathing: Set up;Sitting   Lower Body Bathing: Maximal assistance;+2 for physical assistance;Sit to/from stand   Upper Body Dressing : Set up;Sitting   Lower Body Dressing: Maximal assistance;+2 for physical assistance;Sit to/from stand    Toilet Transfer: Maximal assistance;+2 for physical assistance;Stand-pivot;BSC;RW;Cueing for sequencing;Cueing for safety Toilet Transfer Details (indicate cue type and reason): simulated with EOB to recliner on left side Toileting- Clothing Manipulation and Hygiene: Moderate assistance;Sit to/from stand         General ADL Comments: Pt completed bed mobility, 2x sit<>stands. On second stand pt pivoted to recliner towards left side. Cueing for sequencing of movements. Pt becoming a bit overwhelmed and fearful of falling "my arms are giving out. I'm going to fall!" Assist to guide hips, steady and control descent into chair. Fatigues quickly.      Vision         Perception     Praxis      Pertinent Vitals/Pain Pain Assessment: Faces Faces Pain Scale: Hurts little more Pain Location: R hip/leg Pain Descriptors / Indicators: Grimacing Pain Intervention(s): Repositioned;Monitored during session;Limited activity within patient's tolerance     Hand Dominance     Extremity/Trunk Assessment Upper Extremity Assessment Upper Extremity Assessment: Generalized weakness   Lower Extremity Assessment Lower Extremity Assessment: Defer to PT evaluation       Communication Communication Communication: No difficulties   Cognition Arousal/Alertness: Awake/alert Behavior During Therapy: Anxious;WFL for tasks assessed/performed Overall Cognitive Status: Within Functional Limits for tasks assessed                                 General Comments: Pt becoming very anxious during stand pivot transfer. Needed a few moments to gather his thoughts once in the recliner.    General Comments       Exercises     Shoulder Instructions      Home Living Family/patient expects to be discharged to:: Skilled nursing facility Living Arrangements: Spouse/significant other                               Additional Comments: From ILF side of [place], lives with  spouse.       Prior Functioning/Environment Level of Independence: Independent with assistive device(s)        Comments: rw for in room mobility, power chair for out of the home distances.         OT Problem List: Decreased activity tolerance;Decreased strength;Impaired balance (sitting and/or standing);Decreased knowledge of use of DME or AE;Decreased knowledge of precautions;Pain      OT Treatment/Interventions: Self-care/ADL training;Therapeutic exercise;Energy conservation;DME and/or AE instruction;Therapeutic activities;Patient/family education;Balance training    OT Goals(Current goals can be found in the care plan section) Acute Rehab OT Goals Patient Stated Goal: d/c to SNF then back to ILF OT Goal Formulation: With patient Time For Goal Achievement: 08/23/19 Potential to Achieve Goals: Good ADL Goals Pt Will Perform Lower Body Bathing: with min assist;sit to/from stand;sitting/lateral leans Pt Will Perform Lower Body Dressing: with min assist;sit to/from stand;sitting/lateral leans Pt Will Transfer to Toilet: with min assist;stand pivot transfer Pt Will Perform Toileting - Clothing Manipulation and hygiene: with min assist;sit to/from stand;sitting/lateral leans Additional ADL Goal #1: Pt will complete bed mobility at min guard level to prepare for EOB/OOB ADLs.  OT Frequency:  Min 2X/week   Barriers to D/C:            Co-evaluation PT/OT/SLP Co-Evaluation/Treatment: Yes Reason for Co-Treatment: For patient/therapist safety;To address functional/ADL transfers   OT goals addressed during session: ADL's and self-care;Proper use of Adaptive equipment and DME      AM-PAC OT "6 Clicks" Daily Activity     Outcome Measure Help from another person eating meals?: None Help from another person taking care of personal grooming?: None Help from another person toileting, which includes using toliet, bedpan, or urinal?: Total Help from another person bathing (including  washing, rinsing, drying)?: A Lot Help from another person to put on and taking off regular upper body clothing?: A Little Help from another person to put on and taking off regular lower body clothing?: Total 6 Click Score: 15   End of Session Equipment Utilized During Treatment: Gait belt;Rolling walker;Oxygen(4L/min) Nurse Communication: Mobility status;Weight bearing status  Activity Tolerance: Patient limited by fatigue;Other (comment);Patient tolerated treatment well(acute anxiety) Patient left: in chair;with call bell/phone within reach;with chair alarm set  OT Visit Diagnosis: Other abnormalities of gait and mobility (R26.89);Unsteadiness on feet (R26.81);Muscle weakness (generalized) (M62.81);History of falling (Z91.81);Pain Pain - Right/Left: Right Pain - part of body: Hip;Leg                Time: 9295-7473 OT Time Calculation (min): 50 min Charges:  OT General Charges $OT Visit: 1 Visit OT Evaluation $OT Eval Moderate Complexity: [place], OT Acute Rehabilitation Services Pager: [phone] Office: [phone]   Hortencia Pilar 08/09/2019, 12:32 PM

## 2019-08-10 ENCOUNTER — Inpatient Hospital Stay (HOSPITAL_COMMUNITY): Payer: Medicare Other

## 2019-08-10 DIAGNOSIS — Z7189 Other specified counseling: Secondary | ICD-10-CM

## 2019-08-10 LAB — BASIC METABOLIC PANEL
Anion gap: 14 (ref 5–15)
BUN: 74 mg/dL — ABNORMAL HIGH (ref 8–23)
CO2: 21 mmol/L — ABNORMAL LOW (ref 22–32)
Calcium: 8.4 mg/dL — ABNORMAL LOW (ref 8.9–10.3)
Chloride: 101 mmol/L (ref 98–111)
Creatinine, Ser: 2.51 mg/dL — ABNORMAL HIGH (ref 0.61–1.24)
GFR calc Af Amer: 28 mL/min — ABNORMAL LOW (ref >60)
GFR calc non Af Amer: 24 mL/min — ABNORMAL LOW (ref >60)
Glucose, Bld: 97 mg/dL (ref 70–99)
Potassium: 5.4 mmol/L — ABNORMAL HIGH (ref 3.5–5.1)
Sodium: 136 mmol/L (ref 135–145)

## 2019-08-10 LAB — PROCALCITONIN: Procalcitonin: 1.14 ng/mL

## 2019-08-10 LAB — BRAIN NATRIURETIC PEPTIDE: B Natriuretic Peptide: 1631.7 pg/mL — ABNORMAL HIGH (ref 0.0–100.0)

## 2019-08-10 MED ORDER — FUROSEMIDE 10 MG/ML IJ SOLN
20.0000 mg | Freq: Once | INTRAMUSCULAR | Status: AC
  Administered 2019-08-10: 20 mg via INTRAVENOUS
  Filled 2019-08-10: qty 2

## 2019-08-10 MED ORDER — MIDODRINE HCL 5 MG PO TABS: 5.0000 mg | ORAL_TABLET | Freq: Three times a day (TID) | ORAL | 1 refills | Status: DC

## 2019-08-10 MED ORDER — CLONAZEPAM 0.5 MG PO TABS: 0.5000 mg | ORAL_TABLET | Freq: Three times a day (TID) | ORAL | 0 refills | Status: AC | PRN

## 2019-08-10 MED ORDER — DOXYCYCLINE HYCLATE 100 MG PO TABS: 100.0000 mg | ORAL_TABLET | Freq: Two times a day (BID) | ORAL | 0 refills | Status: AC

## 2019-08-10 MED ORDER — LORAZEPAM 0.5 MG PO TABS: 0.5000 mg | ORAL_TABLET | Freq: Every day | ORAL | 0 refills | Status: DC | PRN

## 2019-08-10 MED ORDER — FUROSEMIDE 40 MG PO TABS: 60.0000 mg | ORAL_TABLET | Freq: Two times a day (BID) | ORAL | Status: DC

## 2019-08-10 NOTE — Plan of Care (Signed)

## 2019-08-10 NOTE — TOC Transition Note (Signed)
Transition of Care [facility]) - CM/SW Discharge Note   Patient Details  Name: [name] MRN: [id number] Date of Birth: Dec 20, 1945  Transition of Care [facility]) CM/SW Contact:  Alberteen Sam, LCSW Phone Number: 08/10/2019, 1:14 PM   Clinical Narrative:     Patient will DC to: [place] Anticipated DC date: 08/10/2019 Family notified:Colette Transport RJ:GYLU  Per MD patient ready for DC to [place], patient, patient's family, and facility notified of DC. Discharge Summary sent to facility. RN given number for report   [phone] . DC packet on chart. Ambulance transport requested for patient.  CSW signing off.  [place], [place]   Final next level of care: Skilled Nursing Facility Barriers to Discharge: No Barriers Identified   Patient Goals and CMS Choice Patient states their goals for this hospitalization and ongoing recovery are:: to go to rehab then home CMS Medicare.gov Compare Post Acute Care list provided to:: Patient Choice offered to / list presented to : Patient  Discharge Placement PASRR number recieved: 08/09/19            Patient [facility] bed at: [facility] Patient to be transferred to facility by: [place] Name of family member notified: Colette Patient and family notified of of transfer: 08/10/19  Discharge Plan and Services     Post Acute Care Choice: [place]                               Social Determinants of Health (SDOH) Interventions     Readmission Risk Interventions No flowsheet data found.

## 2019-08-10 NOTE — Discharge Summary (Addendum)
Physician Discharge Summary  [name] MWU:[id number] DOB: 1945-12-22 DOA: 08/08/2019  PCP: Lawerance Cruel, MD  Admit date: 08/08/2019 Discharge date: 08/10/2019  Admitted From: [facility] Disposition: SNF  Recommendations for Outpatient Follow-up:  1. Follow up with PCP in 1-2 weeks 2. Please obtain BMP in 4 days.  In the meantime hold Aldactone and potassium supplements. 3. Midodrine added.  Resume home Lasix tomorrow. 4. Doxycycline prescribed 5. Outpatient referral to palliative care. 6. Message sent to outpatient pulmonary nurse practitioner Tammy to arrange for outpatient follow-up appointment sooner than later within 1 week hopefully.   Discharge Condition: Stable CODE STATUS: DNR Diet recommendation: Heart healthy  Brief/Interim Summary:73 year old with history of severe pulmonary hypertension on home oxygen, HLD, emphysema brought to the hospital for worsening shortness of breath.  Tripped and fell at home causing right hip pain.  X-ray in the hospital showed subtle right subcapital femur fracture.  Orthopedic consulted who recommended conservative management.  Pulmonary consulted but recommended no intubation at this point.  Recommended to continue medication and gentle diuresis.  He was started on doxycycline with bronchodilators.  Echocardiogram showed ejection fraction 60 to 65%, severely reduced right ventricular pressures.  Blood pressure remained borderline low during the hospitalization.  He had acute kidney injury with hyperkalemia for which he was closely monitored.  His Aldactone and potassium supplements were discontinued.  Advised him to follow-up outpatient with lab work for the next 4 days for this. For his right subcapital femur fracture, Ortho recommended outpatient follow-up with conservative management.  Arrangements were made.  Seen by physical therapy who recommended home with supervision.  I will make outpatient home health arrangements with the help of case  manager prior to his discharge.  I have offered him to stay in the hospital for 24 hours while we watch his renal function, blood pressure in the setting of gentle diuresis but he prefers going home as his breathing is close to his baseline.  Discharge Diagnoses:  Active Problems:   GAD (generalized anxiety disorder)   Essential hypertension, benign   Pulmonary hypertension (HCC)   Chronic respiratory failure with hypoxia (HCC)   Acute diastolic CHF (congestive heart failure) (HCC)   Diastolic heart failure (HCC)   Cor pulmonale (chronic) (HCC)   Goals of care, counseling/discussion   Acute on chronic hypoxic respiratory distress, 3 L at rest-around his baseline Severe pulmonary hypertension, end-stage with cor pulmonale, group 1 and 3 Acute on chronic diastolic congestive heart failure History of COPD/emphysema -Oxygen at baseline requiring 3 L nasal cannula.  Soft blood pressure, will given Lasix 20 mg IV once.  He wants to go home therefore will advise him to resume his home Lasix starting tomorrow. -Sildenafil, macitentan. -Bronchodilators. -Echocardiogram EF 60 to 65%, grade 2 DD, severe TR, severely reduced right ventricular function dilated cardiomyopathy, elevated right ventricular systolic pressures, trivial pericardial effusion -Aspirin.  Home bronchodilators -Seen by pulmonary. -Doxycycline day 2/5.  Will give him discharge medications  Mechanical fall causing nondisplaced right subcapital femur fracture -PT/OT-nonweightbearing in the right lower extremity -Orthopedic-no surgical intervention at this point.  Follow-up with Dr. Stann Mainland in 2 weeks for repeat x-ray  Acute kidney injury -Admission creatinine 2.1.  Baseline creatinine 1.1.  Would recommend outpatient follow-up in 1 week.  Secondary to low blood pressure.  Hyperkalemia, slowly improving -Slowly improving, potassium and Aldactone stopped  Hypotension, likely from RV failure -Due to borderline low blood  pressure, on midodrine  Depression/anxiety -On Cymbalta, mirtazapine  Discussed extensively with family goals  of care regarding his poor prognosis with limited medical options.  Offered him palliative care at this time he would like to think about this and speak with his outpatient pulmonologist at [facility].  Need outpatient referral to palliative care.    Consultations:  Pulmonary  Subjective: No complaints feels better.  Discharge Exam: Vitals:   08/10/19 0800 08/10/19 1233  BP: (!) 91/58 (!) 91/58  Pulse: 65   Resp:    Temp: (!) 97.4 F (36.3 C)   SpO2: 99% 99%   Vitals:   08/10/19 0530 08/10/19 0730 08/10/19 0800 08/10/19 1233  BP: (!) 90/58  (!) 91/58 (!) 91/58  Pulse: 66 72 65   Resp: 16 16    Temp: 97.7 F (36.5 C)  (!) 97.4 F (36.3 C)   TempSrc: Oral  Oral   SpO2:  100% 99% 99%  Weight:      Height:        Discharge Instructions  Discharge Instructions    Amb Referral to Palliative Care   Complete by: As directed    Diet - low sodium heart healthy   Complete by: As directed    Increase activity slowly   Complete by: As directed      Allergies as of 08/10/2019      Reactions   Penicillins Hives   Has patient had a PCN reaction causing immediate rash, facial/tongue/throat swelling, SOB or lightheadedness with hypotension: No Has patient had a PCN reaction causing severe rash involving mucus membranes or skin necrosis: Yes Has patient had a PCN reaction that required hospitalization Unknown Has patient had a PCN reaction occurring within the last 10 years: No If all of the above answers are "NO", then may proceed with Cephalosporin use.   Nitrates, Organic    Symbicort [budesonide-formoterol Fumarate] Other (See Comments), Cough   Severe cough      Medication List    TAKE these medications   ACIDOPHILUS PO Take 1 capsule by mouth daily.   ammonium lactate 12 % cream Commonly known as: AMLACTIN Apply 1 application topically daily as needed  for dry skin.   aspirin 81 MG tablet Take 81 mg by mouth every evening.   Azelastine-Fluticasone 137-50 MCG/ACT Susp Commonly known as: Dymista Place 1-2 sprays into the nose daily as needed. What changed: reasons to take this   CENTRUM CARDIO PO Take 1 tablet by mouth daily.   clonazePAM 0.5 MG tablet Commonly known as: KLONOPIN Take 0.5 mg 3 (three) times daily as needed by mouth for anxiety.   doxycycline 100 MG tablet Commonly known as: VIBRA-TABS Take 1 tablet (100 mg total) by mouth every 12 (twelve) hours for 4 days.   DULoxetine 60 MG capsule Commonly known as: CYMBALTA Take 60 mg by mouth daily.   furosemide 20 MG tablet Commonly known as: LASIX Take 60 mg by mouth daily.   ipratropium 0.06 % nasal spray Commonly known as: ATROVENT Place 2 sprays into both nostrils 4 (four) times daily as needed for rhinitis.   LORazepam 0.5 MG tablet Commonly known as: ATIVAN Take 0.5 mg by mouth daily as needed for anxiety or sleep.   macitentan 10 MG tablet Commonly known as: Opsumit Take 1 tablet (10 mg total) by mouth daily.   midodrine 5 MG tablet Commonly known as: PROAMATINE Take 1 tablet (5 mg total) by mouth 3 (three) times daily with meals.   mirtazapine 15 MG tablet Commonly known as: REMERON Take 15 mg by mouth at bedtime.  potassium chloride 10 MEQ tablet Commonly known as: KLOR-CON Take 30 mEq by mouth daily in the afternoon.   rosuvastatin 40 MG tablet Commonly known as: CRESTOR Take 1 tablet (40 mg total) by mouth daily. Please make overdue appt with Dr. Irish Lack before anymore refills. 2nd attempt   sildenafil 20 MG tablet Commonly known as: REVATIO Take 60 mg by mouth 3 (three) times daily.   spironolactone 25 MG tablet Commonly known as: ALDACTONE Take 25 mg by mouth daily.   tadalafil 20 MG tablet Commonly known as: CIALIS Take 40 mg by mouth daily.   Trelegy Ellipta 100-62.5-25 MCG/INH Aepb Generic drug:  Fluticasone-Umeclidin-Vilant Inhale 1 puff into the lungs daily.   triamcinolone cream 0.1 % Commonly known as: KENALOG Apply 1 application topically daily as needed for irritation.   Uptravi 200 MCG Tabs Generic drug: Selexipag Take 400-600 mcg by mouth 2 (two) times daily. Take 2 tablets in the morning and 3 tablets at night   Vitaline CoQ10 100 MG Wafr Generic drug: Coenzyme Q10 Take 60 mg by mouth daily.      Follow-up Information    Nicholes Stairs, MD. Schedule an appointment as soon as possible for a visit.   Specialty: Orthopedic Surgery Contact information: [address] [place] [address] [place] [address] [phone]        Lawerance Cruel, MD. Schedule an appointment as soon as possible for a visit in 1 week(s).   Specialty: Family Medicine Contact information: [address] [place] [address] [phone]        Juanito Doom, MD. Schedule an appointment as soon as possible for a visit in 1 week(s).   Specialty: Pulmonary Disease Contact information: [place] [address] [phone]          Allergies  Allergen Reactions  . Penicillins Hives    Has patient had a PCN reaction causing immediate rash, facial/tongue/throat swelling, SOB or lightheadedness with hypotension: No Has patient had a PCN reaction causing severe rash involving mucus membranes or skin necrosis: Yes Has patient had a PCN reaction that required hospitalization Unknown Has patient had a PCN reaction occurring within the last 10 years: No If all of the above answers are "NO", then may proceed with Cephalosporin use.   . Nitrates, Organic   . Symbicort [Budesonide-Formoterol Fumarate] Other (See Comments) and Cough    Severe cough    You were cared for by a hospitalist during your hospital stay. If you have any questions about your discharge medications or the care you received while you were in the hospital after you are  discharged, you can call the unit and asked to speak with the hospitalist on call if the hospitalist that took care of you is not available. Once you are discharged, your primary care physician will handle any further medical issues. Please note that no refills for any discharge medications will be authorized once you are discharged, as it is imperative that you return to your primary care physician (or establish a relationship with a primary care physician if you do not have one) for your aftercare needs so that they can reassess your need for medications and monitor your lab values.   Procedures/Studies: CT HEAD WO CONTRAST  Result Date: 08/10/2019 CLINICAL DATA:  Syncope. Fall. EXAM: CT HEAD WITHOUT CONTRAST TECHNIQUE: Contiguous axial images were obtained from the base of the skull through the vertex without intravenous contrast. COMPARISON:  None. FINDINGS: Brain: Mild cortical atrophy.  Negative for hydrocephalus. Negative for acute infarct, hemorrhage, mass. Vascular: Negative for hyperdense vessel Skull: Negative Sinuses/Orbits: Negative Other: None IMPRESSION: Mild atrophy. No acute abnormality. Electronically Signed   By: Franchot Gallo M.D.   On: 08/10/2019 10:20   CT HIP RIGHT WO CONTRAST  Result Date: 08/08/2019 CLINICAL DATA:  Right hip pain secondary to a fall. EXAM: CT OF THE RIGHT HIP WITHOUT CONTRAST TECHNIQUE: Multidetector CT imaging of the right hip was performed according to the standard protocol. Multiplanar CT image reconstructions were also generated. COMPARISON:  Radiographs dated 08/08/2019 FINDINGS: Bones/Joint/Cartilage There is a very subtle subcapital fracture of the right femoral neck with no displacement. The visualized pelvic bones are intact. Muscles and Tendons Normal. Soft tissues Extensive sigmoid diverticulosis. Slight edema in the subcutaneous fat of the buttocks and proximal right femur, indeterminate. IMPRESSION: Subtle subcapital fracture of the right femoral  neck. Electronically Signed   By: Lorriane Shire M.D.   On: 08/08/2019 10:01   DG Chest Port 1 View  Result Date: 08/08/2019 CLINICAL DATA:  Shortness of breath. EXAM: PORTABLE CHEST 1 VIEW COMPARISON:  04/28/2017 FINDINGS: Cardiomegaly, new from prior exam. There is a globular configuration of the cardiopericardial silhouette. Atherosclerosis of the thoracic aorta. No focal airspace disease. No evidence of pulmonary edema. Mild emphysema, apical predominant. No pleural effusion or pneumothorax. No acute osseous abnormalities are seen. IMPRESSION: 1. Cardiomegaly, progressed from 2018. Slightly globular configuration of the cardiopericardial silhouette, which can be seen with underlying pericardial effusion. Consider echocardiogram. 2. Emphysema. Aortic Atherosclerosis (ICD10-I70.0) and Emphysema (ICD10-J43.9). Electronically Signed   By: Keith Rake M.D.   On: 08/08/2019 02:55   ECHOCARDIOGRAM COMPLETE  Result Date: 08/08/2019   ECHOCARDIOGRAM REPORT   Patient Name:   [name] Date of Exam: 08/08/2019 Medical Rec #:  [id number]      Height:       71.0 in Accession #:    [id number]     Weight:       165.3 lb Date of Birth:  1946-08-29      BSA:          1.94 m Patient Age:    17 years       BP:           80/63 mmHg Patient Gender: M              HR:           71 bpm. Exam Location:  Inpatient Procedure: 2D Echo Indications:    Pulmonary Embolus I26.99  History:        Patient has prior history of Echocardiogram examinations, most                 recent 05/27/2018. CAD, Pulmonary HTN; Risk Factors:Dyslipidemia.  Sonographer:    Mikki Santee RDCS (AE) Referring Phys: [place]  1. Left ventricular ejection fraction, by visual estimation, is 60 to 65%. The left ventricle has normal function. There is mildly increased left ventricular hypertrophy.  2. The left ventricle has no regional wall motion abnormalities.  3. Left ventricular diastolic parameters are consistent with  Grade II diastolic dysfunction (pseudonormalization).  4. Global right ventricle has severely reduced systolic function.The right ventricular size is severely enlarged. No increase in right ventricular wall thickness. D-shaped interventricular septum suggestive of RV pressure/volume overload.  5. The tricuspid valve is normal in structure. Tricuspid valve regurgitation is severe. Systolic flow reversal on the hepatic vein doppler pattern.  6. The aortic  valve is tricuspid. Aortic valve regurgitation is not visualized. Mild aortic valve sclerosis without stenosis.  7. Left atrial size was moderately dilated.  8. Right atrial size was severely dilated.  9. The mitral valve is normal in structure. Trace mitral valve regurgitation. No evidence of mitral stenosis. 10. The inferior vena cava is dilated in size with <50% respiratory variability, suggesting right atrial pressure of 15 mmHg. 11. The tricuspid regurgitant velocity is 3.79 m/s, and with an assumed right atrial pressure of 15 mmHg, the estimated right ventricular systolic pressure is severely elevated at 72.5 mmHg. 12. Trivial pericardial effusion is present. FINDINGS  Left Ventricle: Left ventricular ejection fraction, by visual estimation, is 60 to 65%. The left ventricle has normal function. The left ventricle has no regional wall motion abnormalities. The left ventricular internal cavity size was the left ventricle is normal in size. There is mildly increased left ventricular hypertrophy. Left ventricular diastolic parameters are consistent with Grade II diastolic dysfunction (pseudonormalization). Right Ventricle: The right ventricular size is severely enlarged. No increase in right ventricular wall thickness. Global RV systolic function is has severely reduced systolic function. The tricuspid regurgitant velocity is 3.79 m/s, and with an assumed right atrial pressure of 15 mmHg, the estimated right ventricular systolic pressure is severely elevated at  72.5 mmHg. Left Atrium: Left atrial size was moderately dilated. Right Atrium: Right atrial size was severely dilated Pericardium: Trivial pericardial effusion is present. Mitral Valve: The mitral valve is normal in structure. No evidence of mitral valve stenosis by observation. Trace mitral valve regurgitation. Tricuspid Valve: The tricuspid valve is normal in structure. Tricuspid valve regurgitation is severe. Aortic Valve: The aortic valve is tricuspid. Aortic valve regurgitation is not visualized. Mild aortic valve sclerosis is present, with no evidence of aortic valve stenosis. Pulmonic Valve: The pulmonic valve was normal in structure. Pulmonic valve regurgitation is not visualized. Aorta: The aortic root is normal in size and structure. Venous: The inferior vena cava is dilated in size with less than 50% respiratory variability, suggesting right atrial pressure of 15 mmHg. IAS/Shunts: No atrial level shunt detected by color flow Doppler.  LEFT VENTRICLE PLAX 2D LVIDd:         4.16 cm  Diastology LVIDs:         2.52 cm  LV e' lateral:   7.95 cm/s LV PW:         0.96 cm  LV E/e' lateral: 5.7 LV IVS:        0.83 cm  LV e' medial:    4.95 cm/s LVOT diam:     2.50 cm  LV E/e' medial:  9.2 LV SV:         54 ml LV SV Index:   27.87 LVOT Area:     4.91 cm  RIGHT VENTRICLE RV S prime:     5.18 cm/s TAPSE (M-mode): 1.0 cm LEFT ATRIUM             Index       RIGHT ATRIUM           Index LA diam:        5.00 cm 2.57 cm/m  RA Area:     33.00 cm LA Vol (A2C):   46.2 ml 23.76 ml/m RA Volume:   125.00 ml 64.28 ml/m LA Vol (A4C):   87.0 ml 44.74 ml/m LA Biplane Vol: 69.2 ml 35.58 ml/m  AORTIC VALVE LVOT Vmax:   58.10 cm/s LVOT Vmean:  35.200 cm/s LVOT VTI:  0.110 m  AORTA Ao Root diam: 3.50 cm MITRAL VALVE                        TRICUSPID VALVE MV Area (PHT): 4.31 cm             TR Peak grad:   57.5 mmHg MV PHT:        51.04 msec           TR Vmax:        379.00 cm/s MV Decel Time: 176 msec MV E velocity: 45.40 cm/s  103 cm/s  SHUNTS MV A velocity: 42.80 cm/s 70.3 cm/s Systemic VTI:  0.11 m MV E/A ratio:  1.06       1.5       Systemic Diam: 2.50 cm  Loralie Champagne MD Electronically signed by Loralie Champagne MD Signature Date/Time: 08/08/2019/3:36:21 PM    Final    DG Hip Unilat W or Wo Pelvis 2-3 Views Right  Result Date: 08/08/2019 CLINICAL DATA:  Fall with right hip pain EXAM: DG HIP (WITH OR WITHOUT PELVIS) 2-3V RIGHT COMPARISON:  None. FINDINGS: Limited assessment of the right femoral neck due to inward rotation. No definite fracture, although there is subtle cortical overlapping at the lateral femoral neck. No dislocation. Osteopenia. IMPRESSION: Subtle cortical irregularity at the lateral femoral neck, not definite for fracture. Depending on suspicion for fracture a CT or MRI could be obtained. Electronically Signed   By: Monte Fantasia M.D.   On: 08/08/2019 07:57   VAS [place] LOWER EXTREMITY VENOUS (DVT)  Result Date: 08/08/2019  Lower Venous Study Indications: Edema.  Comparison Study: no prior Performing Technologist: Abram Sander RVS  Examination Guidelines: A complete evaluation includes B-mode imaging, spectral Doppler, color Doppler, and power Doppler as needed of all accessible portions of each vessel. Bilateral testing is considered an integral part of a complete examination. Limited examinations for reoccurring indications may be performed as noted.  +---------+---------------+---------+-----------+----------+--------------+ RIGHT    CompressibilityPhasicitySpontaneityPropertiesThrombus Aging +---------+---------------+---------+-----------+----------+--------------+ CFV      Full           Yes      Yes                                 +---------+---------------+---------+-----------+----------+--------------+ SFJ      Full                                                        +---------+---------------+---------+-----------+----------+--------------+ FV Prox  Full                                                         +---------+---------------+---------+-----------+----------+--------------+ FV Mid   Full                                                        +---------+---------------+---------+-----------+----------+--------------+ FV DistalFull                                                        +---------+---------------+---------+-----------+----------+--------------+  PFV      Full                                                        +---------+---------------+---------+-----------+----------+--------------+ POP      Full           Yes      Yes                                 +---------+---------------+---------+-----------+----------+--------------+ PTV      Full                                                        +---------+---------------+---------+-----------+----------+--------------+ PERO                                                  Not visualized +---------+---------------+---------+-----------+----------+--------------+   +---------+---------------+---------+-----------+----------+--------------+ LEFT     CompressibilityPhasicitySpontaneityPropertiesThrombus Aging +---------+---------------+---------+-----------+----------+--------------+ CFV      Full           Yes      Yes                                 +---------+---------------+---------+-----------+----------+--------------+ SFJ      Full                                                        +---------+---------------+---------+-----------+----------+--------------+ FV Prox  Full                                                        +---------+---------------+---------+-----------+----------+--------------+ FV Mid   Full                                                        +---------+---------------+---------+-----------+----------+--------------+ FV DistalFull                                                         +---------+---------------+---------+-----------+----------+--------------+ PFV      Full                                                        +---------+---------------+---------+-----------+----------+--------------+  POP      Full           Yes      Yes                                 +---------+---------------+---------+-----------+----------+--------------+ PTV      Full                                                        +---------+---------------+---------+-----------+----------+--------------+ PERO                                                  Not visualized +---------+---------------+---------+-----------+----------+--------------+     Summary: Right: There is no evidence of deep vein thrombosis in the lower extremity. No cystic structure found in the popliteal fossa. Left: There is no evidence of deep vein thrombosis in the lower extremity. No cystic structure found in the popliteal fossa.  *See table(s) above for measurements and observations. Electronically signed by Harold Barban MD on 08/08/2019 at 5:28:13 PM.    Final       The results of significant diagnostics from this hospitalization (including imaging, microbiology, ancillary and laboratory) are listed below for reference.     Microbiology: Recent Results (from the past 240 hour(s))  SARS CORONAVIRUS 2 (TAT 6-24 HRS) Nasopharyngeal Nasopharyngeal Swab     Status: None   Collection Time: 08/08/19  4:30 AM   Specimen: Nasopharyngeal Swab  Result Value Ref Range Status   SARS Coronavirus 2 NEGATIVE NEGATIVE Final    Comment: (NOTE) SARS-CoV-2 target nucleic acids are NOT DETECTED. The SARS-CoV-2 RNA is generally detectable in upper and lower respiratory specimens during the acute phase of infection. Negative results do not preclude SARS-CoV-2 infection, do not rule out co-infections with other pathogens, and should not be used as the sole basis for treatment or other patient management  decisions. Negative results must be combined with clinical observations, patient history, and epidemiological information. The expected result is Negative. Fact Sheet for Patients: SugarRoll.be Fact Sheet for Healthcare Providers: https://www.woods-mathews.com/ This test is not yet approved or cleared by the [place] FDA and  has been authorized for detection and/or diagnosis of SARS-CoV-2 by FDA under an Emergency Use Authorization (EUA). This EUA will remain  in effect (meaning this test can be used) for the duration of the COVID-19 declaration under Section 56 4(b)(1) of the Act, 21 U.S.C. section 360bbb-3(b)(1), unless the authorization is terminated or revoked sooner. Performed at [facility], [place] [address] [place],  [address]   Respiratory Panel by RT PCR (Flu A&B, Covid) - Nasopharyngeal Swab     Status: None   Collection Time: 08/08/19  5:26 AM   Specimen: Nasopharyngeal Swab  Result Value Ref Range Status   SARS Coronavirus 2 by RT PCR NEGATIVE NEGATIVE Final    Comment: (NOTE) SARS-CoV-2 target nucleic acids are NOT DETECTED. The SARS-CoV-2 RNA is generally detectable in upper respiratoy specimens during the acute phase of infection. The lowest concentration of SARS-CoV-2 viral copies this assay can detect is 131 copies/mL. A negative result does not preclude SARS-Cov-2 infection and should not  be used as the sole basis for treatment or other patient management decisions. A negative result may occur with  improper specimen collection/handling, submission of specimen other than nasopharyngeal swab, presence of viral mutation(s) within the areas targeted by this assay, and inadequate number of viral copies (<131 copies/mL). A negative result must be combined with clinical observations, patient history, and epidemiological information. The expected result is Negative. Fact Sheet for Patients:   PinkCheek.be Fact Sheet for Healthcare Providers:  GravelBags.it This test is not yet ap proved or cleared by the [place] FDA and  has been authorized for detection and/or diagnosis of SARS-CoV-2 by FDA under an Emergency Use Authorization (EUA). This EUA will remain  in effect (meaning this test can be used) for the duration of the COVID-19 declaration under Section 564(b)(1) of the Act, 21 U.S.C. section 360bbb-3(b)(1), unless the authorization is terminated or revoked sooner.    Influenza A by PCR NEGATIVE NEGATIVE Final   Influenza B by PCR NEGATIVE NEGATIVE Final    Comment: (NOTE) The Xpert Xpress SARS-CoV-2/FLU/RSV assay is intended as an aid in  the diagnosis of influenza from Nasopharyngeal swab specimens and  should not be used as a sole basis for treatment. Nasal washings and  aspirates are unacceptable for Xpert Xpress SARS-CoV-2/FLU/RSV  testing. Fact Sheet for Patients: PinkCheek.be Fact Sheet for Healthcare Providers: GravelBags.it This test is not yet approved or cleared by the [place] FDA and  has been authorized for detection and/or diagnosis of SARS-CoV-2 by  FDA under an Emergency Use Authorization (EUA). This EUA will remain  in effect (meaning this test can be used) for the duration of the  Covid-19 declaration under Section 564(b)(1) of the Act, 21  U.S.C. section 360bbb-3(b)(1), unless the authorization is  terminated or revoked. Performed at [facility], [place] [address], [place], [place] [address]   Culture, blood (routine x 2)     Status: None (Preliminary result)   Collection Time: 08/08/19  5:55 AM   Specimen: BLOOD RIGHT WRIST  Result Value Ref Range Status   Specimen Description BLOOD RIGHT WRIST  Final   Special Requests   Final    BOTTLES DRAWN AEROBIC AND ANAEROBIC Blood Culture adequate volume   Culture   Final     NO GROWTH 2 DAYS Performed at [facility], [place] [address], [place], [place] [address]    Report Status PENDING  Incomplete  Culture, blood (routine x 2)     Status: None (Preliminary result)   Collection Time: 08/08/19  6:08 AM   Specimen: BLOOD  Result Value Ref Range Status   Specimen Description BLOOD RIGHT ANTECUBITAL  Final   Special Requests   Final    BOTTLES DRAWN AEROBIC AND ANAEROBIC Blood Culture adequate volume   Culture   Final    NO GROWTH 2 DAYS Performed at [facility], [place] [address], [place], [place] [address]    Report Status PENDING  Incomplete     Labs: BNP (last 3 results) Recent Labs    08/08/19 0245 08/10/19 0258  BNP 1,046.6* 8,638.1*   Basic Metabolic Panel: Recent Labs  Lab 08/08/19 0245 08/08/19 0609 08/09/19 0319 08/10/19 0258  NA 136 135 136 136  K 5.6* 5.4* 5.7* 5.4*  CL 101  --  102 101  CO2 18*  --  18* 21*  GLUCOSE 157*  --  105* 97  BUN 47*  --  58* 74*  CREATININE 2.09*  --  2.17* 2.51*  CALCIUM 8.6*  --  8.6* 8.4*   Liver Function Tests: Recent Labs  Lab 08/08/19 0245  AST 64*  ALT 32  ALKPHOS 144*  BILITOT 2.2*  PROT 6.7  ALBUMIN 3.4*   No results for input(s): LIPASE, AMYLASE in the last 168 hours. No results for input(s): AMMONIA in the last 168 hours. CBC: Recent Labs  Lab 08/08/19 0245 08/08/19 0609  WBC 7.7  --   NEUTROABS 6.3  --   HGB 16.3 16.3  HCT 50.5 48.0  MCV 95.8  --   PLT 102*  --    Cardiac Enzymes: Recent Labs  Lab 08/09/19 1253  CKTOTAL 90   BNP: Invalid input(s): POCBNP CBG: No results for input(s): GLUCAP in the last 168 hours. D-Dimer No results for input(s): DDIMER in the last 72 hours. Hgb A1c No results for input(s): HGBA1C in the last 72 hours. Lipid Profile No results for input(s): CHOL, HDL, LDLCALC, TRIG, CHOLHDL, LDLDIRECT in the last 72 hours. Thyroid function studies No results for input(s): TSH, T4TOTAL, T3FREE, THYROIDAB in the last 72  hours.  Invalid input(s): FREET3 Anemia work up No results for input(s): VITAMINB12, FOLATE, FERRITIN, TIBC, IRON, RETICCTPCT in the last 72 hours. Urinalysis    Component Value Date/Time   COLORURINE AMBER (A) 08/08/2019 1238   APPEARANCEUR HAZY (A) 08/08/2019 1238   LABSPEC 1.011 08/08/2019 1238   PHURINE 5.0 08/08/2019 1238   GLUCOSEU NEGATIVE 08/08/2019 1238   HGBUR NEGATIVE 08/08/2019 [place] 08/08/2019 1238   KETONESUR NEGATIVE 08/08/2019 1238   PROTEINUR 100 (A) 08/08/2019 1238   NITRITE NEGATIVE 08/08/2019 1238   LEUKOCYTESUR NEGATIVE 08/08/2019 1238   Sepsis Labs Invalid input(s): PROCALCITONIN,  WBC,  LACTICIDVEN Microbiology Recent Results (from the past 240 hour(s))  SARS CORONAVIRUS 2 (TAT 6-24 HRS) Nasopharyngeal Nasopharyngeal Swab     Status: None   Collection Time: 08/08/19  4:30 AM   Specimen: Nasopharyngeal Swab  Result Value Ref Range Status   SARS Coronavirus 2 NEGATIVE NEGATIVE Final    Comment: (NOTE) SARS-CoV-2 target nucleic acids are NOT DETECTED. The SARS-CoV-2 RNA is generally detectable in upper and lower respiratory specimens during the acute phase of infection. Negative results do not preclude SARS-CoV-2 infection, do not rule out co-infections with other pathogens, and should not be used as the sole basis for treatment or other patient management decisions. Negative results must be combined with clinical observations, patient history, and epidemiological information. The expected result is Negative. Fact Sheet for Patients: SugarRoll.be Fact Sheet for Healthcare Providers: https://www.woods-mathews.com/ This test is not yet approved or cleared by the [place] FDA and  has been authorized for detection and/or diagnosis of SARS-CoV-2 by FDA under an Emergency Use Authorization (EUA). This EUA will remain  in effect (meaning this test can be used) for the duration of  the COVID-19 declaration under Section 56 4(b)(1) of the Act, 21 U.S.C. section 360bbb-3(b)(1), unless the authorization is terminated or revoked sooner. Performed at [facility], [place] [address], [place], [place] [address]   Respiratory Panel by RT PCR (Flu A&B, Covid) - Nasopharyngeal Swab     Status: None   Collection Time: 08/08/19  5:26 AM   Specimen: Nasopharyngeal Swab  Result Value Ref Range Status   SARS Coronavirus 2 by RT PCR NEGATIVE NEGATIVE Final    Comment: (NOTE) SARS-CoV-2 target nucleic acids are NOT DETECTED. The SARS-CoV-2 RNA is generally detectable in upper respiratoy specimens during the acute phase  of infection. The lowest concentration of SARS-CoV-2 viral copies this assay can detect is 131 copies/mL. A negative result does not preclude SARS-Cov-2 infection and should not be used as the sole basis for treatment or other patient management decisions. A negative result may occur with  improper specimen collection/handling, submission of specimen other than nasopharyngeal swab, presence of viral mutation(s) within the areas targeted by this assay, and inadequate number of viral copies (<131 copies/mL). A negative result must be combined with clinical observations, patient history, and epidemiological information. The expected result is Negative. Fact Sheet for Patients:  PinkCheek.be Fact Sheet for Healthcare Providers:  GravelBags.it This test is not yet ap proved or cleared by the [place] FDA and  has been authorized for detection and/or diagnosis of SARS-CoV-2 by FDA under an Emergency Use Authorization (EUA). This EUA will remain  in effect (meaning this test can be used) for the duration of the COVID-19 declaration under Section 564(b)(1) of the Act, 21 U.S.C. section 360bbb-3(b)(1), unless the authorization is terminated or revoked sooner.    Influenza A by PCR NEGATIVE NEGATIVE  Final   Influenza B by PCR NEGATIVE NEGATIVE Final    Comment: (NOTE) The Xpert Xpress SARS-CoV-2/FLU/RSV assay is intended as an aid in  the diagnosis of influenza from Nasopharyngeal swab specimens and  should not be used as a sole basis for treatment. Nasal washings and  aspirates are unacceptable for Xpert Xpress SARS-CoV-2/FLU/RSV  testing. Fact Sheet for Patients: PinkCheek.be Fact Sheet for Healthcare Providers: GravelBags.it This test is not yet approved or cleared by the [place] FDA and  has been authorized for detection and/or diagnosis of SARS-CoV-2 by  FDA under an Emergency Use Authorization (EUA). This EUA will remain  in effect (meaning this test can be used) for the duration of the  Covid-19 declaration under Section 564(b)(1) of the Act, 21  U.S.C. section 360bbb-3(b)(1), unless the authorization is  terminated or revoked. Performed at [facility], [place] [address] [place], [place] [address]   Culture, blood (routine x 2)     Status: None (Preliminary result)   Collection Time: 08/08/19  5:55 AM   Specimen: BLOOD RIGHT WRIST  Result Value Ref Range Status   Specimen Description BLOOD RIGHT WRIST  Final   Special Requests   Final    BOTTLES DRAWN AEROBIC AND ANAEROBIC Blood Culture adequate volume   Culture   Final    NO GROWTH 2 DAYS Performed at [facility], [place] [address], [place], [place] [address]    Report Status PENDING  Incomplete  Culture, blood (routine x 2)     Status: None (Preliminary result)   Collection Time: 08/08/19  6:08 AM   Specimen: BLOOD  Result Value Ref Range Status   Specimen Description BLOOD RIGHT ANTECUBITAL  Final   Special Requests   Final    BOTTLES DRAWN AEROBIC AND ANAEROBIC Blood Culture adequate volume   Culture   Final    NO GROWTH 2 DAYS Performed at [facility], [place] [address] [place],  [address]    Report Status PENDING   Incomplete     Time coordinating discharge:  I have spent 35 minutes face to face with the patient and on the ward discussing the patients care, assessment, plan and disposition with other care givers. >50% of the time was devoted counseling the patient about the risks and benefits of treatment/Discharge disposition and coordinating care.   SIGNED:   Ankit Chirag  Reesa Chew, MD  [facility] 08/10/2019, 12:54 PM   If 7PM-7AM, please contact night-coverage

## 2019-08-10 NOTE — Progress Notes (Addendum)
PROGRESS NOTE    [name] Sites  EXB:[id number] DOB: 04/24/1946 DOA: 08/08/2019 PCP: Lawerance Cruel, MD   Brief Narrative:  73 year old with history of severe pulmonary hypertension on home oxygen, HLD, emphysema brought to the hospital for worsening shortness of breath.  Tripped and fell at home causing right hip pain.  X-ray in the hospital showed subtle right subcapital femur fracture.  Orthopedic consulted who recommended conservative management.  Pulmonary consulted but recommended no intubation at this point.   Assessment & Plan:   Active Problems:   GAD (generalized anxiety disorder)   Essential hypertension, benign   Pulmonary hypertension (HCC)   Chronic respiratory failure with hypoxia (HCC)   Acute diastolic CHF (congestive heart failure) (HCC)   Diastolic heart failure (HCC)   Cor pulmonale (chronic) (HCC)  Acute on chronic hypoxic respiratory distress, 3 L at rest Severe pulmonary hypertension, end-stage with cor pulmonale, group 1 and 3 Acute on chronic diastolic congestive heart failure History of COPD/emphysema -Oxygen at baseline requiring 3 L nasal cannula.  Soft blood pressure, will given Lasix 20 mg IV once.  He wants to go home therefore will advise him to resume his home Lasix starting tomorrow. -Sildenafil, macitentan. -Bronchodilators. -Echocardiogram EF 60 to 65%, grade 2 DD, severe TR, severely reduced right ventricular function dilated cardiomyopathy, elevated right ventricular systolic pressures, trivial pericardial effusion -Aspirin.  Home bronchodilators -Seen by pulmonary. -Doxycycline day 2/5.  Will give him discharge medications  Mechanical fall causing nondisplaced right subcapital femur fracture -PT/OT-nonweightbearing in the right lower extremity -Orthopedic-no surgical intervention at this point.  Follow-up with Dr. Stann Mainland in 2 weeks for repeat x-ray  Acute kidney injury -Admission creatinine 2.1.  Baseline creatinine 1.1.  Would  recommend outpatient follow-up in 1 week.  Secondary to low blood pressure.  Hyperkalemia, slowly improving -Slowly improving, potassium and Aldactone stopped  Hypotension, likely from RV failure -Due to borderline low blood pressure, on midodrine  Depression/anxiety -On Cymbalta, mirtazapine   Discussed extensively with family goals of care regarding his poor prognosis with limited medical options.  Offered him palliative care at this time he would like to think about this and speak with his outpatient pulmonologist at [facility].  DVT prophylaxis: Lovenox Code Status: DNR Family Communication: None at bedside Disposition Plan: He wants to go home after getting IV Lasix therefore will transition him home.  Overall due to chronicity and severity of his illness that there is not much room for improvement in this point.  Any further hospital stay will not be as beneficial.  Consultants:   Pulmonary  Procedures:   None  Antimicrobials:   Doxycycline day 2   Subjective: Low blood pressures yesterday therefore unable to give evening IV Lasix.  Overall feeling better.  Spoke with him extensively regarding goals of care discussion, tells me he has already discussed this with his wife.  In terms of long-term palliative care discussion, he would like to discuss this with his pulmonologist at [facility]. I have offered him inpatient palliative care which he will consider.  Review of Systems Otherwise negative except as per HPI, including: General = no fevers, chills, dizziness, malaise, fatigue HEENT/EYES = negative for pain, redness, loss of vision, double vision, blurred vision, loss of hearing, sore throat, hoarseness, dysphagia Cardiovascular= negative for chest pain, palpitation, murmurs, lower extremity swelling Respiratory/lungs= negative for, hemoptysis, wheezing, mucus production Gastrointestinal= negative for nausea, vomiting,, abdominal pain, melena, hematemesis Genitourinary=  negative for Dysuria, Hematuria, Change in Urinary Frequency MSK = Negative for arthralgia,  myalgias, Back Pain, Joint swelling  Neurology= Negative for headache, seizures, numbness, tingling  Psychiatry= Negative for anxiety, depression, suicidal and homocidal ideation Allergy/Immunology= Medication/Food allergy as listed  Skin= Negative for Rash, lesions, ulcers, itching   Objective: Vitals:   08/10/19 0329 08/10/19 0530 08/10/19 0730 08/10/19 0800  BP:  (!) 90/58  (!) 91/58  Pulse:  66 72 65  Resp:  16 16   Temp:  97.7 F (36.5 C)  (!) 97.4 F (36.3 C)  TempSrc:  Oral  Oral  SpO2:   100% 99%  Weight: 68.1 kg     Height:        Intake/Output Summary (Last 24 hours) at 08/10/2019 1129 Last data filed at 08/10/2019 0918 Gross per 24 hour  Intake 780 ml  Output 625 ml  Net 155 ml   Filed Weights   08/08/19 1906 08/09/19 0406 08/10/19 0329  Weight: 69.9 kg 69.8 kg 68.1 kg    Examination:  Constitutional: 3 L nasal cannula.  Chronically ill and frail appearing. Respiratory: Diffuse diminished breath sounds but improved compared to yesterday Cardiovascular: Normal sinus rhythm, no rubs Abdomen: Nontender nondistended good bowel sounds Musculoskeletal: No edema noted Skin: Cyanotic bilateral ear, no step in his lips.  Chronic. Neurologic: CN 2-12 grossly intact.  And nonfocal Psychiatric: Normal judgment and insight. Alert and oriented x 3. Normal mood.     Data Reviewed:   CBC: Recent Labs  Lab 08/08/19 0245 08/08/19 0609  WBC 7.7  --   NEUTROABS 6.3  --   HGB 16.3 16.3  HCT 50.5 48.0  MCV 95.8  --   PLT 102*  --    Basic Metabolic Panel: Recent Labs  Lab 08/08/19 0245 08/08/19 0609 08/09/19 0319 08/10/19 0258  NA 136 135 136 136  K 5.6* 5.4* 5.7* 5.4*  CL 101  --  102 101  CO2 18*  --  18* 21*  GLUCOSE 157*  --  105* 97  BUN 47*  --  58* 74*  CREATININE 2.09*  --  2.17* 2.51*  CALCIUM 8.6*  --  8.6* 8.4*   GFR: Estimated Creatinine  Clearance: 25.2 mL/min (A) (by C-G formula based on SCr of 2.51 mg/dL (H)). Liver Function Tests: Recent Labs  Lab 08/08/19 0245  AST 64*  ALT 32  ALKPHOS 144*  BILITOT 2.2*  PROT 6.7  ALBUMIN 3.4*   No results for input(s): LIPASE, AMYLASE in the last 168 hours. No results for input(s): AMMONIA in the last 168 hours. Coagulation Profile: No results for input(s): INR, PROTIME in the last 168 hours. Cardiac Enzymes: Recent Labs  Lab 08/09/19 1253  CKTOTAL 90   BNP (last 3 results) Recent Labs    08/19/18 0936  PROBNP 659.0*   HbA1C: No results for input(s): HGBA1C in the last 72 hours. CBG: No results for input(s): GLUCAP in the last 168 hours. Lipid Profile: No results for input(s): CHOL, HDL, LDLCALC, TRIG, CHOLHDL, LDLDIRECT in the last 72 hours. Thyroid Function Tests: No results for input(s): TSH, T4TOTAL, FREET4, T3FREE, THYROIDAB in the last 72 hours. Anemia Panel: No results for input(s): VITAMINB12, FOLATE, FERRITIN, TIBC, IRON, RETICCTPCT in the last 72 hours. Sepsis Labs: Recent Labs  Lab 08/08/19 0245 08/08/19 0600 08/09/19 0319 08/10/19 0258  PROCALCITON  --  0.23 1.19 1.14  LATICACIDVEN 5.4* 3.2*  --   --     Recent Results (from the past 240 hour(s))  SARS CORONAVIRUS 2 (TAT 6-24 HRS) Nasopharyngeal Nasopharyngeal Swab  Status: None   Collection Time: 08/08/19  4:30 AM   Specimen: Nasopharyngeal Swab  Result Value Ref Range Status   SARS Coronavirus 2 NEGATIVE NEGATIVE Final    Comment: (NOTE) SARS-CoV-2 target nucleic acids are NOT DETECTED. The SARS-CoV-2 RNA is generally detectable in upper and lower respiratory specimens during the acute phase of infection. Negative results do not preclude SARS-CoV-2 infection, do not rule out co-infections with other pathogens, and should not be used as the sole basis for treatment or other patient management decisions. Negative results must be combined with clinical observations, patient history,  and epidemiological information. The expected result is Negative. Fact Sheet for Patients: SugarRoll.be Fact Sheet for Healthcare Providers: https://www.woods-mathews.com/ This test is not yet approved or cleared by the [place] FDA and  has been authorized for detection and/or diagnosis of SARS-CoV-2 by FDA under an Emergency Use Authorization (EUA). This EUA will remain  in effect (meaning this test can be used) for the duration of the COVID-19 declaration under Section 56 4(b)(1) of the Act, 21 U.S.C. section 360bbb-3(b)(1), unless the authorization is terminated or revoked sooner. Performed at [facility], [place] [address] [place], [place] [address]   Respiratory Panel by RT PCR (Flu A&B, Covid) - Nasopharyngeal Swab     Status: None   Collection Time: 08/08/19  5:26 AM   Specimen: Nasopharyngeal Swab  Result Value Ref Range Status   SARS Coronavirus 2 by RT PCR NEGATIVE NEGATIVE Final    Comment: (NOTE) SARS-CoV-2 target nucleic acids are NOT DETECTED. The SARS-CoV-2 RNA is generally detectable in upper respiratoy specimens during the acute phase of infection. The lowest concentration of SARS-CoV-2 viral copies this assay can detect is 131 copies/mL. A negative result does not preclude SARS-Cov-2 infection and should not be used as the sole basis for treatment or other patient management decisions. A negative result may occur with  improper specimen collection/handling, submission of specimen other than nasopharyngeal swab, presence of viral mutation(s) within the areas targeted by this assay, and inadequate number of viral copies (<131 copies/mL). A negative result must be combined with clinical observations, patient history, and epidemiological information. The expected result is Negative. Fact Sheet for Patients:  PinkCheek.be Fact Sheet for Healthcare Providers:   GravelBags.it This test is not yet ap proved or cleared by the [place] FDA and  has been authorized for detection and/or diagnosis of SARS-CoV-2 by FDA under an Emergency Use Authorization (EUA). This EUA will remain  in effect (meaning this test can be used) for the duration of the COVID-19 declaration under Section 564(b)(1) of the Act, 21 U.S.C. section 360bbb-3(b)(1), unless the authorization is terminated or revoked sooner.    Influenza A by PCR NEGATIVE NEGATIVE Final   Influenza B by PCR NEGATIVE NEGATIVE Final    Comment: (NOTE) The Xpert Xpress SARS-CoV-2/FLU/RSV assay is intended as an aid in  the diagnosis of influenza from Nasopharyngeal swab specimens and  should not be used as a sole basis for treatment. Nasal washings and  aspirates are unacceptable for Xpert Xpress SARS-CoV-2/FLU/RSV  testing. Fact Sheet for Patients: PinkCheek.be Fact Sheet for Healthcare Providers: GravelBags.it This test is not yet approved or cleared by the [place] FDA and  has been authorized for detection and/or diagnosis of SARS-CoV-2 by  FDA under an Emergency Use Authorization (EUA). This EUA will remain  in effect (meaning this test can be used) for the duration of the  Covid-19 declaration under Section 564(b)(1) of the Act, 21  U.S.C. section 360bbb-3(b)(1), unless the authorization is  terminated or revoked. Performed at [facility], [place] [address] [place], [place] [address]   Culture, blood (routine x 2)     Status: None (Preliminary result)   Collection Time: 08/08/19  5:55 AM   Specimen: BLOOD RIGHT WRIST  Result Value Ref Range Status   Specimen Description BLOOD RIGHT WRIST  Final   Special Requests   Final    BOTTLES DRAWN AEROBIC AND ANAEROBIC Blood Culture adequate volume   Culture   Final    NO GROWTH 2 DAYS Performed at [facility], [place] [address],  [place], [place] [address]    Report Status PENDING  Incomplete  Culture, blood (routine x 2)     Status: None (Preliminary result)   Collection Time: 08/08/19  6:08 AM   Specimen: BLOOD  Result Value Ref Range Status   Specimen Description BLOOD RIGHT ANTECUBITAL  Final   Special Requests   Final    BOTTLES DRAWN AEROBIC AND ANAEROBIC Blood Culture adequate volume   Culture   Final    NO GROWTH 2 DAYS Performed at [facility], [place] [address], [place], [place] [address]    Report Status PENDING  Incomplete         Radiology Studies: CT HEAD WO CONTRAST  Result Date: 08/10/2019 CLINICAL DATA:  Syncope. Fall. EXAM: CT HEAD WITHOUT CONTRAST TECHNIQUE: Contiguous axial images were obtained from the base of the skull through the vertex without intravenous contrast. COMPARISON:  None. FINDINGS: Brain: Mild cortical atrophy. Negative for hydrocephalus. Negative for acute infarct, hemorrhage, mass. Vascular: Negative for hyperdense vessel Skull: Negative Sinuses/Orbits: Negative Other: None IMPRESSION: Mild atrophy. No acute abnormality. Electronically Signed   By: Franchot Gallo M.D.   On: 08/10/2019 10:20        Scheduled Meds: . aspirin EC  81 mg Oral QPM  . doxycycline  100 mg Oral Q12H  . DULoxetine  60 mg Oral Daily  . enoxaparin (LOVENOX) injection  30 mg Subcutaneous Q24H  . fluticasone furoate-vilanterol  1 puff Inhalation Daily  . [START ON 08/11/2019] furosemide  60 mg Oral BID  . lactobacillus acidophilus  2 tablet Oral Daily  . macitentan  10 mg Oral Daily  . midodrine  5 mg Oral TID WC  . mirtazapine  15 mg Oral QHS  . multivitamin with minerals  1 tablet Oral Daily  . rosuvastatin  40 mg Oral Daily  . Selexipag  400 mcg Oral q morning - 10a  . Selexipag  600 mcg Oral QHS  . sodium chloride flush  3 mL Intravenous Q12H  . tadalafil  40 mg Oral Daily  . umeclidinium bromide  1 puff Inhalation Daily   Continuous Infusions: . sodium chloride Stopped (08/08/19  0700)  . sodium chloride       LOS: 2 days   Time spent=25 mins    Layla Gramm Arsenio Loader, MD [facility]  If 7PM-7AM, please contact night-coverage  08/10/2019, 11:29 AM

## 2019-08-10 NOTE — Progress Notes (Signed)
During rounds with MD patient ok to go home later if desires after Lasix given.   Left unit for CT approx 0951, returned at approx 1030  Paged Amin and informed pt. would like results of CT scan then ok with being discharged.Pt. would like to know who is his pulmonologist.upon callback patient spoke with Reesa Chew via RN phone, RN informed patient per MD CT of head negative. Amin stated patient pulmonologist would be McQuaid or may be In[facility].   Called report at 1555 to [place] at [facility]. Informed of cyanotic appearance, NWB on right side and informed use pulse ox on forehead.   Selexipag picked up from pharm by RN at approx 1630 and given to PTAR with AVS and discharge paperwork.   Reviewed D/C paperwork with patient and wife at bedside at approx, 1510, provided wife with copy of discharge paperwork.

## 2019-08-11 ENCOUNTER — Encounter: Payer: Self-pay | Admitting: Internal Medicine

## 2019-08-11 ENCOUNTER — Non-Acute Institutional Stay (SKILLED_NURSING_FACILITY): Payer: Medicare Other | Admitting: Internal Medicine

## 2019-08-11 DIAGNOSIS — I2781 Cor pulmonale (chronic): Secondary | ICD-10-CM | POA: Diagnosis not present

## 2019-08-11 DIAGNOSIS — I5031 Acute diastolic (congestive) heart failure: Secondary | ICD-10-CM | POA: Diagnosis not present

## 2019-08-11 DIAGNOSIS — N189 Chronic kidney disease, unspecified: Secondary | ICD-10-CM

## 2019-08-11 DIAGNOSIS — L219 Seborrheic dermatitis, unspecified: Secondary | ICD-10-CM

## 2019-08-11 DIAGNOSIS — E782 Mixed hyperlipidemia: Secondary | ICD-10-CM

## 2019-08-11 DIAGNOSIS — I25118 Atherosclerotic heart disease of native coronary artery with other forms of angina pectoris: Secondary | ICD-10-CM

## 2019-08-11 DIAGNOSIS — I272 Pulmonary hypertension, unspecified: Secondary | ICD-10-CM

## 2019-08-11 DIAGNOSIS — N289 Disorder of kidney and ureter, unspecified: Secondary | ICD-10-CM

## 2019-08-11 DIAGNOSIS — J9611 Chronic respiratory failure with hypoxia: Secondary | ICD-10-CM

## 2019-08-11 DIAGNOSIS — F419 Anxiety disorder, unspecified: Secondary | ICD-10-CM

## 2019-08-11 DIAGNOSIS — I7300 Raynaud's syndrome without gangrene: Secondary | ICD-10-CM

## 2019-08-11 NOTE — Progress Notes (Signed)
Provider:  Veleta Miners MD Location:    [facility] Room Number: 31 Place of Service:  SNF ([phone])  PCP: Lawerance Cruel, MD Patient Care Team: Lawerance Cruel, MD as PCP - General [facility] Medicine)  Extended Emergency Contact Information Primary Emergency Contact: Edwards,Colette Address: [place], [address]          [place], [place] [address] [name] of [place] Phone: [phone] Relation: Spouse  Code Status: Full Code Goals of Care: Advanced Directive information Advanced Directives 08/11/2019  Does Patient Have a Medical Advance Directive? No  Type of Advance Directive -  Does patient want to make changes to medical advance directive? -  Copy of [place] in Chart? -  Would patient like information on creating a medical advance directive? No - Patient declined  Pre-existing out of facility DNR order (yellow form or pink MOST form) -      Chief Complaint  Patient presents with  . New Admit To SNF    Admission to SNF    HPI: Patient is a 73 y.o. male seen today for admission to SNF for Therapy and Care  Patient was admitted in the hospital from 12/8 -12/10  for acute on chronic respiratory distress, pulmonary hypertension, worsening CKD and nondisplaced right Subcapital femur fracture Patient has a history of severe pulmonary hypertension with cor pulmonale.  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 He tripped and fell at home and had a right hip pain.  He also had some worsening of his shortness of breath.  He was sent to ED for further evaluation.  In ED he was found to have subtle right subcapital femur fracture.  But as he is high risk for significantly patient it was decided to be managed conservatively. He also had acute renal insufficiency.  He was treated with gentle diuresis as his BNP was elevated too.   He also was started on doxycycline His echo showed EF of 60 to 65% but severely reduced right systolic function with severe TR Also had Dopplers of lower extremity which were negative for Thrombus With diuresis patient renal function has been worsening. Due to his low blood pressure he was also started on midodrine Patient is now in SNF for therapy. He continues to have some shortness of breath that he states is mostly at baseline.  Denies any cough.  He does look anxious. Per his wife he is not very ambulatory.  Gets very tired and short of breath.  Patient states he uses wheelchair and motorized wheelchair Past Medical History:  Diagnosis Date  . CAD (coronary artery disease)   . GAD (generalized anxiety disorder)   . Hyperlipidemia   . Overweight(278.02)   . Pulmonary hypertension ([place])    Past Surgical History:  Procedure Laterality Date  . ANAL FISSURE REPAIR    . CARDIAC CATHETERIZATION  2005  . RIGHT/LEFT HEART CATH AND CORONARY ANGIOGRAPHY N/A 11/24/2016   Procedure: Right/Left Heart Cath and Coronary Angiography;  Surgeon: Jettie Booze, MD;  Location: [facility];  Service: Cardiovascular;  Laterality: N/A;  . SKIN CANCER EXCISION    . TONSILLECTOMY  1955  . UMBILICAL HERNIA REPAIR  10/07/2011   Procedure: HERNIA REPAIR UMBILICAL ADULT;  Surgeon: Rolm Bookbinder, MD;  Location: [facility] ORS;  Service: General;  Laterality: N/A;  with mesh  reports that he quit smoking about 33 years ago. He has a 18.00 pack-year smoking history. He has never used smokeless tobacco. He reports current alcohol use. He reports that he does not use drugs. Social History   Socioeconomic History  . Marital status: Married    Spouse name: Not on file  . Number of children: Not on file  . Years of education: Not on file  . Highest education level: Not on file  Occupational History  . Not on file  Tobacco Use  . Smoking status: Former Smoker    Packs/day: 1.00    Years: 18.00     Pack years: 18.00    Quit date: 09/27/1985    Years since quitting: 33.8  . Smokeless tobacco: Never Used  Substance and Sexual Activity  . Alcohol use: Yes  . Drug use: No  . Sexual activity: Not on file  Other Topics Concern  . Not on file  Social History Narrative  . Not on file   Social Determinants of Health   Financial Resource Strain:   . Difficulty of Paying Living Expenses: Not on file  Food Insecurity:   . Worried About Charity fundraiser in the Last Year: Not on file  . Ran Out of Food in the Last Year: Not on file  Transportation Needs:   . Lack of Transportation (Medical): Not on file  . Lack of Transportation (Non-Medical): Not on file  Physical Activity:   . Days of Exercise per Week: Not on file  . Minutes of Exercise per Session: Not on file  Stress:   . Feeling of Stress : Not on file  Social Connections:   . Frequency of Communication with Friends and Family: Not on file  . Frequency of Social Gatherings with Friends and Family: Not on file  . Attends Religious Services: Not on file  . Active Member of Clubs or Organizations: Not on file  . Attends Archivist Meetings: Not on file  . Marital Status: Not on file  Intimate Partner Violence:   . Fear of Current or Ex-Partner: Not on file  . Emotionally Abused: Not on file  . Physically Abused: Not on file  . Sexually Abused: Not on file    Functional Status Survey:    Family History  Problem Relation Age of Onset  . Cancer Sister        breast  . Heart disease Father   . Heart attack Father   . Stroke Father     Health Maintenance  Topic Date Due  . Hepatitis C Screening  1945/12/13  . COLONOSCOPY  10/17/1995  . TETANUS/TDAP  01/28/2022  . INFLUENZA VACCINE  Completed  . PNA vac Low Risk Adult  Completed    Allergies  Allergen Reactions  . Penicillins Hives    Has patient had a PCN reaction causing immediate rash, facial/tongue/throat swelling, SOB or lightheadedness with  hypotension: No Has patient had a PCN reaction causing severe rash involving mucus membranes or skin necrosis: Yes Has patient had a PCN reaction that required hospitalization Unknown Has patient had a PCN reaction occurring within the last 10 years: No If all of the above answers are "NO", then may proceed with Cephalosporin use.   . Nitrates, Organic   . Symbicort [Budesonide-Formoterol Fumarate] Other (See Comments) and Cough    Severe cough    Allergies as of 08/11/2019      Reactions   Penicillins Hives   Has patient had a  PCN reaction causing immediate rash, facial/tongue/throat swelling, SOB or lightheadedness with hypotension: No Has patient had a PCN reaction causing severe rash involving mucus membranes or skin necrosis: Yes Has patient had a PCN reaction that required hospitalization Unknown Has patient had a PCN reaction occurring within the last 10 years: No If all of the above answers are "NO", then may proceed with Cephalosporin use.   Nitrates, Organic    Symbicort [budesonide-formoterol Fumarate] Other (See Comments), Cough   Severe cough      Medication List       Accurate as of August 11, 2019 10:37 AM. If you have any questions, ask your nurse or doctor.        STOP taking these medications   potassium chloride 10 MEQ tablet Commonly known as: KLOR-CON Stopped by: Virgie Dad, MD   spironolactone 25 MG tablet Commonly known as: ALDACTONE Stopped by: Virgie Dad, MD     TAKE these medications   ACIDOPHILUS PO Take 1 capsule by mouth daily.   ammonium lactate 12 % cream Commonly known as: AMLACTIN Apply 1 application topically daily as needed for dry skin.   aspirin 81 MG tablet Take 81 mg by mouth every evening.   Azelastine-Fluticasone 137-50 MCG/ACT Susp Commonly known as: Dymista Place 1-2 sprays into the nose daily as needed. What changed: reasons to take this   CENTRUM CARDIO PO Take 1 tablet by mouth daily.   clonazePAM 0.5  MG tablet Commonly known as: KLONOPIN Take 1 tablet (0.5 mg total) by mouth 3 (three) times daily as needed for up to 15 doses for anxiety.   doxycycline 100 MG tablet Commonly known as: VIBRA-TABS Take 1 tablet (100 mg total) by mouth every 12 (twelve) hours for 4 days.   DULoxetine 60 MG capsule Commonly known as: CYMBALTA Take 60 mg by mouth daily.   furosemide 20 MG tablet Commonly known as: LASIX Take 60 mg by mouth daily.   ipratropium 0.06 % nasal spray Commonly known as: ATROVENT Place 2 sprays into both nostrils 4 (four) times daily as needed for rhinitis.   LORazepam 0.5 MG tablet Commonly known as: ATIVAN Take 1 tablet (0.5 mg total) by mouth daily as needed for up to 5 doses for anxiety or sleep.   macitentan 10 MG tablet Commonly known as: Opsumit Take 1 tablet (10 mg total) by mouth daily.   midodrine 5 MG tablet Commonly known as: PROAMATINE Take 1 tablet (5 mg total) by mouth 3 (three) times daily with meals.   mirtazapine 15 MG tablet Commonly known as: REMERON Take 15 mg by mouth at bedtime.   rosuvastatin 40 MG tablet Commonly known as: CRESTOR Take 1 tablet (40 mg total) by mouth daily. Please make overdue appt with Dr. Irish Lack before anymore refills. 2nd attempt   sildenafil 20 MG tablet Commonly known as: REVATIO Take 60 mg by mouth 3 (three) times daily.   tadalafil 20 MG tablet Commonly known as: CIALIS Take 40 mg by mouth daily.   Trelegy Ellipta 100-62.5-25 MCG/INH Aepb Generic drug: Fluticasone-Umeclidin-Vilant Inhale 1 puff into the lungs daily.   triamcinolone cream 0.1 % Commonly known as: KENALOG Apply 1 application topically daily as needed for irritation.   Tubersol 5 UNIT/0.1ML injection Generic drug: tuberculin Inject into the skin once. Once A Day Every 14 Days   Uptravi 200 MCG Tabs Generic drug: Selexipag Take 400-600 mcg by mouth 2 (two) times daily. Take 2 tablets in the morning and 3 tablets at  night   Vitaline  CoQ10 100 MG Wafr Generic drug: Coenzyme Q10 Take 60 mg by mouth daily.   zinc oxide 20 % ointment Apply 1 application topically. To buttocks after every incontinent episode and as needed for redness. May keep at bedside. As Needed       Review of Systems  Constitutional: Positive for activity change and appetite change.  HENT: Negative.   Respiratory: Positive for shortness of breath.   Cardiovascular: Positive for leg swelling.  Gastrointestinal: Negative.   Genitourinary: Negative.   Musculoskeletal: Negative.   Neurological: Positive for weakness.  Psychiatric/Behavioral: Positive for dysphoric mood. The patient is nervous/anxious.     Vitals:   08/11/19 1022  BP: 95/65  Pulse: 74  Resp: 20  Temp: (!) 97.4 F (36.3 C)  SpO2: (!) 88%  Weight: 150 lb 8 oz (68.3 kg)  Height: _0  (1.803 m)   Body mass index is 20.99 kg/m. Physical Exam Vitals reviewed.  Constitutional:      Appearance: Normal appearance.  HENT:     Head: Normocephalic.     Nose: Nose normal.     Mouth/Throat:     Mouth: Mucous membranes are moist.     Pharynx: Oropharynx is clear.  Eyes:     Pupils: Pupils are equal, round, and reactive to light.  Cardiovascular:     Rate and Rhythm: Normal rate and regular rhythm.     Heart sounds: Murmur present.  Pulmonary:     Effort: Pulmonary effort is normal. No respiratory distress.     Breath sounds: Normal breath sounds. No wheezing or rales.  Abdominal:     General: Abdomen is flat. Bowel sounds are normal. There is no distension.     Palpations: Abdomen is soft.     Tenderness: There is no abdominal tenderness. There is no guarding.  Musculoskeletal:        General: Swelling present.     Cervical back: Neck supple.  Skin:    General: Skin is warm.  Neurological:     General: No focal deficit present.     Mental Status: He is alert and oriented to person, place, and time.     Comments: Is not able to move right side due to his Femoral  Fracture and Pain  Psychiatric:     Comments: Seems to be depressed and Anxious     Labs reviewed: Basic Metabolic Panel: Recent Labs    08/08/19 0245 08/08/19 0609 08/09/19 0319 08/10/19 0258  NA 136 135 136 136  K 5.6* 5.4* 5.7* 5.4*  CL 101  --  102 101  CO2 18*  --  18* 21*  GLUCOSE 157*  --  105* 97  BUN 47*  --  58* 74*  CREATININE 2.09*  --  2.17* 2.51*  CALCIUM 8.6*  --  8.6* 8.4*   Liver Function Tests: Recent Labs    08/19/18 0936 08/08/19 0245  AST 27 64*  ALT 16 32  ALKPHOS 70 144*  BILITOT 1.0 2.2*  PROT 6.5 6.7  ALBUMIN 3.9 3.4*   No results for input(s): LIPASE, AMYLASE in the last 8760 hours. No results for input(s): AMMONIA in the last 8760 hours. CBC: Recent Labs    08/08/19 0245 08/08/19 0609  WBC 7.7  --   NEUTROABS 6.3  --   HGB 16.3 16.3  HCT 50.5 48.0  MCV 95.8  --   PLT 102*  --    Cardiac Enzymes: Recent Labs    08/09/19  1253  CKTOTAL 90   BNP: Invalid input(s): POCBNP No results found for: HGBA1C No results found for: TSH No results found for: VITAMINB12 No results found for: FOLATE No results found for: IRON, TIBC, FERRITIN  Imaging and Procedures obtained prior to SNF admission: CT HIP RIGHT WO CONTRAST  Result Date: 08/08/2019 CLINICAL DATA:  Right hip pain secondary to a fall. EXAM: CT OF THE RIGHT HIP WITHOUT CONTRAST TECHNIQUE: Multidetector CT imaging of the right hip was performed according to the standard protocol. Multiplanar CT image reconstructions were also generated. COMPARISON:  Radiographs dated 08/08/2019 FINDINGS: Bones/Joint/Cartilage There is a very subtle subcapital fracture of the right femoral neck with no displacement. The visualized pelvic bones are intact. Muscles and Tendons Normal. Soft tissues Extensive sigmoid diverticulosis. Slight edema in the subcutaneous fat of the buttocks and proximal right femur, indeterminate. IMPRESSION: Subtle subcapital fracture of the right femoral neck.  Electronically Signed   By: Lorriane Shire M.D.   On: 08/08/2019 10:01   DG Chest Port 1 View  Result Date: 08/08/2019 CLINICAL DATA:  Shortness of breath. EXAM: PORTABLE CHEST 1 VIEW COMPARISON:  04/28/2017 FINDINGS: Cardiomegaly, new from prior exam. There is a globular configuration of the cardiopericardial silhouette. Atherosclerosis of the thoracic aorta. No focal airspace disease. No evidence of pulmonary edema. Mild emphysema, apical predominant. No pleural effusion or pneumothorax. No acute osseous abnormalities are seen. IMPRESSION: 1. Cardiomegaly, progressed from 2018. Slightly globular configuration of the cardiopericardial silhouette, which can be seen with underlying pericardial effusion. Consider echocardiogram. 2. Emphysema. Aortic Atherosclerosis (ICD10-I70.0) and Emphysema (ICD10-J43.9). Electronically Signed   By: Keith Rake M.D.   On: 08/08/2019 02:55   ECHOCARDIOGRAM COMPLETE  Result Date: 08/08/2019   ECHOCARDIOGRAM REPORT   Patient Name:   [name] Date of Exam: 08/08/2019 Medical Rec #:  [id number]      Height:       71.0 in Accession #:    [id number]     Weight:       165.3 lb Date of Birth:  11/04/1945      BSA:          1.94 m Patient Age:    71 years       BP:           80/63 mmHg Patient Gender: M              HR:           71 bpm. Exam Location:  Inpatient Procedure: 2D Echo Indications:    Pulmonary Embolus I26.99  History:        Patient has prior history of Echocardiogram examinations, most                 recent 05/27/2018. CAD, Pulmonary HTN; Risk Factors:Dyslipidemia.  Sonographer:    Mikki Santee RDCS (AE) Referring Phys: [place]  1. Left ventricular ejection fraction, by visual estimation, is 60 to 65%. The left ventricle has normal function. There is mildly increased left ventricular hypertrophy.  2. The left ventricle has no regional wall motion abnormalities.  3. Left ventricular diastolic parameters are consistent with Grade  II diastolic dysfunction (pseudonormalization).  4. Global right ventricle has severely reduced systolic function.The right ventricular size is severely enlarged. No increase in right ventricular wall thickness. D-shaped interventricular septum suggestive of RV pressure/volume overload.  5. The tricuspid valve is normal in structure. Tricuspid valve regurgitation is severe. Systolic flow reversal on the hepatic vein doppler pattern.  6. The aortic valve is tricuspid. Aortic valve regurgitation is not visualized. Mild aortic valve sclerosis without stenosis.  7. Left atrial size was moderately dilated.  8. Right atrial size was severely dilated.  9. The mitral valve is normal in structure. Trace mitral valve regurgitation. No evidence of mitral stenosis. 10. The inferior vena cava is dilated in size with <50% respiratory variability, suggesting right atrial pressure of 15 mmHg. 11. The tricuspid regurgitant velocity is 3.79 m/s, and with an assumed right atrial pressure of 15 mmHg, the estimated right ventricular systolic pressure is severely elevated at 72.5 mmHg. 12. Trivial pericardial effusion is present. FINDINGS  Left Ventricle: Left ventricular ejection fraction, by visual estimation, is 60 to 65%. The left ventricle has normal function. The left ventricle has no regional wall motion abnormalities. The left ventricular internal cavity size was the left ventricle is normal in size. There is mildly increased left ventricular hypertrophy. Left ventricular diastolic parameters are consistent with Grade II diastolic dysfunction (pseudonormalization). Right Ventricle: The right ventricular size is severely enlarged. No increase in right ventricular wall thickness. Global RV systolic function is has severely reduced systolic function. The tricuspid regurgitant velocity is 3.79 m/s, and with an assumed right atrial pressure of 15 mmHg, the estimated right ventricular systolic pressure is severely elevated at 72.5  mmHg. Left Atrium: Left atrial size was moderately dilated. Right Atrium: Right atrial size was severely dilated Pericardium: Trivial pericardial effusion is present. Mitral Valve: The mitral valve is normal in structure. No evidence of mitral valve stenosis by observation. Trace mitral valve regurgitation. Tricuspid Valve: The tricuspid valve is normal in structure. Tricuspid valve regurgitation is severe. Aortic Valve: The aortic valve is tricuspid. Aortic valve regurgitation is not visualized. Mild aortic valve sclerosis is present, with no evidence of aortic valve stenosis. Pulmonic Valve: The pulmonic valve was normal in structure. Pulmonic valve regurgitation is not visualized. Aorta: The aortic root is normal in size and structure. Venous: The inferior vena cava is dilated in size with less than 50% respiratory variability, suggesting right atrial pressure of 15 mmHg. IAS/Shunts: No atrial level shunt detected by color flow Doppler.  LEFT VENTRICLE PLAX 2D LVIDd:         4.16 cm  Diastology LVIDs:         2.52 cm  LV e' lateral:   7.95 cm/s LV PW:         0.96 cm  LV E/e' lateral: 5.7 LV IVS:        0.83 cm  LV e' medial:    4.95 cm/s LVOT diam:     2.50 cm  LV E/e' medial:  9.2 LV SV:         54 ml LV SV Index:   27.87 LVOT Area:     4.91 cm  RIGHT VENTRICLE RV S prime:     5.18 cm/s TAPSE (M-mode): 1.0 cm LEFT ATRIUM             Index       RIGHT ATRIUM           Index LA diam:        5.00 cm 2.57 cm/m  RA Area:     33.00 cm LA Vol (A2C):   46.2 ml 23.76 ml/m RA Volume:   125.00 ml 64.28 ml/m LA Vol (A4C):   87.0 ml 44.74 ml/m LA Biplane Vol: 69.2 ml 35.58 ml/m  AORTIC VALVE LVOT Vmax:   58.10 cm/s LVOT Vmean:  35.200 cm/s  LVOT VTI:    0.110 m  AORTA Ao Root diam: 3.50 cm MITRAL VALVE                        TRICUSPID VALVE MV Area (PHT): 4.31 cm             TR Peak grad:   57.5 mmHg MV PHT:        51.04 msec           TR Vmax:        379.00 cm/s MV Decel Time: 176 msec MV E velocity: 45.40 cm/s 103  cm/s  SHUNTS MV A velocity: 42.80 cm/s 70.3 cm/s Systemic VTI:  0.11 m MV E/A ratio:  1.06       1.5       Systemic Diam: 2.50 cm  Loralie Champagne MD Electronically signed by Loralie Champagne MD Signature Date/Time: 08/08/2019/3:36:21 PM    Final    DG Hip Unilat W or Wo Pelvis 2-3 Views Right  Result Date: 08/08/2019 CLINICAL DATA:  Fall with right hip pain EXAM: DG HIP (WITH OR WITHOUT PELVIS) 2-3V RIGHT COMPARISON:  None. FINDINGS: Limited assessment of the right femoral neck due to inward rotation. No definite fracture, although there is subtle cortical overlapping at the lateral femoral neck. No dislocation. Osteopenia. IMPRESSION: Subtle cortical irregularity at the lateral femoral neck, not definite for fracture. Depending on suspicion for fracture a CT or MRI could be obtained. Electronically Signed   By: Monte Fantasia M.D.   On: 08/08/2019 07:57   VAS [place] LOWER EXTREMITY VENOUS (DVT)  Result Date: 08/08/2019  Lower Venous Study Indications: Edema.  Comparison Study: no prior Performing Technologist: Abram Sander RVS  Examination Guidelines: A complete evaluation includes B-mode imaging, spectral Doppler, color Doppler, and power Doppler as needed of all accessible portions of each vessel. Bilateral testing is considered an integral part of a complete examination. Limited examinations for reoccurring indications may be performed as noted.  +---------+---------------+---------+-----------+----------+--------------+ RIGHT    CompressibilityPhasicitySpontaneityPropertiesThrombus Aging +---------+---------------+---------+-----------+----------+--------------+ CFV      Full           Yes      Yes                                 +---------+---------------+---------+-----------+----------+--------------+ SFJ      Full                                                        +---------+---------------+---------+-----------+----------+--------------+ FV Prox  Full                                                         +---------+---------------+---------+-----------+----------+--------------+ FV Mid   Full                                                        +---------+---------------+---------+-----------+----------+--------------+ FV DistalFull                                                        +---------+---------------+---------+-----------+----------+--------------+  PFV      Full                                                        +---------+---------------+---------+-----------+----------+--------------+ POP      Full           Yes      Yes                                 +---------+---------------+---------+-----------+----------+--------------+ PTV      Full                                                        +---------+---------------+---------+-----------+----------+--------------+ PERO                                                  Not visualized +---------+---------------+---------+-----------+----------+--------------+   +---------+---------------+---------+-----------+----------+--------------+ LEFT     CompressibilityPhasicitySpontaneityPropertiesThrombus Aging +---------+---------------+---------+-----------+----------+--------------+ CFV      Full           Yes      Yes                                 +---------+---------------+---------+-----------+----------+--------------+ SFJ      Full                                                        +---------+---------------+---------+-----------+----------+--------------+ FV Prox  Full                                                        +---------+---------------+---------+-----------+----------+--------------+ FV Mid   Full                                                        +---------+---------------+---------+-----------+----------+--------------+ FV DistalFull                                                         +---------+---------------+---------+-----------+----------+--------------+ PFV      Full                                                        +---------+---------------+---------+-----------+----------+--------------+  POP      Full           Yes      Yes                                 +---------+---------------+---------+-----------+----------+--------------+ PTV      Full                                                        +---------+---------------+---------+-----------+----------+--------------+ PERO                                                  Not visualized +---------+---------------+---------+-----------+----------+--------------+     Summary: Right: There is no evidence of deep vein thrombosis in the lower extremity. No cystic structure found in the popliteal fossa. Left: There is no evidence of deep vein thrombosis in the lower extremity. No cystic structure found in the popliteal fossa.  *See table(s) above for measurements and observations. Electronically signed by Harold Barban MD on 08/08/2019 at 5:28:13 PM.    Final     Assessment/Plan Pulmonary hypertension ([place]) with Cor pulmonale On 5 lit of oxygen right now Hard to do Pox on him due to his raynaud's Disease Will continue to monitor Clinically On opsumit Tidafil and Uptravi D/W the Wife and Discontinued Revatio She will also call [facility] cordinating Nurse for further Follow up  Hypotension On midodrine now Doing better Acute on chronic renal insufficiency This is Worrisome Lasix were restarted but it seems from his old records he is on Demadex His weight is stable right now Aldactone in hold Potassium on hold Awaiting Repeat BMP in facility  COPD On Trelegy Right Chronic CHF His weight is at baseline Lasix was restarted Aldactone on hold for npow BNP in the hospital was elevated Will switch to demadex if his Renal function improve S/P Subcapital right Femur fracture NWbearing right now  Pain seems controlled Follow up with Ortho in 2 weeks Will repeat Xray in the facility per Wife request  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  Discontinue Ativan  Mixed hyperlipidemia On Crestor      Family/ staff Communication:   Labs/tests ordered: Total time spent in this patient care encounter was  45_  minutes; greater than 50% of the visit spent counseling patient and staff, reviewing records , Labs and coordinating care for problems addressed at this encounter.

## 2019-08-13 LAB — CULTURE, BLOOD (ROUTINE X 2)
Culture: NO GROWTH
Culture: NO GROWTH
Special Requests: ADEQUATE
Special Requests: ADEQUATE

## 2019-08-14 ENCOUNTER — Non-Acute Institutional Stay (SKILLED_NURSING_FACILITY): Payer: Medicare Other | Admitting: Internal Medicine

## 2019-08-14 ENCOUNTER — Encounter: Payer: Self-pay | Admitting: Internal Medicine

## 2019-08-14 DIAGNOSIS — J9611 Chronic respiratory failure with hypoxia: Secondary | ICD-10-CM

## 2019-08-14 DIAGNOSIS — I2781 Cor pulmonale (chronic): Secondary | ICD-10-CM | POA: Diagnosis not present

## 2019-08-14 DIAGNOSIS — I7300 Raynaud's syndrome without gangrene: Secondary | ICD-10-CM

## 2019-08-14 DIAGNOSIS — I272 Pulmonary hypertension, unspecified: Secondary | ICD-10-CM

## 2019-08-14 DIAGNOSIS — I5031 Acute diastolic (congestive) heart failure: Secondary | ICD-10-CM | POA: Diagnosis not present

## 2019-08-14 DIAGNOSIS — E782 Mixed hyperlipidemia: Secondary | ICD-10-CM

## 2019-08-14 DIAGNOSIS — N189 Chronic kidney disease, unspecified: Secondary | ICD-10-CM

## 2019-08-14 DIAGNOSIS — N289 Disorder of kidney and ureter, unspecified: Secondary | ICD-10-CM | POA: Diagnosis not present

## 2019-08-14 NOTE — Progress Notes (Signed)
Location:   [facility] Room Number: [place] of Service:  SNF [phone]) Provider:  Veleta Miners MD  Aaron Cruel, MD  Patient Care Team: Aaron Cruel, MD as PCP - General [facility] Medicine)  Extended Emergency Contact Information Primary Emergency Contact: Howard,Aaron Address: [address]          [place], [place] [address] [name] of [place] Phone: [phone] Relation: Spouse  Code Status:  Full Code Goals of care: Advanced Directive information Advanced Directives 08/11/2019  Does Patient Have a Medical Advance Directive? No  Type of Advance Directive -  Does patient want to make changes to medical advance directive? -  Copy of [place] in Chart? -  Would patient like information on creating a medical advance directive? No - Patient declined  Pre-existing out of facility DNR order (yellow form or pink MOST form) -     Chief Complaint  Patient presents with  . Acute Visit    Follow up of renal function    HPI:  Pt is a 73 y.o. male seen today for an acute visit for Acute renal Insufficiency .  Patient was admitted in the hospital from 12/8 -12/10  for acute on chronic respiratory distress, pulmonary hypertension, worsening CKD and nondisplaced right Subcapital femur fracture Patient has a history of severe pulmonary hypertension with cor pulmonale.  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 He tripped and fell at home and had a right hip pain.  He also had some worsening of his shortness of breath.  He was sent to ED for further evaluation.  In ED he was found to have subtle right subcapital femur fracture.  But as he is high risk for significantly patient it was decided to be managed conservatively. He also had acute renal insufficiency.  He was treated with gentle diuresis as his BNP was elevated  too.  He also was started on doxycycline His echo showed EF of 60 to 65% but severely reduced right systolic function with severe TR Also had Dopplers of lower extremity which were negative for Thrombus  Since his Diuretics was started in the hospital he has had worsening renal function with BUN of 81 and Creat of 2.44. I have discontinued his Lasix right now He still has poor Appetite though he is drinking Ok His weight is down 3 lbs. Denies any SOB but does have Cough. It is hard to get POX reading on him due to his Raynaud's. But he is comfortable and in No Acute Distress. Pain is Bearable in LE  Past Medical History:  Diagnosis Date  . CAD (coronary artery disease)   . GAD (generalized anxiety disorder)   . Hyperlipidemia   . Overweight(278.02)   . Pulmonary hypertension ([place])    Past Surgical History:  Procedure Laterality Date  . ANAL FISSURE REPAIR    . CARDIAC CATHETERIZATION  2005  . RIGHT/LEFT HEART CATH AND CORONARY ANGIOGRAPHY N/A 11/24/2016   Procedure: Right/Left Heart Cath and Coronary Angiography;  Surgeon: Jettie Booze, MD;  Location: [facility];  Service: Cardiovascular;  Laterality: N/A;  . SKIN CANCER EXCISION    . TONSILLECTOMY  1955  . UMBILICAL HERNIA REPAIR  10/07/2011   Procedure: HERNIA REPAIR UMBILICAL ADULT;  Surgeon: Rolm Bookbinder, MD;  Location: [facility];  Service: General;  Laterality: N/A;  with mesh     Allergies  Allergen Reactions  . Penicillins Hives    Has patient had a PCN reaction causing immediate rash, facial/tongue/throat swelling, SOB or lightheadedness with hypotension: No Has patient had a PCN reaction causing severe rash involving mucus membranes or skin necrosis: Yes Has patient had a PCN reaction that required hospitalization Unknown Has patient had a PCN reaction occurring within the last 10 years: No If all of the above answers are "NO", then may proceed with Cephalosporin use.   . Nitrates, Organic   . Symbicort  [Budesonide-Formoterol Fumarate] Other (See Comments) and Cough    Severe cough    Allergies as of 08/14/2019      Reactions   Penicillins Hives   Has patient had a PCN reaction causing immediate rash, facial/tongue/throat swelling, SOB or lightheadedness with hypotension: No Has patient had a PCN reaction causing severe rash involving mucus membranes or skin necrosis: Yes Has patient had a PCN reaction that required hospitalization Unknown Has patient had a PCN reaction occurring within the last 10 years: No If all of the above answers are "NO", then may proceed with Cephalosporin use.   Nitrates, Organic    Symbicort [budesonide-formoterol Fumarate] Other (See Comments), Cough   Severe cough      Medication List       Accurate as of August 14, 2019 11:08 AM. If you have any questions, ask your nurse or doctor.        STOP taking these medications   furosemide 20 MG tablet Commonly known as: LASIX Stopped by: Virgie Dad, MD   LORazepam 0.5 MG tablet Commonly known as: ATIVAN Stopped by: Virgie Dad, MD   sildenafil 20 MG tablet Commonly known as: REVATIO Stopped by: Virgie Dad, MD     TAKE these medications   ACIDOPHILUS PO Take 1 capsule by mouth daily.   ammonium lactate 12 % cream Commonly known as: AMLACTIN Apply 1 application topically daily as needed for dry skin.   aspirin 81 MG tablet Take 81 mg by mouth every evening.   Azelastine-Fluticasone 137-50 MCG/ACT Susp Commonly known as: Dymista Place 1-2 sprays into the nose daily as needed. What changed: reasons to take this   CENTRUM CARDIO PO Take 1 tablet by mouth daily.   clonazePAM 0.5 MG tablet Commonly known as: KLONOPIN Take 1 tablet (0.5 mg total) by mouth 3 (three) times daily as needed for up to 15 doses for anxiety.   doxycycline 100 MG tablet Commonly known as: VIBRA-TABS Take 1 tablet (100 mg total) by mouth every 12 (twelve) hours for 4 days.   DULoxetine 60 MG  capsule Commonly known as: CYMBALTA Take 60 mg by mouth daily.   ipratropium 0.06 % nasal spray Commonly known as: ATROVENT Place 2 sprays into both nostrils 4 (four) times daily as needed for rhinitis.   macitentan 10 MG tablet Commonly known as: Opsumit Take 1 tablet (10 mg total) by mouth daily.   midodrine 5 MG tablet Commonly known as: PROAMATINE Take 1 tablet (5 mg total) by mouth 3 (three) times daily with meals.   mirtazapine 15 MG tablet Commonly known as: REMERON Take 15 mg by mouth at bedtime.   rosuvastatin 40 MG tablet Commonly known as: CRESTOR Take 1 tablet (40 mg total) by mouth daily. Please make overdue appt with Dr. Irish Lack before anymore refills. 2nd attempt   tadalafil 20 MG tablet Commonly known as: CIALIS Take 40 mg by mouth daily.  Trelegy Ellipta 100-62.5-25 MCG/INH Aepb Generic drug: Fluticasone-Umeclidin-Vilant Inhale 1 puff into the lungs daily.   triamcinolone cream 0.1 % Commonly known as: KENALOG Apply 1 application topically daily as needed for irritation.   Tubersol 5 UNIT/0.1ML injection Generic drug: tuberculin Inject into the skin once. Once A Day Every 14 Days   Uptravi 200 MCG Tabs Generic drug: Selexipag Take 400-600 mcg by mouth 2 (two) times daily. Take 2 tablets in the morning and 3 tablets at night   Vitaline CoQ10 100 MG Wafr Generic drug: Coenzyme Q10 Take 60 mg by mouth daily.   zinc oxide 20 % ointment Apply 1 application topically. To buttocks after every incontinent episode and as needed for redness. May keep at bedside. As Needed       Review of Systems  Constitutional: Positive for activity change and appetite change.  HENT: Negative.   Respiratory: Positive for cough.   Cardiovascular: Positive for leg swelling.  Gastrointestinal: Negative.   Genitourinary: Negative.   Musculoskeletal: Negative.   Skin: Negative.   Neurological: Positive for weakness.  Psychiatric/Behavioral: Positive for dysphoric  mood.    Immunization History  Administered Date(s) Administered  . Fluad Quad(high Dose 65+) 06/14/2019  . Influenza, High Dose Seasonal PF 05/29/2015, 05/05/2017  . Influenza,inj,quad, With Preservative 04/23/2016  . Influenza-Unspecified 05/29/2015, 05/22/2017, 06/08/2018  . Pneumococcal Conjugate-13 02/05/2014  . Pneumococcal Polysaccharide-23 01/29/2012  . Tdap 01/29/2012  . Zoster 08/31/2012   Pertinent  Health Maintenance Due  Topic Date Due  . COLONOSCOPY  10/17/1995  . INFLUENZA VACCINE  Completed  . PNA vac Low Risk Adult  Completed   Fall Risk  03/19/2017  Falls in the past year? No   Functional Status Survey:    Vitals:   08/14/19 1058  BP: (!) 82/61  Pulse: 98  Resp: (!) 24  Temp: 98 F (36.7 C)  SpO2: (!) 88%  Weight: 150 lb 8 oz (68.3 kg)  Height: _0  (1.803 m)   Body mass index is 20.99 kg/m. Physical Exam Vitals reviewed.  Constitutional:      Appearance: Normal appearance.  HENT:     Head: Normocephalic.     Nose: Nose normal.     Mouth/Throat:     Mouth: Mucous membranes are moist.     Pharynx: Oropharynx is clear.  Eyes:     Pupils: Pupils are equal, round, and reactive to light.  Cardiovascular:     Rate and Rhythm: Normal rate and regular rhythm.     Pulses: Normal pulses.     Heart sounds: Murmur present.  Pulmonary:     Effort: Pulmonary effort is normal. No respiratory distress.     Breath sounds: Normal breath sounds. No wheezing or rales.  Abdominal:     General: Abdomen is flat. Bowel sounds are normal. There is no distension.     Palpations: Abdomen is soft.     Tenderness: There is no abdominal tenderness. There is no guarding.  Musculoskeletal:     Cervical back: Neck supple.     Comments: Mild Swelling Bilateral  Skin:    General: Skin is warm.     Comments: Has Cold Hand with Blue Lips and Fingers  Neurological:     General: No focal deficit present.     Mental Status: He is alert and oriented to person, place,  and time.  Psychiatric:        Mood and Affect: Mood normal.     Labs reviewed: Recent Labs  08/08/19 0245 08/08/19 0609 08/09/19 0319 08/10/19 0258  NA 136 135 136 136  K 5.6* 5.4* 5.7* 5.4*  CL 101  --  102 101  CO2 18*  --  18* 21*  GLUCOSE 157*  --  105* 97  BUN 47*  --  58* 74*  CREATININE 2.09*  --  2.17* 2.51*  CALCIUM 8.6*  --  8.6* 8.4*   Recent Labs    08/19/18 0936 08/08/19 0245  AST 27 64*  ALT 16 32  ALKPHOS 70 144*  BILITOT 1.0 2.2*  PROT 6.5 6.7  ALBUMIN 3.9 3.4*   Recent Labs    08/08/19 0245 08/08/19 0609  WBC 7.7  --   NEUTROABS 6.3  --   HGB 16.3 16.3  HCT 50.5 48.0  MCV 95.8  --   PLT 102*  --    No results found for: TSH No results found for: HGBA1C Lab Results  Component Value Date   CHOL 101 06/12/2016   HDL 37 (L) 06/12/2016   LDLCALC 47 06/12/2016   TRIG 86 06/12/2016   CHOLHDL 2.7 06/12/2016    Significant Diagnostic Results in last 30 days:  CT HEAD WO CONTRAST  Result Date: 08/10/2019 CLINICAL DATA:  Syncope. Fall. EXAM: CT HEAD WITHOUT CONTRAST TECHNIQUE: Contiguous axial images were obtained from the base of the skull through the vertex without intravenous contrast. COMPARISON:  None. FINDINGS: Brain: Mild cortical atrophy. Negative for hydrocephalus. Negative for acute infarct, hemorrhage, mass. Vascular: Negative for hyperdense vessel Skull: Negative Sinuses/Orbits: Negative Other: None IMPRESSION: Mild atrophy. No acute abnormality. Electronically Signed   By: Franchot Gallo M.D.   On: 08/10/2019 10:20   CT HIP RIGHT WO CONTRAST  Result Date: 08/08/2019 CLINICAL DATA:  Right hip pain secondary to a fall. EXAM: CT OF THE RIGHT HIP WITHOUT CONTRAST TECHNIQUE: Multidetector CT imaging of the right hip was performed according to the standard protocol. Multiplanar CT image reconstructions were also generated. COMPARISON:  Radiographs dated 08/08/2019 FINDINGS: Bones/Joint/Cartilage There is a very subtle subcapital fracture  of the right femoral neck with no displacement. The visualized pelvic bones are intact. Muscles and Tendons Normal. Soft tissues Extensive sigmoid diverticulosis. Slight edema in the subcutaneous fat of the buttocks and proximal right femur, indeterminate. IMPRESSION: Subtle subcapital fracture of the right femoral neck. Electronically Signed   By: Lorriane Shire M.D.   On: 08/08/2019 10:01   DG Chest Port 1 View  Result Date: 08/08/2019 CLINICAL DATA:  Shortness of breath. EXAM: PORTABLE CHEST 1 VIEW COMPARISON:  04/28/2017 FINDINGS: Cardiomegaly, new from prior exam. There is a globular configuration of the cardiopericardial silhouette. Atherosclerosis of the thoracic aorta. No focal airspace disease. No evidence of pulmonary edema. Mild emphysema, apical predominant. No pleural effusion or pneumothorax. No acute osseous abnormalities are seen. IMPRESSION: 1. Cardiomegaly, progressed from 2018. Slightly globular configuration of the cardiopericardial silhouette, which can be seen with underlying pericardial effusion. Consider echocardiogram. 2. Emphysema. Aortic Atherosclerosis (ICD10-I70.0) and Emphysema (ICD10-J43.9). Electronically Signed   By: Keith Rake M.D.   On: 08/08/2019 02:55   ECHOCARDIOGRAM COMPLETE  Result Date: 08/08/2019   ECHOCARDIOGRAM REPORT   Patient Name:   [name] Date of Exam: 08/08/2019 Medical Rec #:  [id number]      Height:       71.0 in Accession #:    [id number]     Weight:       165.3 lb Date of Birth:  [birth year]      BSA:  1.94 m Patient Age:    68 years       BP:           80/63 mmHg Patient Gender: M              HR:           71 bpm. Exam Location:  Inpatient Procedure: 2D Echo Indications:    Pulmonary Embolus I26.99  History:        Patient has prior history of Echocardiogram examinations, most                 recent 05/27/2018. CAD, Pulmonary HTN; Risk Factors:Dyslipidemia.  Sonographer:    Mikki Santee RDCS (AE) Referring Phys: [place]  1. Left ventricular ejection fraction, by visual estimation, is 60 to 65%. The left ventricle has normal function. There is mildly increased left ventricular hypertrophy.  2. The left ventricle has no regional wall motion abnormalities.  3. Left ventricular diastolic parameters are consistent with Grade II diastolic dysfunction (pseudonormalization).  4. Global right ventricle has severely reduced systolic function.The right ventricular size is severely enlarged. No increase in right ventricular wall thickness. D-shaped interventricular septum suggestive of RV pressure/volume overload.  5. The tricuspid valve is normal in structure. Tricuspid valve regurgitation is severe. Systolic flow reversal on the hepatic vein doppler pattern.  6. The aortic valve is tricuspid. Aortic valve regurgitation is not visualized. Mild aortic valve sclerosis without stenosis.  7. Left atrial size was moderately dilated.  8. Right atrial size was severely dilated.  9. The mitral valve is normal in structure. Trace mitral valve regurgitation. No evidence of mitral stenosis. 10. The inferior vena cava is dilated in size with <50% respiratory variability, suggesting right atrial pressure of 15 mmHg. 11. The tricuspid regurgitant velocity is 3.79 m/s, and with an assumed right atrial pressure of 15 mmHg, the estimated right ventricular systolic pressure is severely elevated at 72.5 mmHg. 12. Trivial pericardial effusion is present. FINDINGS  Left Ventricle: Left ventricular ejection fraction, by visual estimation, is 60 to 65%. The left ventricle has normal function. The left ventricle has no regional wall motion abnormalities. The left ventricular internal cavity size was the left ventricle is normal in size. There is mildly increased left ventricular hypertrophy. Left ventricular diastolic parameters are consistent with Grade II diastolic dysfunction (pseudonormalization). Right Ventricle: The right ventricular size  is severely enlarged. No increase in right ventricular wall thickness. Global RV systolic function is has severely reduced systolic function. The tricuspid regurgitant velocity is 3.79 m/s, and with an assumed right atrial pressure of 15 mmHg, the estimated right ventricular systolic pressure is severely elevated at 72.5 mmHg. Left Atrium: Left atrial size was moderately dilated. Right Atrium: Right atrial size was severely dilated Pericardium: Trivial pericardial effusion is present. Mitral Valve: The mitral valve is normal in structure. No evidence of mitral valve stenosis by observation. Trace mitral valve regurgitation. Tricuspid Valve: The tricuspid valve is normal in structure. Tricuspid valve regurgitation is severe. Aortic Valve: The aortic valve is tricuspid. Aortic valve regurgitation is not visualized. Mild aortic valve sclerosis is present, with no evidence of aortic valve stenosis. Pulmonic Valve: The pulmonic valve was normal in structure. Pulmonic valve regurgitation is not visualized. Aorta: The aortic root is normal in size and structure. Venous: The inferior vena cava is dilated in size with less than 50% respiratory variability, suggesting right atrial pressure of 15 mmHg. IAS/Shunts: No atrial level shunt detected by color flow  Doppler.  LEFT VENTRICLE PLAX 2D LVIDd:         4.16 cm  Diastology LVIDs:         2.52 cm  LV e' lateral:   7.95 cm/s LV PW:         0.96 cm  LV E/e' lateral: 5.7 LV IVS:        0.83 cm  LV e' medial:    4.95 cm/s LVOT diam:     2.50 cm  LV E/e' medial:  9.2 LV SV:         54 ml LV SV Index:   27.87 LVOT Area:     4.91 cm  RIGHT VENTRICLE RV S prime:     5.18 cm/s TAPSE (M-mode): 1.0 cm LEFT ATRIUM             Index       RIGHT ATRIUM           Index LA diam:        5.00 cm 2.57 cm/m  RA Area:     33.00 cm LA Vol (A2C):   46.2 ml 23.76 ml/m RA Volume:   125.00 ml 64.28 ml/m LA Vol (A4C):   87.0 ml 44.74 ml/m LA Biplane Vol: 69.2 ml 35.58 ml/m  AORTIC VALVE LVOT  Vmax:   58.10 cm/s LVOT Vmean:  35.200 cm/s LVOT VTI:    0.110 m  AORTA Ao Root diam: 3.50 cm MITRAL VALVE                        TRICUSPID VALVE MV Area (PHT): 4.31 cm             TR Peak grad:   57.5 mmHg MV PHT:        51.04 msec           TR Vmax:        379.00 cm/s MV Decel Time: 176 msec MV E velocity: 45.40 cm/s 103 cm/s  SHUNTS MV A velocity: 42.80 cm/s 70.3 cm/s Systemic VTI:  0.11 m MV E/A ratio:  1.06       1.5       Systemic Diam: 2.50 cm  Loralie Champagne MD Electronically signed by Loralie Champagne MD Signature Date/Time: 08/08/2019/3:36:21 PM    Final    DG Hip Unilat W or Wo Pelvis 2-3 Views Right  Result Date: 08/08/2019 CLINICAL DATA:  Fall with right hip pain EXAM: DG HIP (WITH OR WITHOUT PELVIS) 2-3V RIGHT COMPARISON:  None. FINDINGS: Limited assessment of the right femoral neck due to inward rotation. No definite fracture, although there is subtle cortical overlapping at the lateral femoral neck. No dislocation. Osteopenia. IMPRESSION: Subtle cortical irregularity at the lateral femoral neck, not definite for fracture. Depending on suspicion for fracture a CT or MRI could be obtained. Electronically Signed   By: Monte Fantasia M.D.   On: 08/08/2019 07:57   VAS [place] LOWER EXTREMITY VENOUS (DVT)  Result Date: 08/08/2019  Lower Venous Study Indications: Edema.  Comparison Study: no prior Performing Technologist: Abram Sander RVS  Examination Guidelines: A complete evaluation includes B-mode imaging, spectral Doppler, color Doppler, and power Doppler as needed of all accessible portions of each vessel. Bilateral testing is considered an integral part of a complete examination. Limited examinations for reoccurring indications may be performed as noted.  +---------+---------------+---------+-----------+----------+--------------+ RIGHT    CompressibilityPhasicitySpontaneityPropertiesThrombus Aging +---------+---------------+---------+-----------+----------+--------------+ CFV      Full  Yes      Yes                                 +---------+---------------+---------+-----------+----------+--------------+ SFJ      Full                                                        +---------+---------------+---------+-----------+----------+--------------+ FV Prox  Full                                                        +---------+---------------+---------+-----------+----------+--------------+ FV Mid   Full                                                        +---------+---------------+---------+-----------+----------+--------------+ FV DistalFull                                                        +---------+---------------+---------+-----------+----------+--------------+ PFV      Full                                                        +---------+---------------+---------+-----------+----------+--------------+ POP      Full           Yes      Yes                                 +---------+---------------+---------+-----------+----------+--------------+ PTV      Full                                                        +---------+---------------+---------+-----------+----------+--------------+ PERO                                                  Not visualized +---------+---------------+---------+-----------+----------+--------------+   +---------+---------------+---------+-----------+----------+--------------+ LEFT     CompressibilityPhasicitySpontaneityPropertiesThrombus Aging +---------+---------------+---------+-----------+----------+--------------+ CFV      Full           Yes      Yes                                 +---------+---------------+---------+-----------+----------+--------------+ SFJ      Full                                                        +---------+---------------+---------+-----------+----------+--------------+  FV Prox  Full                                                         +---------+---------------+---------+-----------+----------+--------------+ FV Mid   Full                                                        +---------+---------------+---------+-----------+----------+--------------+ FV DistalFull                                                        +---------+---------------+---------+-----------+----------+--------------+ PFV      Full                                                        +---------+---------------+---------+-----------+----------+--------------+ POP      Full           Yes      Yes                                 +---------+---------------+---------+-----------+----------+--------------+ PTV      Full                                                        +---------+---------------+---------+-----------+----------+--------------+ PERO                                                  Not visualized +---------+---------------+---------+-----------+----------+--------------+     Summary: Right: There is no evidence of deep vein thrombosis in the lower extremity. No cystic structure found in the popliteal fossa. Left: There is no evidence of deep vein thrombosis in the lower extremity. No cystic structure found in the popliteal fossa.  *See table(s) above for measurements and observations. Electronically signed by Harold Barban MD on 08/08/2019 at 5:28:13 PM.    Final     Assessment/Plan  Acute on chronic renal insufficiency BUN and Creat keep going up Have discontinued Lasix Awaiting BMP from today Addendum BUN and Creat today show 99/1.99 Will start him on Iv fluids.  Repeat Bmp after he gets 2 litres I have d/w the Patient and his wife in detail about his condition She wants to try this instead of sending him to ED  Pulmonary hypertension ([place]) On 5 litres of Oxygen more as Nurses unable to register POX. 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  Discontinue Ativan  Mixed hyperlipidemia On Crestor   Family/ staff Communication:   Labs/tests ordered:    Total time spent in this patient care encounter was  45_  minutes; greater than 50% of the visit spent counseling patient and staff, reviewing records , Labs and coordinating care for problems addressed at this encounter.

## 2019-08-15 ENCOUNTER — Encounter: Payer: Self-pay | Admitting: Nurse Practitioner

## 2019-08-15 ENCOUNTER — Non-Acute Institutional Stay (SKILLED_NURSING_FACILITY): Payer: Medicare Other | Admitting: Nurse Practitioner

## 2019-08-15 DIAGNOSIS — I959 Hypotension, unspecified: Secondary | ICD-10-CM | POA: Diagnosis not present

## 2019-08-15 DIAGNOSIS — I272 Pulmonary hypertension, unspecified: Secondary | ICD-10-CM | POA: Diagnosis not present

## 2019-08-15 DIAGNOSIS — N189 Chronic kidney disease, unspecified: Secondary | ICD-10-CM

## 2019-08-15 DIAGNOSIS — N179 Acute kidney failure, unspecified: Secondary | ICD-10-CM | POA: Diagnosis not present

## 2019-08-15 DIAGNOSIS — I5033 Acute on chronic diastolic (congestive) heart failure: Secondary | ICD-10-CM

## 2019-08-15 DIAGNOSIS — F419 Anxiety disorder, unspecified: Secondary | ICD-10-CM

## 2019-08-15 DIAGNOSIS — J432 Centrilobular emphysema: Secondary | ICD-10-CM

## 2019-08-15 NOTE — Assessment & Plan Note (Signed)
O2, Trelegy Ellipta qd

## 2019-08-15 NOTE — Progress Notes (Signed)
Location:   [place] Room Number: 31 Place of Service:  SNF (31) Provider:  Sharada Albornoz NP  Aaron Cruel, MD  Patient Care Team: Aaron Cruel, MD as PCP - General [facility])  Extended Emergency Contact Information Primary Emergency Contact: [name] Address: [address]          [place], [place] [address] [name] of [place] Phone: [phone] Relation: Spouse  Code Status:  Full Code Goals of care: Advanced Directive information Advanced Directives 08/11/2019  Does Patient Have a Medical Advance Directive? No  Type of Advance Directive -  Does patient want to make changes to medical advance directive? -  Copy of National in Chart? -  Would patient like information on creating a medical advance directive? No - Patient declined  Pre-existing out of facility DNR order (yellow form or pink MOST form) -     Chief Complaint  Patient presents with  . Acute Visit    IVF    HPI:  Pt is a 73 y.o. male seen today for an acute visit for   Acute on CKD, Bun/creat 90/1.99 08/14/19, 74/2.51 08/10/19, 58/2.17 08/09/19. IVF I litre, off Furosemide.   Hypotension, taking Midodrine 14m tid. Bp 100/67 today  Pulmonary Hypertension, taking Uptravi 4073m am, 60075mpm. Opsumit 36m79m. Cialis 40mg76m O2 5lpm. [facility], elevated BNP, Furosemide on hold.   Anxiety, Mirtazapine 15mg 36mDuloxetine 60mg q3mrn Clonazepam 0.5mg tid75mn.   COPD/Chronic respiratory failure, O2, Trelegy Ellipta qd  S/p right femur fx subcapiral, repeat X-ray in facility in 2 weeks.    Past Medical History:  Diagnosis Date  . CAD (coronary artery disease)   . GAD (generalized anxiety disorder)   . Hyperlipidemia   . Overweight(278.02)   . Pulmonary hypertension (HCC)    [place] Surgical History:  Procedure Laterality Date  . ANAL FISSURE REPAIR    . CARDIAC CATHETERIZATION  2005    . RIGHT/LEFT HEART CATH AND CORONARY ANGIOGRAPHY N/A 11/24/2016   Procedure: Right/Left Heart Cath and Coronary Angiography;  Surgeon: Aaron Boozeocation: [facility]  Service: Cardiovascular;  Laterality: N/A;  . SKIN CANCER EXCISION    . TONSILLECTOMY  1955  . UMBILICAL HERNIA REPAIR  10/07/2011   Procedure: HERNIA REPAIR UMBILICAL ADULT;  Surgeon: Aaron Rolm Bookbinderocation: [facility];  Service: General;  Laterality: N/A;  with mesh     Allergies  Allergen Reactions  . Penicillins Hives    Has patient had a PCN reaction causing immediate rash, facial/tongue/throat swelling, SOB or lightheadedness with hypotension: No Has patient had a PCN reaction causing severe rash involving mucus membranes or skin necrosis: Yes Has patient had a PCN reaction that required hospitalization Unknown Has patient had a PCN reaction occurring within the last 10 years: No If all of the above answers are "NO", then may proceed with Cephalosporin use.   . Nitrates, Organic   . Symbicort [Budesonide-Formoterol Fumarate] Other (See Comments) and Cough    Severe cough    Allergies as of 08/15/2019      Reactions   Penicillins Hives   Has patient had a PCN reaction causing immediate rash, facial/tongue/throat swelling, SOB or lightheadedness with hypotension: No Has patient had a PCN reaction causing severe rash involving mucus membranes or skin necrosis: Yes Has patient had a PCN reaction that required hospitalization Unknown Has  patient had a PCN reaction occurring within the last 10 years: No If all of the above answers are "NO", then may proceed with Cephalosporin use.   Nitrates, Organic    Symbicort [budesonide-formoterol Fumarate] Other (See Comments), Cough   Severe cough      Medication List       Accurate as of August 15, 2019 12:20 PM. If you have any questions, ask your nurse or doctor.        ACIDOPHILUS PO Take 1 capsule by mouth daily.   ammonium lactate  12 % cream Commonly known as: AMLACTIN Apply 1 application topically daily as needed for dry skin.   aspirin 81 MG tablet Take 81 mg by mouth every evening.   Azelastine-Fluticasone 137-50 MCG/ACT Susp Commonly known as: Dymista Place 1-2 sprays into the nose daily as needed. What changed: reasons to take this   CENTRUM CARDIO PO Take 1 tablet by mouth daily.   clonazePAM 0.5 MG tablet Commonly known as: KLONOPIN Take 1 tablet (0.5 mg total) by mouth 3 (three) times daily as needed for up to 15 doses for anxiety.   DULoxetine 60 MG capsule Commonly known as: CYMBALTA Take 60 mg by mouth daily.   feeding supplement Liqd Commonly known as: BOOST / RESOURCE BREEZE Take 1 Container by mouth 3 (three) times daily between meals.   ipratropium 0.06 % nasal spray Commonly known as: ATROVENT Place 2 sprays into both nostrils 4 (four) times daily as needed for rhinitis.   macitentan 10 MG tablet Commonly known as: Opsumit Take 1 tablet (10 mg total) by mouth daily.   midodrine 10 MG tablet Commonly known as: PROAMATINE Take 10 mg by mouth 3 (three) times daily. What changed: Another medication with the same name was removed. Continue taking this medication, and follow the directions you see here. Changed by: Twilia Yaklin X Latavia Goga, NP   mirtazapine 15 MG tablet Commonly known as: REMERON Take 15 mg by mouth at bedtime.   rosuvastatin 40 MG tablet Commonly known as: CRESTOR Take 1 tablet (40 mg total) by mouth daily. Please make overdue appt with Dr. Irish Lack before anymore refills. 2nd attempt   tadalafil 20 MG tablet Commonly known as: CIALIS Take 40 mg by mouth daily.   Trelegy Ellipta 100-62.5-25 MCG/INH Aepb Generic drug: Fluticasone-Umeclidin-Vilant Inhale 1 puff into the lungs daily.   triamcinolone cream 0.1 % Commonly known as: KENALOG Apply 1 application topically daily as needed for irritation.   Tubersol 5 UNIT/0.1ML injection Generic drug: tuberculin Inject into  the skin once. Once A Day Every 14 Days   Uptravi 200 MCG Tabs Generic drug: Selexipag Take 400-600 mcg by mouth 2 (two) times daily. Take 2 tablets in the morning and 3 tablets at night   Vitaline CoQ10 100 MG Wafr Generic drug: Coenzyme Q10 Take 60 mg by mouth daily.   zinc oxide 20 % ointment Apply 1 application topically. To buttocks after every incontinent episode and as needed for redness. May keep at bedside. As Needed       Review of Systems  Constitutional: Positive for fatigue. Negative for activity change, appetite change, chills, diaphoresis and fever.  HENT: Negative for congestion and voice change.   Respiratory: Positive for cough and shortness of breath. Negative for wheezing.   Cardiovascular: Positive for leg swelling. Negative for chest pain and palpitations.  Gastrointestinal: Negative for abdominal distention, abdominal pain, constipation, diarrhea, nausea and vomiting.  Genitourinary: Negative for difficulty urinating, dysuria and urgency.  Musculoskeletal: Positive for  arthralgias and gait problem.  Skin: Positive for color change.  Neurological: Negative for dizziness, speech difficulty, weakness and headaches.  Psychiatric/Behavioral: Negative for agitation, behavioral problems, hallucinations and sleep disturbance. The patient is not nervous/anxious.     Immunization History  Administered Date(s) Administered  . Fluad Quad(high Dose 65+) 06/14/2019  . Influenza, High Dose Seasonal PF 05/29/2015, 05/05/2017  . Influenza,inj,quad, With Preservative 04/23/2016  . Influenza-Unspecified 05/29/2015, 05/22/2017, 06/08/2018  . Pneumococcal Conjugate-13 02/05/2014  . Pneumococcal Polysaccharide-23 01/29/2012  . Tdap 01/29/2012  . Zoster 08/31/2012   Pertinent  Health Maintenance Due  Topic Date Due  . COLONOSCOPY  10/17/1995  . INFLUENZA VACCINE  Completed  . PNA vac Low Risk Adult  Completed   Fall Risk  03/19/2017  Falls in the past year? No    Functional Status Survey:    Vitals:   08/15/19 0853  BP: (!) 88/60  Pulse: 90  Resp: 18  Temp: (!) 97 F (36.1 C)  SpO2: (!) 88%  Weight: 147 lb (66.7 kg)  Height: _0  (1.803 m)   Body mass index is 20.5 kg/m. Physical Exam Vitals and nursing note reviewed.  Constitutional:      General: He is not in acute distress.    Appearance: Normal appearance. He is ill-appearing. He is not toxic-appearing or diaphoretic.  HENT:     Head: Normocephalic and atraumatic.     Nose: Nose normal.     Mouth/Throat:     Mouth: Mucous membranes are dry.  Eyes:     Extraocular Movements: Extraocular movements intact.     Conjunctiva/sclera: Conjunctivae normal.     Pupils: Pupils are equal, round, and reactive to light.  Cardiovascular:     Rate and Rhythm: Normal rate and regular rhythm.     Heart sounds: Murmur present.  Pulmonary:     Breath sounds: Rales present. No wheezing or rhonchi.     Comments: Rales appreciated posterior left lung base. O2 at 5lpm via [place] Abdominal:     General: Bowel sounds are normal. There is no distension.     Palpations: Abdomen is soft.     Tenderness: There is no abdominal tenderness. There is no right CVA tenderness, left CVA tenderness, guarding or rebound.  Musculoskeletal:     Cervical back: Normal range of motion and neck supple.     Right lower leg: Edema present.     Left lower leg: Edema present.     Comments: Trace edema BLE.   Skin:    General: Skin is warm and dry.     Comments: Chronic venous insufficiency skin changes BLE. Purple/blue lips.   Neurological:     General: No focal deficit present.     Mental Status: He is alert and oriented to person, place, and time. Mental status is at baseline.     Motor: No weakness.     Coordination: Coordination normal.     Comments: The patient was examined in bed.   Psychiatric:        Mood and Affect: Mood normal.        Behavior: Behavior normal.        Thought Content: Thought content  normal.        Judgment: Judgment normal.     Labs reviewed: Recent Labs    08/08/19 0245 08/08/19 0609 08/09/19 0319 08/10/19 0258  NA 136 135 136 136  K 5.6* 5.4* 5.7* 5.4*  CL 101  --  102 101  CO2 18*  --  18* 21*  GLUCOSE 157*  --  105* 97  BUN 47*  --  58* 74*  CREATININE 2.09*  --  2.17* 2.51*  CALCIUM 8.6*  --  8.6* 8.4*   Recent Labs    08/19/18 0936 08/08/19 0245  AST 27 64*  ALT 16 32  ALKPHOS 70 144*  BILITOT 1.0 2.2*  PROT 6.5 6.7  ALBUMIN 3.9 3.4*   Recent Labs    08/08/19 0245 08/08/19 0609  WBC 7.7  --   NEUTROABS 6.3  --   HGB 16.3 16.3  HCT 50.5 48.0  MCV 95.8  --   PLT 102*  --    No results found for: TSH No results found for: HGBA1C Lab Results  Component Value Date   CHOL 101 06/12/2016   HDL 37 (L) 06/12/2016   LDLCALC 47 06/12/2016   TRIG 86 06/12/2016   CHOLHDL 2.7 06/12/2016    Significant Diagnostic Results in last 30 days:  CT HEAD WO CONTRAST  Result Date: 08/10/2019 CLINICAL DATA:  Syncope. Fall. EXAM: CT HEAD WITHOUT CONTRAST TECHNIQUE: Contiguous axial images were obtained from the base of the skull through the vertex without intravenous contrast. COMPARISON:  None. FINDINGS: Brain: Mild cortical atrophy. Negative for hydrocephalus. Negative for acute infarct, hemorrhage, mass. Vascular: Negative for hyperdense vessel Skull: Negative Sinuses/Orbits: Negative Other: None IMPRESSION: Mild atrophy. No acute abnormality. Electronically Signed   By: Franchot Gallo M.D.   On: 08/10/2019 10:20   CT HIP RIGHT WO CONTRAST  Result Date: 08/08/2019 CLINICAL DATA:  Right hip pain secondary to a fall. EXAM: CT OF THE RIGHT HIP WITHOUT CONTRAST TECHNIQUE: Multidetector CT imaging of the right hip was performed according to the standard protocol. Multiplanar CT image reconstructions were also generated. COMPARISON:  Radiographs dated 08/08/2019 FINDINGS: Bones/Joint/Cartilage There is a very subtle subcapital fracture of the right femoral  neck with no displacement. The visualized pelvic bones are intact. Muscles and Tendons Normal. Soft tissues Extensive sigmoid diverticulosis. Slight edema in the subcutaneous fat of the buttocks and proximal right femur, indeterminate. IMPRESSION: Subtle subcapital fracture of the right femoral neck. Electronically Signed   By: Lorriane Shire M.D.   On: 08/08/2019 10:01   DG Chest Port 1 View  Result Date: 08/08/2019 CLINICAL DATA:  Shortness of breath. EXAM: PORTABLE CHEST 1 VIEW COMPARISON:  04/28/2017 FINDINGS: Cardiomegaly, new from prior exam. There is a globular configuration of the cardiopericardial silhouette. Atherosclerosis of the thoracic aorta. No focal airspace disease. No evidence of pulmonary edema. Mild emphysema, apical predominant. No pleural effusion or pneumothorax. No acute osseous abnormalities are seen. IMPRESSION: 1. Cardiomegaly, progressed from 2018. Slightly globular configuration of the cardiopericardial silhouette, which can be seen with underlying pericardial effusion. Consider echocardiogram. 2. Emphysema. Aortic Atherosclerosis (ICD10-I70.0) and Emphysema (ICD10-J43.9). Electronically Signed   By: Keith Rake M.D.   On: 08/08/2019 02:55   ECHOCARDIOGRAM COMPLETE  Result Date: 08/08/2019   ECHOCARDIOGRAM REPORT   Patient Name:   [name] Date of Exam: 08/08/2019 Medical Rec #:  [id number]      Height:       71.0 in Accession #:    [id number]     Weight:       165.3 lb Date of Birth:  03-29-1946      BSA:          1.94 m Patient Age:    41 years       BP:  80/63 mmHg Patient Gender: M              HR:           71 bpm. Exam Location:  Inpatient Procedure: 2D Echo Indications:    Pulmonary Embolus I26.99  History:        Patient has prior history of Echocardiogram examinations, most                 recent 05/27/2018. CAD, Pulmonary HTN; Risk Factors:Dyslipidemia.  Sonographer:    Mikki Santee RDCS (AE) Referring Phys: [place]  1.  Left ventricular ejection fraction, by visual estimation, is 60 to 65%. The left ventricle has normal function. There is mildly increased left ventricular hypertrophy.  2. The left ventricle has no regional wall motion abnormalities.  3. Left ventricular diastolic parameters are consistent with Grade II diastolic dysfunction (pseudonormalization).  4. Global right ventricle has severely reduced systolic function.The right ventricular size is severely enlarged. No increase in right ventricular wall thickness. D-shaped interventricular septum suggestive of RV pressure/volume overload.  5. The tricuspid valve is normal in structure. Tricuspid valve regurgitation is severe. Systolic flow reversal on the hepatic vein doppler pattern.  6. The aortic valve is tricuspid. Aortic valve regurgitation is not visualized. Mild aortic valve sclerosis without stenosis.  7. Left atrial size was moderately dilated.  8. Right atrial size was severely dilated.  9. The mitral valve is normal in structure. Trace mitral valve regurgitation. No evidence of mitral stenosis. 10. The inferior vena cava is dilated in size with <50% respiratory variability, suggesting right atrial pressure of 15 mmHg. 11. The tricuspid regurgitant velocity is 3.79 m/s, and with an assumed right atrial pressure of 15 mmHg, the estimated right ventricular systolic pressure is severely elevated at 72.5 mmHg. 12. Trivial pericardial effusion is present. FINDINGS  Left Ventricle: Left ventricular ejection fraction, by visual estimation, is 60 to 65%. The left ventricle has normal function. The left ventricle has no regional wall motion abnormalities. The left ventricular internal cavity size was the left ventricle is normal in size. There is mildly increased left ventricular hypertrophy. Left ventricular diastolic parameters are consistent with Grade II diastolic dysfunction (pseudonormalization). Right Ventricle: The right ventricular size is severely enlarged. No  increase in right ventricular wall thickness. Global RV systolic function is has severely reduced systolic function. The tricuspid regurgitant velocity is 3.79 m/s, and with an assumed right atrial pressure of 15 mmHg, the estimated right ventricular systolic pressure is severely elevated at 72.5 mmHg. Left Atrium: Left atrial size was moderately dilated. Right Atrium: Right atrial size was severely dilated Pericardium: Trivial pericardial effusion is present. Mitral Valve: The mitral valve is normal in structure. No evidence of mitral valve stenosis by observation. Trace mitral valve regurgitation. Tricuspid Valve: The tricuspid valve is normal in structure. Tricuspid valve regurgitation is severe. Aortic Valve: The aortic valve is tricuspid. Aortic valve regurgitation is not visualized. Mild aortic valve sclerosis is present, with no evidence of aortic valve stenosis. Pulmonic Valve: The pulmonic valve was normal in structure. Pulmonic valve regurgitation is not visualized. Aorta: The aortic root is normal in size and structure. Venous: The inferior vena cava is dilated in size with less than 50% respiratory variability, suggesting right atrial pressure of 15 mmHg. IAS/Shunts: No atrial level shunt detected by color flow Doppler.  LEFT VENTRICLE PLAX 2D LVIDd:         4.16 cm  Diastology LVIDs:  2.52 cm  LV e' lateral:   7.95 cm/s LV PW:         0.96 cm  LV E/e' lateral: 5.7 LV IVS:        0.83 cm  LV e' medial:    4.95 cm/s LVOT diam:     2.50 cm  LV E/e' medial:  9.2 LV SV:         54 ml LV SV Index:   27.87 LVOT Area:     4.91 cm  RIGHT VENTRICLE RV S prime:     5.18 cm/s TAPSE (M-mode): 1.0 cm LEFT ATRIUM             Index       RIGHT ATRIUM           Index LA diam:        5.00 cm 2.57 cm/m  RA Area:     33.00 cm LA Vol (A2C):   46.2 ml 23.76 ml/m RA Volume:   125.00 ml 64.28 ml/m LA Vol (A4C):   87.0 ml 44.74 ml/m LA Biplane Vol: 69.2 ml 35.58 ml/m  AORTIC VALVE LVOT Vmax:   58.10 cm/s LVOT  Vmean:  35.200 cm/s LVOT VTI:    0.110 m  AORTA Ao Root diam: 3.50 cm MITRAL VALVE                        TRICUSPID VALVE MV Area (PHT): 4.31 cm             TR Peak grad:   57.5 mmHg MV PHT:        51.04 msec           TR Vmax:        379.00 cm/s MV Decel Time: 176 msec MV E velocity: 45.40 cm/s 103 cm/s  SHUNTS MV A velocity: 42.80 cm/s 70.3 cm/s Systemic VTI:  0.11 m MV E/A ratio:  1.06       1.5       Systemic Diam: 2.50 cm  Loralie Champagne MD Electronically signed by Loralie Champagne MD Signature Date/Time: 08/08/2019/3:36:21 PM    Final    DG Hip Unilat W or Wo Pelvis 2-3 Views Right  Result Date: 08/08/2019 CLINICAL DATA:  Fall with right hip pain EXAM: DG HIP (WITH OR WITHOUT PELVIS) 2-3V RIGHT COMPARISON:  None. FINDINGS: Limited assessment of the right femoral neck due to inward rotation. No definite fracture, although there is subtle cortical overlapping at the lateral femoral neck. No dislocation. Osteopenia. IMPRESSION: Subtle cortical irregularity at the lateral femoral neck, not definite for fracture. Depending on suspicion for fracture a CT or MRI could be obtained. Electronically Signed   By: Monte Fantasia M.D.   On: 08/08/2019 07:57   VAS [place] LOWER EXTREMITY VENOUS (DVT)  Result Date: 08/08/2019  Lower Venous Study Indications: Edema.  Comparison Study: no prior Performing Technologist: Abram Sander RVS  Examination Guidelines: A complete evaluation includes B-mode imaging, spectral Doppler, color Doppler, and power Doppler as needed of all accessible portions of each vessel. Bilateral testing is considered an integral part of a complete examination. Limited examinations for reoccurring indications may be performed as noted.  +---------+---------------+---------+-----------+----------+--------------+ RIGHT    CompressibilityPhasicitySpontaneityPropertiesThrombus Aging +---------+---------------+---------+-----------+----------+--------------+ CFV      Full           Yes      Yes                                  +---------+---------------+---------+-----------+----------+--------------+  SFJ      Full                                                        +---------+---------------+---------+-----------+----------+--------------+ FV Prox  Full                                                        +---------+---------------+---------+-----------+----------+--------------+ FV Mid   Full                                                        +---------+---------------+---------+-----------+----------+--------------+ FV DistalFull                                                        +---------+---------------+---------+-----------+----------+--------------+ PFV      Full                                                        +---------+---------------+---------+-----------+----------+--------------+ POP      Full           Yes      Yes                                 +---------+---------------+---------+-----------+----------+--------------+ PTV      Full                                                        +---------+---------------+---------+-----------+----------+--------------+ PERO                                                  Not visualized +---------+---------------+---------+-----------+----------+--------------+   +---------+---------------+---------+-----------+----------+--------------+ LEFT     CompressibilityPhasicitySpontaneityPropertiesThrombus Aging +---------+---------------+---------+-----------+----------+--------------+ CFV      Full           Yes      Yes                                 +---------+---------------+---------+-----------+----------+--------------+ SFJ      Full                                                        +---------+---------------+---------+-----------+----------+--------------+  FV Prox  Full                                                         +---------+---------------+---------+-----------+----------+--------------+ FV Mid   Full                                                        +---------+---------------+---------+-----------+----------+--------------+ FV DistalFull                                                        +---------+---------------+---------+-----------+----------+--------------+ PFV      Full                                                        +---------+---------------+---------+-----------+----------+--------------+ POP      Full           Yes      Yes                                 +---------+---------------+---------+-----------+----------+--------------+ PTV      Full                                                        +---------+---------------+---------+-----------+----------+--------------+ PERO                                                  Not visualized +---------+---------------+---------+-----------+----------+--------------+     Summary: Right: There is no evidence of deep vein thrombosis in the lower extremity. No cystic structure found in the popliteal fossa. Left: There is no evidence of deep vein thrombosis in the lower extremity. No cystic structure found in the popliteal fossa.  *See table(s) above for measurements and observations. Electronically signed by Harold Barban MD on 08/08/2019 at 5:28:13 PM.    Final     Assessment/Plan Hypotension Will decrease Midodrine to 8m tid [facility]   Acute kidney injury superimposed on CKD ([place] Bun/creat 90/1.99 08/14/19, 74/2.51 08/10/19, 58/2.17 08/09/19. IVF I litre, off Furosemide. Will repeat BMP, LFT stat post IVF.   Pulmonary hypertension (HCC) continue Uptravi 4076m am, 60065mpm. Opsumit 72m78m. Cialis 40mg33m O2 5lpm. [facility] ([facility] Furosemide on hold due to acute on CKD.   Anxiety Mirtazapine 15mg 38mDuloxetine 60mg q65mrn  Clonazepam 0.5mg tid55mn.    Centrilobular emphysema (HCC) O2, Trelegy Ellipta qd      Family/  staff Communication: plan of care reviewed with the patient and charge nurse.   Labs/tests ordered:  Stat BMP, LFT after IVF  Time spend 25 minutes.

## 2019-08-15 NOTE — Assessment & Plan Note (Signed)
Furosemide on hold due to acute on CKD.

## 2019-08-15 NOTE — Assessment & Plan Note (Signed)
Bun/creat 90/1.99 08/14/19, 74/2.51 08/10/19, 58/2.17 08/09/19. IVF I litre, off Furosemide. Will repeat BMP, LFT stat post IVF.

## 2019-08-15 NOTE — Assessment & Plan Note (Signed)
Mirtazapine 63m qd, Duloxetine 659mqd, prn Clonazepam 0.6m30mid prn.

## 2019-08-15 NOTE — Assessment & Plan Note (Signed)
continue Uptravi 49mg am, 6079m pm. Opsumit 1053md. Cialis 48m61m. [facility]2 5lpm. [facility]

## 2019-08-15 NOTE — Assessment & Plan Note (Signed)
Will decrease Midodrine to 59[facility] tid [facility]

## 2019-08-16 ENCOUNTER — Emergency Department (HOSPITAL_COMMUNITY): Payer: Medicare Other

## 2019-08-16 ENCOUNTER — Other Ambulatory Visit: Payer: Self-pay

## 2019-08-16 ENCOUNTER — Encounter: Payer: Self-pay | Admitting: Internal Medicine

## 2019-08-16 ENCOUNTER — Inpatient Hospital Stay (HOSPITAL_COMMUNITY)
Admission: EM | Admit: 2019-08-16 | Discharge: 2019-09-01 | DRG: 175 | Disposition: E | Payer: Medicare Other | Source: Skilled Nursing Facility | Attending: Internal Medicine | Admitting: Internal Medicine

## 2019-08-16 ENCOUNTER — Non-Acute Institutional Stay (SKILLED_NURSING_FACILITY): Payer: Medicare Other | Admitting: Internal Medicine

## 2019-08-16 DIAGNOSIS — N189 Chronic kidney disease, unspecified: Secondary | ICD-10-CM

## 2019-08-16 DIAGNOSIS — Z6820 Body mass index (BMI) 20.0-20.9, adult: Secondary | ICD-10-CM

## 2019-08-16 DIAGNOSIS — Z880 Allergy status to penicillin: Secondary | ICD-10-CM

## 2019-08-16 DIAGNOSIS — I509 Heart failure, unspecified: Secondary | ICD-10-CM

## 2019-08-16 DIAGNOSIS — I7300 Raynaud's syndrome without gangrene: Secondary | ICD-10-CM | POA: Diagnosis present

## 2019-08-16 DIAGNOSIS — I2510 Atherosclerotic heart disease of native coronary artery without angina pectoris: Secondary | ICD-10-CM | POA: Diagnosis present

## 2019-08-16 DIAGNOSIS — I2609 Other pulmonary embolism with acute cor pulmonale: Principal | ICD-10-CM | POA: Diagnosis present

## 2019-08-16 DIAGNOSIS — Z20828 Contact with and (suspected) exposure to other viral communicable diseases: Secondary | ICD-10-CM | POA: Diagnosis present

## 2019-08-16 DIAGNOSIS — Z66 Do not resuscitate: Secondary | ICD-10-CM | POA: Diagnosis present

## 2019-08-16 DIAGNOSIS — I5033 Acute on chronic diastolic (congestive) heart failure: Secondary | ICD-10-CM | POA: Diagnosis present

## 2019-08-16 DIAGNOSIS — E785 Hyperlipidemia, unspecified: Secondary | ICD-10-CM | POA: Diagnosis present

## 2019-08-16 DIAGNOSIS — N179 Acute kidney failure, unspecified: Secondary | ICD-10-CM

## 2019-08-16 DIAGNOSIS — Z9981 Dependence on supplemental oxygen: Secondary | ICD-10-CM

## 2019-08-16 DIAGNOSIS — I2721 Secondary pulmonary arterial hypertension: Secondary | ICD-10-CM | POA: Diagnosis present

## 2019-08-16 DIAGNOSIS — I959 Hypotension, unspecified: Secondary | ICD-10-CM

## 2019-08-16 DIAGNOSIS — I272 Pulmonary hypertension, unspecified: Secondary | ICD-10-CM

## 2019-08-16 DIAGNOSIS — R109 Unspecified abdominal pain: Secondary | ICD-10-CM

## 2019-08-16 DIAGNOSIS — I420 Dilated cardiomyopathy: Secondary | ICD-10-CM | POA: Diagnosis present

## 2019-08-16 DIAGNOSIS — I9589 Other hypotension: Secondary | ICD-10-CM | POA: Diagnosis present

## 2019-08-16 DIAGNOSIS — R64 Cachexia: Secondary | ICD-10-CM | POA: Diagnosis present

## 2019-08-16 DIAGNOSIS — J9621 Acute and chronic respiratory failure with hypoxia: Secondary | ICD-10-CM

## 2019-08-16 DIAGNOSIS — Z87891 Personal history of nicotine dependence: Secondary | ICD-10-CM

## 2019-08-16 DIAGNOSIS — F411 Generalized anxiety disorder: Secondary | ICD-10-CM | POA: Diagnosis present

## 2019-08-16 DIAGNOSIS — N184 Chronic kidney disease, stage 4 (severe): Secondary | ICD-10-CM | POA: Diagnosis present

## 2019-08-16 DIAGNOSIS — Z7951 Long term (current) use of inhaled steroids: Secondary | ICD-10-CM

## 2019-08-16 DIAGNOSIS — J449 Chronic obstructive pulmonary disease, unspecified: Secondary | ICD-10-CM

## 2019-08-16 DIAGNOSIS — Z7982 Long term (current) use of aspirin: Secondary | ICD-10-CM

## 2019-08-16 DIAGNOSIS — S72011D Unspecified intracapsular fracture of right femur, subsequent encounter for closed fracture with routine healing: Secondary | ICD-10-CM

## 2019-08-16 DIAGNOSIS — I2699 Other pulmonary embolism without acute cor pulmonale: Secondary | ICD-10-CM

## 2019-08-16 DIAGNOSIS — J432 Centrilobular emphysema: Secondary | ICD-10-CM | POA: Diagnosis present

## 2019-08-16 DIAGNOSIS — Z823 Family history of stroke: Secondary | ICD-10-CM

## 2019-08-16 DIAGNOSIS — Z515 Encounter for palliative care: Secondary | ICD-10-CM

## 2019-08-16 DIAGNOSIS — Z888 Allergy status to other drugs, medicaments and biological substances status: Secondary | ICD-10-CM

## 2019-08-16 DIAGNOSIS — Z809 Family history of malignant neoplasm, unspecified: Secondary | ICD-10-CM

## 2019-08-16 DIAGNOSIS — I130 Hypertensive heart and chronic kidney disease with heart failure and stage 1 through stage 4 chronic kidney disease, or unspecified chronic kidney disease: Secondary | ICD-10-CM | POA: Diagnosis present

## 2019-08-16 DIAGNOSIS — Z8249 Family history of ischemic heart disease and other diseases of the circulatory system: Secondary | ICD-10-CM

## 2019-08-16 DIAGNOSIS — W19XXXD Unspecified fall, subsequent encounter: Secondary | ICD-10-CM | POA: Diagnosis present

## 2019-08-16 DIAGNOSIS — Z85828 Personal history of other malignant neoplasm of skin: Secondary | ICD-10-CM

## 2019-08-16 LAB — CBC WITH DIFFERENTIAL/PLATELET
Abs Immature Granulocytes: 0.04 10*3/uL (ref 0.00–0.07)
Basophils Absolute: 0 10*3/uL (ref 0.0–0.1)
Basophils Relative: 0 %
Eosinophils Absolute: 0 10*3/uL (ref 0.0–0.5)
Eosinophils Relative: 0 %
HCT: 47.8 % (ref 39.0–52.0)
Hemoglobin: 15.8 g/dL (ref 13.0–17.0)
Immature Granulocytes: 1 %
Lymphocytes Relative: 4 %
Lymphs Abs: 0.3 10*3/uL — ABNORMAL LOW (ref 0.7–4.0)
MCH: 30.6 pg (ref 26.0–34.0)
MCHC: 33.1 g/dL (ref 30.0–36.0)
MCV: 92.5 fL (ref 80.0–100.0)
Monocytes Absolute: 1 10*3/uL (ref 0.1–1.0)
Monocytes Relative: 11 %
Neutro Abs: 7.4 10*3/uL (ref 1.7–7.7)
Neutrophils Relative %: 84 %
Platelets: UNDETERMINED 10*3/uL (ref 150–400)
RBC: 5.17 MIL/uL (ref 4.22–5.81)
RDW: 18 % — ABNORMAL HIGH (ref 11.5–15.5)
WBC: 8.7 10*3/uL (ref 4.0–10.5)
nRBC: 0.2 % (ref 0.0–0.2)

## 2019-08-16 LAB — POCT I-STAT 7, (LYTES, BLD GAS, ICA,H+H)
Acid-base deficit: 6 mmol/L — ABNORMAL HIGH (ref 0.0–2.0)
Acid-base deficit: 6 mmol/L — ABNORMAL HIGH (ref 0.0–2.0)
Bicarbonate: 16.3 mmol/L — ABNORMAL LOW (ref 20.0–28.0)
Bicarbonate: 17.3 mmol/L — ABNORMAL LOW (ref 20.0–28.0)
Calcium, Ion: 1.18 mmol/L (ref 1.15–1.40)
Calcium, Ion: 1.15 mmol/L (ref 1.15–1.40)
HCT: 49 % (ref 39.0–52.0)
HCT: 49 % (ref 39.0–52.0)
Hemoglobin: 16.7 g/dL (ref 13.0–17.0)
Hemoglobin: 16.7 g/dL (ref 13.0–17.0)
O2 Saturation: 74 %
O2 Saturation: 80 %
Patient temperature: 98.2
Potassium: 4 mmol/L (ref 3.5–5.1)
Potassium: 4.1 mmol/L (ref 3.5–5.1)
Sodium: 135 mmol/L (ref 135–145)
Sodium: 136 mmol/L (ref 135–145)
TCO2: 17 mmol/L — ABNORMAL LOW (ref 22–32)
TCO2: 18 mmol/L — ABNORMAL LOW (ref 22–32)
pCO2 arterial: 25.7 mmHg — ABNORMAL LOW (ref 32.0–48.0)
pCO2 arterial: 28 mmHg — ABNORMAL LOW (ref 32.0–48.0)
pH, Arterial: 7.411 (ref 7.350–7.450)
pH, Arterial: 7.4 (ref 7.350–7.450)
pO2, Arterial: 38 mmHg — CL (ref 83.0–108.0)
pO2, Arterial: 43 mmHg — ABNORMAL LOW (ref 83.0–108.0)

## 2019-08-16 LAB — I-STAT CHEM 8, ED
BUN: 90 mg/dL — ABNORMAL HIGH (ref 8–23)
Calcium, Ion: 1.09 mmol/L — ABNORMAL LOW (ref 1.15–1.40)
Chloride: 108 mmol/L (ref 98–111)
Creatinine, Ser: 2.2 mg/dL — ABNORMAL HIGH (ref 0.61–1.24)
Glucose, Bld: 110 mg/dL — ABNORMAL HIGH (ref 70–99)
HCT: 49 % (ref 39.0–52.0)
Hemoglobin: 16.7 g/dL (ref 13.0–17.0)
Potassium: 4.2 mmol/L (ref 3.5–5.1)
Sodium: 136 mmol/L (ref 135–145)
TCO2: 17 mmol/L — ABNORMAL LOW (ref 22–32)

## 2019-08-16 LAB — POC SARS CORONAVIRUS 2 AG -  ED: SARS Coronavirus 2 Ag: NEGATIVE

## 2019-08-16 LAB — BRAIN NATRIURETIC PEPTIDE: B Natriuretic Peptide: 3447.1 pg/mL — ABNORMAL HIGH (ref 0.0–100.0)

## 2019-08-16 MED ORDER — LORAZEPAM 2 MG/ML IJ SOLN
0.5000 mg | Freq: Once | INTRAMUSCULAR | Status: AC
  Administered 2019-08-16: 21:00:00 0.5 mg via INTRAVENOUS
  Filled 2019-08-16: qty 1

## 2019-08-16 MED ORDER — SODIUM CHLORIDE 0.9 % IV BOLUS
500.0000 mL | Freq: Once | INTRAVENOUS | Status: AC
  Administered 2019-08-16: 19:00:00 500 mL via INTRAVENOUS

## 2019-08-16 MED ORDER — SODIUM CHLORIDE 0.9 % IV BOLUS
500.0000 mL | Freq: Once | INTRAVENOUS | Status: AC
  Administered 2019-08-16: 18:00:00 500 mL via INTRAVENOUS

## 2019-08-16 MED ORDER — FUROSEMIDE 10 MG/ML IJ SOLN
40.0000 mg | Freq: Once | INTRAMUSCULAR | Status: AC
  Administered 2019-08-16: 21:00:00 40 mg via INTRAVENOUS
  Filled 2019-08-16: qty 4

## 2019-08-16 MED ORDER — SODIUM CHLORIDE 0.9 % IV BOLUS
1000.0000 mL | Freq: Once | INTRAVENOUS | Status: AC
  Administered 2019-08-16: 1000 mL via INTRAVENOUS

## 2019-08-16 MED ORDER — DEXAMETHASONE SODIUM PHOSPHATE 10 MG/ML IJ SOLN
10.0000 mg | Freq: Once | INTRAMUSCULAR | Status: AC
  Administered 2019-08-16: 21:00:00 10 mg via INTRAVENOUS
  Filled 2019-08-16: qty 1

## 2019-08-16 NOTE — ED Provider Notes (Addendum)
[facility] EMERGENCY DEPARTMENT Provider Note   CSN: [id number] Arrival date & time: 08/20/2019  1538     History Chief Complaint  Patient presents with  . Hypotension    [name] is a 73 y.o. male.  HPI    Elderly male with multiple medical issues including pulmonary hypertension, on home oxygen, presents from his nursing facility with concern for weakness, hypotension and hypoxia. Patient states that since discharge from this facility 5 days ago he has had persistent weakness without focality. Patient is supposed to be on 6 L of oxygen 24/7, but today was on 3 L. He was found to have hypoxia, with room air saturation in the 85% range, and hypotension, 80/40 at his facility.  He denies confusion, disorientation, lightheadedness, new focal pain, acknowledges pain in his right hip where he has a known hip fracture. Patient was in this facility last week after a mechanical fall, diagnosed with subcapital femur fracture.  Given his substantial comorbidities he was not designated as a surgical candidate, was discharged with outpatient management. History is obtained by the patient, and papers from his facility.  During the last hospitalization patient was also diagnosed with acute kidney injury.  He notes no change in his Lasix dosing.   Past Medical History:  Diagnosis Date  . CAD (coronary artery disease)   . GAD (generalized anxiety disorder)   . Hyperlipidemia   . Overweight(278.02)   . Pulmonary hypertension [facility]     Patient Active Problem List   Diagnosis Date Noted  . Hypotension 08/15/2019  . Acute kidney injury superimposed on CKD ([place]) 08/15/2019  . Goals of care, counseling/discussion 08/10/2019  . Cor pulmonale (chronic) ([place]) 08/09/2019  . Acute diastolic CHF (congestive heart failure) ([place]) 08/08/2019  . Diastolic heart failure ([place]) 08/08/2019  . Protein calorie malnutrition ([place]) 04/28/2017  . Thrombocytopenia ([place]) 04/28/2017  .  Centrilobular emphysema ([place]) 02/12/2017  . Pulmonary hypertension ([place]) 02/12/2017  . Chronic respiratory failure with hypoxia ([place]) 02/12/2017  . Acute stress disorder 12/07/2016  . Allergic rhinitis due to pollen 12/07/2016  . Angina pectoris ([place]) 12/07/2016  . Anxiety 12/07/2016  . Cardiac arrhythmia 12/07/2016  . Insomnia 12/07/2016  . Low back pain 12/07/2016  . Obesity 12/07/2016  . Raynaud phenomenon 12/07/2016  . Seborrheic dermatitis 12/07/2016  . Umbilical hernia 37/90/2409  . Dyspnea on exertion 09/24/2016  . Essential hypertension, benign 11/06/2013  . CAD (coronary artery disease)   . GAD (generalized anxiety disorder)   . Hyperlipidemia   . Overweight(278.02)     Past Surgical History:  Procedure Laterality Date  . ANAL FISSURE REPAIR    . CARDIAC CATHETERIZATION  2005  . RIGHT/LEFT HEART CATH AND CORONARY ANGIOGRAPHY N/A 11/24/2016   Procedure: Right/Left Heart Cath and Coronary Angiography;  Surgeon: Jettie Booze, MD;  Location: [facility];  Service: Cardiovascular;  Laterality: N/A;  . SKIN CANCER EXCISION    . TONSILLECTOMY  1955  . UMBILICAL HERNIA REPAIR  10/07/2011   Procedure: HERNIA REPAIR UMBILICAL ADULT;  Surgeon: Rolm Bookbinder, MD;  Location: [facility];  Service: General;  Laterality: N/A;  with mesh        Family History  Problem Relation Age of Onset  . Cancer Sister        breast  . Heart disease Father   . Heart attack Father   . Stroke Father     Social History   Tobacco Use  . Smoking status: Former Smoker  Packs/day: 1.00    Years: 18.00    Pack years: 18.00    Quit date: 09/27/1985    Years since quitting: 33.9  . Smokeless tobacco: Never Used  Substance Use Topics  . Alcohol use: Yes  . Drug use: No    Home Medications Prior to Admission medications   Medication Sig Start Date End Date Taking? Authorizing Provider  ammonium lactate (AMLACTIN) 12 % cream Apply 1 application topically daily as needed for  dry skin. 05/16/19   [provider]  aspirin 81 MG tablet Take 81 mg by mouth every evening.     [provider]  Azelastine-Fluticasone (DYMISTA) 137-50 MCG/ACT SUSP Place 1-2 sprays into the nose daily as needed. Patient taking differently: Place 1-2 sprays into the nose daily as needed (allergies).  12/08/18   Parrett, Fonnie Mu, NP  clonazePAM (KLONOPIN) 0.5 MG tablet Take 1 tablet (0.5 mg total) by mouth 3 (three) times daily as needed for up to 15 doses for anxiety. 08/10/19   Amin, Ankit Chirag, MD  Coenzyme Q10 (VITALINE COQ10) 100 MG WAFR Take 60 mg by mouth daily.    [provider]  DULoxetine (CYMBALTA) 60 MG capsule Take 60 mg by mouth daily.    [provider]  feeding supplement (BOOST / RESOURCE BREEZE) LIQD Take 1 Container by mouth 3 (three) times daily between meals.    [provider]  Fluticasone-Umeclidin-Vilant (TRELEGY ELLIPTA) 100-62.5-25 MCG/INH AEPB Inhale 1 puff into the lungs daily. 06/14/19   Parrett, Tammy S, NP  ipratropium (ATROVENT) 0.06 % nasal spray Place 2 sprays into both nostrils 4 (four) times daily as needed for rhinitis. 04/19/18   Juanito Doom, MD  Lactobacillus (ACIDOPHILUS PO) Take 1 capsule by mouth daily.     [provider]  Macitentan (OPSUMIT) 10 MG TABS Take 1 tablet (10 mg total) by mouth daily. 01/21/17   Bensimhon, Shaune Pascal, MD  midodrine (PROAMATINE) 10 MG tablet Take 5 mg by mouth 3 (three) times daily.     [provider]  mirtazapine (REMERON) 15 MG tablet Take 15 mg by mouth at bedtime.    [provider]  Multiple Vitamins-Minerals (CENTRUM CARDIO PO) Take 1 tablet by mouth daily.     [provider]  rosuvastatin (CRESTOR) 40 MG tablet Take 1 tablet (40 mg total) by mouth daily. Please make overdue appt with Dr. Irish Lack before anymore refills. 2nd attempt 07/07/18   Jettie Booze, MD  tadalafil (CIALIS) 20 MG tablet Take 40 mg by mouth daily.      [provider]  triamcinolone cream (KENALOG) 0.1 % Apply 1 application topically daily as needed for irritation. 05/16/19   [provider]  tuberculin (TUBERSOL) 5 UNIT/0.1ML injection Inject into the skin once. Once A Day Every 14 Days 08/10/19 08/24/19  [provider]  UPTRAVI 200 MCG TABS Take 400-600 mcg by mouth 2 (two) times daily. Take 2 tablets in the morning and 3 tablets at night 06/19/19   [provider]  zinc oxide 20 % ointment Apply 1 application topically. To buttocks after every incontinent episode and as needed for redness. May keep at bedside. As Needed    [provider]    Allergies    Penicillins; Nitrates, organic; and Symbicort [budesonide-formoterol fumarate]  Review of Systems   Review of Systems  Constitutional:       Per HPI, otherwise negative  HENT:       Per HPI, otherwise negative  Respiratory:       Per HPI, otherwise negative  Cardiovascular:       Per HPI, otherwise negative  Gastrointestinal: Negative for vomiting.  Endocrine:       Negative aside from HPI  Genitourinary:       Neg aside from HPI   Musculoskeletal:       Per HPI, otherwise negative  Skin: Negative.   Neurological: Positive for weakness. Negative for syncope.    Physical Exam Updated Vital Signs BP (!) 74/58 (BP Location: Right Arm)   Physical Exam Vitals and nursing note reviewed.  Constitutional:      Appearance: He is well-developed. He is ill-appearing.     Comments: Ill-appearing thin elderly male awake and alert speaking clearly.  HENT:     Head: Normocephalic and atraumatic.  Eyes:     Conjunctiva/sclera: Conjunctivae normal.  Cardiovascular:     Rate and Rhythm: Regular rhythm. Tachycardia present.  Pulmonary:     Effort: Pulmonary effort is normal. No respiratory distress.     Breath sounds: No stridor.  Abdominal:     General: There is no distension.  Musculoskeletal:     Comments: No gross deformities,  patient has known fracture in the right hip.  Skin:    General: Skin is warm and dry.  Neurological:     Mental Status: He is alert and oriented to person, place, and time.     ED Results / Procedures / Treatments   Labs (all labs ordered are listed, but only abnormal results are displayed) Labs Reviewed  CBC WITH DIFFERENTIAL/PLATELET - Abnormal; Notable for the following components:      Result Value   RDW 18.0 (*)    Lymphs Abs 0.3 (*)    All other components within normal limits  BRAIN NATRIURETIC PEPTIDE - Abnormal; Notable for the following components:   B Natriuretic Peptide 3,447.1 (*)    All other components within normal limits  POCT I-STAT 7, (LYTES, BLD GAS, ICA,H+H) - Abnormal; Notable for the following components:   pCO2 arterial 25.7 (*)    pO2, Arterial 38.0 (*)    Bicarbonate 16.3 (*)    TCO2 17 (*)    Acid-base deficit 6.0 (*)    All other components within normal limits  POCT I-STAT 7, (LYTES, BLD GAS, ICA,H+H) - Abnormal; Notable for the following components:   pCO2 arterial 28.0 (*)    pO2, Arterial 43.0 (*)    Bicarbonate 17.3 (*)    TCO2 18 (*)    Acid-base deficit 6.0 (*)    All other components within normal limits  I-STAT CHEM 8, ED - Abnormal; Notable for the following components:   BUN 90 (*)    Creatinine, Ser 2.20 (*)    Glucose, Bld 110 (*)    Calcium, Ion 1.09 (*)    TCO2 17 (*)    All other components within normal limits  SARS CORONAVIRUS 2 (TAT 6-24 HRS)  COMPREHENSIVE METABOLIC PANEL  MAGNESIUM  BLOOD GAS, ARTERIAL  POC SARS CORONAVIRUS 2 AG -  ED   Patient hypotensive on arrival, with blood pressure 75/60.  Patient also hypoxic, with oxygen saturation 80% on room air.  With 6 L via nasal cannula patient has improved oxygenation status.  EKG EKG Interpretation  Date/Time:  Wednesday August 16 2019 20:55:21 EST Ventricular Rate:  98 PR Interval:    QRS Duration: 109 QT Interval:  384 QTC Calculation: 491 R  Axis:   -99 Text Interpretation: Sinus rhythm  Premature atrial complexes Baseline wander Artifact poor study, not overtly ischemic Abnormal ECG Confirmed by Carmin Muskrat [phone]) on 08/15/2019 9:25:46 PM   Radiology DG Chest Port 1 View  Result Date: 08/26/2019 CLINICAL DATA:  Hypotension, hypoxia EXAM: PORTABLE CHEST 1 VIEW COMPARISON:  08/08/2019 chest radiograph. FINDINGS: Stable cardiomediastinal silhouette with mild cardiomegaly. No pneumothorax. No pleural effusion. Mild diffuse prominence of the parahilar linear interstitial markings. IMPRESSION: Mild cardiomegaly with new mild diffuse prominence of the linear parahilar interstitial markings, favoring mild cardiogenic pulmonary edema. Electronically Signed   By: Ilona Sorrel M.D.   On: 08/13/2019 17:06    Procedures Procedures (including critical care time)  CRITICAL CARE Performed by: Carmin Muskrat Total critical care time: 50 minutes Critical care time was exclusive of separately billable procedures and treating other patients. Critical care was necessary to treat or prevent imminent or life-threatening deterioration. Critical care was time spent personally by me on the following activities: development of treatment plan with patient and/or surrogate as well as nursing, discussions with consultants, evaluation of patient's response to treatment, examination of patient, obtaining history from patient or surrogate, ordering and performing treatments and interventions, ordering and review of laboratory studies, ordering and review of radiographic studies, pulse oximetry and re-evaluation of patient's condition.   Medications Ordered in ED Medications  sodium chloride 0.9 % bolus 500 mL (has no administration in time range)    ED Course  I have reviewed the triage vital signs and the nursing notes.  Pertinent labs & imaging results that were available during my care of the patient were reviewed by me and considered in my  medical decision making (see chart for details).    MDM Rules/Calculators/A&P                     8:32 PM Patient now substantially worse than on arrival, with increased work of breathing, but no wheezing. Patient's BNP is 3400, consistent with patient's history of heart failure, concern for worsening.  Patient has been on nonrebreather mask, but states that he is very comfortable. On switching to nasal cannula, there is no appreciable change in the numbers on his pulse oximetry, though waveform is inconsistent, and the patient notes that he rarely has good values, even with his pulmonologist. We confirmed goals of care, and though the patient wants medical resuscitation, he confirms he does not want CPR, nor intubation.  Patient's creatinine is not back, and it is noted that he did have renal injury during last hospitalization but given concern for his worsening respiratory status, elevated BNP, the patient will receive IV Lasix,, stat Covid pending.  11:25 PM Patient has been on high flow nasal cannula, but with persistent tachypnea, is amenable to trying BiPAP again.  Patient has vital signs that are consistently abnormal with tachypnea, hypoxia, hypotension in spite of diuresis, supplemental oxygen, steroids.  Patient's lung sounds remain clear, he is oriented appropriately. Creatinine slightly up from his recent hospitalization discharge value, though with elevated BUN suggesting intravascular depletion. He has received initial fluid bolus, will continue to do so.  The also on the differential, though the patient has poor renal function, not a candidate for studies.   Patient with chronic respiratory failure, pulmonary hypertension presents with dyspnea, fatigue. Patient is awake, alert, afebrile, but dyspneic initially, new oxygen requirement, which becomes worse during his hospitalization.  Patient required BiPAP, IV Lasix, and with concern for progression of disease, required admission  for acute on chronic respiratory failure.  No  early evidence for pneumonia, point-of-care Covid pending.   Critical care colleagues have seen and evaluated the patient, designated him as appropriate for stepdown. Final Clinical Impression(s) / ED Diagnoses Final diagnoses:  Acute on chronic respiratory failure with hypoxia (HCC)  Hypotension, unspecified hypotension type     Carmin Muskrat, MD 08/03/2019 2330    Carmin Muskrat, MD 08/17/19 [phone]

## 2019-08-16 NOTE — Consult Note (Signed)
Patient with history of severe pulmonary hypertension on home oxygen, COPD/emphysema, chronic diastolic congestive heart failure, hyperlipidemia presenting to the ED for evaluation of hypotension and hypoxia.  EMS reported oxygen saturation in the 50s on 6 L oxygen via nasal cannula.  Hypotensive with systolic in the 33A and diastolic in the 07M.  Work-up in the ED revealed significantly elevated BNP at 3447 and pulmonary edema on chest x-ray.  ABG with evidence of severe hypoxemia (PO2 38).  Patient was placed on 35-40L oxygen via high flow nasal cannula as he initially refused BiPAP.  Repeat ABG with PO2 43.  Patient received 1 L normal saline bolus in the ED and was subsequently given IV Lasix 40 mg and dexamethasone 10 mg.  Patient was seen and examined at bedside.  Slightly somnolent but waking up.  His lips, nose, and ears appeared cyanotic.  Does not appear overtly volume overloaded on exam -lungs clear and no lower extremity edema appreciated.  Persistently hypotensive with systolic in the 22Q.  Oxygen saturation persistently in the 60s to 70s on 40 L oxygen via high flow nasal cannula.  Patient needs to be seen by the critical care team immediately.  Discussed with Dr. Vanita Panda and PCCM has been consulted.  Discussed need for BiPAP and patient has now agreed.

## 2019-08-16 NOTE — Progress Notes (Signed)
ABG results showing for 12/16 at 16:14 are inaccurate. RT obtained mixed venous blood after 2 stick attempts instead of arterial blood. MD is aware and does not want to repeat stick at this time.

## 2019-08-16 NOTE — ED Notes (Addendum)
Dr. Vanita Panda ( EDP) notified on patient's persistent low O2 sat and hypotension , RT notified .

## 2019-08-16 NOTE — ED Triage Notes (Signed)
Pt arrived via [facility] from [place] where staff called EMS for hypotension and hypoxia. Pt arrived alert and oriented. Cyanosis noted to both ears, nose, and soles of feet. Pt fell and broke his hip 5 days ago and was seen here at [facility] before being discharged back to facility. EMS reported Sp02 range to be 57%-80% on 6lpm [place]. Ems BP 75/4_'s.

## 2019-08-16 NOTE — Progress Notes (Signed)
Location:   [facility] Room Number: [place] of Service:  SNF [phone]) Provider:  Veleta Miners MD  Aaron Cruel, MD  Patient Care Team: Aaron Cruel, MD as PCP - General [facility])  Extended Emergency Contact Information Primary Emergency Contact: Edwards,Colette Address: [address] [place]          [place], [place] [address] [name] of [place] Phone: [phone] Relation: Spouse  Code Status:  Full Code Goals of care: Advanced Directive information Advanced Directives 08/11/2019  Does Patient Have a Medical Advance Directive? No  Type of Advance Directive -  Does patient want to make changes to medical advance directive? -  Copy of [facility] in Chart? -  Would patient like information on creating a medical advance directive? No - Patient declined  Pre-existing out of facility DNR order (yellow form or pink MOST form) -     Chief Complaint  Patient presents with  . Acute Visit    Follow up of renal function    HPI:  Pt is a 73 y.o. male seen today for an acute visit for Follow up of His renal Function  Patient was admittedin the hospitalfrom 12/8 -12/28fr acute on chronic respiratory distress, pulmonary hypertension, worsening CKD and nondisplaced right Subcapitalfemur fracture Patient has a history of severe pulmonary hypertensionwith cor pulmonale. 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 He also had some worsening of his shortness of breath. He was sent to ED for further evaluation. In ED he was found to have subtle right subcapital femur fracture. But as he is high risk for significantly patient it was decided to be managed conservatively. He also had acute renal insufficiency. He was treated with gentle diuresis as his BNP was elevated  too.He also was started on doxycycline His echo showed EF of 60 to 65% but severely reduced right systolic function with severe TR Also had Dopplers of lower extremity which were negative forThrombus  In the facility his Renal Function got worse on Lasix. So it was discontinued and he was started on IV fluids. He got 2 litre's so far. He has gained 3 lbs back now to his baseline weight of 150 lbs. He feels better. Still tired. Was falling asleep while talking to me. No SOB more then Usual. No Cough. Color much better.  Appetite is still Poor    Past Medical History:  Diagnosis Date  . CAD (coronary artery disease)   . GAD (generalized anxiety disorder)   . Hyperlipidemia   . Overweight(278.02)   . Pulmonary hypertension ([place]    Past Surgical History:  Procedure Laterality Date  . ANAL FISSURE REPAIR    . CARDIAC CATHETERIZATION  2005  . RIGHT/LEFT HEART CATH AND CORONARY ANGIOGRAPHY N/A 11/24/2016   Procedure: Right/Left Heart Cath and Coronary Angiography;  Surgeon: JJettie Booze MD;  [facility];  Service: Cardiovascular;  Laterality: N/A;  . SKIN CANCER EXCISION    . TONSILLECTOMY  1955  . UMBILICAL HERNIA REPAIR  10/07/2011   Procedure: HERNIA REPAIR UMBILICAL ADULT;  Surgeon: MRolm Bookbinder MD;  Location: [facility];  Service: General;  Laterality: N/A;  with mesh     Allergies  Allergen Reactions  . Penicillins Hives    Has patient had a PCN reaction  causing immediate rash, facial/tongue/throat swelling, SOB or lightheadedness with hypotension: No Has patient had a PCN reaction causing severe rash involving mucus membranes or skin necrosis: Yes Has patient had a PCN reaction that required hospitalization Unknown Has patient had a PCN reaction occurring within the last 10 years: No If all of the above answers are "NO", then may proceed with Cephalosporin use.   . Nitrates, Organic   . Symbicort [Budesonide-Formoterol Fumarate] Other (See  Comments) and Cough    Severe cough    Allergies as of 08/28/2019      Reactions   Penicillins Hives   Has patient had a PCN reaction causing immediate rash, facial/tongue/throat swelling, SOB or lightheadedness with hypotension: No Has patient had a PCN reaction causing severe rash involving mucus membranes or skin necrosis: Yes Has patient had a PCN reaction that required hospitalization Unknown Has patient had a PCN reaction occurring within the last 10 years: No If all of the above answers are "NO", then may proceed with Cephalosporin use.   Nitrates, Organic    Symbicort [budesonide-formoterol Fumarate] Other (See Comments), Cough   Severe cough      Medication List       Accurate as of August 16, 2019  9:37 AM. If you have any questions, ask your nurse or doctor.        ACIDOPHILUS PO Take 1 capsule by mouth daily.   ammonium lactate 12 % cream Commonly known as: AMLACTIN Apply 1 application topically daily as needed for dry skin.   aspirin 81 MG tablet Take 81 mg by mouth every evening.   Azelastine-Fluticasone 137-50 MCG/ACT Susp Commonly known as: Dymista Place 1-2 sprays into the nose daily as needed. What changed: reasons to take this   CENTRUM CARDIO PO Take 1 tablet by mouth daily.   clonazePAM 0.5 MG tablet Commonly known as: KLONOPIN Take 1 tablet (0.5 mg total) by mouth 3 (three) times daily as needed for up to 15 doses for anxiety.   DULoxetine 60 MG capsule Commonly known as: CYMBALTA Take 60 mg by mouth daily.   feeding supplement Liqd Commonly known as: BOOST / RESOURCE BREEZE Take 1 Container by mouth 3 (three) times daily between meals.   ipratropium 0.06 % nasal spray Commonly known as: ATROVENT Place 2 sprays into both nostrils 4 (four) times daily as needed for rhinitis.   macitentan 10 MG tablet Commonly known as: Opsumit Take 1 tablet (10 mg total) by mouth daily.   midodrine 10 MG tablet Commonly known as: PROAMATINE Take 5  mg by mouth 3 (three) times daily.   mirtazapine 15 MG tablet Commonly known as: REMERON Take 15 mg by mouth at bedtime.   rosuvastatin 40 MG tablet Commonly known as: CRESTOR Take 1 tablet (40 mg total) by mouth daily. Please make overdue appt with Dr. Irish Lack before anymore refills. 2nd attempt   tadalafil 20 MG tablet Commonly known as: CIALIS Take 40 mg by mouth daily.   Trelegy Ellipta 100-62.5-25 MCG/INH Aepb Generic drug: Fluticasone-Umeclidin-Vilant Inhale 1 puff into the lungs daily.   triamcinolone cream 0.1 % Commonly known as: KENALOG Apply 1 application topically daily as needed for irritation.   Tubersol 5 UNIT/0.1ML injection Generic drug: tuberculin Inject into the skin once. Once A Day Every 14 Days   Uptravi 200 MCG Tabs Generic drug: Selexipag Take 400-600 mcg by mouth 2 (two) times daily. Take 2 tablets in the morning and 3 tablets at night   Vitaline CoQ10 100 MG Wafr  Generic drug: Coenzyme Q10 Take 60 mg by mouth daily.   zinc oxide 20 % ointment Apply 1 application topically. To buttocks after every incontinent episode and as needed for redness. May keep at bedside. As Needed       Review of Systems  Review of Systems  Constitutional: Negative for activity change, appetite change, chills, diaphoresis, fatigue and fever.  HENT: Negative for mouth sores, postnasal drip, rhinorrhea, sinus pain and sore throat.   Respiratory: Negative for apnea, cough, chest tightness, shortness of breath and wheezing.   Cardiovascular: Negative for chest pain, palpitations and leg swelling.  Gastrointestinal: Negative for abdominal distention, abdominal pain,Positive for  constipation, No diarrhea, nausea and vomiting.  Genitourinary: Negative for dysuria and frequency.  Musculoskeletal: Negative for arthralgias, joint swelling and myalgias.  Skin: Negative for rash.  Neurological: Negative for dizziness, syncope, weakness, light-headedness and numbness.    Psychiatric/Behavioral: Negative for behavioral problems, confusion and sleep disturbance.     Immunization History  Administered Date(s) Administered  . Fluad Quad(high Dose 65+) 06/14/2019  . Influenza, High Dose Seasonal PF 05/29/2015, 05/05/2017  . Influenza,inj,quad, With Preservative 04/23/2016  . Influenza-Unspecified 05/29/2015, 05/22/2017, 06/08/2018  . Pneumococcal Conjugate-13 02/05/2014  . Pneumococcal Polysaccharide-23 01/29/2012  . Tdap 01/29/2012  . Zoster 08/31/2012   Pertinent  Health Maintenance Due  Topic Date Due  . COLONOSCOPY  10/17/1995  . INFLUENZA VACCINE  Completed  . PNA vac Low Risk Adult  Completed   Fall Risk  03/19/2017  Falls in the past year? No   Functional Status Survey:    Vitals:   08/31/2019 0932  BP: (!) 88/62  Pulse: 100  Resp: 20  Temp: 98.2 F (36.8 C)  SpO2: (!) 84%  Weight: 150 lb (68 kg)  Height: _0  (1.803 m)   Body mass index is 20.92 kg/m. Physical Exam  Constitutional: Oriented to person, place, and time. Well-developed and well-nourished.  HENT:  Head: Normocephalic.  Mouth/Throat: Oropharynx is clear and moist.  Eyes: Pupils are equal, round, and reactive to light.  Neck: Neck supple.  Cardiovascular: Normal rate and normal heart sounds.  Murmur Present Pulmonary/Chest: Effort normal and breath sounds normal. No respiratory distress. No wheezes. He  has no rales.  Abdominal: Soft. Bowel sounds are normal. No distension. There is no tenderness. There is no rebound.  Musculoskeletal: Peripheries are Warm and less Blue. Mild edema Bilateral Lymphadenopathy: none Neurological: Alert and oriented to person, place, and time.  Skin: Skin is warm and dry.  Psychiatric: Normal mood and affect. Behavior is normal. Thought content normal.    Labs reviewed: Recent Labs    08/08/19 0245 08/08/19 0609 08/09/19 0319 08/10/19 0258  NA 136 135 136 136  K 5.6* 5.4* 5.7* 5.4*  CL 101  --  102 101  CO2 18*  --  18*  21*  GLUCOSE 157*  --  105* 97  BUN 47*  --  58* 74*  CREATININE 2.09*  --  2.17* 2.51*  CALCIUM 8.6*  --  8.6* 8.4*   Recent Labs    08/19/18 0936 08/08/19 0245  AST 27 64*  ALT 16 32  ALKPHOS 70 144*  BILITOT 1.0 2.2*  PROT 6.5 6.7  ALBUMIN 3.9 3.4*   Recent Labs    08/08/19 0245 08/08/19 0609  WBC 7.7  --   NEUTROABS 6.3  --   HGB 16.3 16.3  HCT 50.5 48.0  MCV 95.8  --   PLT 102*  --    No  results found for: TSH No results found for: HGBA1C Lab Results  Component Value Date   CHOL 101 06/12/2016   HDL 37 (L) 06/12/2016   LDLCALC 47 06/12/2016   TRIG 86 06/12/2016   CHOLHDL 2.7 06/12/2016    Significant Diagnostic Results in last 30 days:  CT HEAD WO CONTRAST  Result Date: 08/10/2019 CLINICAL DATA:  Syncope. Fall. EXAM: CT HEAD WITHOUT CONTRAST TECHNIQUE: Contiguous axial images were obtained from the base of the skull through the vertex without intravenous contrast. COMPARISON:  None. FINDINGS: Brain: Mild cortical atrophy. Negative for hydrocephalus. Negative for acute infarct, hemorrhage, mass. Vascular: Negative for hyperdense vessel Skull: Negative Sinuses/Orbits: Negative Other: None IMPRESSION: Mild atrophy. No acute abnormality. Electronically Signed   By: Franchot Gallo M.D.   On: 08/10/2019 10:20   CT HIP RIGHT WO CONTRAST  Result Date: 08/08/2019 CLINICAL DATA:  Right hip pain secondary to a fall. EXAM: CT OF THE RIGHT HIP WITHOUT CONTRAST TECHNIQUE: Multidetector CT imaging of the right hip was performed according to the standard protocol. Multiplanar CT image reconstructions were also generated. COMPARISON:  Radiographs dated 08/08/2019 FINDINGS: Bones/Joint/Cartilage There is a very subtle subcapital fracture of the right femoral neck with no displacement. The visualized pelvic bones are intact. Muscles and Tendons Normal. Soft tissues Extensive sigmoid diverticulosis. Slight edema in the subcutaneous fat of the buttocks and proximal right femur,  indeterminate. IMPRESSION: Subtle subcapital fracture of the right femoral neck. Electronically Signed   By: Lorriane Shire M.D.   On: 08/08/2019 10:01   DG Chest Port 1 View  Result Date: 08/08/2019 CLINICAL DATA:  Shortness of breath. EXAM: PORTABLE CHEST 1 VIEW COMPARISON:  04/28/2017 FINDINGS: Cardiomegaly, new from prior exam. There is a globular configuration of the cardiopericardial silhouette. Atherosclerosis of the thoracic aorta. No focal airspace disease. No evidence of pulmonary edema. Mild emphysema, apical predominant. No pleural effusion or pneumothorax. No acute osseous abnormalities are seen. IMPRESSION: 1. Cardiomegaly, progressed from 2018. Slightly globular configuration of the cardiopericardial silhouette, which can be seen with underlying pericardial effusion. Consider echocardiogram. 2. Emphysema. Aortic Atherosclerosis (ICD10-I70.0) and Emphysema (ICD10-J43.9). Electronically Signed   By: Keith Rake M.D.   On: 08/08/2019 02:55   ECHOCARDIOGRAM COMPLETE  Result Date: 08/08/2019   ECHOCARDIOGRAM REPORT   Patient Name:   [name] Date of Exam: 08/08/2019 Medical Rec #:  [id number]      Height:       71.0 in Accession #:    [id number]     Weight:       165.3 lb Date of Birth:  1945-09-24      BSA:          1.94 m Patient Age:    44 years       BP:           80/63 mmHg Patient Gender: M              HR:           71 bpm. Exam Location:  Inpatient Procedure: 2D Echo Indications:    Pulmonary Embolus I26.99  History:        Patient has prior history of Echocardiogram examinations, most                 recent 05/27/2018. CAD, Pulmonary HTN; Risk Factors:Dyslipidemia.  Sonographer:    Mikki Santee RDCS (AE) Referring Phys: [place]  1. Left ventricular ejection fraction, by visual estimation, is 60 to 65%. The  left ventricle has normal function. There is mildly increased left ventricular hypertrophy.  2. The left ventricle has no regional wall motion  abnormalities.  3. Left ventricular diastolic parameters are consistent with Grade II diastolic dysfunction (pseudonormalization).  4. Global right ventricle has severely reduced systolic function.The right ventricular size is severely enlarged. No increase in right ventricular wall thickness. D-shaped interventricular septum suggestive of RV pressure/volume overload.  5. The tricuspid valve is normal in structure. Tricuspid valve regurgitation is severe. Systolic flow reversal on the hepatic vein doppler pattern.  6. The aortic valve is tricuspid. Aortic valve regurgitation is not visualized. Mild aortic valve sclerosis without stenosis.  7. Left atrial size was moderately dilated.  8. Right atrial size was severely dilated.  9. The mitral valve is normal in structure. Trace mitral valve regurgitation. No evidence of mitral stenosis. 10. The inferior vena cava is dilated in size with <50% respiratory variability, suggesting right atrial pressure of 15 mmHg. 11. The tricuspid regurgitant velocity is 3.79 m/s, and with an assumed right atrial pressure of 15 mmHg, the estimated right ventricular systolic pressure is severely elevated at 72.5 mmHg. 12. Trivial pericardial effusion is present. FINDINGS  Left Ventricle: Left ventricular ejection fraction, by visual estimation, is 60 to 65%. The left ventricle has normal function. The left ventricle has no regional wall motion abnormalities. The left ventricular internal cavity size was the left ventricle is normal in size. There is mildly increased left ventricular hypertrophy. Left ventricular diastolic parameters are consistent with Grade II diastolic dysfunction (pseudonormalization). Right Ventricle: The right ventricular size is severely enlarged. No increase in right ventricular wall thickness. Global RV systolic function is has severely reduced systolic function. The tricuspid regurgitant velocity is 3.79 m/s, and with an assumed right atrial pressure of 15 mmHg,  the estimated right ventricular systolic pressure is severely elevated at 72.5 mmHg. Left Atrium: Left atrial size was moderately dilated. Right Atrium: Right atrial size was severely dilated Pericardium: Trivial pericardial effusion is present. Mitral Valve: The mitral valve is normal in structure. No evidence of mitral valve stenosis by observation. Trace mitral valve regurgitation. Tricuspid Valve: The tricuspid valve is normal in structure. Tricuspid valve regurgitation is severe. Aortic Valve: The aortic valve is tricuspid. Aortic valve regurgitation is not visualized. Mild aortic valve sclerosis is present, with no evidence of aortic valve stenosis. Pulmonic Valve: The pulmonic valve was normal in structure. Pulmonic valve regurgitation is not visualized. Aorta: The aortic root is normal in size and structure. Venous: The inferior vena cava is dilated in size with less than 50% respiratory variability, suggesting right atrial pressure of 15 mmHg. IAS/Shunts: No atrial level shunt detected by color flow Doppler.  LEFT VENTRICLE PLAX 2D LVIDd:         4.16 cm  Diastology LVIDs:         2.52 cm  LV e' lateral:   7.95 cm/s LV PW:         0.96 cm  LV E/e' lateral: 5.7 LV IVS:        0.83 cm  LV e' medial:    4.95 cm/s LVOT diam:     2.50 cm  LV E/e' medial:  9.2 LV SV:         54 ml LV SV Index:   27.87 LVOT Area:     4.91 cm  RIGHT VENTRICLE RV S prime:     5.18 cm/s TAPSE (M-mode): 1.0 cm LEFT ATRIUM  Index       RIGHT ATRIUM           Index LA diam:        5.00 cm 2.57 cm/m  RA Area:     33.00 cm LA Vol (A2C):   46.2 ml 23.76 ml/m RA Volume:   125.00 ml 64.28 ml/m LA Vol (A4C):   87.0 ml 44.74 ml/m LA Biplane Vol: 69.2 ml 35.58 ml/m  AORTIC VALVE LVOT Vmax:   58.10 cm/s LVOT Vmean:  35.200 cm/s LVOT VTI:    0.110 m  AORTA Ao Root diam: 3.50 cm MITRAL VALVE                        TRICUSPID VALVE MV Area (PHT): 4.31 cm             TR Peak grad:   57.5 mmHg MV PHT:        51.04 msec           TR  Vmax:        379.00 cm/s MV Decel Time: 176 msec MV E velocity: 45.40 cm/s 103 cm/s  SHUNTS MV A velocity: 42.80 cm/s 70.3 cm/s Systemic VTI:  0.11 m MV E/A ratio:  1.06       1.5       Systemic Diam: 2.50 cm  Loralie Champagne MD Electronically signed by Loralie Champagne MD Signature Date/Time: 08/08/2019/3:36:21 PM    Final    DG Hip Unilat W or Wo Pelvis 2-3 Views Right  Result Date: 08/08/2019 CLINICAL DATA:  Fall with right hip pain EXAM: DG HIP (WITH OR WITHOUT PELVIS) 2-3V RIGHT COMPARISON:  None. FINDINGS: Limited assessment of the right femoral neck due to inward rotation. No definite fracture, although there is subtle cortical overlapping at the lateral femoral neck. No dislocation. Osteopenia. IMPRESSION: Subtle cortical irregularity at the lateral femoral neck, not definite for fracture. Depending on suspicion for fracture a CT or MRI could be obtained. Electronically Signed   By: Monte Fantasia M.D.   On: 08/08/2019 07:57   VAS [place] LOWER EXTREMITY VENOUS (DVT)  Result Date: 08/08/2019  Lower Venous Study Indications: Edema.  Comparison Study: no prior Performing Technologist: Abram Sander RVS  Examination Guidelines: A complete evaluation includes B-mode imaging, spectral Doppler, color Doppler, and power Doppler as needed of all accessible portions of each vessel. Bilateral testing is considered an integral part of a complete examination. Limited examinations for reoccurring indications may be performed as noted.  +---------+---------------+---------+-----------+----------+--------------+ RIGHT    CompressibilityPhasicitySpontaneityPropertiesThrombus Aging +---------+---------------+---------+-----------+----------+--------------+ CFV      Full           Yes      Yes                                 +---------+---------------+---------+-----------+----------+--------------+ SFJ      Full                                                         +---------+---------------+---------+-----------+----------+--------------+ FV Prox  Full                                                        +---------+---------------+---------+-----------+----------+--------------+  FV Mid   Full                                                        +---------+---------------+---------+-----------+----------+--------------+ FV DistalFull                                                        +---------+---------------+---------+-----------+----------+--------------+ PFV      Full                                                        +---------+---------------+---------+-----------+----------+--------------+ POP      Full           Yes      Yes                                 +---------+---------------+---------+-----------+----------+--------------+ PTV      Full                                                        +---------+---------------+---------+-----------+----------+--------------+ PERO                                                  Not visualized +---------+---------------+---------+-----------+----------+--------------+   +---------+---------------+---------+-----------+----------+--------------+ LEFT     CompressibilityPhasicitySpontaneityPropertiesThrombus Aging +---------+---------------+---------+-----------+----------+--------------+ CFV      Full           Yes      Yes                                 +---------+---------------+---------+-----------+----------+--------------+ SFJ      Full                                                        +---------+---------------+---------+-----------+----------+--------------+ FV Prox  Full                                                        +---------+---------------+---------+-----------+----------+--------------+ FV Mid   Full                                                         +---------+---------------+---------+-----------+----------+--------------+  FV DistalFull                                                        +---------+---------------+---------+-----------+----------+--------------+ PFV      Full                                                        +---------+---------------+---------+-----------+----------+--------------+ POP      Full           Yes      Yes                                 +---------+---------------+---------+-----------+----------+--------------+ PTV      Full                                                        +---------+---------------+---------+-----------+----------+--------------+ PERO                                                  Not visualized +---------+---------------+---------+-----------+----------+--------------+     Summary: Right: There is no evidence of deep vein thrombosis in the lower extremity. No cystic structure found in the popliteal fossa. Left: There is no evidence of deep vein thrombosis in the lower extremity. No cystic structure found in the popliteal fossa.  *See table(s) above for measurements and observations. Electronically signed by Harold Barban MD on 08/08/2019 at 5:28:13 PM.    Final     Assessment/Plan Acute on chronic renal insufficiency BUN and Creat slightly improved after one Litre 91/1.97 from 99/1.99 Gave him one more Litre.Repeat  BMP pending  Addendum Repeat Bmp showed Bun Has gone up to 104 Creat is still 2 This is after 2 litres of NS Will d/w his wife and send him to the hospital   Pulmonary hypertension  On 5 litres of Oxygen as Nurses unable to register POX.Usually around 80% Continue to follow him Clinically [place] Uptravi D/W the Wife and Discontinued Revatio Have D/W his Pulmonologist in [place]. Will continue to closely watch him for CHF as giving him IV fluids Right now wife and Patient wants to stay here. Any change will send to  ED   Acute over Chronic CHF Weight is at Baseline BNP in the hospital was very high Holding his Lasix for now Hard in him due to his worsening Renal Fucntion  Hypotension Continue Low dose of Midodrine Cannot increase much due to his Right ventricular   Raynaud's phenomenon without gangrene  S/P Subcapitalright Femurfracture NW bearingright now Working with therapy Pain seems controlled Follow up with [facility] 2 weeks Will repeat Xray in the facility per Wife request  Anxiety On Cymbalta ,Remeron And Klonopin.  Discontinue Ativan  Mixed hyperlipidemia On Crestor   Family/ staff Communication:   Labs/tests ordered:   Total time  spent in this patient care encounter was  45_  minutes; greater than 50% of the visit spent counseling patient and staff, reviewing records , Labs and coordinating care for problems addressed at this encounter.

## 2019-08-17 ENCOUNTER — Inpatient Hospital Stay (HOSPITAL_COMMUNITY): Payer: Medicare Other

## 2019-08-17 ENCOUNTER — Encounter: Payer: Self-pay | Admitting: Nurse Practitioner

## 2019-08-17 DIAGNOSIS — I272 Pulmonary hypertension, unspecified: Secondary | ICD-10-CM

## 2019-08-17 DIAGNOSIS — Z9981 Dependence on supplemental oxygen: Secondary | ICD-10-CM | POA: Diagnosis not present

## 2019-08-17 DIAGNOSIS — I2609 Other pulmonary embolism with acute cor pulmonale: Secondary | ICD-10-CM | POA: Diagnosis present

## 2019-08-17 DIAGNOSIS — J9621 Acute and chronic respiratory failure with hypoxia: Secondary | ICD-10-CM | POA: Diagnosis present

## 2019-08-17 DIAGNOSIS — I420 Dilated cardiomyopathy: Secondary | ICD-10-CM | POA: Diagnosis present

## 2019-08-17 DIAGNOSIS — W19XXXD Unspecified fall, subsequent encounter: Secondary | ICD-10-CM | POA: Diagnosis present

## 2019-08-17 DIAGNOSIS — R64 Cachexia: Secondary | ICD-10-CM | POA: Diagnosis present

## 2019-08-17 DIAGNOSIS — I2510 Atherosclerotic heart disease of native coronary artery without angina pectoris: Secondary | ICD-10-CM | POA: Diagnosis present

## 2019-08-17 DIAGNOSIS — F411 Generalized anxiety disorder: Secondary | ICD-10-CM | POA: Diagnosis present

## 2019-08-17 DIAGNOSIS — I50813 Acute on chronic right heart failure: Secondary | ICD-10-CM | POA: Diagnosis not present

## 2019-08-17 DIAGNOSIS — J432 Centrilobular emphysema: Secondary | ICD-10-CM | POA: Diagnosis present

## 2019-08-17 DIAGNOSIS — I9589 Other hypotension: Secondary | ICD-10-CM | POA: Diagnosis present

## 2019-08-17 DIAGNOSIS — I959 Hypotension, unspecified: Secondary | ICD-10-CM | POA: Diagnosis not present

## 2019-08-17 DIAGNOSIS — Z85828 Personal history of other malignant neoplasm of skin: Secondary | ICD-10-CM | POA: Diagnosis not present

## 2019-08-17 DIAGNOSIS — I509 Heart failure, unspecified: Secondary | ICD-10-CM

## 2019-08-17 DIAGNOSIS — Z87891 Personal history of nicotine dependence: Secondary | ICD-10-CM | POA: Diagnosis not present

## 2019-08-17 DIAGNOSIS — R0602 Shortness of breath: Secondary | ICD-10-CM

## 2019-08-17 DIAGNOSIS — E785 Hyperlipidemia, unspecified: Secondary | ICD-10-CM | POA: Diagnosis present

## 2019-08-17 DIAGNOSIS — Z809 Family history of malignant neoplasm, unspecified: Secondary | ICD-10-CM | POA: Diagnosis not present

## 2019-08-17 DIAGNOSIS — J449 Chronic obstructive pulmonary disease, unspecified: Secondary | ICD-10-CM

## 2019-08-17 DIAGNOSIS — Z20828 Contact with and (suspected) exposure to other viral communicable diseases: Secondary | ICD-10-CM | POA: Diagnosis present

## 2019-08-17 DIAGNOSIS — Z7982 Long term (current) use of aspirin: Secondary | ICD-10-CM | POA: Diagnosis not present

## 2019-08-17 DIAGNOSIS — Z8249 Family history of ischemic heart disease and other diseases of the circulatory system: Secondary | ICD-10-CM | POA: Diagnosis not present

## 2019-08-17 DIAGNOSIS — Z66 Do not resuscitate: Secondary | ICD-10-CM | POA: Diagnosis present

## 2019-08-17 DIAGNOSIS — I5033 Acute on chronic diastolic (congestive) heart failure: Secondary | ICD-10-CM | POA: Diagnosis present

## 2019-08-17 DIAGNOSIS — J431 Panlobular emphysema: Secondary | ICD-10-CM | POA: Diagnosis not present

## 2019-08-17 DIAGNOSIS — N179 Acute kidney failure, unspecified: Secondary | ICD-10-CM | POA: Diagnosis present

## 2019-08-17 DIAGNOSIS — Z515 Encounter for palliative care: Secondary | ICD-10-CM | POA: Diagnosis not present

## 2019-08-17 DIAGNOSIS — Z823 Family history of stroke: Secondary | ICD-10-CM | POA: Diagnosis not present

## 2019-08-17 DIAGNOSIS — N184 Chronic kidney disease, stage 4 (severe): Secondary | ICD-10-CM | POA: Diagnosis present

## 2019-08-17 DIAGNOSIS — I130 Hypertensive heart and chronic kidney disease with heart failure and stage 1 through stage 4 chronic kidney disease, or unspecified chronic kidney disease: Secondary | ICD-10-CM | POA: Diagnosis present

## 2019-08-17 DIAGNOSIS — N183 Chronic kidney disease, stage 3 unspecified: Secondary | ICD-10-CM | POA: Insufficient documentation

## 2019-08-17 DIAGNOSIS — I7300 Raynaud's syndrome without gangrene: Secondary | ICD-10-CM | POA: Diagnosis present

## 2019-08-17 LAB — CBC
HCT: 42.6 % (ref 39.0–52.0)
Hemoglobin: 14 g/dL (ref 13.0–17.0)
MCH: 30.7 pg (ref 26.0–34.0)
MCHC: 32.9 g/dL (ref 30.0–36.0)
MCV: 93.4 fL (ref 80.0–100.0)
Platelets: 97 10*3/uL — ABNORMAL LOW (ref 150–400)
RBC: 4.56 MIL/uL (ref 4.22–5.81)
RDW: 17.9 % — ABNORMAL HIGH (ref 11.5–15.5)
WBC: 5.8 10*3/uL (ref 4.0–10.5)
nRBC: 0 % (ref 0.0–0.2)

## 2019-08-17 LAB — POCT I-STAT 7, (LYTES, BLD GAS, ICA,H+H)
Acid-base deficit: 7 mmol/L — ABNORMAL HIGH (ref 0.0–2.0)
Bicarbonate: 16.9 mmol/L — ABNORMAL LOW (ref 20.0–28.0)
Calcium, Ion: 1.17 mmol/L (ref 1.15–1.40)
HCT: 45 % (ref 39.0–52.0)
Hemoglobin: 15.3 g/dL (ref 13.0–17.0)
O2 Saturation: 92 %
Patient temperature: 98
Potassium: 4.1 mmol/L (ref 3.5–5.1)
Sodium: 138 mmol/L (ref 135–145)
TCO2: 18 mmol/L — ABNORMAL LOW (ref 22–32)
pCO2 arterial: 28.9 mmHg — ABNORMAL LOW (ref 32.0–48.0)
pH, Arterial: 7.373 (ref 7.350–7.450)
pO2, Arterial: 64 mmHg — ABNORMAL LOW (ref 83.0–108.0)

## 2019-08-17 LAB — HEPARIN LEVEL (UNFRACTIONATED)
Heparin Unfractionated: <0.1 IU/mL — ABNORMAL LOW (ref 0.30–0.70)
Heparin Unfractionated: 0.1 IU/mL — ABNORMAL LOW (ref 0.30–0.70)
Heparin Unfractionated: 0.23 IU/mL — ABNORMAL LOW (ref 0.30–0.70)

## 2019-08-17 LAB — URINALYSIS, ROUTINE W REFLEX MICROSCOPIC
Bilirubin Urine: NEGATIVE
Glucose, UA: NEGATIVE mg/dL
Hgb urine dipstick: NEGATIVE
Ketones, ur: NEGATIVE mg/dL
Leukocytes,Ua: NEGATIVE
Nitrite: NEGATIVE
Protein, ur: 30 mg/dL — AB
Specific Gravity, Urine: 1.01 (ref 1.005–1.030)
pH: 5 (ref 5.0–8.0)

## 2019-08-17 LAB — MAGNESIUM: Magnesium: 2.7 mg/dL — ABNORMAL HIGH (ref 1.7–2.4)

## 2019-08-17 LAB — SARS CORONAVIRUS 2 (TAT 6-24 HRS): SARS Coronavirus 2: NEGATIVE

## 2019-08-17 LAB — BASIC METABOLIC PANEL
Anion gap: 14 (ref 5–15)
BUN: 100 mg/dL — ABNORMAL HIGH (ref 8–23)
CO2: 16 mmol/L — ABNORMAL LOW (ref 22–32)
Calcium: 8 mg/dL — ABNORMAL LOW (ref 8.9–10.3)
Chloride: 109 mmol/L (ref 98–111)
Creatinine, Ser: 2.18 mg/dL — ABNORMAL HIGH (ref 0.61–1.24)
GFR calc Af Amer: 34 mL/min — ABNORMAL LOW (ref >60)
GFR calc non Af Amer: 29 mL/min — ABNORMAL LOW (ref >60)
Glucose, Bld: 122 mg/dL — ABNORMAL HIGH (ref 70–99)
Potassium: 4.4 mmol/L (ref 3.5–5.1)
Sodium: 139 mmol/L (ref 135–145)

## 2019-08-17 LAB — COMPREHENSIVE METABOLIC PANEL
ALT: 21 U/L (ref 0–44)
AST: 25 U/L (ref 15–41)
Albumin: 2.4 g/dL — ABNORMAL LOW (ref 3.5–5.0)
Alkaline Phosphatase: 86 U/L (ref 38–126)
Anion gap: 11 (ref 5–15)
BUN: 101 mg/dL — ABNORMAL HIGH (ref 8–23)
CO2: 15 mmol/L — ABNORMAL LOW (ref 22–32)
Calcium: 7.7 mg/dL — ABNORMAL LOW (ref 8.9–10.3)
Chloride: 110 mmol/L (ref 98–111)
Creatinine, Ser: 2.06 mg/dL — ABNORMAL HIGH (ref 0.61–1.24)
GFR calc Af Amer: 36 mL/min — ABNORMAL LOW (ref >60)
GFR calc non Af Amer: 31 mL/min — ABNORMAL LOW (ref >60)
Glucose, Bld: 122 mg/dL — ABNORMAL HIGH (ref 70–99)
Potassium: 4.3 mmol/L (ref 3.5–5.1)
Sodium: 136 mmol/L (ref 135–145)
Total Bilirubin: 1.2 mg/dL (ref 0.3–1.2)
Total Protein: 4.8 g/dL — ABNORMAL LOW (ref 6.5–8.1)

## 2019-08-17 LAB — PROCALCITONIN: Procalcitonin: 0.25 ng/mL

## 2019-08-17 MED ORDER — MACITENTAN 10 MG PO TABS
10.0000 mg | ORAL_TABLET | Freq: Every day | ORAL | Status: DC
  Administered 2019-08-17: 10:00:00 10 mg via ORAL
  Filled 2019-08-17 (×2): qty 1

## 2019-08-17 MED ORDER — SELEXIPAG 200 MCG PO TABS: 400.0000 ug | ORAL_TABLET | Freq: Two times a day (BID) | ORAL | Status: DC

## 2019-08-17 MED ORDER — POLYETHYLENE GLYCOL 3350 17 G PO PACK
17.0000 g | PACK | Freq: Every day | ORAL | Status: DC
  Administered 2019-08-17: 18:00:00 17 g via ORAL
  Filled 2019-08-17: qty 1

## 2019-08-17 MED ORDER — HEPARIN BOLUS VIA INFUSION
2000.0000 [IU] | Freq: Once | INTRAVENOUS | Status: AC
  Administered 2019-08-17: 13:00:00 2000 [IU] via INTRAVENOUS
  Filled 2019-08-17: qty 2000

## 2019-08-17 MED ORDER — MIDODRINE HCL 5 MG PO TABS
10.0000 mg | ORAL_TABLET | Freq: Three times a day (TID) | ORAL | Status: DC
  Administered 2019-08-17: 18:00:00 10 mg via ORAL
  Filled 2019-08-17: qty 2

## 2019-08-17 MED ORDER — VANCOMYCIN HCL 750 MG/150ML IV SOLN
750.0000 mg | INTRAVENOUS | Status: DC
  Administered 2019-08-18: 07:00:00 750 mg via INTRAVENOUS
  Filled 2019-08-17: qty 150

## 2019-08-17 MED ORDER — TADALAFIL (PAH) 20 MG PO TABS
40.0000 mg | ORAL_TABLET | Freq: Every day | ORAL | Status: DC
  Administered 2019-08-17: 10:00:00 40 mg via ORAL
  Filled 2019-08-17 (×2): qty 2

## 2019-08-17 MED ORDER — METHYLPREDNISOLONE SODIUM SUCC 125 MG IJ SOLR
60.0000 mg | Freq: Four times a day (QID) | INTRAMUSCULAR | Status: DC
  Administered 2019-08-17: 10:00:00 60 mg via INTRAVENOUS
  Filled 2019-08-17: qty 2

## 2019-08-17 MED ORDER — IPRATROPIUM BROMIDE 0.02 % IN SOLN
0.5000 mg | Freq: Three times a day (TID) | RESPIRATORY_TRACT | Status: DC
  Administered 2019-08-18: 09:00:00 0.5 mg via RESPIRATORY_TRACT
  Filled 2019-08-17: qty 2.5

## 2019-08-17 MED ORDER — ACETAMINOPHEN 325 MG PO TABS
650.0000 mg | ORAL_TABLET | Freq: Four times a day (QID) | ORAL | Status: DC | PRN
  Administered 2019-08-18: 02:00:00 650 mg via ORAL
  Filled 2019-08-17: qty 2

## 2019-08-17 MED ORDER — IPRATROPIUM BROMIDE 0.02 % IN SOLN
0.5000 mg | Freq: Four times a day (QID) | RESPIRATORY_TRACT | Status: DC
  Administered 2019-08-17 (×4): 0.5 mg via RESPIRATORY_TRACT
  Filled 2019-08-17 (×4): qty 2.5

## 2019-08-17 MED ORDER — MIDODRINE HCL 5 MG PO TABS
5.0000 mg | ORAL_TABLET | Freq: Three times a day (TID) | ORAL | Status: DC
  Administered 2019-08-17 (×2): 5 mg via ORAL
  Filled 2019-08-17 (×4): qty 1

## 2019-08-17 MED ORDER — SODIUM CHLORIDE 0.9 % IV SOLN
2.0000 g | Freq: Every day | INTRAVENOUS | Status: DC
  Administered 2019-08-17 – 2019-08-18 (×2): 2 g via INTRAVENOUS
  Filled 2019-08-17 (×2): qty 2

## 2019-08-17 MED ORDER — VANCOMYCIN HCL 1250 MG/250ML IV SOLN
1250.0000 mg | Freq: Once | INTRAVENOUS | Status: AC
  Administered 2019-08-17: 05:00:00 1250 mg via INTRAVENOUS
  Filled 2019-08-17: qty 250

## 2019-08-17 MED ORDER — ACETAMINOPHEN 650 MG RE SUPP: 650.0000 mg | Freq: Four times a day (QID) | RECTAL | Status: DC | PRN

## 2019-08-17 MED ORDER — HEPARIN (PORCINE) 25000 UT/250ML-% IV SOLN
1500.0000 [IU]/h | INTRAVENOUS | Status: DC
  Administered 2019-08-17: 22:00:00 1500 [IU]/h via INTRAVENOUS
  Administered 2019-08-17: 01:00:00 1100 [IU]/h via INTRAVENOUS
  Filled 2019-08-17 (×2): qty 250

## 2019-08-17 MED ORDER — HEPARIN BOLUS VIA INFUSION
4000.0000 [IU] | Freq: Once | INTRAVENOUS | Status: AC
  Administered 2019-08-17: 01:00:00 4000 [IU] via INTRAVENOUS
  Filled 2019-08-17: qty 4000

## 2019-08-17 MED ORDER — BUDESONIDE 0.5 MG/2ML IN SUSP
0.5000 mg | Freq: Two times a day (BID) | RESPIRATORY_TRACT | Status: DC
  Administered 2019-08-17 – 2019-08-18 (×3): 0.5 mg via RESPIRATORY_TRACT
  Filled 2019-08-17 (×5): qty 2

## 2019-08-17 MED ORDER — ARFORMOTEROL TARTRATE 15 MCG/2ML IN NEBU
15.0000 ug | INHALATION_SOLUTION | Freq: Two times a day (BID) | RESPIRATORY_TRACT | Status: DC
  Administered 2019-08-17 – 2019-08-18 (×3): 15 ug via RESPIRATORY_TRACT
  Filled 2019-08-17 (×5): qty 2

## 2019-08-17 MED ORDER — IPRATROPIUM BROMIDE 0.02 % IN SOLN
RESPIRATORY_TRACT | Status: AC
  Filled 2019-08-17: qty 2.5

## 2019-08-17 NOTE — Progress Notes (Signed)
[place] for Heparin  Indication: pulmonary embolus  Allergies  Allergen Reactions  . Penicillins Hives    Has patient had a PCN reaction causing immediate rash, facial/tongue/throat swelling, SOB or lightheadedness with hypotension: No Has patient had a PCN reaction causing severe rash involving mucus membranes or skin necrosis: Yes Has patient had a PCN reaction that required hospitalization Unknown Has patient had a PCN reaction occurring within the last 10 years: No If all of the above answers are "NO", then may proceed with Cephalosporin use.   . Nitrates, Organic   . Symbicort [Budesonide-Formoterol Fumarate] Other (See Comments) and Cough    Severe cough    Patient Measurements: 68 kg  Vital Signs: Temp: 97.5 F (36.4 C) (12/17 1653) Temp Source: Oral (12/17 1653) BP: 84/61 (12/17 1653) Pulse Rate: 104 (12/17 1653)  Labs: Recent Labs    08/27/2019 1641 08/25/2019 2319 08/17/19 0104 08/17/19 0358 08/17/19 0420 08/17/19 0554 08/17/19 1026 08/17/19 2000  HGB 15.8 16.7  --   --  15.3 14.0  --   --   HCT 47.8 49.0  --   --  45.0 42.6  --   --   PLT PLATELET CLUMPS NOTED ON SMEAR, UNABLE TO ESTIMATE  --   --   --   --  97*  --   --   HEPARINUNFRC  --   --  <0.10*  --   --   --  0.10* 0.23*  CREATININE  --  2.20*  --  2.18*  --  2.06*  --   --     Estimated Creatinine Clearance: 30.7 mL/min (A) (by C-G formula based on SCr of 2.06 mg/dL (H)).   Medical History: Past Medical History:  Diagnosis Date  . CAD (coronary artery disease)   . GAD (generalized anxiety disorder)   . Hyperlipidemia   . Overweight(278.02)   . Pulmonary hypertension [facility])     Assessment: 73 y/o M with persistent hypoxemia and hypotension. Hx pulmonary hypertension. To begin empiric treatment with heparin while ruling out PE.  Heparin level is sub-therapeutic but increasing at 0.23 after small bolus of 2000 units and increase to 1300 units/hr. Hgb 14,  plt are low at 97 (monitor closely). No s/sx of bleeding or infusion issues.   VQ scan is positive for PE.   Goal of Therapy:  Heparin level 0.3-0.7 units/ml Monitor platelets by anticoagulation protocol: Yes   Plan:   Increase heparin drip to 1500 units/hr Follow-up AM level   Sloan Leiter, PharmD, BCPS, BCCCP Clinical Pharmacist Please refer to [facility] for [place] numbers 08/17/2019, 8:47 PM

## 2019-08-17 NOTE — Progress Notes (Signed)
NAME:  [name], MRN:  [id number], DOB:  [birth year], LOS: 0 ADMISSION DATE:  08/09/2019, CONSULTATION DATE:  08/17/19 REFERRING MD:  Marlowe Sax  CHIEF COMPLAINT:  Dyspnea   Brief History   [name] is a 73 y.o. male who was admitted 12/17 with acute on chronic hypoxic respiratory failure.  History of present illness   [name] is a 73 y.o. 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 Lake Bells locally as well as [facility]), CHF, HLD, Hypotension (see "past medical history" for rest).  He presented to [facility] ED 12/17 with dyspnea.  Had recent admission 08/08/19 through 08/10/19 for dyspnea and fall.  He suffered right subcapital femur fx and was evaluated by ortho who recommended conservative management.  LE dopplers were negative for PE at the time.  He was admitted and continued on home [place] therapies and was   Discharged to [place].  Returns to ED 12/17 with worsening dyspnea.  CXR with cardiomegaly and pulmonary vascular congestion.  He was placed on NRB and ABG demonstrated significant hypoxemic respiratory failure (7.4 / 28 / 43).  BNP 3,477.  COVID pending. He was placed on BiPAP due to hypoxia and his work of breathing.  Of note, had right femur fx noted on prior admission with negative dopplers at the time.  Despite negative dopplers then, it is worrisome that he could have developed DVT / PE in the interim.  COVID also of consideration especially in light of discharge to facility.  PCCM asked to evaluate due to hypoxia and hypotension.  After we were consulted, we confirmed full DNR with pt.  This is well documented from prior admission as well.  Past Medical History  has CAD (coronary artery disease); GAD (generalized anxiety disorder); Hyperlipidemia; Overweight(278.02); Essential hypertension, benign; Dyspnea on exertion; Acute stress disorder; Allergic  rhinitis due to pollen; Angina pectoris ([place]); Anxiety; Cardiac arrhythmia; Insomnia; Low back pain; Obesity; Raynaud phenomenon; Seborrheic dermatitis; Umbilical hernia; Centrilobular emphysema ([place]); Pulmonary hypertension ([place]); Chronic respiratory failure with hypoxia ([place]); Protein calorie malnutrition ([place]); Thrombocytopenia ([place]); Acute diastolic CHF (congestive heart failure) ([place]); Diastolic heart failure ([place]); Cor pulmonale (chronic) ([place]); Goals of care, counseling/discussion; Hypotension; AKI (acute kidney injury) ([place]); Acute exacerbation of CHF (congestive heart failure) ([place]); Acute on chronic respiratory failure with hypoxia (H[facility]); COPD (chronic obstructive pulmonary disease) ([place]); and CKD (chronic kidney disease) stage 3, GFR 30-59 ml/min on their problem list.  Significant Hospital Events   12/17 > admit.  Consults:  PCCM.  Procedures:  None.  Significant Diagnostic Tests:  CXR 12/17 > CM, vascular congestion.  Micro Data:  SARS CoV2 12/17 >   Antimicrobials:  None.   Interim history/subjective:  No longer on BiPAP Currently on her percent nonrebreather mask, saturations ranging 82-90% Notes that his breathing is somewhat improved.  He did receive empiric diuresis on presentation but then got some fluid back due to relative hypotension  Objective:  Blood pressure (!) 84/71, pulse (!) 105, temperature 98 F (36.7 C), temperature source Axillary, resp. rate 13, SpO2 95 %.    FiO2 (%):  [100 %] 100 %   Intake/Output Summary (Last 24 hours) at 08/17/2019 1054 Last data filed at 08/17/2019 7341 Gross per 24 hour  Intake 250.05 ml  Output 1000 ml  Net -749.95 ml   There were no vitals filed for this  visit.  Examination: General: Thin ill-appearing man, high FiO2, no respiratory distress Neuro: Awake, answers questions but very slow to respond, having some word finding and memory difficulty.  Moves all extremities HEENT: Oropharynx dry, no  lesions Cardiovascular: Regular, distant, no murmur Lungs: Very distant, clear bilaterally without wheezes or crackles Abdomen: Nondistended, positive bowel sounds Musculoskeletal: 1+ pretibial edema Skin: Some erythema on the face from pressure from BiPAP without any overt breakdown  Assessment & Plan:   Acute on chronic hypoxic respiratory failure - unclear etiology for cause of acute exacerbation, but PE high on differential given recent right femur fx.  COVID testing negative and no history of viral prodrome. -We will continue to use high FiO2, BiPAP if he can tolerate and if beneficial -At this point agree with empiric heparin given the risk for DVT/PE.  If his lower extremity Doppler study is negative then could consider discontinuation.  Not in a position to undergo CT-PA.  Not clear to me that a VQ scan would be very useful given his profound parenchymal lung disease -Agree with empiric steroids although no clear evidence for acute exacerbation COPD at this time.  No history of underlying interstitial lung disease -Support diuresis as he can tolerate, suspect that there is a component of decompensated right heart failure due to his secondary pulmonary hypertension. 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 -Would continue his home macitentan, selexepag, tadalafil  Hx COPD   no clear evidence AE on exam or history. Low threshold to d/c steroids as he stabilizes -BD as ordered  Chronic hypotension. -home midodrine -careful with diuresis -not clear to me given the severity of his disease that he would be a good candidate for any vasoactive infusions. Based on his [place], code status would defer.   CHF exac - volume overloaded on CXR and exam, but lungs suprisingly clear. -diuresis as he can tolerate. Suspect that cor pulmonale is the etiology here   Best Practice:  Diet: NPO. Pain/Anxiety/Delirium protocol (if indicated): N/A. VAP protocol (if  indicated): N/A. DVT prophylaxis: Heparin gtt. GI prophylaxis: N/A. Glucose control: SSI if glucose consistently > 180. Mobility: Bedrest. Code Status: DNR, confirmed with pt. Family Communication:  Disposition: SDU.  Labs   CBC: Recent Labs  Lab 08/29/2019 1641 08/17/2019 2118 08/01/2019 2319 08/17/19 0420 08/17/19 0554  WBC 8.7  --   --   --  5.8  NEUTROABS 7.4  --   --   --   --   HGB 15.8 16.7 16.7 15.3 14.0  HCT 47.8 49.0 49.0 45.0 42.6  MCV 92.5  --   --   --  93.4  PLT PLATELET CLUMPS NOTED ON SMEAR, UNABLE TO ESTIMATE  --   --   --  97*   Basic Metabolic Panel: Recent Labs  Lab 08/28/2019 2118 08/26/2019 2319 08/17/19 0358 08/17/19 0420 08/17/19 0554  NA 136 136 139 138 136  K 4.1 4.2 4.4 4.1 4.3  CL  --  108 109  --  110  CO2  --   --  16*  --  15*  GLUCOSE  --  110* 122*  --  122*  BUN  --  90* 100*  --  101*  CREATININE  --  2.20* 2.18*  --  2.06*  CALCIUM  --   --  8.0*  --  7.7*  MG  --   --   --   --  2.7*   GFR:  Estimated Creatinine Clearance: 30.7 mL/min (A) (by C-G formula based on SCr of 2.06 mg/dL (H)). Recent Labs  Lab 08/30/2019 1641 08/17/19 0358 08/17/19 0554  PROCALCITON  --  0.25  --   WBC 8.7  --  5.8   Liver Function Tests: Recent Labs  Lab 08/17/19 0554  AST 25  ALT 21  ALKPHOS 86  BILITOT 1.2  PROT 4.8*  ALBUMIN 2.4*   No results for input(s): LIPASE, AMYLASE in the last 168 hours. No results for input(s): AMMONIA in the last 168 hours. ABG    Component Value Date/Time   PHART 7.373 08/17/2019 0420   PCO2ART 28.9 (L) 08/17/2019 0420   PO2ART 64.0 (L) 08/17/2019 0420   HCO3 16.9 (L) 08/17/2019 0420   TCO2 18 (L) 08/17/2019 0420   ACIDBASEDEF 7.0 (H) 08/17/2019 0420   O2SAT 92.0 08/17/2019 0420    Coagulation Profile: No results for input(s): INR, PROTIME in the last 168 hours. Cardiac Enzymes: No results for input(s): CKTOTAL, CKMB, CKMBINDEX, TROPONINI in the last 168 hours. HbA1C: No results found for:  HGBA1C CBG: No results for input(s): GLUCAP in the last 168 hours.   Baltazar Apo, MD, PhD 08/17/2019, 11:05 AM [facility] [phone] or if no answer [phone]

## 2019-08-17 NOTE — Progress Notes (Signed)
[place] for Heparin  Indication: Rule out PE  Allergies  Allergen Reactions  . Penicillins Hives    Has patient had a PCN reaction causing immediate rash, facial/tongue/throat swelling, SOB or lightheadedness with hypotension: No Has patient had a PCN reaction causing severe rash involving mucus membranes or skin necrosis: Yes Has patient had a PCN reaction that required hospitalization Unknown Has patient had a PCN reaction occurring within the last 10 years: No If all of the above answers are "NO", then may proceed with Cephalosporin use.   . Nitrates, Organic   . Symbicort [Budesonide-Formoterol Fumarate] Other (See Comments) and Cough    Severe cough    Patient Measurements: 68 kg  Vital Signs: Temp: 98 F (36.7 C) (12/17 0151) Temp Source: Axillary (12/17 0151) BP: 78/68 (12/17 1015) Pulse Rate: 100 (12/17 1015)  Labs: Recent Labs    08/08/2019 1641 08/01/2019 2319 08/17/19 0104 08/17/19 0358 08/17/19 0420 08/17/19 0554 08/17/19 1026  HGB 15.8 16.7  --   --  15.3 14.0  --   HCT 47.8 49.0  --   --  45.0 42.6  --   PLT PLATELET CLUMPS NOTED ON SMEAR, UNABLE TO ESTIMATE  --   --   --   --  97*  --   HEPARINUNFRC  --   --  <0.10*  --   --   --  0.10*  CREATININE  --  2.20*  --  2.18*  --  2.06*  --     Estimated Creatinine Clearance: 30.7 mL/min (A) (by C-G formula based on SCr of 2.06 mg/dL (H)).   Medical History: Past Medical History:  Diagnosis Date  . CAD (coronary artery disease)   . GAD (generalized anxiety disorder)   . Hyperlipidemia   . Overweight(278.02)   . Pulmonary hypertension [facility])     Assessment: 73 y/o M with persistent hypoxemia and hypotension. Hx pulmonary hypertension. To begin empiric treatment with heparin while ruling out PE.  Heparin level this morning came back subtherapeutic at 0.1, on 1100 units/hr. Hgb 14, plt 97 (monitor closely). No s/sx of bleeding or infusion issues.   Goal of  Therapy:  Heparin level 0.3-0.7 units/ml Monitor platelets by anticoagulation protocol: Yes   Plan:  Heparin 2000 units BOLUS Increase heparin drip to1300 units/hr Order heparin level in 8 hours Daily CBC/HL, s/sx of bleeding F/u results from VQ scan and duplex   Antonietta Jewel, PharmD, BCCCP Clinical Pharmacist  Phone: [phone]  Please check AMION for all [place] phone numbers After 10:00 PM, call [place] [phone]

## 2019-08-17 NOTE — Progress Notes (Signed)
Pt taken off Bipap and placed on 100% NRB. Pt tolerating well at this time.  RT will continue to monitor.

## 2019-08-17 NOTE — ED Notes (Signed)
Admitting paged. In chart review, patient was noted to have preserved ejection fraction >60% as of last ECHO earlier this month. Due to refractory hypotension secondary to patient's diastolic heart failure with preserved ejection fraction, concern for need for vasopressor therapy was expressed. [facility] floor coverage NP advised they would review chart and advise further.

## 2019-08-17 NOTE — Progress Notes (Addendum)
Patient 73 year old gentleman with history of end-stage group 1 and 3 pulmonary arterial hypertension, COPD emphysema on 2 L of oxygen at home, chronic diastolic dysfunction and hyperlipidemia who is admitted to the hospital with hypotension and hypoxia.  He lives in assisted living facility.  Initially reported 50% on 6 L oxygen by nasal cannula.  Recently admitted for dyspnea and fall.  Recently suffered from right subcapital femoral fracture and recommended conservative management.  COVID-19 test was negative.  Initially required 40 L oxygen by high flow nasal cannula.  BiPAP needed and currently remains on 100% nonrebreather. Patient seen in emergency room.  Remains in very poor clinical status.  Still on 100% nonrebreather.  He feels okay at rest.  Plan: Acute hypoxemic respiratory failure with history of chronic hypoxemic respiratory failure due to cor pulmonale: Cor pulmonale exacerbation. Empirically treating with IV antibiotics for both gram-positive and gram-negative bacteria with recent hospitalization. Empirically treating with heparin infusion, unable to do CTA due to renal functions.  Lower extremity duplexes negative. Patient is DNR/DNI, against the CPR and mechanical ventilation. No room for diuresis with low blood pressures.  Will increase dose of his midodrine to see if that helps to improve blood pressure and can use some diuretics. Patient is probably end-of-life if no adequate improvement, will involve palliative care. I called and discussed case with patient's wife in details, she is going to visit him in the hospital. Prognosis remains guarded due to severe cor pulmonale. Symptomatic management with steroid inhalers, Adcirca and selexipag.  4 PM Patient reevaluated.  He arrived.  Medical floor.  Blood pressures low but is stable.  Most of the readings are more than 90.  He was 100% saturating on nonrebreather, will decrease oxygen flow to keep saturations 88-90%.  He was able  to talk in full sentences and describe symptoms.  Feels somehow comfortable. I will discuss with the heart failure team if there is anything to offer to him. Still there is no room for diuretics because of low blood pressure and renal functions.  Called by radiology with large perfusion defect right upper lobe not present on previous exams. Will treat as acute PE. Already on heparin.

## 2019-08-17 NOTE — Consult Note (Signed)
NAME:  [name], MRN:  [id number], DOB:  1946-03-15, LOS: 0 ADMISSION DATE:  08/25/2019, CONSULTATION DATE:  08/17/19 REFERRING MD:  Marlowe Sax  CHIEF COMPLAINT:  Dyspnea   Brief History   [name] is a 73 y.o. male who was admitted 12/17 with acute on chronic hypoxic respiratory failure.  History of present illness   [name] is a 72 y.o. 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 Lake Bells locally as well as [facility]), CHF, HLD, Hypotension (see "past medical history" for rest).  He presented to [facility] ED 12/17 with dyspnea.  Had recent admission 08/08/19 through 08/10/19 for dyspnea and fall.  He suffered right subcapital femur fx and was evaluated by ortho who recommended conservative management.  LE dopplers were negative for PE at the time.  He was admitted and continued on home [place] therapies and was   Discharged to [facility].  Returns to ED 12/17 with worsening dyspnea.  CXR with cardiomegaly and pulmonary vascular congestion.  He was placed on NRB and ABG demonstrated significant hypoxemic respiratory failure (7.4 / 28 / 43).  BNP 3,477.  COVID pending. He was placed on BiPAP due to hypoxia and his work of breathing.  Of note, had right femur fx noted on prior admission with negative dopplers at the time.  Despite negative dopplers then, it is worrisome that he could have developed DVT / PE in the interim.  COVID also of consideration especially in light of discharge to facility.  PCCM asked to evaluate due to hypoxia and hypotension.  After we were consulted, we confirmed full DNR with pt.  This is well documented from prior admission as well.  Past Medical History  has CAD (coronary artery disease); GAD (generalized anxiety disorder); Hyperlipidemia; Overweight(278.02); Essential hypertension, benign; Dyspnea on exertion; Acute stress disorder; Allergic  rhinitis due to pollen; Angina pectoris ([place]); Anxiety; Cardiac arrhythmia; Insomnia; Low back pain; Obesity; Raynaud phenomenon; Seborrheic dermatitis; Umbilical hernia; Centrilobular emphysema ([place]); Pulmonary hypertension ([place]); Chronic respiratory failure with hypoxia ([place]); Protein calorie malnutrition ([place]); Thrombocytopenia ([place]); Acute diastolic CHF (congestive heart failure) ([place]); Diastolic heart failure ([place]); Cor pulmonale (chronic) ([place]); Goals of care, counseling/discussion; Hypotension; and Acute kidney injury superimposed on CKD ([place]) on their problem list.  [facility]   12/17 > admit.  Consults:  PCCM.  Procedures:  None.  Significant Diagnostic Tests:  CXR 12/17 > CM, vascular congestion.  Micro Data:  SARS CoV2 12/17 >   Antimicrobials:  None.   Interim history/subjective:  Comfortable on BiPAP.  Objective:  Blood pressure (!) 98/59, pulse (!) 48, resp. rate 13, SpO2 92 %.    FiO2 (%):  [100 %] 100 %  No intake or output data in the 24 hours ending 08/17/19 0034 There were no vitals filed for this visit.  Examination: General: Adult male, chronically ill appearing, in NAD. Neuro: A&O x 3, no deficits. HEENT: [place]/AT. Sclerae anicteric.  EOMI.  BiPAP in place. Cardiovascular: RRR, no M/R/G.  Lungs: Respirations even and unlabored.  CTA bilaterally, No W/R/R.  Abdomen: BS x 4, soft, NT/ND.  Musculoskeletal: No gross deformities, 1+ edema.  Skin: Intact, warm, no rashes.  Assessment & Plan:   Acute on chronic hypoxic respiratory failure - unclear etiology for cause of acute exacerbation, but PE high on differential given recent right femur fx.  COVID should also be on high on list given his recent discharge to group home / independent living.  Of course, he has very poor reserve given his COPD and end stage PAH at baseline. - Continue BiPAP on PRN basis. - Continue supplemental O2 as needed to maintain SpO2 > 88%. - Would empirically treat  with heparin gtt for now and can d/c if LE dopplers are neg. - Assess LE duplex. - CTA chest restricted due to AKI. - F/u on COVID. - DNR / DNI confirmed with pt.  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 - Continue home macitentan, tadalafil, uptravi. - F/u as outpatient and with [facility].  Hx COPD - no evidence of exacerbation. - Continue BD's. - F/u as outpatient.  Chronic hypotension. - Continue home midodrine.  CHF exac - volume overloaded on CXR and exam, but lungs suprisingly clear. - Lasix as BP and SCr permit. - Continue BiPAP.  Rest per primary team.  DNR / DNI confirmed with pt.  Not much else to offer here.  PCCM will sign off.  Please do not hesitate to call us back if we can be of any further assistance.   Best Practice:  Diet: NPO. Pain/Anxiety/Delirium protocol (if indicated): N/A. VAP protocol (if indicated): N/A. DVT prophylaxis: Heparin gtt. GI prophylaxis: N/A. Glucose control: SSI if glucose consistently > 180. Mobility: Bedrest. Code Status: DNR, confirmed with pt. Family Communication: None available. Disposition: SDU.  Labs   CBC: Recent Labs  Lab 08/09/2019 1614 08/07/2019 1641 08/27/2019 2118 08/01/2019 2319  WBC  --  8.7  --   --   NEUTROABS  --  7.4  --   --   HGB 16.7 15.8 16.7 16.7  HCT 49.0 47.8 49.0 49.0  MCV  --  92.5  --   --   PLT  --  PLATELET CLUMPS NOTED ON SMEAR, UNABLE TO ESTIMATE  --   --    Basic Metabolic Panel: Recent Labs  Lab 08/10/19 0258 08/29/2019 1614 08/08/2019 2118 08/02/2019 2319  NA 136 135 136 136  K 5.4* 4.0 4.1 4.2  CL 101  --   --  108  CO2 21*  --   --   --   GLUCOSE 97  --   --  110*  BUN 74*  --   --  90*  CREATININE 2.51*  --   --  2.20*  CALCIUM 8.4*  --   --   --    GFR: Estimated Creatinine Clearance: 28.8 mL/min (A) (by C-G formula based on SCr of 2.2 mg/dL (H)). Recent Labs  Lab 08/10/19 0258 08/11/2019 1641  PROCALCITON 1.14  --   WBC  --  8.7   Liver Function Tests: No results for input(s): AST, ALT,  ALKPHOS, BILITOT, PROT, ALBUMIN in the last 168 hours. No results for input(s): LIPASE, AMYLASE in the last 168 hours. No results for input(s): AMMONIA in the last 168 hours. ABG    Component Value Date/Time   PHART 7.400 08/13/2019 2118   PCO2ART 28.0 (L) 08/17/2019 2118   PO2ART 43.0 (L) 08/23/2019 2118   HCO3 17.3 (L) 08/29/2019 2118   TCO2 17 (L) 08/28/2019 2319   ACIDBASEDEF 6.0 (H) 08/07/2019 2118   O2SAT 80.0 08/27/2019 2118    Coagulation Profile: No results for input(s): INR, PROTIME in the last 168 hours. Cardiac Enzymes: No results for input(s): CKTOTAL, CKMB, CKMBINDEX, TROPONINI in the last 168 hours. HbA1C: No results found for: HGBA1C CBG: No results for input(s):  GLUCAP in the last 168 hours.  Review of Systems:   All negative; except for those that are bolded, which indicate positives.  Constitutional: weight loss, weight gain, night sweats, fevers, chills, fatigue, weakness.  HEENT: headaches, sore throat, sneezing, nasal congestion, post nasal drip, difficulty swallowing, tooth/dental problems, visual complaints, visual changes, ear aches. Neuro: difficulty with speech, weakness, numbness, ataxia. CV:  chest pain, orthopnea, PND, swelling in lower extremities, dizziness, palpitations, syncope.  Resp: cough, hemoptysis, dyspnea, wheezing. GI: heartburn, indigestion, abdominal pain, nausea, vomiting, diarrhea, constipation, change in bowel habits, loss of appetite, hematemesis, melena, hematochezia.  GU: dysuria, change in color of urine, urgency or frequency, flank pain, hematuria. MSK: joint pain or swelling, decreased range of motion. Psych: change in mood or affect, depression, anxiety, suicidal ideations, homicidal ideations. Skin: rash, itching, bruising.   Past medical history  He,  has a past medical history of CAD (coronary artery disease), GAD (generalized anxiety disorder), Hyperlipidemia, Overweight(278.02), and Pulmonary hypertension ([place]).    Surgical History    Past Surgical History:  Procedure Laterality Date  . ANAL FISSURE REPAIR    . CARDIAC CATHETERIZATION  2005  . RIGHT/LEFT HEART CATH AND CORONARY ANGIOGRAPHY N/A 11/24/2016   Procedure: Right/Left Heart Cath and Coronary Angiography;  Surgeon: Jettie Booze, MD;  Location: [facility];  Service: Cardiovascular;  Laterality: N/A;  . SKIN CANCER EXCISION    . TONSILLECTOMY  1955  . UMBILICAL HERNIA REPAIR  10/07/2011   Procedure: HERNIA REPAIR UMBILICAL ADULT;  Surgeon: Rolm Bookbinder, MD;  Location: [facility];  Service: General;  Laterality: N/A;  with mesh      Social History   reports that he quit smoking about 33 years ago. He has a 18.00 pack-year smoking history. He has never used smokeless tobacco. He reports current alcohol use. He reports that he does not use drugs.   Family history   His family history includes Cancer in his sister; Heart attack in his father; Heart disease in his father; Stroke in his father.   Allergies Allergies  Allergen Reactions  . Penicillins Hives    Has patient had a PCN reaction causing immediate rash, facial/tongue/throat swelling, SOB or lightheadedness with hypotension: No Has patient had a PCN reaction causing severe rash involving mucus membranes or skin necrosis: Yes Has patient had a PCN reaction that required hospitalization Unknown Has patient had a PCN reaction occurring within the last 10 years: No If all of the above answers are "NO", then may proceed with Cephalosporin use.   . Nitrates, Organic   . Symbicort [Budesonide-Formoterol Fumarate] Other (See Comments) and Cough    Severe cough     Home meds  Prior to Admission medications   Medication Sig Start Date End Date Taking? Authorizing Provider  ammonium lactate (AMLACTIN) 12 % cream Apply 1 application topically daily as needed for dry skin. 05/16/19   [provider]  aspirin 81 MG tablet Take 81 mg by mouth every evening.      [provider]  Azelastine-Fluticasone (DYMISTA) 137-50 MCG/ACT SUSP Place 1-2 sprays into the nose daily as needed. Patient taking differently: Place 1-2 sprays into the nose daily as needed (allergies).  12/08/18   Parrett, Fonnie Mu, NP  clonazePAM (KLONOPIN) 0.5 MG tablet Take 1 tablet (0.5 mg total) by mouth 3 (three) times daily as needed for up to 15 doses for anxiety. 08/10/19   Amin, Ankit Chirag, MD  Coenzyme Q10 (VITALINE COQ10) 100 MG WAFR Take 60 mg  by mouth daily.    [provider]  DULoxetine (CYMBALTA) 60 MG capsule Take 60 mg by mouth daily.    [provider]  feeding supplement (BOOST / RESOURCE BREEZE) LIQD Take 1 Container by mouth 3 (three) times daily between meals.    [provider]  Fluticasone-Umeclidin-Vilant (TRELEGY ELLIPTA) 100-62.5-25 MCG/INH AEPB Inhale 1 puff into the lungs daily. 06/14/19   Parrett, Tammy S, NP  ipratropium (ATROVENT) 0.06 % nasal spray Place 2 sprays into both nostrils 4 (four) times daily as needed for rhinitis. 04/19/18   Juanito Doom, MD  Lactobacillus (ACIDOPHILUS PO) Take 1 capsule by mouth daily.     [provider]  Macitentan (OPSUMIT) 10 MG TABS Take 1 tablet (10 mg total) by mouth daily. 01/21/17   Bensimhon, Shaune Pascal, MD  midodrine (PROAMATINE) 10 MG tablet Take 5 mg by mouth 3 (three) times daily.     [provider]  mirtazapine (REMERON) 15 MG tablet Take 15 mg by mouth at bedtime.    [provider]  Multiple Vitamins-Minerals (CENTRUM CARDIO PO) Take 1 tablet by mouth daily.     [provider]  rosuvastatin (CRESTOR) 40 MG tablet Take 1 tablet (40 mg total) by mouth daily. Please make overdue appt with Dr. Irish Lack before anymore refills. 2nd attempt 07/07/18   Jettie Booze, MD  tadalafil (CIALIS) 20 MG tablet Take 40 mg by mouth daily.     [provider]  triamcinolone cream (KENALOG) 0.1 % Apply 1 application topically daily as needed for  irritation. 05/16/19   [provider]  tuberculin (TUBERSOL) 5 UNIT/0.1ML injection Inject into the skin once. Once A Day Every 14 Days 08/10/19 08/24/19  [provider]  UPTRAVI 200 MCG TABS Take 400-600 mcg by mouth 2 (two) times daily. Take 2 tablets in the morning and 3 tablets at night 06/19/19   [provider]  zinc oxide 20 % ointment Apply 1 application topically. To buttocks after every incontinent episode and as needed for redness. May keep at bedside. As Needed    [provider]     Montey Hora, [facility] 08/17/2019, 12:34 AM

## 2019-08-17 NOTE — Progress Notes (Signed)
Bilateral lower extremity venous duplex completed. Refer to "CV Proc" under chart review to view preliminary results.  08/17/2019 12:16 PM Kelby Aline., MHA, RVT, RDCS, RDMS

## 2019-08-17 NOTE — Progress Notes (Signed)
RT switched patient from NRB to [place].  Patient satting 92 RR 15.  Patient not in distress seems more comfortable.  RT will continue to monitor.  BiPAP at bedside if needed.

## 2019-08-17 NOTE — Progress Notes (Signed)
Pharmacy Antibiotic Note  [name] is a 73 y.o. male admitted on 08/12/2019 with SOB, possible HCAP.  Pharmacy has been consulted for Vancomycin and Cefepime  dosing.  Plan: Vancomycin 1250 mg IV now, then 750 mg IV q24h Est AUC 542 Cefepime 2 g IV q24h     Temp (24hrs), Avg:98.1 F (36.7 C), Min:98 F (36.7 C), Max:98.2 F (36.8 C)  Recent Labs  Lab 08/10/2019 1641 08/05/2019 2319  WBC 8.7  --   CREATININE  --  2.20*    Estimated Creatinine Clearance: 28.8 mL/min (A) (by C-G formula based on SCr of 2.2 mg/dL (H)).    Allergies  Allergen Reactions  . Penicillins Hives    Has patient had a PCN reaction causing immediate rash, facial/tongue/throat swelling, SOB or lightheadedness with hypotension: No Has patient had a PCN reaction causing severe rash involving mucus membranes or skin necrosis: Yes Has patient had a PCN reaction that required hospitalization Unknown Has patient had a PCN reaction occurring within the last 10 years: No If all of the above answers are "NO", then may proceed with Cephalosporin use.   . Nitrates, Organic   . Symbicort [Budesonide-Formoterol Fumarate] Other (See Comments) and Cough    Severe cough    Caryl Pina 08/17/2019 3:31 AM

## 2019-08-17 NOTE — H&P (Addendum)
History and Physical    [name] YFV:[id number] DOB: [birth year] DOA: 08/28/2019  PCP: Aaron Cruel, MD Patient coming from: Home  Chief Complaint: SOB  HPI: [name] is a 73 y.o. 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 EMS reported oxygen saturation in the 50s on 6 L oxygen via nasal cannula.  Hypotensive with systolic in the 16B and diastolic in the 84Y.    Patient had a recent admission 12/8-12/10 for dyspnea and fall.  He suffered a right subcapital femur fracture and was evaluated by orthopedics who recommended conservative management.  Lower extremity Dopplers done during that hospitalization are negative for PE. Work-up in the ED revealed significantly elevated BNP at 3447 and pulmonary edema on chest x-ray.    SARS-CoV-2 antigen and PCR test negative.  ABG with evidence of severe hypoxemia (PO2 38).  Patient was placed on 35-40L oxygen via high flow nasal cannula as he initially refused BiPAP.  Repeat ABG with PO2 43.  Patient received 1 L normal saline bolus in the ED and was subsequently given IV Lasix 40 mg and dexamethasone 10 mg.  Patient continued to be persistently hypotensive with systolic in the 65L.  Oxygen saturation persistently in the 60s to 70s on 40 L oxygen via high flow nasal cannula.    Need for BiPAP was discussed with the patient again and he agreed.  Patient was seen by PCCM.  History limited as patient was placed on BiPAP for his respiratory distress.  Reports having chronic shortness of breath and intermittent cough.  Denies fevers, chills, or chest pain.  Denies nausea, vomiting, abdominal pain, diarrhea, or dysuria.  No additional history could be obtained from him  at this time.  Review of Systems:  All systems reviewed and apart from history of presenting illness, are negative.  Past Medical History:  Diagnosis Date  . CAD (coronary artery disease)   . GAD (generalized anxiety disorder)   . Hyperlipidemia   . Overweight(278.02)   . Pulmonary hypertension ([place])     Past Surgical History:  Procedure Laterality Date  . ANAL FISSURE REPAIR    . CARDIAC CATHETERIZATION  2005  . RIGHT/LEFT HEART CATH AND CORONARY ANGIOGRAPHY N/A 11/24/2016   Procedure: Right/Left Heart Cath and Coronary Angiography;  Surgeon: Jettie Booze, MD;  Location: [facility];  Service: Cardiovascular;  Laterality: N/A;  . SKIN CANCER EXCISION    . TONSILLECTOMY  1955  . UMBILICAL HERNIA REPAIR  10/07/2011   Procedure: HERNIA REPAIR UMBILICAL ADULT;  Surgeon: Rolm Bookbinder, MD;  Location: [facility];  Service: General;  Laterality: N/A;  with mesh      reports that he quit smoking about 33 years ago. He has a 18.00 pack-year smoking history. He has never used smokeless tobacco. He reports current alcohol use. He reports that he does not use drugs.  Allergies  Allergen Reactions  . Penicillins Hives    Has patient had a PCN reaction causing immediate rash, facial/tongue/throat swelling, SOB or lightheadedness with hypotension: No Has patient had a PCN reaction causing severe rash involving mucus membranes or skin necrosis: Yes Has patient had a PCN reaction that required hospitalization Unknown Has patient had a PCN reaction occurring within the last 10  years: No If all of the above answers are "NO", then may proceed with Cephalosporin use.   . Nitrates, Organic   . Symbicort [Budesonide-Formoterol Fumarate] Other (See Comments) and Cough    Severe cough    Family History  Problem Relation Age of Onset  . Cancer Sister        breast  . Heart disease Father   . Heart attack Father   . Stroke Father     Prior to Admission medications   Medication Sig  Start Date End Date Taking? Authorizing Provider  ammonium lactate (AMLACTIN) 12 % cream Apply 1 application topically daily as needed for dry skin. 05/16/19   [provider]  aspirin 81 MG tablet Take 81 mg by mouth every evening.     [provider]  Azelastine-Fluticasone (DYMISTA) 137-50 MCG/ACT SUSP Place 1-2 sprays into the nose daily as needed. Patient taking differently: Place 1-2 sprays into the nose daily as needed (allergies).  12/08/18   Parrett, Fonnie Mu, NP  clonazePAM (KLONOPIN) 0.5 MG tablet Take 1 tablet (0.5 mg total) by mouth 3 (three) times daily as needed for up to 15 doses for anxiety. 08/10/19   Amin, Ankit Chirag, MD  Coenzyme Q10 (VITALINE COQ10) 100 MG WAFR Take 60 mg by mouth daily.    [provider]  DULoxetine (CYMBALTA) 60 MG capsule Take 60 mg by mouth daily.    [provider]  feeding supplement (BOOST / RESOURCE BREEZE) LIQD Take 1 Container by mouth 3 (three) times daily between meals.    [provider]  Fluticasone-Umeclidin-Vilant (TRELEGY ELLIPTA) 100-62.5-25 MCG/INH AEPB Inhale 1 puff into the lungs daily. 06/14/19   Parrett, Tammy S, NP  ipratropium (ATROVENT) 0.06 % nasal spray Place 2 sprays into both nostrils 4 (four) times daily as needed for rhinitis. 04/19/18   Juanito Doom, MD  Lactobacillus (ACIDOPHILUS PO) Take 1 capsule by mouth daily.     [provider]  Macitentan (OPSUMIT) 10 MG TABS Take 1 tablet (10 mg total) by mouth daily. 01/21/17   Bensimhon, Shaune Pascal, MD  midodrine (PROAMATINE) 10 MG tablet Take 5 mg by mouth 3 (three) times daily.     [provider]  mirtazapine (REMERON) 15 MG tablet Take 15 mg by mouth at bedtime.    [provider]  Multiple Vitamins-Minerals (CENTRUM CARDIO PO) Take 1 tablet by mouth daily.     [provider]  rosuvastatin (CRESTOR) 40 MG tablet Take 1 tablet (40 mg total) by mouth daily. Please make overdue appt with Dr. Irish Lack  before anymore refills. 2nd attempt 07/07/18   Jettie Booze, MD  tadalafil (CIALIS) 20 MG tablet Take 40 mg by mouth daily.     [provider]  triamcinolone cream (KENALOG) 0.1 % Apply 1 application topically daily as needed for irritation. 05/16/19   [provider]  tuberculin (TUBERSOL) 5 UNIT/0.1ML injection Inject into the skin once. Once A Day Every 14 Days 08/10/19 08/24/19  [provider]  UPTRAVI 200 MCG TABS Take 400-600 mcg by mouth 2 (two) times daily. Take 2 tablets in the morning and 3 tablets at night 06/19/19   [provider]  zinc oxide 20 % ointment Apply 1 application topically. To buttocks after every incontinent episode and as needed for redness. May keep at bedside. As Needed    [provider]    Physical Exam: Vitals:   08/17/19 0030 08/17/19 0100 08/17/19 0130 08/17/19 0151  BP: 90/68 Marland Kitchen)  79/59 (!) 72/53   Pulse: (!) 103 (!) 105 100   Resp: _0 Temp:    98 F (36.7 C)  TempSrc:    Axillary  SpO2: 91% 90% 92%     Physical Exam  Constitutional: He is oriented to person, place, and time. He appears well-developed and well-nourished. No distress.  HENT:  Head: Normocephalic.  Lips, nose, and ears cyanotic  Eyes: Right eye exhibits no discharge. Left eye exhibits no discharge.  Cardiovascular: Normal rate, regular rhythm and intact distal pulses.  Pulmonary/Chest: Breath sounds normal. He is in respiratory distress. He has no wheezes. He has no rales.  On BiPAP  Abdominal: Soft. Bowel sounds are normal. He exhibits no distension. There is no abdominal tenderness. There is no guarding.  Musculoskeletal:        General: No edema.     Cervical back: Neck supple.  Neurological: He is alert and oriented to person, place, and time.  Skin: Skin is warm and dry. He is not diaphoretic.     Labs on Admission: I have personally reviewed following labs and imaging studies  CBC: Recent Labs  Lab  08/02/2019 1614 08/06/2019 1641 08/28/2019 2118 08/24/2019 2319  WBC  --  8.7  --   --   NEUTROABS  --  7.4  --   --   HGB 16.7 15.8 16.7 16.7  HCT 49.0 47.8 49.0 49.0  MCV  --  92.5  --   --   PLT  --  PLATELET CLUMPS NOTED ON SMEAR, UNABLE TO ESTIMATE  --   --    Basic Metabolic Panel: Recent Labs  Lab 08/10/19 0258 08/02/2019 1614 08/31/2019 2118 08/01/2019 2319  NA 136 135 136 136  K 5.4* 4.0 4.1 4.2  CL 101  --   --  108  CO2 21*  --   --   --   GLUCOSE 97  --   --  110*  BUN 74*  --   --  90*  CREATININE 2.51*  --   --  2.20*  CALCIUM 8.4*  --   --   --    GFR: Estimated Creatinine Clearance: 28.8 mL/min (A) (by C-G formula based on SCr of 2.2 mg/dL (H)). Liver Function Tests: No results for input(s): AST, ALT, ALKPHOS, BILITOT, PROT, ALBUMIN in the last 168 hours. No results for input(s): LIPASE, AMYLASE in the last 168 hours. No results for input(s): AMMONIA in the last 168 hours. Coagulation Profile: No results for input(s): INR, PROTIME in the last 168 hours. Cardiac Enzymes: No results for input(s): CKTOTAL, CKMB, CKMBINDEX, TROPONINI in the last 168 hours. BNP (last 3 results) Recent Labs    08/19/18 0936  PROBNP 659.0*   HbA1C: No results for input(s): HGBA1C in the last 72 hours. CBG: No results for input(s): GLUCAP in the last 168 hours. Lipid Profile: No results for input(s): CHOL, HDL, LDLCALC, TRIG, CHOLHDL, LDLDIRECT in the last 72 hours. Thyroid Function Tests: No results for input(s): TSH, T4TOTAL, FREET4, T3FREE, THYROIDAB in the last 72 hours. Anemia Panel: No results for input(s): VITAMINB12, FOLATE, FERRITIN, TIBC, IRON, RETICCTPCT in the last 72 hours. Urine analysis:    Component Value Date/Time   COLORURINE AMBER (A) 08/08/2019 1238   APPEARANCEUR HAZY (A) 08/08/2019 1238   LABSPEC 1.011 08/08/2019 1238   PHURINE 5.0 08/08/2019 1238   GLUCOSEU NEGATIVE 08/08/2019 [place] 08/08/2019 [place] 08/08/2019 1238    KETONESUR  NEGATIVE 08/08/2019 1238   PROTEINUR 100 (A) 08/08/2019 1238   NITRITE NEGATIVE 08/08/2019 1238   LEUKOCYTESUR NEGATIVE 08/08/2019 1238    Radiological Exams on Admission: DG Chest Port 1 View  Result Date: 08/09/2019 CLINICAL DATA:  Hypotension, hypoxia EXAM: PORTABLE CHEST 1 VIEW COMPARISON:  08/08/2019 chest radiograph. FINDINGS: Stable cardiomediastinal silhouette with mild cardiomegaly. No pneumothorax. No pleural effusion. Mild diffuse prominence of the parahilar linear interstitial markings. IMPRESSION: Mild cardiomegaly with new mild diffuse prominence of the linear parahilar interstitial markings, favoring mild cardiogenic pulmonary edema. Electronically Signed   By: Ilona Sorrel M.D.   On: 08/15/2019 17:06    EKG: Independently reviewed.  Sinus rhythm, PVCs.  Assessment/Plan Principal Problem:   Acute on chronic respiratory failure with hypoxia (HCC) Active Problems:   Hypotension   AKI (acute kidney injury) ([place])   Acute exacerbation of CHF (congestive heart failure) (HCC)   COPD (chronic obstructive pulmonary disease) (HCC)   Acute on chronic hypoxic respiratory failure secondary to acute on chronic diastolic congestive heart failure and baseline poor reserve due to COPD and end-stage PAH, concern for possible PE ABG with evidence of severe hypoxemia (PO2 38).  Oxygen saturation persistently in the 60s to 70s on 40 L supplemental oxygen via high flow nasal cannula.  Patient has now been placed on BiPAP.  Oxygen saturation improved to low 90s on BiPAP.  BNP significantly elevated at 3447.  Chest x-ray with evidence of mild cardiogenic pulmonary edema.  Last echo done 08/08/2019 with normal systolic function and grade 2 diastolic dysfunction.  There is also concern for possible PE given recent admission for right femur fracture.  SARS-CoV-2 antigen and PCR test negative. -Continue BiPAP at this time, repeat ABG in a few hours -Continue supplemental oxygen as needed  to maintain SPO2 above 88% -Received IV Lasix 40 mg in the ED.  Unable to give additional Lasix at this time due to persistent hypotension.  Order Lasix in the morning if blood pressure and renal function permit. -Empiric heparin drip given concern for possible PE.  Unable to do CT angiogram due to renal insufficiency.  Obtain VQ scan and bilateral lower extremity Dopplers in a.m.  Chronic hypotension Infectious etiology less likely given no fever or leukocytosis.  Chest x-ray not suggestive of pneumonia.  Currently AAO x3.  Suspect hypotension is due to right ventricular failure.  Recent echo showing severely reduced right ventricular function with dilated cardiomyopathy. -Continue home midodrine -Check procalcitonin level -Check UA -Blood culture x2  Addendum: PCCM recommending starting empiric antibiotics for coverage of possible HCAP.  End-stage Group 1 and 3 PAH -Continue home macitentan, tadalafil, Malvin Johns -Outpatient follow-up with pulmonology  COPD -Stable.  No evidence of acute exacerbation. -Continue home inhalers  AKI Creatinine 2.2 at present.  Creatinine ranging between 2.0-2.5 on labs done during recent hospitalization and GFR <30.  Renal function was normal a year ago.  Etiology could be prerenal due to low blood pressure versus cardiorenal from acutely decompensated CHF. -Received Lasix as above -Continue to monitor renal function -Avoid nephrotoxic agents/contrast -Monitor urine output  DVT prophylaxis: Heparin Code Status: DNR/DNI.  Confirmed with the patient. Family Communication: No family available at this time. Disposition Plan: Anticipate discharge after clinical improvement. Consults called: PCCM Admission status: It is my clinical opinion that admission to INPATIENT is reasonable and necessary in this 73 y.o. male . presenting with Acute on chronic hypoxic respiratory failure secondary to acute on chronic diastolic congestive heart failure and baseline poor  reserve  due to COPD and end-stage PAH, concern for possible PE.  Very high risk of decompensation.  Given the aforementioned, the predictability of an adverse outcome is felt to be significant. I expect that the patient will require at least 2 midnights in the hospital to treat this condition.   The medical decision making on this patient was of high complexity and the patient is at high risk for clinical deterioration, therefore this is a level 3 visit.  Shela Leff MD [facility] Pager [phone]  If 7PM-7AM, please contact night-coverage [email or web address] Password [email or web address]  08/17/2019, 1:55 AM

## 2019-08-17 NOTE — ED Notes (Signed)
Admitting paged to RN per his request

## 2019-08-17 NOTE — ED Notes (Signed)
Spoke with pt's wife and updated her on pt's care. Admitting MD ok with pt having visitor. Informed wife she could come visit if she would like to.

## 2019-08-17 NOTE — ED Notes (Signed)
Spoke with admitting MD- pt's O2 sat goal 85%.

## 2019-08-17 NOTE — Consult Note (Addendum)
Advanced Heart Failure Team Consult Note   Primary Physician: Lawerance Cruel, MD PCP-Cardiologist:  Dr. Haroldine Laws [facility]   Reason for Consultation: Pulmonary HTN  HPI:    [name] is seen today for evaluation of pulmonary HTN at the request of Dr. Sloan Leiter, Internal Medicine.   [name] is a 73 y.o. 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 He was initially referred to the [facility] by Dr. Irish Lack for further evaluation of pulmonary HTN. He was seen by Dr. Haroldine Laws and later referred to [facility], where he has since been followed. His last clinic appt in our clinic was in 2018.   To summarize his history, he developed worsening dyspnea after PNA in 9/16. 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 He saw Dr. Lamonte Sakai and PFTs showed mild to moderate obstruction with +BD response and markedly reduced DLCO (29%). He was started on Symbicort and right heart cath was suggested. Autoimmune labs were done, shows ANA positive at 1:40, a-scl-70 negative. RF negative. With markedly reduced DLCO. Underwent R/L cath with stable CAD and moderate to severe PAH (see below).   Initially felt to have Group I PAH (with PH out of proportion to COPD). Prior VQ negative for PE. Hi res CT with moderate to severe COPD despite relatively normal spirometry on PFTs.    He was evaluated at [facility] and told he was not a candidate for lung transplantation. Given only mildly reduced spirometry they questioned whether or not PAH may be primary issue vs parenchymal lung disease. This has been the ongoing issue. CT scans had demonstrated relatively severe COPD and he has severe exertional hypoxemia but PFTs don't support. He was being treated w/ macitentan and tolerating well but we were hesitant to add a second agent due to risk of shunting in  setting of COPD. He was subsequently referred to [facility] to further discuss.  Per Care Everywhere, he had recent [place] at [facility] 05/26/19 for worsening symptoms. Results as follows:   RA 8, PA 72/38 mean 52, wedge 9 Cardiac output 3.2L/min with index of 1.8. PV 13.3 WU  PTA he was on Opsumit and sildenafil and on chronic home O2, 2L/min baseline. Also recently started on Selexipag.  He had recent admit 12/8-12/20 after mechanical fall resulting in rt subcapital femur fx. Evaluated by ortho and conservative management recommended.  He presented back to the [facility] ED 12/17 via EMS for acute hypoxic respiratory failure due to cor pulmonale requiring BiPAP. This is c/b hypotension limiting IV diureics and he requires midodrine for BP support. BNP 3,447. COVID negative. CXR w/ pulmonary edema. V/Q scan today demonstrated large regional/multi segmental perfusion defect in the right upper lobe is most consistent with acute pulmonary embolism. LE venous doppler negative for DVT. Now on IV heparin. SBPs in the 70s-80s. Felt to be nearing end of life. Primary team involving palliative care but asked for AHF consult to see if anything left to offer from a [facility] standpoint.   Studies:  [facility] 3/18:  Mild nonobstructive CAD on left. CTO RCA with left to right and right to right colatterals (unchanged since 2005) RA 4 RV 78/4 PA 79/23 (43) PCW 4 Fick 5.2/2.5 Mild response to BDs  PFTs 1/18 FEV1  2.61 (75%) FVC    4.15 (  87%) DLCO 24%  VQ: 4/18  Diminished perfusion in the RIGHT upper lobe corresponding to emphysematous changes on CT.  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  Echo 2/18: EF 65-70% Grade I DD RV mildly dilated with normal function. No septal flattening (reviewed personally)  CXR 12/02/16: Normal (reviewed personally)  Review of Systems: [y] = yes, _0  = no   . General: Weight gain _1 ; Weight  loss _2 ; Anorexia _3 ; Fatigue _4 ; Fever _5 ; Chills _6 ; Weakness _7   . Cardiac: Chest pain/pressure _8 ; Resting SOB _9 ; Exertional SOB _10 ; Orthopnea _11 ; Pedal Edema _12 ; Palpitations _13 ; Syncope _14 ; Presyncope _15 ; Paroxysmal nocturnal dyspnea_16   . Pulmonary: Cough _17 ; Wheezing_18 ; Hemoptysis_19 ; Sputum _20 ; Snoring _21   . GI: Vomiting_22 ; Dysphagia_23 ; Melena_24 ; Hematochezia _25 ; Heartburn_26 ; Abdominal pain _27 ; Constipation _28 ; Diarrhea _29 ; BRBPR _30   . GU: Hematuria_31 ; Dysuria _32 ; Nocturia_33   . Vascular: Pain in legs with walking _34 ; Pain in feet with lying flat _35 ; Non-healing sores _36 ; Stroke _37 ; TIA _38 ; Slurred speech _39 ;  . Neuro: Headaches_40 ; Vertigo_41 ; Seizures_42 ; Paresthesias_43 ;Blurred vision _44 ; Diplopia _45 ; Vision changes _46   . Ortho/Skin: Arthritis _47 ; Joint pain _48 ; Muscle pain _49 ; Joint swelling _50 ; Back Pain _51 ; Rash _52   . Psych: Depression_53 ; Anxiety_54   . Heme: Bleeding problems _55 ; Clotting disorders _56 ; Anemia _57   . Endocrine: Diabetes _58 ; Thyroid dysfunction_59   Home Medications Prior to Admission medications   Medication Sig Start Date End Date Taking? Authorizing Provider  ammonium lactate (AMLACTIN) 12 % cream Apply 1 application topically daily as needed for dry skin. 05/16/19  Yes [provider]  aspirin 81 MG tablet Take 81 mg by mouth every evening.    Yes [provider]  Azelastine-Fluticasone (DYMISTA) 137-50 MCG/ACT SUSP Place 1-2 sprays into the nose daily as needed. Patient taking differently: Place 1-2 sprays into the nose daily as needed (allergies).  12/08/18  Yes Parrett, Tammy S, NP  clonazePAM (KLONOPIN) 0.5 MG tablet Take 1 tablet (0.5 mg total) by mouth 3 (three) times daily as needed for up to 15 doses for anxiety. 08/10/19  Yes Amin, Ankit Chirag, MD  Coenzyme Q10 (VITALINE COQ10) 100 MG WAFR Take 60 mg by mouth daily.   Yes [provider]  DULoxetine (CYMBALTA) 60 MG capsule Take 60  mg by mouth daily.   Yes [provider]  feeding supplement (BOOST / RESOURCE BREEZE) LIQD Take 1 Container by mouth 3 (three) times daily between meals.   Yes [provider]  Fluticasone-Umeclidin-Vilant (TRELEGY ELLIPTA) 100-62.5-25 MCG/INH AEPB Inhale 1 puff into the lungs daily. 06/14/19  Yes Parrett, Tammy S, NP  ipratropium (ATROVENT) 0.06 % nasal spray Place 2 sprays into both nostrils 4 (four) times daily as needed for rhinitis. 04/19/18  Yes Juanito Doom, MD  Lactobacillus (ACIDOPHILUS PO) Take 1 capsule by mouth daily.    Yes [provider]  Macitentan (OPSUMIT) 10 MG TABS Take 1 tablet (10 mg total) by mouth daily. 01/21/17  Yes Sabriah Hobbins, Shaune Pascal, MD  midodrine (PROAMATINE) 10 MG tablet Take 5 mg by mouth 3 (three) times daily.    Yes [provider]  mirtazapine (REMERON) 15 MG tablet Take 15 mg by mouth at bedtime.   Yes [provider]  Multiple Vitamins-Minerals (CENTRUM CARDIO PO) Take 1 tablet by mouth daily.    Yes [provider]  rosuvastatin (CRESTOR) 40 MG tablet Take 1 tablet (40 mg total) by mouth daily. Please make overdue appt with Dr. Irish Lack before anymore refills. 2nd attempt 07/07/18  Yes Jettie Booze, MD  tadalafil (CIALIS) 20 MG tablet Take 40 mg by mouth daily.    Yes [provider]  triamcinolone cream (KENALOG) 0.1 % Apply 1 application topically daily as needed for irritation. 05/16/19  Yes [provider]  UPTRAVI 200 MCG TABS Take 400-600 mcg by mouth 2 (two) times daily. Take 2 tablets in the morning and 3 tablets at night. 06/19/19  Yes [provider]  zinc oxide 20 % ointment Apply 1 application topically. To buttocks after every incontinent episode and as needed for redness. May keep at bedside. As Needed   Yes [provider]    Past Medical History: Past Medical History:  Diagnosis Date  . CAD (coronary artery disease)   . GAD (generalized  anxiety disorder)   . Hyperlipidemia   . Overweight(278.02)   . Pulmonary hypertension ([place])     Past Surgical History: Past Surgical History:  Procedure Laterality Date  . ANAL FISSURE REPAIR    . CARDIAC CATHETERIZATION  2005  . RIGHT/LEFT HEART CATH AND CORONARY ANGIOGRAPHY N/A 11/24/2016   Procedure: Right/Left Heart Cath and Coronary Angiography;  Surgeon: Jettie Booze, MD;  Location: [facility];  Service: Cardiovascular;  Laterality: N/A;  . SKIN CANCER EXCISION    . TONSILLECTOMY  1955  . UMBILICAL HERNIA REPAIR  10/07/2011   Procedure: HERNIA REPAIR UMBILICAL ADULT;  Surgeon: Rolm Bookbinder, MD;  Location: [facility];  Service: General;  Laterality: N/A;  with mesh     Family History: Family History  Problem Relation Age of Onset  . Cancer Sister        breast  . Heart disease Father   . Heart attack Father   . Stroke Father     Social History: Social History   Socioeconomic History  . Marital status: Married    Spouse name: Not on file  . Number of children: Not on file  . Years of education: Not on file  . Highest education level: Not on file  Occupational History  . Not on file  Tobacco Use  . Smoking status: Former Smoker    Packs/day: 1.00    Years: 18.00    Pack years: 18.00    Quit date: 09/27/1985    Years since quitting: 33.9  . Smokeless tobacco: Never Used  Substance and Sexual Activity  . Alcohol use: Yes  . Drug use: No  . Sexual activity: Not on file  Other Topics Concern  . Not on file  Social History Narrative  . Not on file   Social Determinants of Health   Financial Resource Strain:   . Difficulty of Paying Living Expenses: Not on file  Food Insecurity:   . Worried About Charity fundraiser in the Last Year: Not on file  . Ran Out of Food in the Last Year: Not on file  Transportation Needs:   . Lack of Transportation (Medical): Not on file  . Lack of Transportation (Non-Medical): Not on file  Physical Activity:    . Days of Exercise per Week: Not on file  .  Minutes of Exercise per Session: Not on file  Stress:   . Feeling of Stress : Not on file  Social Connections:   . Frequency of Communication with Friends and Family: Not on file  . Frequency of Social Gatherings with Friends and Family: Not on file  . Attends Religious Services: Not on file  . Active Member of Clubs or Organizations: Not on file  . Attends Archivist Meetings: Not on file  . Marital Status: Not on file    Allergies:  Allergies  Allergen Reactions  . Penicillins Hives    Has patient had a PCN reaction causing immediate rash, facial/tongue/throat swelling, SOB or lightheadedness with hypotension: No Has patient had a PCN reaction causing severe rash involving mucus membranes or skin necrosis: Yes Has patient had a PCN reaction that required hospitalization Unknown Has patient had a PCN reaction occurring within the last 10 years: No If all of the above answers are "NO", then may proceed with Cephalosporin use.   . Nitrates, Organic   . Symbicort [Budesonide-Formoterol Fumarate] Other (See Comments) and Cough    Severe cough    Objective:    Vital Signs:   Temp:  [98 F (36.7 C)] 98 F (36.7 C) (12/17 0151) Pulse Rate:  [33-107] 103 (12/17 1503) Resp:  [10-22] 10 (12/17 1503) BP: (72-105)/(51-80) 105/75 (12/17 1345) SpO2:  [62 %-95 %] 91 % (12/17 1503) FiO2 (%):  [100 %] 100 % (12/17 0809)    Weight change: There were no vitals filed for this visit.  Intake/Output:   Intake/Output Summary (Last 24 hours) at 08/17/2019 1629 Last data filed at 08/17/2019 1600 Gross per 24 hour  Intake 751.85 ml  Output 1000 ml  Net -248.15 ml      Physical Exam    General:  Chronically ill WM resp difficulty requiring nonrebreather HEENT: ears cyanotic  Neck: supple. JVP . Carotids 2+ bilat; no bruits. No lymphadenopathy or thyromegaly appreciated. Cor: PMI nondisplaced. Regular rate & rhythm. No rubs,  gallops or murmurs. Lungs: clear Abdomen: soft, nontender, nondistended. No hepatosplenomegaly. No bruits or masses. Good bowel sounds. Extremities: no cyanosis, clubbing, rash, edema Neuro: alert & orientedx3, cranial nerves grossly intact. moves all 4 extremities w/o difficulty. Affect pleasant   Telemetry   Sinus tach w/ frequent PVCS  EKG    NSR 98 bpm w/ PVCs and PACs  Labs   Basic Metabolic Panel: Recent Labs  Lab 08/10/2019 2118 08/11/2019 2319 08/17/19 0358 08/17/19 0420 08/17/19 0554  NA 136 136 139 138 136  K 4.1 4.2 4.4 4.1 4.3  CL  --  108 109  --  110  CO2  --   --  16*  --  15*  GLUCOSE  --  110* 122*  --  122*  BUN  --  90* 100*  --  101*  CREATININE  --  2.20* 2.18*  --  2.06*  CALCIUM  --   --  8.0*  --  7.7*  MG  --   --   --   --  2.7*    Liver Function Tests: Recent Labs  Lab 08/17/19 0554  AST 25  ALT 21  ALKPHOS 86  BILITOT 1.2  PROT 4.8*  ALBUMIN 2.4*   No results for input(s): LIPASE, AMYLASE in the last 168 hours. No results for input(s): AMMONIA in the last 168 hours.  CBC: Recent Labs  Lab 08/02/2019 1641 08/27/2019 2118 08/05/2019 2319 08/17/19 0420 08/17/19 0554  WBC 8.7  --   --   --  5.8  NEUTROABS 7.4  --   --   --   --   HGB 15.8 16.7 16.7 15.3 14.0  HCT 47.8 49.0 49.0 45.0 42.6  MCV 92.5  --   --   --  93.4  PLT PLATELET CLUMPS NOTED ON SMEAR, UNABLE TO ESTIMATE  --   --   --  97*    Cardiac Enzymes: No results for input(s): CKTOTAL, CKMB, CKMBINDEX, TROPONINI in the last 168 hours.  BNP: BNP (last 3 results) Recent Labs    08/08/19 0245 08/10/19 0258 08/26/2019 1641  BNP 1,046.6* 1,631.7* 3,447.1*    ProBNP (last 3 results) Recent Labs    08/19/18 0936  PROBNP 659.0*     CBG: No results for input(s): GLUCAP in the last 168 hours.  Coagulation Studies: No results for input(s): LABPROT, INR in the last 72 hours.   Imaging   NM Pulmonary Perfusion  Result Date: 08/17/2019 CLINICAL DATA:  PE  suspected, history of COPD/emphysema and into in mom, his EXAM: NUCLEAR MEDICINE PERFUSION LUNG SCAN TECHNIQUE: Perfusion images were obtained in multiple projections after intravenous injection of radiopharmaceutical. Ventilation scans intentionally deferred if perfusion scan and chest x-ray adequate for interpretation during COVID 19 epidemic. RADIOPHARMACEUTICALS:  1.65 mCi Tc-47mMAA IV COMPARISON:  Radiograph 08/06/2019 FINDINGS: Multiple large segmental defects are seen in the anterior, posterior and apical segments of the right upper lobe. No other large segmental filling defects. Fairly uniform perfusion throughout the remaining segments of the lungs. Normal cardiac shadow is seen. Evaluation of the concurrent chest radiography reveals no parenchymal cause for this underlying fusion defect. IMPRESSION: Large regional/multi segmental perfusion defect in the right upper lobe is most consistent with acute pulmonary embolism. Currently attempting to contact the ordering provider with this critical value. Addendum will be submitted upon case discussion. Electronically Signed   By: PLovena LeM.D.   On: 08/17/2019 16:18   DG Chest Port 1 View  Result Date: 08/09/2019 CLINICAL DATA:  Hypotension, hypoxia EXAM: PORTABLE CHEST 1 VIEW COMPARISON:  08/08/2019 chest radiograph. FINDINGS: Stable cardiomediastinal silhouette with mild cardiomegaly. No pneumothorax. No pleural effusion. Mild diffuse prominence of the parahilar linear interstitial markings. IMPRESSION: Mild cardiomegaly with new mild diffuse prominence of the linear parahilar interstitial markings, favoring mild cardiogenic pulmonary edema. Electronically Signed   By: JIlona SorrelM.D.   On: 08/05/2019 17:06   VAS [place]OWER EXTREMITY VENOUS (DVT)  Result Date: 08/17/2019  Lower Venous Study Indications: SOB.  Limitations: Body habitus and patient position. Comparison Study: 08/08/2019- bilateral negative lower extremity venous duplex. Performing  Technologist: MMaudry MayhewMHA, RDMS, RVT, RDCS  Examination Guidelines: A complete evaluation includes B-mode imaging, spectral Doppler, color Doppler, and power Doppler as needed of all accessible portions of each vessel. Bilateral testing is considered an integral part of a complete examination. Limited examinations for reoccurring indications may be performed as noted.  +---------+---------------+---------+-----------+----------+--------------+ RIGHT    CompressibilityPhasicitySpontaneityPropertiesThrombus Aging +---------+---------------+---------+-----------+----------+--------------+ CFV      Full           No       Yes                                 +---------+---------------+---------+-----------+----------+--------------+ FV Prox  Full                                                        +---------+---------------+---------+-----------+----------+--------------+  FV Mid   Full                                                        +---------+---------------+---------+-----------+----------+--------------+ FV DistalFull                                                        +---------+---------------+---------+-----------+----------+--------------+ PFV      Full                                                        +---------+---------------+---------+-----------+----------+--------------+ POP      Full           No       Yes                                 +---------+---------------+---------+-----------+----------+--------------+ PTV      Full                                                        +---------+---------------+---------+-----------+----------+--------------+ PERO     Full                                                        +---------+---------------+---------+-----------+----------+--------------+   +---------+---------------+---------+-----------+----------+--------------+ LEFT      CompressibilityPhasicitySpontaneityPropertiesThrombus Aging +---------+---------------+---------+-----------+----------+--------------+ CFV      Full           No       Yes                                 +---------+---------------+---------+-----------+----------+--------------+ FV Prox  Full                                                        +---------+---------------+---------+-----------+----------+--------------+ FV Mid   Full                                                        +---------+---------------+---------+-----------+----------+--------------+ FV DistalFull                                                        +---------+---------------+---------+-----------+----------+--------------+  PFV      Full                                                        +---------+---------------+---------+-----------+----------+--------------+ POP      Full           No       Yes                                 +---------+---------------+---------+-----------+----------+--------------+ PTV      Full                                                        +---------+---------------+---------+-----------+----------+--------------+ PERO     Full                                                        +---------+---------------+---------+-----------+----------+--------------+  Summary: Right: There is no evidence of deep vein thrombosis in the lower extremity. However, portions of this examination were limited- see technologist comments above. No cystic structure found in the popliteal fossa. Left: There is no evidence of deep vein thrombosis in the lower extremity. However, portions of this examination were limited- see technologist comments above. No cystic structure found in the popliteal fossa. Bilateral lower extremity veins exhibit pulsatile flow, suggestive of possible elevated right heart pressure.  *See table(s) above for measurements and  observations. Electronically signed by Servando Snare MD on 08/17/2019 at 3:37:19 PM.    Final       Medications:     Current Medications: . arformoterol  15 mcg Nebulization BID  . budesonide (PULMICORT) nebulizer solution  0.5 mg Nebulization BID  . ipratropium  0.5 mg Nebulization Q6H  . macitentan  10 mg Oral Daily  . midodrine  10 mg Oral TID WC  . Selexipag  400-600 mcg Oral BID  . tadalafil (PAH)  40 mg Oral Daily     Infusions: . ceFEPime (MAXIPIME) IV Stopped (08/17/19 0447)  . heparin 1,300 Units/hr (08/17/19 1451)  . [START ON September 15, 2019] vancomycin         Patient Profile   73 y.o. male with a h/o morbid obesity (s/p extensive weight loss), previous smoker quit in the 80s) Raynaud's disease and CAD with a known CTO of the RCA (Cath 2005) and severe PH, admitted for acute on chronic hypoxic respiratory failure. V/Q scan c/w acute PE.  Assessment/Plan   1. Acute on Chronic Hypoxic Respiratory Failure: 2/2 severe PH and COPD + Acute PE. He was recently started on Selexipag and suspect that this led to shunting in setting of COPD resulting in worsening respiratory failure. Dr. Haroldine Laws to evaluate and plans to discuss further w/ his [facility] at [facility]. He is currently on nonrebreather and now on IV heparin for PE.   2. End Stage Pulmonary HTN: per above. Slexipag has been discontinued. Plan palliative care consult to discuss A[place]ia  3. PE: V/Q scan shows  large regional/multi segmental perfusion defect in the right upper lobe is most consistent with acute pulmonary embolism. This is in the setting of severe PH, not on a/c previously. Also w/ recent fall w/ known rt subcapital femur fx 08/08/19, treated conservatively. LE venous dopplers show no evidence of deep vein thrombosis in the lower extremity. - plan to continue IV heparin with ultimate bridge to oral agent.   4. CKD Stage IV: SCr 2.0 c/w baseline   Length of Stay: 0  Brittainy Simmons, PA-C  08/17/2019, 4:29  PM  Advanced Heart Failure Team Pager [phone] (M-F; 7a - 4p)  Please contact [facility] for night-coverage after hours (4p -7a ) and weekends on [email or web address]  Patient seen and examined with the above-signed Advanced Practice Provider and/or Housestaff. I personally reviewed laboratory data, imaging studies and relevant notes. I independently examined the patient and formulated the important aspects of the plan. I have edited the note to reflect any of my changes or salient points. I have personally discussed the plan with the patient and/or family.  73 y/o male with end-stage lung disease and PAH. He has been followed closely at [facility].and been turned down for lung transplant. He is now followed by Dr. Christa See in the [facility]. He has been maintained on macitentan and tadalfil with NYHA IV symptoms. Several weeks ago started on trial of selexipag. Since that time has felt weaker and more SOB. Had recent admission 08/08/19 through 08/10/19 for dyspnea and fall.  He suffered right subcapital femur fx and was evaluated by ortho who recommended conservative management.  LE dopplers were negative for PE at the time.  He was admitted and continued on home [place] therapies and was  Discharged to [place].  Retured to ED 12/17 with worsening dyspnea.  CXR with cardiomegaly and pulmonary vascular congestion.  He was placed on NRB and ABG demonstrated significant hypoxemic respiratory failure (7.4 / 28 / 43).  BNP 3,477.  COVID pending. He was placed on BiPAP due to hypoxia and his work of breathing.   VQ showed large regional/multi segmental perfusion defect in the right upper lobe is most consistent with acute pulmonary embolism.  Now requiring NRB to keep sats in high 80s-low 90s.   On exam he is weak appearing and looks terminally ill Diffusely cyanotic and cachetic JVP 10  Cor regular distant Lungs markedly reduced breath sounds  No wheeze Ab soft Nd Ext warm  cyanotic cachetic  He has end-stage lung disease that is multifactorial time course of worsening seems to correspond to addition of prostanoids and now c/b fall with large PE. I think bot of these are worsening his shunting  I d/w Dr. Christa See at [facility] (at Mr. Jeanella Cara request) We will stop selexipag. Anticoagulate for PE.   I think he needs Hospice but he remains resistant.   CRITICAL CARE Performed by: Glori Bickers  Total critical care time: 35 minutes  Critical care time was exclusive of separately billable procedures and treating other patients.  Critical care was necessary to treat or prevent imminent or life-threatening deterioration.  Critical care was time spent personally by me (independent of midlevel providers or residents) on the following activities: development of treatment plan with patient and/or surrogate as well as nursing, discussions with consultants, evaluation of patient's response to treatment, examination of patient, obtaining history from patient or surrogate, ordering and performing treatments and interventions, ordering and review of laboratory studies, ordering and review of radiographic studies, pulse  oximetry and re-evaluation of patient's condition.  Glori Bickers, MD  8:09 PM

## 2019-08-17 NOTE — Progress Notes (Signed)
ANTICOAGULATION CONSULT NOTE - Initial Consult  Pharmacy Consult for Heparin  Indication: Rule out PE  Allergies  Allergen Reactions  . Penicillins Hives    Has patient had a PCN reaction causing immediate rash, facial/tongue/throat swelling, SOB or lightheadedness with hypotension: No Has patient had a PCN reaction causing severe rash involving mucus membranes or skin necrosis: Yes Has patient had a PCN reaction that required hospitalization Unknown Has patient had a PCN reaction occurring within the last 10 years: No If all of the above answers are "NO", then may proceed with Cephalosporin use.   . Nitrates, Organic   . Symbicort [Budesonide-Formoterol Fumarate] Other (See Comments) and Cough    Severe cough    Patient Measurements: 68 kg  Vital Signs: BP: 90/68 (12/17 0030) Pulse Rate: 103 (12/17 0030)  Labs: Recent Labs    08/28/2019 1641 08/01/2019 2118 08/11/2019 2319  HGB 15.8 16.7 16.7  HCT 47.8 49.0 49.0  PLT PLATELET CLUMPS NOTED ON SMEAR, UNABLE TO ESTIMATE  --   --   CREATININE  --   --  2.20*    Estimated Creatinine Clearance: 28.8 mL/min (A) (by C-G formula based on SCr of 2.2 mg/dL (H)).   Medical History: Past Medical History:  Diagnosis Date  . CAD (coronary artery disease)   . GAD (generalized anxiety disorder)   . Hyperlipidemia   . Overweight(278.02)   . Pulmonary hypertension [facility])     Assessment: 73 y/o M with persistent hypoxemia and hypotension. Hx pulmonary hypertension. To begin empiric treatment with heparin while ruling out PE. H/H good. Noted renal dysfunction.   Goal of Therapy:  Heparin level 0.3-0.7 units/ml Monitor platelets by anticoagulation protocol: Yes   Plan:  Heparin 4000 units BOLUS Start heparin drip at 1100 units/hr 0930 HL Daily CBC/HL Monitor for bleeding  Narda Bonds, PharmD, BCPS Clinical Pharmacist Phone: [phone]

## 2019-08-18 ENCOUNTER — Inpatient Hospital Stay (HOSPITAL_COMMUNITY): Payer: Medicare Other

## 2019-08-18 DIAGNOSIS — I2699 Other pulmonary embolism without acute cor pulmonale: Secondary | ICD-10-CM

## 2019-08-18 DIAGNOSIS — I5033 Acute on chronic diastolic (congestive) heart failure: Secondary | ICD-10-CM

## 2019-08-18 DIAGNOSIS — I2609 Other pulmonary embolism with acute cor pulmonale: Principal | ICD-10-CM

## 2019-08-18 DIAGNOSIS — I959 Hypotension, unspecified: Secondary | ICD-10-CM

## 2019-08-18 DIAGNOSIS — Z515 Encounter for palliative care: Secondary | ICD-10-CM

## 2019-08-18 DIAGNOSIS — J431 Panlobular emphysema: Secondary | ICD-10-CM

## 2019-08-18 LAB — CBC WITH DIFFERENTIAL/PLATELET
Abs Immature Granulocytes: 0.02 10*3/uL (ref 0.00–0.07)
Basophils Absolute: 0 10*3/uL (ref 0.0–0.1)
Basophils Relative: 0 %
Eosinophils Absolute: 0 10*3/uL (ref 0.0–0.5)
Eosinophils Relative: 0 %
HCT: 49.6 % (ref 39.0–52.0)
Hemoglobin: 16.3 g/dL (ref 13.0–17.0)
Immature Granulocytes: 0 %
Lymphocytes Relative: 8 %
Lymphs Abs: 0.5 10*3/uL — ABNORMAL LOW (ref 0.7–4.0)
MCH: 30.8 pg (ref 26.0–34.0)
MCHC: 32.9 g/dL (ref 30.0–36.0)
MCV: 93.6 fL (ref 80.0–100.0)
Monocytes Absolute: 0.7 10*3/uL (ref 0.1–1.0)
Monocytes Relative: 12 %
Neutro Abs: 5 10*3/uL (ref 1.7–7.7)
Neutrophils Relative %: 80 %
Platelets: 161 10*3/uL (ref 150–400)
RBC: 5.3 MIL/uL (ref 4.22–5.81)
RDW: 18.1 % — ABNORMAL HIGH (ref 11.5–15.5)
WBC: 6.3 10*3/uL (ref 4.0–10.5)
nRBC: 0.5 % — ABNORMAL HIGH (ref 0.0–0.2)

## 2019-08-18 LAB — PHOSPHORUS: Phosphorus: 5.7 mg/dL — ABNORMAL HIGH (ref 2.5–4.6)

## 2019-08-18 LAB — COMPREHENSIVE METABOLIC PANEL
ALT: 22 U/L (ref 0–44)
AST: 31 U/L (ref 15–41)
Albumin: 2.9 g/dL — ABNORMAL LOW (ref 3.5–5.0)
Alkaline Phosphatase: 93 U/L (ref 38–126)
Anion gap: 15 (ref 5–15)
BUN: 107 mg/dL — ABNORMAL HIGH (ref 8–23)
CO2: 17 mmol/L — ABNORMAL LOW (ref 22–32)
Calcium: 8.5 mg/dL — ABNORMAL LOW (ref 8.9–10.3)
Chloride: 110 mmol/L (ref 98–111)
Creatinine, Ser: 2.28 mg/dL — ABNORMAL HIGH (ref 0.61–1.24)
GFR calc Af Amer: 32 mL/min — ABNORMAL LOW (ref >60)
GFR calc non Af Amer: 27 mL/min — ABNORMAL LOW (ref >60)
Glucose, Bld: 96 mg/dL (ref 70–99)
Potassium: 4.5 mmol/L (ref 3.5–5.1)
Sodium: 142 mmol/L (ref 135–145)
Total Bilirubin: 1.5 mg/dL — ABNORMAL HIGH (ref 0.3–1.2)
Total Protein: 5.6 g/dL — ABNORMAL LOW (ref 6.5–8.1)

## 2019-08-18 LAB — MAGNESIUM: Magnesium: 3.1 mg/dL — ABNORMAL HIGH (ref 1.7–2.4)

## 2019-08-18 LAB — HEPARIN LEVEL (UNFRACTIONATED): Heparin Unfractionated: 0.43 IU/mL (ref 0.30–0.70)

## 2019-08-18 MED ORDER — HALOPERIDOL LACTATE 2 MG/ML PO CONC
0.5000 mg | ORAL | Status: DC | PRN
  Filled 2019-08-18: qty 0.3

## 2019-08-18 MED ORDER — ALUM & MAG HYDROXIDE-SIMETH 200-200-20 MG/5ML PO SUSP
30.0000 mL | ORAL | Status: DC | PRN
  Administered 2019-08-18: 03:00:00 30 mL via ORAL
  Filled 2019-08-18: qty 30

## 2019-08-18 MED ORDER — LORAZEPAM 2 MG/ML IJ SOLN
1.0000 mg | INTRAMUSCULAR | Status: DC | PRN
  Administered 2019-08-18: 17:00:00 1 mg via INTRAVENOUS
  Filled 2019-08-18: qty 1

## 2019-08-18 MED ORDER — MIDODRINE HCL 5 MG PO TABS
5.0000 mg | ORAL_TABLET | Freq: Once | ORAL | Status: AC
  Administered 2019-08-18: 03:00:00 5 mg via ORAL
  Filled 2019-08-18: qty 1

## 2019-08-18 MED ORDER — FLEET ENEMA 7-19 GM/118ML RE ENEM: 1.0000 | ENEMA | Freq: Once | RECTAL | Status: DC

## 2019-08-18 MED ORDER — ONDANSETRON HCL 4 MG/2ML IJ SOLN: 4.0000 mg | Freq: Four times a day (QID) | INTRAMUSCULAR | Status: DC | PRN

## 2019-08-18 MED ORDER — HYDROMORPHONE BOLUS VIA INFUSION
1.0000 mg | INTRAVENOUS | Status: DC | PRN
  Administered 2019-08-18: 17:00:00 2 mg via INTRAVENOUS
  Filled 2019-08-18: qty 2

## 2019-08-18 MED ORDER — GLYCOPYRROLATE 1 MG PO TABS: 1.0000 mg | ORAL_TABLET | ORAL | Status: DC | PRN

## 2019-08-18 MED ORDER — MORPHINE SULFATE (PF) 2 MG/ML IV SOLN
INTRAVENOUS | Status: AC
  Administered 2019-08-18: 09:00:00 2 mg via INTRAVENOUS
  Filled 2019-08-18: qty 1

## 2019-08-18 MED ORDER — SODIUM CHLORIDE 0.9 % IV SOLN
0.5000 mg/h | INTRAVENOUS | Status: DC
  Administered 2019-08-18: 0.5 mg/h via INTRAVENOUS
  Filled 2019-08-18: qty 5

## 2019-08-18 MED ORDER — HALOPERIDOL 0.5 MG PO TABS
0.5000 mg | ORAL_TABLET | ORAL | Status: DC | PRN
  Filled 2019-08-18: qty 1

## 2019-08-18 MED ORDER — LORAZEPAM 2 MG/ML PO CONC: 1.0000 mg | ORAL | Status: DC | PRN

## 2019-08-18 MED ORDER — LORAZEPAM 2 MG/ML IJ SOLN
0.5000 mg | Freq: Four times a day (QID) | INTRAMUSCULAR | Status: DC
  Administered 2019-08-18: 13:00:00 0.5 mg via INTRAVENOUS
  Filled 2019-08-18: qty 1

## 2019-08-18 MED ORDER — LORAZEPAM 2 MG/ML IJ SOLN
0.5000 mg | Freq: Once | INTRAMUSCULAR | Status: AC
  Administered 2019-08-18: 06:00:00 0.5 mg via INTRAVENOUS
  Filled 2019-08-18: qty 1

## 2019-08-18 MED ORDER — LORAZEPAM 1 MG PO TABS: 1.0000 mg | ORAL_TABLET | ORAL | Status: DC | PRN

## 2019-08-18 MED ORDER — POLYVINYL ALCOHOL 1.4 % OP SOLN
1.0000 [drp] | Freq: Four times a day (QID) | OPHTHALMIC | Status: DC | PRN
  Administered 2019-08-18: 13:00:00 1 [drp] via OPHTHALMIC
  Filled 2019-08-18: qty 15

## 2019-08-18 MED ORDER — BIOTENE DRY MOUTH MT LIQD: 15.0000 mL | OROMUCOSAL | Status: DC | PRN

## 2019-08-18 MED ORDER — ONDANSETRON 4 MG PO TBDP
4.0000 mg | ORAL_TABLET | Freq: Four times a day (QID) | ORAL | Status: DC | PRN
  Filled 2019-08-18: qty 1

## 2019-08-18 MED ORDER — GLYCOPYRROLATE 0.2 MG/ML IJ SOLN: 0.2000 mg | INTRAMUSCULAR | Status: DC | PRN

## 2019-08-18 MED ORDER — HYDROCODONE-ACETAMINOPHEN 5-325 MG PO TABS
1.0000 | ORAL_TABLET | Freq: Four times a day (QID) | ORAL | Status: DC | PRN
  Administered 2019-08-18: 03:00:00 2 via ORAL
  Filled 2019-08-18: qty 2

## 2019-08-18 MED ORDER — IPRATROPIUM BROMIDE 0.02 % IN SOLN: 0.5000 mg | Freq: Four times a day (QID) | RESPIRATORY_TRACT | Status: DC | PRN

## 2019-08-18 MED ORDER — MORPHINE SULFATE (PF) 2 MG/ML IV SOLN: 2.0000 mg | Freq: Once | INTRAVENOUS | Status: AC

## 2019-08-18 MED ORDER — HALOPERIDOL LACTATE 5 MG/ML IJ SOLN: 0.5000 mg | INTRAMUSCULAR | Status: DC | PRN

## 2019-08-22 LAB — CULTURE, BLOOD (ROUTINE X 2)
Culture: NO GROWTH
Culture: NO GROWTH
Special Requests: ADEQUATE

## 2019-08-24 ENCOUNTER — Inpatient Hospital Stay: Payer: Medicare Other | Admitting: Primary Care

## 2019-09-01 NOTE — Progress Notes (Signed)
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  Initially reported 50% on 6 L oxygen by nasal cannula.  Recently admitted for dyspnea and fall.  Recently suffered from right subcapital femoral fracture and recommended conservative management.  COVID-19 test was negative.  Initially required 40 L oxygen by high flow nasal cannula.   Admitted to the hospital with multiple intervention with no improvement. VQ scan with right upper lobe PE.   Assessment & Plan:   Principal Problem:   Acute on chronic respiratory failure with hypoxia (HCC) Active Problems:   Hypotension   AKI (acute kidney injury) ([place])   Acute exacerbation of CHF (congestive heart failure) (HCC)   COPD (chronic obstructive pulmonary disease) (HCC)  Acute on chronic hypoxemic respiratory failure: Probably due to cor-pulmonale with underlying severe end-stage pulmonary arterial hypertension. Treated with oxygen, antibiotics, heparin infusion for right upper lobe pulmonary embolism. Patient continued to be requiring high oxygen, unable to maintain on 100% FiO2 with BiPAP. Patient nearing death and desire to comfort care measures.  Plan: Patient remains in very poor clinical situation, distressed, air hunger with pain and suffering even after optimal treatment. Discussed with family and patient, decided comfort care measures. DNR/DNI/comfort care No escalation of care No benefit with antibiotics and steroids, will discontinue Continue heparin for treatment of embolism for time being. All symptom: Medications including benzodiazepines,, pain medications available.  Antisecretory  medications. Patient will probably die in the hospital. Liberalize visitation policy. Appreciate palliative team input. RN to pronounce death if happens.  DVT prophylaxis: Heparin.  Comfort Code Status: DNR/DNI comfort care Family Communication: Wife at the bedside.  Family pastor at bedside Disposition Plan: Unknown.  Anticipate hospital death.   Consultants:   Cardiology  Pulmonology  Procedures:   None  Antimicrobials:   None   Subjective: Patient seen and examined.  Since admission he has remained in very serious condition, unable to maintain saturations with 100% oxygen.  Abdomen hurts.  Chest hurts.  Feels suffocated.  Objective: Vitals:   2019/09/09 0906 2019/09/09 0909 09-09-19 0911 September 09, 2019 1235  BP:      Pulse:    (!) 104  Resp:    19  Temp:      TempSrc:      SpO2: 90% (!) 86% (!) 86% (!) 86%    Intake/Output Summary (Last 24 hours) at 09/09/19 1402 Last data filed at September 09, 2019 0647 Gross per 24 hour  Intake 660 ml  Output --  Net 660 ml   There were no vitals filed for this visit.  Examination: Physical Exam  Constitutional:  Severely distressed on BiPAP in the morning, anxious and restless.  HENT:  Head: Normocephalic.  Eyes: Pupils are equal, round, and reactive to light.  Cardiovascular:  Tachycardic  Pulmonary/Chest:  Poor air entry bilateral lungs  Abdominal: Bowel sounds are normal.  Musculoskeletal:     Cervical back: Neck supple.  Neurological:  Distress, lethargic, anxious and restless       Data Reviewed: I have personally reviewed following labs and imaging studies  CBC: Recent Labs  Lab 08/31/2019 1641 08/10/2019 2118 08/20/2019 2319 08/17/19 0420 08/17/19 0554 09/09/19 0349  WBC 8.7  --   --   --  5.8 6.3  NEUTROABS 7.4  --   --   --   --  5.0  HGB 15.8 16.7 16.7 15.3 14.0 16.3  HCT 47.8 49.0 49.0 45.0 42.6 49.6  MCV 92.5  --   --   --  93.4 93.6  PLT PLATELET CLUMPS NOTED ON SMEAR, UNABLE TO ESTIMATE  --   --    --  97* 941   Basic Metabolic Panel: Recent Labs  Lab 08/13/2019 2319 08/17/19 0358 08/17/19 0420 08/17/19 0554 09/03/19 0349  NA 136 139 138 136 142  K 4.2 4.4 4.1 4.3 4.5  CL 108 109  --  110 110  CO2  --  16*  --  15* 17*  GLUCOSE 110* 122*  --  122* 96  BUN 90* 100*  --  101* 107*  CREATININE 2.20* 2.18*  --  2.06* 2.28*  CALCIUM  --  8.0*  --  7.7* 8.5*  MG  --   --   --  2.7* 3.1*  PHOS  --   --   --   --  5.7*   GFR: Estimated Creatinine Clearance: 27.8 mL/min (A) (by C-G formula based on SCr of 2.28 mg/dL (H)). Liver Function Tests: Recent Labs  Lab 08/17/19 0554 2019/09/03 0349  AST 25 31  ALT 21 22  ALKPHOS 86 93  BILITOT 1.2 1.5*  PROT 4.8* 5.6*  ALBUMIN 2.4* 2.9*   No results for input(s): LIPASE, AMYLASE in the last 168 hours. No results for input(s): AMMONIA in the last 168 hours. Coagulation Profile: No results for input(s): INR, PROTIME in the last 168 hours. Cardiac Enzymes: No results for input(s): CKTOTAL, CKMB, CKMBINDEX, TROPONINI in the last 168 hours. BNP (last 3 results) Recent Labs    08/19/18 0936  PROBNP 659.0*   HbA1C: No results for input(s): HGBA1C in the last 72 hours. CBG: No results for input(s): GLUCAP in the last 168 hours. Lipid Profile: No results for input(s): CHOL, HDL, LDLCALC, TRIG, CHOLHDL, LDLDIRECT in the last 72 hours. Thyroid Function Tests: No results for input(s): TSH, T4TOTAL, FREET4, T3FREE, THYROIDAB in the last 72 hours. Anemia Panel: No results for input(s): VITAMINB12, FOLATE, FERRITIN, TIBC, IRON, RETICCTPCT in the last 72 hours. Sepsis Labs: Recent Labs  Lab 08/17/19 0358  PROCALCITON 0.25    Recent Results (from the past 240 hour(s))  SARS CORONAVIRUS 2 (TAT 6-24 HRS) Nasopharyngeal Nasopharyngeal Swab     Status: None   Collection Time: 08/02/2019  6:28 PM   Specimen: Nasopharyngeal Swab  Result Value Ref Range Status   SARS Coronavirus 2 NEGATIVE NEGATIVE Final    Comment: (NOTE) SARS-CoV-2  target nucleic acids are NOT DETECTED. The SARS-CoV-2 RNA is generally detectable in upper and lower respiratory specimens during the acute phase of infection. Negative results do not preclude SARS-CoV-2 infection, do not rule out co-infections with other pathogens, and should not be used as the sole basis for treatment or other patient management decisions. Negative results must be combined with clinical observations, patient history, and epidemiological information. The expected result is Negative. Fact Sheet for Patients: SugarRoll.be Fact Sheet for Healthcare Providers: https://www.woods-mathews.com/ This test is not yet approved or cleared by the [place] FDA and  has been authorized for detection and/or diagnosis of SARS-CoV-2 by FDA under an Emergency Use Authorization (EUA). This EUA will remain  in effect (meaning this test can be used) for the duration of the COVID-19 declaration under Section 56 4(b)(1) of the Act, 21 U.S.C. section  360bbb-3(b)(1), unless the authorization is terminated or revoked sooner. Performed at [facility], [place] [address], [place], [place] [address]          Radiology Studies: NM Pulmonary Perfusion  Addendum Date: 08/17/2019   ADDENDUM REPORT: 08/17/2019 16:31 ADDENDUM: These results were called by telephone at the time of interpretation on 08/17/2019 at 4:30 pm to provider Dr Barb Merino, who verbally acknowledged these results. Electronically Signed   By: Lovena Le M.D.   On: 08/17/2019 16:31   Result Date: 08/17/2019 CLINICAL DATA:  PE suspected, history of COPD/emphysema and into in mom, his EXAM: NUCLEAR MEDICINE PERFUSION LUNG SCAN TECHNIQUE: Perfusion images were obtained in multiple projections after intravenous injection of radiopharmaceutical. Ventilation scans intentionally deferred if perfusion scan and chest x-ray adequate for interpretation during COVID 19 epidemic.  RADIOPHARMACEUTICALS:  1.65 mCi Tc-52mMAA IV COMPARISON:  Radiograph 08/01/2019 FINDINGS: Multiple large segmental defects are seen in the anterior, posterior and apical segments of the right upper lobe. No other large segmental filling defects. Fairly uniform perfusion throughout the remaining segments of the lungs. Normal cardiac shadow is seen. Evaluation of the concurrent chest radiography reveals no parenchymal cause for this underlying fusion defect. IMPRESSION: Large regional/multi segmental perfusion defect in the right upper lobe is most consistent with acute pulmonary embolism. Currently attempting to contact the ordering provider with this critical value. Addendum will be submitted upon case discussion. Electronically Signed: By: PLovena LeM.D. On: 08/17/2019 16:18   DG Chest Port 1 View  Result Date: 08/14/2019 CLINICAL DATA:  Hypotension, hypoxia EXAM: PORTABLE CHEST 1 VIEW COMPARISON:  08/08/2019 chest radiograph. FINDINGS: Stable cardiomediastinal silhouette with mild cardiomegaly. No pneumothorax. No pleural effusion. Mild diffuse prominence of the parahilar linear interstitial markings. IMPRESSION: Mild cardiomegaly with new mild diffuse prominence of the linear parahilar interstitial markings, favoring mild cardiogenic pulmonary edema. Electronically Signed   By: JIlona SorrelM.D.   On: 08/03/2019 17:06   DG Abd Portable 1V  Result Date: 1Jan 07, 2021CLINICAL DATA:  Left-sided abdominal pain. EXAM: PORTABLE ABDOMEN - 1 VIEW COMPARISON:  None. FINDINGS: Air-filled stomach with question of gastric wall thickening. No small bowel dilatation. Stool distends the rectum, with only minimal stool throughout the remainder the colon. No radiopaque calculi or soft tissue calcifications. No acute osseous abnormalities are seen. IMPRESSION: Air-filled stomach with question of gastric wall thickening, query gastritis. Electronically Signed   By: MKeith RakeM.D.   On: [id number]   VAS [place] LOWER EXTREMITY VENOUS (DVT)  Result Date: 08/17/2019  Lower Venous Study Indications: SOB.  Limitations: Body habitus and patient position. Comparison Study: 08/08/2019- bilateral negative lower extremity venous duplex. Performing Technologist: MMaudry MayhewMHA, RDMS, RVT, RDCS  Examination Guidelines: A complete evaluation includes B-mode imaging, spectral Doppler, color Doppler, and power Doppler as needed of all accessible portions of each vessel. Bilateral testing is considered an integral part of a complete examination. Limited examinations for reoccurring indications may be performed as noted.  +---------+---------------+---------+-----------+----------+--------------+ RIGHT    CompressibilityPhasicitySpontaneityPropertiesThrombus Aging +---------+---------------+---------+-----------+----------+--------------+ CFV      Full           No       Yes                                 +---------+---------------+---------+-----------+----------+--------------+ FV Prox  Full                                                        +---------+---------------+---------+-----------+----------+--------------+  FV Mid   Full                                                        +---------+---------------+---------+-----------+----------+--------------+ FV DistalFull                                                        +---------+---------------+---------+-----------+----------+--------------+ PFV      Full                                                        +---------+---------------+---------+-----------+----------+--------------+ POP      Full           No       Yes                                 +---------+---------------+---------+-----------+----------+--------------+ PTV      Full                                                        +---------+---------------+---------+-----------+----------+--------------+ PERO     Full                                                         +---------+---------------+---------+-----------+----------+--------------+   +---------+---------------+---------+-----------+----------+--------------+ LEFT     CompressibilityPhasicitySpontaneityPropertiesThrombus Aging +---------+---------------+---------+-----------+----------+--------------+ CFV      Full           No       Yes                                 +---------+---------------+---------+-----------+----------+--------------+ FV Prox  Full                                                        +---------+---------------+---------+-----------+----------+--------------+ FV Mid   Full                                                        +---------+---------------+---------+-----------+----------+--------------+ FV DistalFull                                                        +---------+---------------+---------+-----------+----------+--------------+  PFV      Full                                                        +---------+---------------+---------+-----------+----------+--------------+ POP      Full           No       Yes                                 +---------+---------------+---------+-----------+----------+--------------+ PTV      Full                                                        +---------+---------------+---------+-----------+----------+--------------+ PERO     Full                                                        +---------+---------------+---------+-----------+----------+--------------+  Summary: Right: There is no evidence of deep vein thrombosis in the lower extremity. However, portions of this examination were limited- see technologist comments above. No cystic structure found in the popliteal fossa. Left: There is no evidence of deep vein thrombosis in the lower extremity. However, portions of this examination were limited- see technologist comments above. No cystic  structure found in the popliteal fossa. Bilateral lower extremity veins exhibit pulsatile flow, suggestive of possible elevated right heart pressure.  *See table(s) above for measurements and observations. Electronically signed by Servando Snare MD on 08/17/2019 at 3:37:19 PM.    Final         Scheduled Meds: . ipratropium  0.5 mg Nebulization TID  . LORazepam  0.5 mg Intravenous Q6H  . sodium phosphate  1 enema Rectal Once   Continuous Infusions: . heparin 1,500 Units/hr (08/17/19 2130)  . HYDROmorphone 0.5 mg/hr (2019-09-12 1217)     LOS: 1 day    Time spent: 30 minutes    Barb Merino, MD [facility] Pager [phone]

## 2019-09-01 NOTE — Progress Notes (Signed)
RT NOTE: RT arrived bedside and patient has increase WOB even on bipap. Patient is using accessory muscles so RT has left pateint on bipap. Pateint receiving high tidal volumes even on minimal bipap settings. RT made RN aware and RN agreed. RT will continue to monitor.

## 2019-09-01 NOTE — Progress Notes (Signed)
   Note plans for comfort care. I think this is completely appropriate given the severity of his underlying illness, progressive decline and current suffering.   The HF team will sign off. Please call or page if we can provide support.   Glori Bickers, MD  5:20 PM

## 2019-09-01 NOTE — Progress Notes (Signed)
Pt with desats  To 60% on bipap, generalized ashy colored skin, cool extremities, agitated, positioned to sitting position with improvement to low 80s, MD notified with new orders for morphine 56m, family notifed of patient condition.

## 2019-09-01 NOTE — Progress Notes (Signed)
[place] for Heparin  Indication: pulmonary embolus  Allergies  Allergen Reactions  . Penicillins Hives    Has patient had a PCN reaction causing immediate rash, facial/tongue/throat swelling, SOB or lightheadedness with hypotension: No Has patient had a PCN reaction causing severe rash involving mucus membranes or skin necrosis: Yes Has patient had a PCN reaction that required hospitalization Unknown Has patient had a PCN reaction occurring within the last 10 years: No If all of the above answers are "NO", then may proceed with Cephalosporin use.   . Nitrates, Organic   . Symbicort [Budesonide-Formoterol Fumarate] Other (See Comments) and Cough    Severe cough    Patient Measurements: 68 kg  Vital Signs: BP: 101/71 (12/18 0500) Pulse Rate: 53 (12/18 0500)  Labs: Recent Labs    08/23/2019 1641 08/07/2019 2118 08/15/2019 2319 08/17/19 0358 08/17/19 0420 08/17/19 0554 08/17/19 1026 08/17/19 2000 Sep 13, 2019 0349  HGB 15.8  --   --   --  15.3 14.0  --   --  16.3  HCT 47.8  --   --   --  45.0 42.6  --   --  49.6  PLT PLATELET CLUMPS NOTED ON SMEAR, UNABLE TO ESTIMATE  --   --   --   --  97*  --   --  161  HEPARINUNFRC  --    < >   < >  --   --   --  0.10* 0.23* 0.43  CREATININE  --    < >  --  2.18*  --  2.06*  --   --  2.28*   < > = values in this interval not displayed.    Estimated Creatinine Clearance: 27.8 mL/min (A) (by C-G formula based on SCr of 2.28 mg/dL (H)).   Medical History: Past Medical History:  Diagnosis Date  . CAD (coronary artery disease)   . GAD (generalized anxiety disorder)   . Hyperlipidemia   . Overweight(278.02)   . Pulmonary hypertension [facility])     Assessment: 74 y/o M with persistent hypoxemia and hypotension. Hx pulmonary hypertension. To begin empiric treatment with heparin while ruling out PE.  Heparin level is therapeutic this morning at 0.4 on 1500 units/hr. Hgb 16, plt are normal at 161. No s/sx of  bleeding or infusion issues.   VQ scan is positive for PE.   Goal of Therapy:  Heparin level 0.3-0.7 units/ml Monitor platelets by anticoagulation protocol: Yes   Plan:  Continue heparin drip at 1500 units/hr Follow-up confirmatory level now then daily  Erin Hearing PharmD., BCPS Clinical Pharmacist 2019-09-13 12:17 PM

## 2019-09-01 NOTE — Progress Notes (Signed)
Patient c/o 9/10 abdominal pain not relieved by Tylenol. He was also noted with increased anxiety. On call Bodenheimer, NP made aware. Orders received for Hydrocodone, Midodrine 5 mg x 1, abdominal xray and Lorazepam 0,5 mg x 1. Hydrocodone given was not effective.

## 2019-09-01 NOTE — Progress Notes (Signed)
RT called to bedside due to patients sats dropping in the 70's.  RT placed patient on BiPAP 100% FiO2 sats not in the mid 90's.  Patient complaining of sharp pain in abdomen.  Rapid response notified.  Rapid response and RT at bedside.

## 2019-09-01 NOTE — Progress Notes (Signed)
Called to pts room by family member, no resp or pulses noted, verified by charge nurse Vernie Shanks, time of death 35, family at bedside, Dr Sloan Leiter and [facility] palliative notified, pt desired to have his body donated to [facility] for science, wishes were conveyed to [place] [facility].

## 2019-09-01 NOTE — Death Summary Note (Signed)
DEATH SUMMARY   Patient Details  Name: [name] MRN: [id number] DOB: 05-02-1946  Admission/Discharge Information   Admit Date:  2019-09-13  Date of Death: Date of Death: (P) Sep 15, 2019  Time of Death: Time of Death: (P) 1759  Length of Stay: 1  Referring Physician: Lawerance Cruel, MD   Reason(s) for Hospitalization  Acute on chronic respiratory failure with hypoxia due to severe pulmonary arterial hypertension  Diagnoses  Preliminary cause of death:  Secondary Diagnoses (including complications and co-morbidities):  Principal Problem:   Acute on chronic respiratory failure with hypoxia ([place]) Active Problems:   Hypotension   AKI (acute kidney injury) ([place])   Acute exacerbation of CHF (congestive heart failure) ([place])   COPD (chronic obstructive pulmonary disease) ([place])   Pulmonary embolus ([place])   Palliative care encounter   Hospice care patient   Brief Hospital Course (including significant findings, care, treatment, and services provided and events leading to death)  [name] is a 74 y.o. 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  Recently had suffered a subcapital femoral fracture that was treated conservatively.  Since admission to the hospital, he remained on very high oxygen requirement, VQ scan was consistent with right upper lobe pulmonary embolism. Patient's oxygen demand keep worsening, he is clinically status deteriorated, with all treatments, patient did not do good clinical recovery and became encephalopathic with lot of respiratory distress.  At this point, patient and family agreed to initiate comfort care measures and patient was provided supportive comfort care treatment and patient diet peacefully with family at the bedside on 09-15-2019 at  1759.    Pertinent Labs and Studies  Significant Diagnostic Studies CT HEAD WO CONTRAST  Result Date: 08/10/2019 CLINICAL DATA:  Syncope. Fall. EXAM: CT HEAD WITHOUT CONTRAST TECHNIQUE: Contiguous axial images were obtained from the base of the skull through the vertex without intravenous contrast. COMPARISON:  None. FINDINGS: Brain: Mild cortical atrophy. Negative for hydrocephalus. Negative for acute infarct, hemorrhage, mass. Vascular: Negative for hyperdense vessel Skull: Negative Sinuses/Orbits: Negative Other: None IMPRESSION: Mild atrophy. No acute abnormality. Electronically Signed   By: Franchot Gallo M.D.   On: 08/10/2019 10:20   NM Pulmonary Perfusion  Addendum Date: 08/17/2019   ADDENDUM REPORT: 08/17/2019 16:31 ADDENDUM: These results were called by telephone at the time of interpretation on 08/17/2019 at 4:30 pm to provider Dr Barb Merino, who verbally acknowledged these results. Electronically Signed   By: Lovena Le M.D.   On: 08/17/2019 16:31   Result Date: 08/17/2019 CLINICAL DATA:  PE suspected, history of COPD/emphysema and into in mom, his EXAM: NUCLEAR MEDICINE PERFUSION LUNG SCAN TECHNIQUE: Perfusion images were obtained in multiple projections after intravenous injection of radiopharmaceutical. Ventilation scans intentionally deferred if perfusion scan and chest x-ray adequate for interpretation during COVID 19 epidemic. RADIOPHARMACEUTICALS:  1.65 mCi Tc-72mMAA IV COMPARISON:  Radiograph 1Jan 13, 2021FINDINGS: Multiple large segmental defects are seen in the anterior, posterior and apical segments of the right upper lobe. No other large segmental filling defects. Fairly uniform perfusion throughout the remaining segments of the lungs. Normal cardiac shadow is seen. Evaluation of the concurrent chest radiography reveals no parenchymal cause for this underlying fusion defect. IMPRESSION: Large regional/multi segmental perfusion defect in the right upper lobe is most  consistent with acute pulmonary embolism. Currently attempting to contact  the ordering provider with this critical value. Addendum will be submitted upon case discussion. Electronically Signed: By: Lovena Le M.D. On: 08/17/2019 16:18   CT HIP RIGHT WO CONTRAST  Result Date: 08/08/2019 CLINICAL DATA:  Right hip pain secondary to a fall. EXAM: CT OF THE RIGHT HIP WITHOUT CONTRAST TECHNIQUE: Multidetector CT imaging of the right hip was performed according to the standard protocol. Multiplanar CT image reconstructions were also generated. COMPARISON:  Radiographs dated 08/08/2019 FINDINGS: Bones/Joint/Cartilage There is a very subtle subcapital fracture of the right femoral neck with no displacement. The visualized pelvic bones are intact. Muscles and Tendons Normal. Soft tissues Extensive sigmoid diverticulosis. Slight edema in the subcutaneous fat of the buttocks and proximal right femur, indeterminate. IMPRESSION: Subtle subcapital fracture of the right femoral neck. Electronically Signed   By: Lorriane Shire M.D.   On: 08/08/2019 10:01   DG Chest Port 1 View  Result Date: 08/08/2019 CLINICAL DATA:  Hypotension, hypoxia EXAM: PORTABLE CHEST 1 VIEW COMPARISON:  08/08/2019 chest radiograph. FINDINGS: Stable cardiomediastinal silhouette with mild cardiomegaly. No pneumothorax. No pleural effusion. Mild diffuse prominence of the parahilar linear interstitial markings. IMPRESSION: Mild cardiomegaly with new mild diffuse prominence of the linear parahilar interstitial markings, favoring mild cardiogenic pulmonary edema. Electronically Signed   By: Ilona Sorrel M.D.   On: 08/29/2019 17:06   DG Chest Port 1 View  Result Date: 08/08/2019 CLINICAL DATA:  Shortness of breath. EXAM: PORTABLE CHEST 1 VIEW COMPARISON:  04/28/2017 FINDINGS: Cardiomegaly, new from prior exam. There is a globular configuration of the cardiopericardial silhouette. Atherosclerosis of the thoracic aorta. No focal airspace disease. No  evidence of pulmonary edema. Mild emphysema, apical predominant. No pleural effusion or pneumothorax. No acute osseous abnormalities are seen. IMPRESSION: 1. Cardiomegaly, progressed from 2018. Slightly globular configuration of the cardiopericardial silhouette, which can be seen with underlying pericardial effusion. Consider echocardiogram. 2. Emphysema. Aortic Atherosclerosis (ICD10-I70.0) and Emphysema (ICD10-J43.9). Electronically Signed   By: Keith Rake M.D.   On: 08/08/2019 02:55   DG Abd Portable 1V  Result Date: 28-Aug-2019 CLINICAL DATA:  Left-sided abdominal pain. EXAM: PORTABLE ABDOMEN - 1 VIEW COMPARISON:  None. FINDINGS: Air-filled stomach with question of gastric wall thickening. No small bowel dilatation. Stool distends the rectum, with only minimal stool throughout the remainder the colon. No radiopaque calculi or soft tissue calcifications. No acute osseous abnormalities are seen. IMPRESSION: Air-filled stomach with question of gastric wall thickening, query gastritis. Electronically Signed   By: Keith Rake M.D.   On: 2019/08/28 06:36   ECHOCARDIOGRAM COMPLETE  Result Date: 08/08/2019   ECHOCARDIOGRAM REPORT   Patient Name:   LADERIUS VALBUENA Date of Exam: 08/08/2019 Medical Rec #:  [id number]      Height:       71.0 in Accession #:    [id number]     Weight:       165.3 lb Date of Birth:  1945/10/11      BSA:          1.94 m Patient Age:    32 years       BP:           80/63 mmHg Patient Gender: M              HR:           71 bpm. Exam Location:  Inpatient Procedure: 2D Echo Indications:    Pulmonary Embolus I26.99  History:        Patient has prior history  of Echocardiogram examinations, most                 recent 05/27/2018. CAD, Pulmonary HTN; Risk Factors:Dyslipidemia.  Sonographer:    Mikki Santee RDCS (AE) Referring Phys: [place]  1. Left ventricular ejection fraction, by visual estimation, is 60 to 65%. The left ventricle has normal function.  There is mildly increased left ventricular hypertrophy.  2. The left ventricle has no regional wall motion abnormalities.  3. Left ventricular diastolic parameters are consistent with Grade II diastolic dysfunction (pseudonormalization).  4. Global right ventricle has severely reduced systolic function.The right ventricular size is severely enlarged. No increase in right ventricular wall thickness. D-shaped interventricular septum suggestive of RV pressure/volume overload.  5. The tricuspid valve is normal in structure. Tricuspid valve regurgitation is severe. Systolic flow reversal on the hepatic vein doppler pattern.  6. The aortic valve is tricuspid. Aortic valve regurgitation is not visualized. Mild aortic valve sclerosis without stenosis.  7. Left atrial size was moderately dilated.  8. Right atrial size was severely dilated.  9. The mitral valve is normal in structure. Trace mitral valve regurgitation. No evidence of mitral stenosis. 10. The inferior vena cava is dilated in size with <50% respiratory variability, suggesting right atrial pressure of 15 mmHg. 11. The tricuspid regurgitant velocity is 3.79 m/s, and with an assumed right atrial pressure of 15 mmHg, the estimated right ventricular systolic pressure is severely elevated at 72.5 mmHg. 12. Trivial pericardial effusion is present. FINDINGS  Left Ventricle: Left ventricular ejection fraction, by visual estimation, is 60 to 65%. The left ventricle has normal function. The left ventricle has no regional wall motion abnormalities. The left ventricular internal cavity size was the left ventricle is normal in size. There is mildly increased left ventricular hypertrophy. Left ventricular diastolic parameters are consistent with Grade II diastolic dysfunction (pseudonormalization). Right Ventricle: The right ventricular size is severely enlarged. No increase in right ventricular wall thickness. Global RV systolic function is has severely reduced systolic  function. The tricuspid regurgitant velocity is 3.79 m/s, and with an assumed right atrial pressure of 15 mmHg, the estimated right ventricular systolic pressure is severely elevated at 72.5 mmHg. Left Atrium: Left atrial size was moderately dilated. Right Atrium: Right atrial size was severely dilated Pericardium: Trivial pericardial effusion is present. Mitral Valve: The mitral valve is normal in structure. No evidence of mitral valve stenosis by observation. Trace mitral valve regurgitation. Tricuspid Valve: The tricuspid valve is normal in structure. Tricuspid valve regurgitation is severe. Aortic Valve: The aortic valve is tricuspid. Aortic valve regurgitation is not visualized. Mild aortic valve sclerosis is present, with no evidence of aortic valve stenosis. Pulmonic Valve: The pulmonic valve was normal in structure. Pulmonic valve regurgitation is not visualized. Aorta: The aortic root is normal in size and structure. Venous: The inferior vena cava is dilated in size with less than 50% respiratory variability, suggesting right atrial pressure of 15 mmHg. IAS/Shunts: No atrial level shunt detected by color flow Doppler.  LEFT VENTRICLE PLAX 2D LVIDd:         4.16 cm  Diastology LVIDs:         2.52 cm  LV e' lateral:   7.95 cm/s LV PW:         0.96 cm  LV E/e' lateral: 5.7 LV IVS:        0.83 cm  LV e' medial:    4.95 cm/s LVOT diam:     2.50 cm  LV E/e' medial:  9.2 LV SV:         54 ml LV SV Index:   27.87 LVOT Area:     4.91 cm  RIGHT VENTRICLE RV S prime:     5.18 cm/s TAPSE (M-mode): 1.0 cm LEFT ATRIUM             Index       RIGHT ATRIUM           Index LA diam:        5.00 cm 2.57 cm/m  RA Area:     33.00 cm LA Vol (A2C):   46.2 ml 23.76 ml/m RA Volume:   125.00 ml 64.28 ml/m LA Vol (A4C):   87.0 ml 44.74 ml/m LA Biplane Vol: 69.2 ml 35.58 ml/m  AORTIC VALVE LVOT Vmax:   58.10 cm/s LVOT Vmean:  35.200 cm/s LVOT VTI:    0.110 m  AORTA Ao Root diam: 3.50 cm MITRAL VALVE                         TRICUSPID VALVE MV Area (PHT): 4.31 cm             TR Peak grad:   57.5 mmHg MV PHT:        51.04 msec           TR Vmax:        379.00 cm/s MV Decel Time: 176 msec MV E velocity: 45.40 cm/s 103 cm/s  SHUNTS MV A velocity: 42.80 cm/s 70.3 cm/s Systemic VTI:  0.11 m MV E/A ratio:  1.06       1.5       Systemic Diam: 2.50 cm  Loralie Champagne MD Electronically signed by Loralie Champagne MD Signature Date/Time: 08/08/2019/3:36:21 PM    Final    DG Hip Unilat W or Wo Pelvis 2-3 Views Right  Result Date: 08/08/2019 CLINICAL DATA:  Fall with right hip pain EXAM: DG HIP (WITH OR WITHOUT PELVIS) 2-3V RIGHT COMPARISON:  None. FINDINGS: Limited assessment of the right femoral neck due to inward rotation. No definite fracture, although there is subtle cortical overlapping at the lateral femoral neck. No dislocation. Osteopenia. IMPRESSION: Subtle cortical irregularity at the lateral femoral neck, not definite for fracture. Depending on suspicion for fracture a CT or MRI could be obtained. Electronically Signed   By: Monte Fantasia M.D.   On: 08/08/2019 07:57   VAS [place] LOWER EXTREMITY VENOUS (DVT)  Result Date: 08/17/2019  Lower Venous Study Indications: SOB.  Limitations: Body habitus and patient position. Comparison Study: 08/08/2019- bilateral negative lower extremity venous duplex. Performing Technologist: Maudry Mayhew MHA, RDMS, RVT, RDCS  Examination Guidelines: A complete evaluation includes B-mode imaging, spectral Doppler, color Doppler, and power Doppler as needed of all accessible portions of each vessel. Bilateral testing is considered an integral part of a complete examination. Limited examinations for reoccurring indications may be performed as noted.  +---------+---------------+---------+-----------+----------+--------------+ RIGHT    CompressibilityPhasicitySpontaneityPropertiesThrombus Aging +---------+---------------+---------+-----------+----------+--------------+ CFV      Full           No        Yes                                 +---------+---------------+---------+-----------+----------+--------------+ FV Prox  Full                                                        +---------+---------------+---------+-----------+----------+--------------+  FV Mid   Full                                                        +---------+---------------+---------+-----------+----------+--------------+ FV DistalFull                                                        +---------+---------------+---------+-----------+----------+--------------+ PFV      Full                                                        +---------+---------------+---------+-----------+----------+--------------+ POP      Full           No       Yes                                 +---------+---------------+---------+-----------+----------+--------------+ PTV      Full                                                        +---------+---------------+---------+-----------+----------+--------------+ PERO     Full                                                        +---------+---------------+---------+-----------+----------+--------------+   +---------+---------------+---------+-----------+----------+--------------+ LEFT     CompressibilityPhasicitySpontaneityPropertiesThrombus Aging +---------+---------------+---------+-----------+----------+--------------+ CFV      Full           No       Yes                                 +---------+---------------+---------+-----------+----------+--------------+ FV Prox  Full                                                        +---------+---------------+---------+-----------+----------+--------------+ FV Mid   Full                                                        +---------+---------------+---------+-----------+----------+--------------+ FV DistalFull                                                         +---------+---------------+---------+-----------+----------+--------------+  PFV      Full                                                        +---------+---------------+---------+-----------+----------+--------------+ POP      Full           No       Yes                                 +---------+---------------+---------+-----------+----------+--------------+ PTV      Full                                                        +---------+---------------+---------+-----------+----------+--------------+ PERO     Full                                                        +---------+---------------+---------+-----------+----------+--------------+  Summary: Right: There is no evidence of deep vein thrombosis in the lower extremity. However, portions of this examination were limited- see technologist comments above. No cystic structure found in the popliteal fossa. Left: There is no evidence of deep vein thrombosis in the lower extremity. However, portions of this examination were limited- see technologist comments above. No cystic structure found in the popliteal fossa. Bilateral lower extremity veins exhibit pulsatile flow, suggestive of possible elevated right heart pressure.  *See table(s) above for measurements and observations. Electronically signed by Servando Snare MD on 08/17/2019 at 3:37:19 PM.    Final    VAS [place] LOWER EXTREMITY VENOUS (DVT)  Result Date: 08/08/2019  Lower Venous Study Indications: Edema.  Comparison Study: no prior Performing Technologist: Abram Sander RVS  Examination Guidelines: A complete evaluation includes B-mode imaging, spectral Doppler, color Doppler, and power Doppler as needed of all accessible portions of each vessel. Bilateral testing is considered an integral part of a complete examination. Limited examinations for reoccurring indications may be performed as noted.  +---------+---------------+---------+-----------+----------+--------------+ RIGHT     CompressibilityPhasicitySpontaneityPropertiesThrombus Aging +---------+---------------+---------+-----------+----------+--------------+ CFV      Full           Yes      Yes                                 +---------+---------------+---------+-----------+----------+--------------+ SFJ      Full                                                        +---------+---------------+---------+-----------+----------+--------------+ FV Prox  Full                                                        +---------+---------------+---------+-----------+----------+--------------+  FV Mid   Full                                                        +---------+---------------+---------+-----------+----------+--------------+ FV DistalFull                                                        +---------+---------------+---------+-----------+----------+--------------+ PFV      Full                                                        +---------+---------------+---------+-----------+----------+--------------+ POP      Full           Yes      Yes                                 +---------+---------------+---------+-----------+----------+--------------+ PTV      Full                                                        +---------+---------------+---------+-----------+----------+--------------+ PERO                                                  Not visualized +---------+---------------+---------+-----------+----------+--------------+   +---------+---------------+---------+-----------+----------+--------------+ LEFT     CompressibilityPhasicitySpontaneityPropertiesThrombus Aging +---------+---------------+---------+-----------+----------+--------------+ CFV      Full           Yes      Yes                                 +---------+---------------+---------+-----------+----------+--------------+ SFJ      Full                                                         +---------+---------------+---------+-----------+----------+--------------+ FV Prox  Full                                                        +---------+---------------+---------+-----------+----------+--------------+ FV Mid   Full                                                        +---------+---------------+---------+-----------+----------+--------------+  FV DistalFull                                                        +---------+---------------+---------+-----------+----------+--------------+ PFV      Full                                                        +---------+---------------+---------+-----------+----------+--------------+ POP      Full           Yes      Yes                                 +---------+---------------+---------+-----------+----------+--------------+ PTV      Full                                                        +---------+---------------+---------+-----------+----------+--------------+ PERO                                                  Not visualized +---------+---------------+---------+-----------+----------+--------------+     Summary: Right: There is no evidence of deep vein thrombosis in the lower extremity. No cystic structure found in the popliteal fossa. Left: There is no evidence of deep vein thrombosis in the lower extremity. No cystic structure found in the popliteal fossa.  *See table(s) above for measurements and observations. Electronically signed by Harold Barban MD on 08/08/2019 at 5:28:13 PM.    Final     Microbiology Recent Results (from the past 240 hour(s))  SARS CORONAVIRUS 2 (TAT 6-24 HRS) Nasopharyngeal Nasopharyngeal Swab     Status: None   Collection Time: 08/30/2019  6:28 PM   Specimen: Nasopharyngeal Swab  Result Value Ref Range Status   SARS Coronavirus 2 NEGATIVE NEGATIVE Final    Comment: (NOTE) SARS-CoV-2 target nucleic acids are NOT DETECTED. The SARS-CoV-2  RNA is generally detectable in upper and lower respiratory specimens during the acute phase of infection. Negative results do not preclude SARS-CoV-2 infection, do not rule out co-infections with other pathogens, and should not be used as the sole basis for treatment or other patient management decisions. Negative results must be combined with clinical observations, patient history, and epidemiological information. The expected result is Negative. Fact Sheet for Patients: SugarRoll.be Fact Sheet for Healthcare Providers: https://www.woods-mathews.com/ This test is not yet approved or cleared by the [place] FDA and  has been authorized for detection and/or diagnosis of SARS-CoV-2 by FDA under an Emergency Use Authorization (EUA). This EUA will remain  in effect (meaning this test can be used) for the duration of the COVID-19 declaration under Section 56 4(b)(1) of the Act, 21 U.S.C. section 360bbb-3(b)(1), unless the authorization is terminated or revoked sooner. Performed at [facility], [place] [address], [place], [place] [address]   Culture, blood (routine x  2)     Status: None (Preliminary result)   Collection Time: 08/17/19  3:58 AM   Specimen: BLOOD  Result Value Ref Range Status   Specimen Description BLOOD LEFT FOREARM  Final   Special Requests   Final    BOTTLES DRAWN AEROBIC AND ANAEROBIC Blood Culture adequate volume   Culture   Final    NO GROWTH 1 DAY Performed at [facility], [place] [address] [place], [place] [address]    Report Status PENDING  Incomplete  Culture, blood (routine x 2)     Status: None (Preliminary result)   Collection Time: 08/17/19  3:59 AM   Specimen: BLOOD  Result Value Ref Range Status   Specimen Description BLOOD LEFT HAND  Final   Special Requests   Final    BOTTLES DRAWN AEROBIC ONLY Blood Culture results may not be optimal due to an inadequate volume of blood received in culture  bottles   Culture   Final    NO GROWTH 1 DAY Performed at [facility], [place] [address], [place], [place] [address]    Report Status PENDING  Incomplete    Lab Basic Metabolic Panel: Recent Labs  Lab 08/19/2019 2319 08/17/19 0358 08/17/19 0420 08/17/19 0554 2019-08-20 0349  NA 136 139 138 136 142  K 4.2 4.4 4.1 4.3 4.5  CL 108 109  --  110 110  CO2  --  16*  --  15* 17*  GLUCOSE 110* 122*  --  122* 96  BUN 90* 100*  --  101* 107*  CREATININE 2.20* 2.18*  --  2.06* 2.28*  CALCIUM  --  8.0*  --  7.7* 8.5*  MG  --   --   --  2.7* 3.1*  PHOS  --   --   --   --  5.7*   Liver Function Tests: Recent Labs  Lab 08/17/19 0554 08-20-19 0349  AST 25 31  ALT 21 22  ALKPHOS 86 93  BILITOT 1.2 1.5*  PROT 4.8* 5.6*  ALBUMIN 2.4* 2.9*   No results for input(s): LIPASE, AMYLASE in the last 168 hours. No results for input(s): AMMONIA in the last 168 hours. CBC: Recent Labs  Lab 08/20/2019 1641 08/12/2019 2118 08/02/2019 2319 08/17/19 0420 08/17/19 0554 08-20-2019 0349  WBC 8.7  --   --   --  5.8 6.3  NEUTROABS 7.4  --   --   --   --  5.0  HGB 15.8 16.7 16.7 15.3 14.0 16.3  HCT 47.8 49.0 49.0 45.0 42.6 49.6  MCV 92.5  --   --   --  93.4 93.6  PLT PLATELET CLUMPS NOTED ON SMEAR, UNABLE TO ESTIMATE  --   --   --  97* 161   Cardiac Enzymes: No results for input(s): CKTOTAL, CKMB, CKMBINDEX, TROPONINI in the last 168 hours. Sepsis Labs: Recent Labs  Lab 08/24/2019 1641 08/17/19 0358 08/17/19 0554 08/20/2019 0349  PROCALCITON  --  0.25  --   --   WBC 8.7  --  5.8 6.3    Procedures/Operations     Barb Merino 08-20-2019, 6:12 PM

## 2019-09-01 NOTE — Progress Notes (Addendum)
Not a rapid response event  I was asked to see [name] due to hypoxia and increased WOB/anxiety. He appears terminally ill and is very anxious and tachypneic despite being on BIPAP 100% Fio2. He is desperately asking for help and afraid to be left alone. We attempted to console him but all efforts are short lived. RN notified [facility] of concerns and awaiting orders. Anxiolytic and/or analgesia would help if BP permits or GOC change.

## 2019-09-01 NOTE — Progress Notes (Signed)
Responded to referral from another unit to assist in family being able to have their pastor visit as patient is nearing EOL and his wife needs support. Spoke with patient's primary physician and his nurse to determine patient is at EOL. Patient is highly agitated and afraid to be alone.   Collaborated with AD of 6E to determine how many support people may be present and how often the pastor can visit.  [place] of [employer] was present in the room; reports there is little family support for patient's wife and wife will likely will name her as one of her primary supports.  Doristine Bosworth has freedom from her duties to be present as needed.  Chaplain noted patient was calm while pastor's hand was on his arm and agitated when she moved away.  Did not speak directly with patient's wife as Palliative Care and doctor were with her.  Passed consult on to next Chaplain and requested this patient be passed on to each subsequent chaplain coming on duty to assess how family and patient are doing.  De Burrs Chaplain Resident

## 2019-09-01 NOTE — Consult Note (Signed)
Consultation Note Date: 2019/08/31   Patient Name: [name]  DOB: 08-16-1946  MRN: [id number]  Age / Sex: 74 y.o., male  PCP: Lawerance Cruel, MD Referring Physician: Barb Merino, MD  Reason for Consultation: Establishing goals of care, Non pain symptom management, Pain control, Psychosocial/spiritual support and Terminal Care  HPI/Patient Profile: 74 y.o. male  with past medical history of end stage pulmonary hypertension, COPD, diastolic heart failure, chronic hypotension and recent right femur fracture (12/8) who was admitted on 08/26/2019 with respiratory failure originally presumed to be from COPD and heart failure.  Was found to have a large right upper lobe PE.  Early on 12/18 am he was having severe abdominal pain and panic attacks.  He is felt to be nearing end of life by the medical team.  Clinical Assessment and Goals of Care:  I have reviewed medical records including EPIC notes, labs and imaging, received report from Dr. Sloan Leiter, [place], assessed the patient and then met at the bedside along with his wife Eldridge Abrahams and we conferenced in his son Rodman Key (They are co-medical powers of attorney) to discuss diagnosis prognosis, GOC, EOL wishes, disposition and options.  We discussed his current illness and what it means in the larger context of her on-going co-morbidities.  Natural disease trajectory and expectations at EOL were discussed.  I attempted to elicit values and goals of care important to the patient.  Per Colette the family just had a "5 Wishes" conversation last evening.  Mikki Santee has stated that he wants to be comfortable.  He expressed fear about dying, but seemed to understand that he was nearing end of life.  As kindly as possible I explained to the family that despite our best efforts to the contrary Mikki Santee is actively dying.  He is currently on BiPAP and  very hypotensive.  His eyes are glazed and not tracking.    I revisited Mikki Santee and Colette 2 more times today.   The second time 2 of his children were present.  We had a discussion about Bob's status and a shift to comfort care.  Colette asked me to return again later in the day.  I went to the room at 4:15.  Colette conferenced on two more children.  We discussed end of life and comfort care.  All questions were answered to the best of my ability.  After the call Mikki Santee received additional anti-anxiety medication and his BiPAP mask was removed.  I stayed with [facility] and the [facility] for approximately 30 minutes after the mask was removed to make sure Mikki Santee was comfortable and had the medications he needed.  We discussed Bob's life and his 32 year marriage to [place].  Supportive listening provided.  Gwen RN provided excellent RN care and Mikki Santee remained very comfortable.  Primary Decision Maker:  NEXT OF KIN wife Colette.    SUMMARY OF RECOMMENDATIONS    Full comfort.  BiPAP removed.  Patient near EOL.  Code Status/Advance Care Planning: DNR  Symptom  Management:   Dilaudid gtt, with PRN boluses for air hunger or discomfort  Ativan for anxiety  Robinul for secretions  Additional Recommendations (Limitations, Scope, Preferences):  Full Comfort Care  Palliative Prophylaxis:   Frequent Pain Assessment  Psycho-social/Spiritual:   Desire for further Chaplaincy support: Patient's Davy Westmoreland has been an Media planner presence all day.  Prognosis:  Minutes to hours.    Discharge Planning: Anticipated Hospital Death      Primary Diagnoses: Present on Admission: . Hypotension   I have reviewed the medical record, interviewed the patient and family, and examined the patient. The following aspects are pertinent.  Past Medical History:  Diagnosis Date  . CAD (coronary artery disease)   . GAD (generalized anxiety disorder)   . Hyperlipidemia   . Overweight(278.02)   .  Pulmonary hypertension ([place])    Social History   Socioeconomic History  . Marital status: Married    Spouse name: Not on file  . Number of children: Not on file  . Years of education: Not on file  . Highest education level: Not on file  Occupational History  . Not on file  Tobacco Use  . Smoking status: Former Smoker    Packs/day: 1.00    Years: 18.00    Pack years: 18.00    Quit date: 09/27/1985    Years since quitting: 33.9  . Smokeless tobacco: Never Used  Substance and Sexual Activity  . Alcohol use: Yes  . Drug use: No  . Sexual activity: Not on file  Other Topics Concern  . Not on file  Social History Narrative  . Not on file   Social Determinants of Health   Financial Resource Strain:   . Difficulty of Paying Living Expenses: Not on file  Food Insecurity:   . Worried About Charity fundraiser in the Last Year: Not on file  . Ran Out of Food in the Last Year: Not on file  Transportation Needs:   . Lack of Transportation (Medical): Not on file  . Lack of Transportation (Non-Medical): Not on file  Physical Activity:   . Days of Exercise per Week: Not on file  . Minutes of Exercise per Session: Not on file  Stress:   . Feeling of Stress : Not on file  Social Connections:   . Frequency of Communication with Friends and Family: Not on file  . Frequency of Social Gatherings with Friends and Family: Not on file  . Attends Religious Services: Not on file  . Active Member of Clubs or Organizations: Not on file  . Attends Archivist Meetings: Not on file  . Marital Status: Not on file   Family History  Problem Relation Age of Onset  . Cancer Sister        breast  . Heart disease Father   . Heart attack Father   . Stroke Father    Scheduled Meds: . ipratropium  0.5 mg Nebulization TID  . LORazepam  0.5 mg Intravenous Q6H  . sodium phosphate  1 enema Rectal Once   Continuous Infusions: . heparin 1,500 Units/hr (08/17/19 2130)  . HYDROmorphone      PRN Meds:.acetaminophen **OR** acetaminophen, alum & mag hydroxide-simeth, antiseptic oral rinse, glycopyrrolate **OR** glycopyrrolate **OR** glycopyrrolate, haloperidol **OR** haloperidol **OR** haloperidol lactate, LORazepam **OR** LORazepam **OR** LORazepam, ondansetron **OR** ondansetron (ZOFRAN) IV, polyvinyl alcohol Allergies  Allergen Reactions  . Penicillins Hives    Has patient had a PCN reaction causing immediate rash, facial/tongue/throat swelling, SOB or lightheadedness  with hypotension: No Has patient had a PCN reaction causing severe rash involving mucus membranes or skin necrosis: Yes Has patient had a PCN reaction that required hospitalization Unknown Has patient had a PCN reaction occurring within the last 10 years: No If all of the above answers are "NO", then may proceed with Cephalosporin use.   . Nitrates, Organic   . Symbicort [Budesonide-Formoterol Fumarate] Other (See Comments) and Cough    Severe cough   Review of Systems unable   Physical Exam  Thin chronically ill appearing male, +pallor, eyes glazed and unfocused, nonverbal to me. CV rrr with systolic murmur Resp agonal breathing off BiPAP Abdomen thin, firm   Vital Signs: BP 101/71   Pulse (!) 53   Temp 97.9 F (36.6 C) (Axillary)   Resp 18   SpO2 (!) 86%  Pain Scale: Faces   Pain Score: 10-Worst pain ever   SpO2: SpO2: (!) 86 % O2 Device:SpO2: (!) 86 % O2 Flow Rate: .O2 Flow Rate (L/min): 15 L/min  IO: Intake/output summary:   Intake/Output Summary (Last 24 hours) at 09/06/19 1106 Last data filed at Sep 06, 2019 1537 Gross per 24 hour  Intake 801.8 ml  Output --  Net 801.8 ml    LBM: Last BM Date: Sep 06, 2019 Baseline Weight:   Most recent weight:       Palliative Assessment/Data: 10%     Time In: 8:00 Time Out: 9:00 Time In 12:00 Time out 12:25 Time in: 4:15 Time out: 5:15 Time Total: 145 min. Visit consisted of counseling and education dealing with the complex and  emotionally intense issues surrounding the need for palliative care and symptom management in the setting of serious and potentially life-threatening illness. Greater than 50%  of this time was spent counseling and coordinating care related to the above assessment and plan.  Signed by: Florentina Jenny, PA-C Palliative Medicine Pager: [phone]  Please contact Palliative Medicine Team phone at [phone] for questions and concerns.  For individual provider: See Shea Evans

## 2019-09-01 DEATH — deceased

## 2020-08-03 IMAGING — NM NM PULMONARY PERF PARTICULATE
8 series · 8 of 8 positions shown · non-contrast
Comparison: Radiograph 08/16/2019
COMPARISON: Radiograph 08/16/2019

Addendum:
CLINICAL DATA: PE suspected, history of COPD/emphysema and into in
mom, his

EXAM:
NUCLEAR MEDICINE PERFUSION LUNG SCAN
TECHNIQUE: Perfusion images were obtained in multiple projections after
intravenous injection of radiopharmaceutical.
Ventilation scans intentionally deferred if perfusion scan and chest
x-ray adequate for interpretation during COVID 19 epidemic.
RADIOPHARMACEUTICALS:  1.65 mCi 7c-UUm MAA IV

[Series 1: ant/post perf · 4.14mm/px · 1 of 1 slices shown (1 of 2)]
[im 1/1]
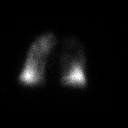

[Series 1: ant/post perf · 4.14mm/px · 1 of 1 slices shown (2 of 2)]
[im 1/1]
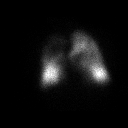

[Series 2: lao/rpo perf · 4.14mm/px · 1 of 1 slices shown (1 of 2)]
[im 1/1]
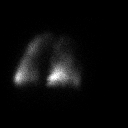

[Series 2: lao/rpo perf · 4.14mm/px · 1 of 1 slices shown (2 of 2)]
[im 1/1]
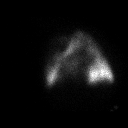

[Series 3: lpo/rao perf · 4.14mm/px · 1 of 1 slices shown (1 of 2)]
[im 1/1]
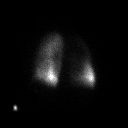

[Series 3: lpo/rao perf · 4.14mm/px · 1 of 1 slices shown (2 of 2)]
[im 1/1]
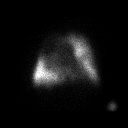

[Series 4: lt lat/rt lat perf · 4.14mm/px · 1 of 1 slices shown (1 of 2)]
[im 1/1]
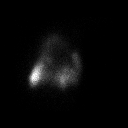

[Series 4: lt lat/rt lat perf · 4.14mm/px · 1 of 1 slices shown (2 of 2)]
[im 1/1]
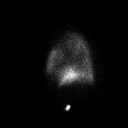

[8 of 8 positions shown; findings below may reference images not displayed]

FINDINGS: Multiple large segmental defects are seen in the anterior, posterior
and apical segments of the right upper lobe. No other large
segmental filling defects. Fairly uniform perfusion throughout the
remaining segments of the lungs. Normal cardiac shadow is seen.
Evaluation of the concurrent chest radiography reveals no
parenchymal cause for this underlying fusion defect.
IMPRESSION: Large regional/multi segmental perfusion defect in the right upper
lobe is most consistent with acute pulmonary embolism.

Currently attempting to contact the ordering provider with this
critical value. Addendum will be submitted upon case discussion.

ADDENDUM:
These results were called by telephone at the time of interpretation
on 08/17/2019 at [DATE] to provider

Dr Rupen Kwatra, who verbally acknowledged these results.

*** End of Addendum ***
FINDINGS: Multiple large segmental defects are seen in the anterior, posterior
and apical segments of the right upper lobe. No other large
segmental filling defects. Fairly uniform perfusion throughout the
remaining segments of the lungs. Normal cardiac shadow is seen.
Evaluation of the concurrent chest radiography reveals no
parenchymal cause for this underlying fusion defect.
IMPRESSION: Large regional/multi segmental perfusion defect in the right upper
lobe is most consistent with acute pulmonary embolism.

Currently attempting to contact the ordering provider with this
critical value. Addendum will be submitted upon case discussion.
